# Patient Record
Sex: Male | Born: 1955
Health system: Southern US, Community
[De-identification: ages and names within clinical notes are randomized; demographics above are authoritative.]

## PROBLEM LIST (undated history)

## (undated) DIAGNOSIS — Z8679 Personal history of other diseases of the circulatory system: Secondary | ICD-10-CM

## (undated) DIAGNOSIS — H269 Unspecified cataract: Secondary | ICD-10-CM

## (undated) DIAGNOSIS — I739 Peripheral vascular disease, unspecified: Secondary | ICD-10-CM

## (undated) DIAGNOSIS — J449 Chronic obstructive pulmonary disease, unspecified: Secondary | ICD-10-CM

## (undated) DIAGNOSIS — D128 Benign neoplasm of rectum: Secondary | ICD-10-CM

## (undated) DIAGNOSIS — I10 Essential (primary) hypertension: Secondary | ICD-10-CM

## (undated) DIAGNOSIS — R011 Cardiac murmur, unspecified: Secondary | ICD-10-CM

## (undated) DIAGNOSIS — G4730 Sleep apnea, unspecified: Secondary | ICD-10-CM

## (undated) DIAGNOSIS — I779 Disorder of arteries and arterioles, unspecified: Secondary | ICD-10-CM

## (undated) DIAGNOSIS — E785 Hyperlipidemia, unspecified: Secondary | ICD-10-CM

## (undated) DIAGNOSIS — R0600 Dyspnea, unspecified: Secondary | ICD-10-CM

## (undated) DIAGNOSIS — I2510 Atherosclerotic heart disease of native coronary artery without angina pectoris: Secondary | ICD-10-CM

## (undated) DIAGNOSIS — A048 Other specified bacterial intestinal infections: Secondary | ICD-10-CM

## (undated) DIAGNOSIS — I509 Heart failure, unspecified: Secondary | ICD-10-CM

## (undated) HISTORY — DX: Unspecified cataract: H26.9

## (undated) HISTORY — DX: Other specified bacterial intestinal infections: A04.8

## (undated) HISTORY — DX: Personal history of other diseases of the circulatory system: Z86.79

## (undated) HISTORY — DX: Essential (primary) hypertension: I10

## (undated) HISTORY — DX: Benign neoplasm of rectum: D12.8

## (undated) HISTORY — DX: Hyperlipidemia, unspecified: E78.5

---

## 2010-01-19 HISTORY — PX: COLONOSCOPY: SHX174

## 2010-04-20 DIAGNOSIS — D128 Benign neoplasm of rectum: Secondary | ICD-10-CM

## 2010-04-20 HISTORY — DX: Benign neoplasm of rectum: D12.8

## 2010-04-21 ENCOUNTER — Telehealth: Payer: Self-pay

## 2010-04-21 NOTE — Telephone Encounter (Signed)
Gastroenterology Pre-Procedure Form  Request Date: 04/08/2010  Requesting Physician: [facility]     PATIENT INFORMATION:  Christopher Lang is a 55 y.o., male (DOB=[birth year]).  PROCEDURE: Procedure(s) requested: colonoscopy Procedure Reason: screening for colon cancer  PATIENT REVIEW QUESTIONS: The patient reports the following:   1. Diabetes Melitis: no 2. Joint replacements in the past 12 months: no 3. Major health problems in the past 3 months: no 4. Has an artificial valve or MVP:no 5. Has been advised in past to take antibiotics in advance of a procedure like teeth cleaning: no}    MEDICATIONS & ALLERGIES:    Patient reports the following regarding taking any blood thinners:   Plavix? no Aspirin?no Coumadin?  no  Patient confirms/reports the following medications:  Current Outpatient Prescriptions  Medication Sig Dispense Refill  . fish oil-omega-3 fatty acids 1000 MG capsule Take 1,000 mg by mouth daily.        Marland Kitchen lisinopril-hydrochlorothiazide (PRINZIDE,ZESTORETIC) 20-25 MG per tablet Take 1 tablet by mouth daily.          Patient confirms/reports the following allergies:  No Known Allergies  Patient is appropriate to schedule for requested procedure(s): yes  AUTHORIZATION INFORMATION Primary Insurance:  ID #:   Group #:  Pre-Cert / Auth required:  Pre-Cert / Auth #:  Secondary Insurance:,  ID #: Group #: Pre-Cert / Auth required: Pre-Cert / Auth #:  No orders of the defined types were placed in this encounter.    SCHEDULE INFORMATION: Procedure has been scheduled as follows:  Date:, Time: Location:  This Gastroenterology Pre-Precedure Form is being routed to the following provider(s) for review: Jonette Eva, MD

## 2010-04-22 NOTE — Telephone Encounter (Signed)
halflytely. 

## 2010-04-22 NOTE — Telephone Encounter (Signed)
Trilyte ok

## 2010-04-28 ENCOUNTER — Encounter: Payer: Self-pay | Admitting: Gastroenterology

## 2010-04-28 ENCOUNTER — Other Ambulatory Visit: Payer: Self-pay

## 2010-04-28 DIAGNOSIS — Z139 Encounter for screening, unspecified: Secondary | ICD-10-CM

## 2010-04-29 ENCOUNTER — Encounter: Payer: Self-pay | Admitting: Gastroenterology

## 2010-04-29 ENCOUNTER — Other Ambulatory Visit: Payer: Self-pay | Admitting: Gastroenterology

## 2010-04-29 ENCOUNTER — Ambulatory Visit (HOSPITAL_COMMUNITY)
Admission: RE | Admit: 2010-04-29 | Discharge: 2010-04-29 | Disposition: A | Discharge status: Home / Self Care | Payer: Self-pay | Source: Ambulatory Visit | Attending: Gastroenterology | Admitting: Gastroenterology

## 2010-04-29 DIAGNOSIS — D128 Benign neoplasm of rectum: Secondary | ICD-10-CM | POA: Insufficient documentation

## 2010-04-29 DIAGNOSIS — D126 Benign neoplasm of colon, unspecified: Secondary | ICD-10-CM | POA: Insufficient documentation

## 2010-04-29 DIAGNOSIS — Z79899 Other long term (current) drug therapy: Secondary | ICD-10-CM | POA: Insufficient documentation

## 2010-04-29 DIAGNOSIS — K648 Other hemorrhoids: Secondary | ICD-10-CM | POA: Insufficient documentation

## 2010-04-29 DIAGNOSIS — E785 Hyperlipidemia, unspecified: Secondary | ICD-10-CM | POA: Insufficient documentation

## 2010-04-29 DIAGNOSIS — Z1211 Encounter for screening for malignant neoplasm of colon: Secondary | ICD-10-CM

## 2010-04-29 DIAGNOSIS — I10 Essential (primary) hypertension: Secondary | ICD-10-CM | POA: Insufficient documentation

## 2010-04-29 DIAGNOSIS — D129 Benign neoplasm of anus and anal canal: Secondary | ICD-10-CM

## 2010-05-29 ENCOUNTER — Telehealth: Payer: Self-pay | Admitting: Gastroenterology

## 2010-05-29 NOTE — Op Note (Signed)
NAME:  Christopher Lang, Christopher Lang NO.:  [id number]  MEDICAL RECORD NO.:  [id number]           PATIENT TYPE:  O  LOCATION:  DAYP                          FACILITY:  [facility]  PHYSICIAN:  Jonette Eva, M.D.     DATE OF BIRTH:  1955-09-15  DATE OF PROCEDURE:  04/29/2010 DATE OF DISCHARGE:                              OPERATIVE REPORT   REFERRING PROVIDER:  [facility] Department.  PROCEDURE:  Colonoscopy with cold forceps, cold snare, and snare cautery polypectomy.  INDICATION FOR EXAM:  [name] is a 55 year old male who presents for average risk colon cancer screening.  FINDINGS: 1. The patient had 13 polyps removed.  The polyps varied in size from     1.5-cm to 3-mm.  They were removed using cold snare and cold     forceps and snare cautery. 2. One 1.5-cm sessile rectal polyp removed via snare cautery.  The     patient was tattooed with SPOT. 3. Two 8-mm to 1.2-cm polyps removed in the sigmoid colon via snare     cautery. 4. Remaining polyps are one 6-mm sessile transverse, mid transverse     colon polyp removed via snare cautery.  Remaining polyps removed     via cold forceps and snare cautery. 5. One 8-mm sessile ascending colon polyp removed via snare cautery.     Otherwise; no masses, inflammatory changes, diverticular AVMs     appreciated. 6. Small internal hemorrhoids.  Otherwise, normal retroflexed view of     the rectum.  DIAGNOSES: 1. Multiple colon polyps removed. 2. Small internal hemorrhoids.  RECOMMENDATIONS: 1. All first-degree relatives need a colonoscopy at age 70 and then     every 5 years. 2. He should follow a high-fiber diet.  He is given a handout on high-     fiber diet, polyps and hemorrhoids. 3. We will call him with the results of his polypectomy.  Once the     pathology is known then, we will determine his colonoscopy     interval.  He should never go more than 5 years without having a     colonoscopy or his colonoscopy  should be at a maximum every 5     years. 4. No aspirin, NSAIDs or anticoagulation for 7 days.  MEDICATIONS: 1. Demerol 125 mg IV. 2. Versed 6 mg IV.  PROCEDURE TECHNIQUE:  Physical exam was performed.  Informed consent was obtained from the patient after explaining the benefits, risks and alternatives to the procedure.  The patient was connected to the monitor and placed in the left lateral position.  Continuous oxygen was provided by nasal cannula and IV medicine administered through an indwelling cannula.  After administration of sedation and rectal exam, the patient's rectum was intubated and the scope was advanced under direct visualization to the cecum.  The scope was removed slowly by carefully examining the color, texture, anatomy and integrity of the mucosa on the way out.  The patient was recovered in endoscopy and discharged home in satisfactory condition.  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     SF/MEDQ  D:  04/29/2010  T:  04/30/2010  Job:  [id number]  cc:   [facility] Department  Electronically Signed by Jonette Eva M.D. on 05/29/2010 04:56:34 PM

## 2010-05-29 NOTE — Telephone Encounter (Signed)
Please call pt. His path report shows an advanced adenoma. TCS in 3 years. High fiber diet. All first dgree relatives need TCS at age 57 and then every 5 years.

## 2010-05-30 NOTE — Telephone Encounter (Signed)
L/M to call.

## 2010-05-30 NOTE — Telephone Encounter (Signed)
Pt informed

## 2010-06-02 NOTE — Telephone Encounter (Signed)
Reminder in epic °

## 2013-10-31 ENCOUNTER — Emergency Department (HOSPITAL_COMMUNITY)
Admission: EM | Admit: 2013-10-31 | Discharge: 2013-11-01 | Disposition: A | Discharge status: Home / Self Care | Payer: Self-pay | Attending: Emergency Medicine | Admitting: Emergency Medicine

## 2013-10-31 ENCOUNTER — Emergency Department (HOSPITAL_COMMUNITY): Payer: Self-pay

## 2013-10-31 ENCOUNTER — Encounter (HOSPITAL_COMMUNITY): Payer: Self-pay | Admitting: Emergency Medicine

## 2013-10-31 DIAGNOSIS — Z79899 Other long term (current) drug therapy: Secondary | ICD-10-CM | POA: Insufficient documentation

## 2013-10-31 DIAGNOSIS — I10 Essential (primary) hypertension: Secondary | ICD-10-CM | POA: Insufficient documentation

## 2013-10-31 DIAGNOSIS — R42 Dizziness and giddiness: Secondary | ICD-10-CM | POA: Insufficient documentation

## 2013-10-31 DIAGNOSIS — R51 Headache: Secondary | ICD-10-CM | POA: Insufficient documentation

## 2013-10-31 DIAGNOSIS — E780 Pure hypercholesterolemia: Secondary | ICD-10-CM | POA: Insufficient documentation

## 2013-10-31 DIAGNOSIS — R55 Syncope and collapse: Secondary | ICD-10-CM | POA: Insufficient documentation

## 2013-10-31 DIAGNOSIS — Z720 Tobacco use: Secondary | ICD-10-CM | POA: Insufficient documentation

## 2013-10-31 LAB — CBC
HCT: 41.5 % (ref 39.0–52.0)
Hemoglobin: 14.8 g/dL (ref 13.0–17.0)
MCH: 34.1 pg — ABNORMAL HIGH (ref 26.0–34.0)
MCHC: 35.7 g/dL (ref 30.0–36.0)
MCV: 95.6 fL (ref 78.0–100.0)
Platelets: 194 10*3/uL (ref 150–400)
RBC: 4.34 MIL/uL (ref 4.22–5.81)
RDW: 11.9 % (ref 11.5–15.5)
WBC: 9.8 10*3/uL (ref 4.0–10.5)

## 2013-10-31 LAB — URINALYSIS, ROUTINE W REFLEX MICROSCOPIC
Bilirubin Urine: NEGATIVE
Glucose, UA: NEGATIVE mg/dL
Hgb urine dipstick: NEGATIVE
Ketones, ur: NEGATIVE mg/dL
Leukocytes, UA: NEGATIVE
Nitrite: NEGATIVE
Protein, ur: NEGATIVE mg/dL
Specific Gravity, Urine: 1.015 (ref 1.005–1.030)
Urobilinogen, UA: 1 mg/dL (ref 0.0–1.0)
pH: 6.5 (ref 5.0–8.0)

## 2013-10-31 LAB — BASIC METABOLIC PANEL
Anion gap: 14 (ref 5–15)
BUN: 11 mg/dL (ref 6–23)
CO2: 24 mEq/L (ref 19–32)
Calcium: 9.9 mg/dL (ref 8.4–10.5)
Chloride: 97 mEq/L (ref 96–112)
Creatinine, Ser: 0.85 mg/dL (ref 0.50–1.35)
GFR calc Af Amer: >90 mL/min (ref >90)
GFR calc non Af Amer: >90 mL/min (ref >90)
Glucose, Bld: 130 mg/dL — ABNORMAL HIGH (ref 70–99)
Potassium: 3.9 mEq/L (ref 3.7–5.3)
Sodium: 135 mEq/L — ABNORMAL LOW (ref 137–147)

## 2013-10-31 LAB — CBG MONITORING, ED: Glucose-Capillary: 126 mg/dL — ABNORMAL HIGH (ref 70–99)

## 2013-10-31 NOTE — ED Provider Notes (Signed)
CSN: [id number]     Arrival date & time 10/31/13  2018 History  This chart was scribed for Veryl Speak, MD by Lowella Petties, ED Scribe. The patient was seen in room [facility]. Patient's care was started at 10:59 PM.   Chief Complaint  Patient presents with  . Weakness   Patient is a 58 y.o. male presenting with weakness. The history is provided by the patient. No language interpreter was used.  Weakness This is a recurrent problem. The current episode started 3 to 5 hours ago. The problem has been gradually improving. Associated symptoms include headaches. Nothing aggravates the symptoms. Nothing relieves the symptoms. He has tried nothing for the symptoms.   HPI Comments: Christopher Lang is a 58 y.o. male who presents to the Emergency Department complaining of generalized weakness and dizziness earlier this evening upon standing up. He reports that he nearly fell due to his lightheadedness, and he felt like he almost passed out. He states that the episode lasted approximately 6 minutes. He reports an associated headache after the incident. He reports similar symptoms last year in December. He reports mild acid reflux like burning and slight SOB. He denies head or neck pain. He denies history of MI or CABG.   Past Medical History  Diagnosis Date  . Hypertension   . High cholesterol    History reviewed. No pertinent past surgical history. History reviewed. No pertinent family history. History  Substance Use Topics  . Smoking status: Current Every Day Smoker -- 1.00 packs/day for 25 years    Types: Cigarettes  . Smokeless tobacco: Not on file  . Alcohol Use: 12.0 oz/week    20 Cans of beer per week     Comment: 2-3 cans beer/day    Review of Systems  Neurological: Positive for weakness and headaches.  A complete 10 system review of systems was obtained and all systems are negative except as noted in the HPI and PMH.   Allergies  Review of patient's allergies indicates no known  allergies.  Home Medications   Prior to Admission medications   Medication Sig Start Date End Date Taking? Authorizing Provider  fish oil-omega-3 fatty acids 1000 MG capsule Take 1,000 mg by mouth daily.      Historical Provider, MD  lisinopril-hydrochlorothiazide (PRINZIDE,ZESTORETIC) 20-25 MG per tablet Take 1 tablet by mouth daily.      Historical Provider, MD   Triage Vitals: BP 160/69  Pulse 72  Temp(Src) 97.7 F (36.5 C) (Oral)  Resp 18  Ht 6' (1.829 m)  Wt 211 lb (95.709 kg)  BMI 28.61 kg/m2  SpO2 100% Physical Exam  Nursing note and vitals reviewed. Constitutional: He is oriented to person, place, and time. He appears well-developed and well-nourished. No distress.  HENT:  Head: Normocephalic and atraumatic.  Mouth/Throat: Oropharynx is clear and moist.  Eyes: Conjunctivae and EOM are normal. Pupils are equal, round, and reactive to light.  Neck: Normal range of motion. Neck supple. No tracheal deviation present.  Cardiovascular: Normal rate, regular rhythm and normal heart sounds.   No murmur heard. Pulmonary/Chest: Effort normal and breath sounds normal. No respiratory distress.  Abdominal: Soft. Bowel sounds are normal.  Musculoskeletal: Normal range of motion.  Neurological: He is alert and oriented to person, place, and time. No cranial nerve deficit. He exhibits normal muscle tone. Coordination normal.  Skin: Skin is warm and dry.  Psychiatric: He has a normal mood and affect. His behavior is normal.    ED Course  Procedures (including critical care time) DIAGNOSTIC STUDIES: Oxygen Saturation is 100% on room air, normal by my interpretation.    COORDINATION OF CARE: 11:05 PM-Discussed treatment plan which includes head CT-scan with pt at bedside and pt agreed to plan.   Labs Review Labs Reviewed  CBC - Abnormal; Notable for the following:    MCH 34.1 (*)    All other components within normal limits  BASIC METABOLIC PANEL - Abnormal; Notable for the  following:    Sodium 135 (*)    Glucose, Bld 130 (*)    All other components within normal limits  CBG MONITORING, ED - Abnormal; Notable for the following:    Glucose-Capillary 126 (*)    All other components within normal limits  URINALYSIS, ROUTINE W REFLEX MICROSCOPIC    Imaging Review No results found.   Date: 11/01/2013  Rate: 61  Rhythm: normal sinus rhythm  QRS Axis: normal  Intervals: normal  ST/T Wave abnormalities: normal  Conduction Disutrbances:none  Narrative Interpretation:   Old EKG Reviewed: unchanged    MDM   Final diagnoses:  None    Patient is a 58 year old male who presents after a near syncopal episode that lasted for approximately 5 minutes. This resolved and he now feels fine. He did have what he reports as a "bad headache" towards the end of the episode hours feeling much better now. Neurologic exam is nonfocal, vital signs are stable, and workup including laboratory studies and CT scan of the head are unremarkable. He is also found to be in a normal sinus rhythm. I suspect some sort of vasovagal incident that caused the symptoms. He appears appropriate for discharge, to followup as needed if his symptoms recur.  I personally performed the services described in this documentation, which was scribed in my presence. The recorded information has been reviewed and is accurate.       Veryl Speak, MD 11/01/13 [phone]

## 2013-10-31 NOTE — ED Notes (Signed)
Pt reports generalized weakness, hot flashes, and blurred vision while outside earlier this evening. Pt denies LOC. Pt denies weakness at the moment but reports "bad headache." Pt states that he has experienced similar weakness within the last year and was told to return to the ED if it were to occur again.

## 2013-11-01 NOTE — Discharge Instructions (Signed)
Near-Syncope Near-syncope (commonly known as near fainting) is sudden weakness, dizziness, or feeling like you might pass out. During an episode of near-syncope, you may also develop pale skin, have tunnel vision, or feel sick to your stomach (nauseous). Near-syncope may occur when getting up after sitting or while standing for a long time. It is caused by a sudden decrease in blood flow to the brain. This decrease can result from various causes or triggers, most of which are not serious. However, because near-syncope can sometimes be a sign of something serious, a medical evaluation is required. The specific cause is often not determined. HOME CARE INSTRUCTIONS  Monitor your condition for any changes. The following actions may help to alleviate any discomfort you are experiencing:  Have someone stay with you until you feel stable.  Lie down right away and prop your feet up if you start feeling like you might faint. Breathe deeply and steadily. Wait until all the symptoms have passed. Most of these episodes last only a few minutes. You may feel tired for several hours.   Drink enough fluids to keep your urine clear or pale yellow.   If you are taking blood pressure or heart medicine, get up slowly when seated or lying down. Take several minutes to sit and then stand. This can reduce dizziness.  Follow up with your health care provider as directed. SEEK IMMEDIATE MEDICAL CARE IF:   You have a severe headache.   You have unusual pain in the chest, abdomen, or back.   You are bleeding from the mouth or rectum, or you have black or tarry stool.   You have an irregular or very fast heartbeat.   You have repeated fainting or have seizure-like jerking during an episode.   You faint when sitting or lying down.   You have confusion.   You have difficulty walking.   You have severe weakness.   You have vision problems.  MAKE SURE YOU:   Understand these instructions.  Will  watch your condition.  Will get help right away if you are not doing well or get worse. Document Released: 01/05/2005 Document Revised: 01/10/2013 Document Reviewed: 06/10/2012 [employer] Patient Information 2015 [employer]. This information is not intended to replace advice given to you by your health care provider. Make sure you discuss any questions you have with your health care provider.  

## 2015-04-10 ENCOUNTER — Encounter: Payer: Self-pay | Admitting: Gastroenterology

## 2015-11-26 NOTE — Patient Instructions (Signed)
Your procedure is scheduled on: 12/02/2015  Report to [facility] at  55   AM.  Call this number if you have problems the morning of surgery: [phone]   Do not eat food or drink liquids :After Midnight.      Take these medicines the morning of surgery with A SIP OF WATER: amlodipine, losartan.   Do not wear jewelry, make-up or nail polish.  Do not wear lotions, powders, or perfumes. You may wear deodorant.  Do not shave 48 hours prior to surgery.  Do not bring valuables to the hospital.  Contacts, dentures or bridgework may not be worn into surgery.  Leave suitcase in the car. After surgery it may be brought to your room.  For patients admitted to the hospital, checkout time is 11:00 AM the day of discharge.   Patients discharged the day of surgery will not be allowed to drive home.  :     Please read over the following fact sheets that you were given: Coughing and Deep Breathing, Surgical Site Infection Prevention, Anesthesia Post-op Instructions and Care and Recovery After Surgery    Cataract A cataract is a clouding of the lens of the eye. When a lens becomes cloudy, vision is reduced based on the degree and nature of the clouding. Many cataracts reduce vision to some degree. Some cataracts make people more near-sighted as they develop. Other cataracts increase glare. Cataracts that are ignored and become worse can sometimes look white. The white color can be seen through the pupil. CAUSES   Aging. However, cataracts may occur at any age, even in newborns.   Certain drugs.   Trauma to the eye.   Certain diseases such as diabetes.   Specific eye diseases such as chronic inflammation inside the eye or a sudden attack of a rare form of glaucoma.   Inherited or acquired medical problems.  SYMPTOMS   Gradual, progressive drop in vision in the affected eye.   Severe, rapid visual loss. This most often happens when trauma is the cause.  DIAGNOSIS  To detect a cataract, an eye  doctor examines the lens. Cataracts are best diagnosed with an exam of the eyes with the pupils enlarged (dilated) by drops.  TREATMENT  For an early cataract, vision may improve by using different eyeglasses or stronger lighting. If that does not help your vision, surgery is the only effective treatment. A cataract needs to be surgically removed when vision loss interferes with your everyday activities, such as driving, reading, or watching TV. A cataract may also have to be removed if it prevents examination or treatment of another eye problem. Surgery removes the cloudy lens and usually replaces it with a substitute lens (intraocular lens, IOL).  At a time when both you and your doctor agree, the cataract will be surgically removed. If you have cataracts in both eyes, only one is usually removed at a time. This allows the operated eye to heal and be out of danger from any possible problems after surgery (such as infection or poor wound healing). In rare cases, a cataract may be doing damage to your eye. In these cases, your caregiver may advise surgical removal right away. The vast majority of people who have cataract surgery have better vision afterward. HOME CARE INSTRUCTIONS  If you are not planning surgery, you may be asked to do the following:  Use different eyeglasses.   Use stronger or brighter lighting.   Ask your eye doctor about reducing your medicine  dose or changing medicines if it is thought that a medicine caused your cataract. Changing medicines does not make the cataract go away on its own.   Become familiar with your surroundings. Poor vision can lead to injury. Avoid bumping into things on the affected side. You are at a higher risk for tripping or falling.   Exercise extreme care when driving or operating machinery.   Wear sunglasses if you are sensitive to bright light or experiencing problems with glare.  SEEK IMMEDIATE MEDICAL CARE IF:   You have a worsening or sudden  vision loss.   You notice redness, swelling, or increasing pain in the eye.   You have a fever.  Document Released: 01/05/2005 Document Revised: 12/25/2010 Document Reviewed: 08/29/2010 [facility] Patient Information 2012 [place].PATIENT INSTRUCTIONS POST-ANESTHESIA  IMMEDIATELY FOLLOWING SURGERY:  Do not drive or operate machinery for the first twenty four hours after surgery.  Do not make any important decisions for twenty four hours after surgery or while taking narcotic pain medications or sedatives.  If you develop intractable nausea and vomiting or a severe headache please notify your doctor immediately.  FOLLOW-UP:  Please make an appointment with your surgeon as instructed. You do not need to follow up with anesthesia unless specifically instructed to do so.  WOUND CARE INSTRUCTIONS (if applicable):  Keep a dry clean dressing on the anesthesia/puncture wound site if there is drainage.  Once the wound has quit draining you may leave it open to air.  Generally you should leave the bandage intact for twenty four hours unless there is drainage.  If the epidural site drains for more than 36-48 hours please call the anesthesia department.  QUESTIONS?:  Please feel free to call your physician or the hospital operator if you have any questions, and they will be happy to assist you.

## 2015-11-28 ENCOUNTER — Other Ambulatory Visit: Payer: Self-pay

## 2015-11-28 ENCOUNTER — Encounter (HOSPITAL_COMMUNITY): Payer: Self-pay

## 2015-11-28 ENCOUNTER — Encounter (HOSPITAL_COMMUNITY)
Admission: RE | Admit: 2015-11-28 | Discharge: 2015-11-28 | Disposition: A | Discharge status: Home / Self Care | Payer: PPO | Source: Ambulatory Visit | Attending: Ophthalmology | Admitting: Ophthalmology

## 2015-11-28 DIAGNOSIS — I10 Essential (primary) hypertension: Secondary | ICD-10-CM | POA: Insufficient documentation

## 2015-11-28 DIAGNOSIS — Z01818 Encounter for other preprocedural examination: Secondary | ICD-10-CM | POA: Diagnosis present

## 2015-11-28 DIAGNOSIS — R9431 Abnormal electrocardiogram [ECG] [EKG]: Secondary | ICD-10-CM | POA: Insufficient documentation

## 2015-11-28 DIAGNOSIS — Z01812 Encounter for preprocedural laboratory examination: Secondary | ICD-10-CM | POA: Insufficient documentation

## 2015-11-28 DIAGNOSIS — H269 Unspecified cataract: Secondary | ICD-10-CM | POA: Insufficient documentation

## 2015-11-28 LAB — BASIC METABOLIC PANEL
Anion gap: 7 (ref 5–15)
BUN: 9 mg/dL (ref 6–20)
CO2: 24 mmol/L (ref 22–32)
Calcium: 9.2 mg/dL (ref 8.9–10.3)
Chloride: 101 mmol/L (ref 101–111)
Creatinine, Ser: 0.8 mg/dL (ref 0.61–1.24)
GFR calc Af Amer: >60 mL/min (ref >60)
GFR calc non Af Amer: >60 mL/min (ref >60)
Glucose, Bld: 105 mg/dL — ABNORMAL HIGH (ref 65–99)
Potassium: 3.8 mmol/L (ref 3.5–5.1)
Sodium: 132 mmol/L — ABNORMAL LOW (ref 135–145)

## 2015-11-28 LAB — CBC
HCT: 43.4 % (ref 39.0–52.0)
Hemoglobin: 15.6 g/dL (ref 13.0–17.0)
MCH: 34.2 pg — ABNORMAL HIGH (ref 26.0–34.0)
MCHC: 35.9 g/dL (ref 30.0–36.0)
MCV: 95.2 fL (ref 78.0–100.0)
Platelets: 224 10*3/uL (ref 150–400)
RBC: 4.56 MIL/uL (ref 4.22–5.81)
RDW: 12.2 % (ref 11.5–15.5)
WBC: 11.3 10*3/uL — ABNORMAL HIGH (ref 4.0–10.5)

## 2015-12-02 ENCOUNTER — Observation Stay (HOSPITAL_COMMUNITY)
Admission: AD | Admit: 2015-12-02 | Discharge: 2015-12-04 | Disposition: A | Discharge status: Home / Self Care | Payer: PPO | Source: Ambulatory Visit | Attending: Internal Medicine | Admitting: Internal Medicine

## 2015-12-02 ENCOUNTER — Encounter (HOSPITAL_COMMUNITY): Payer: Self-pay | Admitting: *Deleted

## 2015-12-02 ENCOUNTER — Encounter (HOSPITAL_COMMUNITY)
Admission: AD | Disposition: A | Discharge status: Home / Self Care | Payer: Self-pay | Source: Ambulatory Visit | Attending: Emergency Medicine

## 2015-12-02 ENCOUNTER — Observation Stay (HOSPITAL_COMMUNITY): Payer: PPO

## 2015-12-02 DIAGNOSIS — E784 Other hyperlipidemia: Secondary | ICD-10-CM | POA: Diagnosis not present

## 2015-12-02 DIAGNOSIS — I10 Essential (primary) hypertension: Secondary | ICD-10-CM | POA: Diagnosis not present

## 2015-12-02 DIAGNOSIS — I4891 Unspecified atrial fibrillation: Principal | ICD-10-CM | POA: Insufficient documentation

## 2015-12-02 DIAGNOSIS — E785 Hyperlipidemia, unspecified: Secondary | ICD-10-CM | POA: Diagnosis not present

## 2015-12-02 DIAGNOSIS — J449 Chronic obstructive pulmonary disease, unspecified: Secondary | ICD-10-CM | POA: Insufficient documentation

## 2015-12-02 DIAGNOSIS — Z8249 Family history of ischemic heart disease and other diseases of the circulatory system: Secondary | ICD-10-CM | POA: Diagnosis not present

## 2015-12-02 DIAGNOSIS — G4730 Sleep apnea, unspecified: Secondary | ICD-10-CM | POA: Diagnosis not present

## 2015-12-02 DIAGNOSIS — Z720 Tobacco use: Secondary | ICD-10-CM | POA: Diagnosis present

## 2015-12-02 DIAGNOSIS — R748 Abnormal levels of other serum enzymes: Secondary | ICD-10-CM | POA: Diagnosis not present

## 2015-12-02 DIAGNOSIS — E7800 Pure hypercholesterolemia, unspecified: Secondary | ICD-10-CM | POA: Insufficient documentation

## 2015-12-02 DIAGNOSIS — E782 Mixed hyperlipidemia: Secondary | ICD-10-CM | POA: Diagnosis not present

## 2015-12-02 DIAGNOSIS — Z7982 Long term (current) use of aspirin: Secondary | ICD-10-CM | POA: Diagnosis not present

## 2015-12-02 DIAGNOSIS — F17210 Nicotine dependence, cigarettes, uncomplicated: Secondary | ICD-10-CM | POA: Diagnosis not present

## 2015-12-02 HISTORY — DX: Chronic obstructive pulmonary disease, unspecified: J44.9

## 2015-12-02 HISTORY — DX: Sleep apnea, unspecified: G47.30

## 2015-12-02 LAB — CBC
HCT: 46.1 % (ref 39.0–52.0)
Hemoglobin: 16.1 g/dL (ref 13.0–17.0)
MCH: 33.9 pg (ref 26.0–34.0)
MCHC: 34.9 g/dL (ref 30.0–36.0)
MCV: 97.1 fL (ref 78.0–100.0)
Platelets: 258 10*3/uL (ref 150–400)
RBC: 4.75 MIL/uL (ref 4.22–5.81)
RDW: 12.4 % (ref 11.5–15.5)
WBC: 11.5 10*3/uL — ABNORMAL HIGH (ref 4.0–10.5)

## 2015-12-02 LAB — CBC WITH DIFFERENTIAL/PLATELET
Basophils Absolute: 0 10*3/uL (ref 0.0–0.1)
Basophils Relative: 0 %
Eosinophils Absolute: 0.1 10*3/uL (ref 0.0–0.7)
Eosinophils Relative: 1 %
HCT: 48.1 % (ref 39.0–52.0)
Hemoglobin: 16.9 g/dL (ref 13.0–17.0)
Lymphocytes Relative: 28 %
Lymphs Abs: 3.7 10*3/uL (ref 0.7–4.0)
MCH: 33.9 pg (ref 26.0–34.0)
MCHC: 35.1 g/dL (ref 30.0–36.0)
MCV: 96.6 fL (ref 78.0–100.0)
Monocytes Absolute: 1.3 10*3/uL — ABNORMAL HIGH (ref 0.1–1.0)
Monocytes Relative: 9 %
Neutro Abs: 8.4 10*3/uL — ABNORMAL HIGH (ref 1.7–7.7)
Neutrophils Relative %: 62 %
Platelets: 266 10*3/uL (ref 150–400)
RBC: 4.98 MIL/uL (ref 4.22–5.81)
RDW: 12.5 % (ref 11.5–15.5)
WBC: 13.5 10*3/uL — ABNORMAL HIGH (ref 4.0–10.5)

## 2015-12-02 LAB — COMPREHENSIVE METABOLIC PANEL
ALT: 27 U/L (ref 17–63)
AST: 29 U/L (ref 15–41)
Albumin: 4.3 g/dL (ref 3.5–5.0)
Alkaline Phosphatase: 94 U/L (ref 38–126)
Anion gap: 8 (ref 5–15)
BUN: 11 mg/dL (ref 6–20)
CO2: 27 mmol/L (ref 22–32)
Calcium: 9.6 mg/dL (ref 8.9–10.3)
Chloride: 102 mmol/L (ref 101–111)
Creatinine, Ser: 0.98 mg/dL (ref 0.61–1.24)
GFR calc Af Amer: >60 mL/min (ref >60)
GFR calc non Af Amer: >60 mL/min (ref >60)
Glucose, Bld: 116 mg/dL — ABNORMAL HIGH (ref 65–99)
Potassium: 4 mmol/L (ref 3.5–5.1)
Sodium: 137 mmol/L (ref 135–145)
Total Bilirubin: 1.1 mg/dL (ref 0.3–1.2)
Total Protein: 7.5 g/dL (ref 6.5–8.1)

## 2015-12-02 LAB — T4, FREE: Free T4: 0.96 ng/dL (ref 0.61–1.12)

## 2015-12-02 LAB — CREATININE, SERUM
Creatinine, Ser: 1.01 mg/dL (ref 0.61–1.24)
GFR calc Af Amer: >60 mL/min (ref >60)
GFR calc non Af Amer: >60 mL/min (ref >60)

## 2015-12-02 LAB — MRSA PCR SCREENING: MRSA by PCR: NEGATIVE

## 2015-12-02 LAB — TROPONIN I
Troponin I: 0.03 ng/mL (ref <0.03)
Troponin I: <0.03 ng/mL (ref <0.03)
Troponin I: <0.03 ng/mL (ref <0.03)

## 2015-12-02 LAB — TSH: TSH: 1.561 u[IU]/mL (ref 0.350–4.500)

## 2015-12-02 LAB — MAGNESIUM: Magnesium: 1.8 mg/dL (ref 1.7–2.4)

## 2015-12-02 SURGERY — CANCELLED PROCEDURE
Site: Eye | Laterality: Right

## 2015-12-02 MED ORDER — ASPIRIN EC 81 MG PO TBEC
81.0000 mg | DELAYED_RELEASE_TABLET | Freq: Every day | ORAL | Status: DC
  Administered 2015-12-02 – 2015-12-04 (×3): 81 mg via ORAL
  Filled 2015-12-02 (×3): qty 1

## 2015-12-02 MED ORDER — LIDOCAINE HCL 3.5 % OP GEL: 1.0000 "application " | Freq: Once | OPHTHALMIC | Status: DC

## 2015-12-02 MED ORDER — DILTIAZEM HCL 100 MG IV SOLR
5.0000 mg/h | INTRAVENOUS | Status: DC
  Administered 2015-12-02: 5 mg/h via INTRAVENOUS
  Filled 2015-12-02: qty 100

## 2015-12-02 MED ORDER — ENOXAPARIN SODIUM 40 MG/0.4ML ~~LOC~~ SOLN
40.0000 mg | SUBCUTANEOUS | Status: DC
  Administered 2015-12-02 – 2015-12-03 (×2): 40 mg via SUBCUTANEOUS
  Filled 2015-12-02 (×2): qty 0.4

## 2015-12-02 MED ORDER — INFLUENZA VAC SPLIT QUAD 0.5 ML IM SUSY
0.5000 mL | PREFILLED_SYRINGE | INTRAMUSCULAR | Status: AC
  Administered 2015-12-03: 0.5 mL via INTRAMUSCULAR
  Filled 2015-12-02: qty 0.5

## 2015-12-02 MED ORDER — PHENYLEPHRINE HCL 2.5 % OP SOLN
1.0000 [drp] | OPHTHALMIC | Status: DC
  Administered 2015-12-02 (×2): 1 [drp] via OPHTHALMIC

## 2015-12-02 MED ORDER — HYDROCODONE-ACETAMINOPHEN 5-325 MG PO TABS: 1.0000 | ORAL_TABLET | ORAL | Status: DC | PRN

## 2015-12-02 MED ORDER — ACETAMINOPHEN 325 MG PO TABS: 650.0000 mg | ORAL_TABLET | Freq: Four times a day (QID) | ORAL | Status: DC | PRN

## 2015-12-02 MED ORDER — VITAMIN D 1000 UNITS PO TABS
1000.0000 [IU] | ORAL_TABLET | Freq: Every day | ORAL | Status: DC
  Administered 2015-12-02 – 2015-12-04 (×3): 1000 [IU] via ORAL
  Filled 2015-12-02 (×4): qty 1

## 2015-12-02 MED ORDER — ACETAMINOPHEN 650 MG RE SUPP: 650.0000 mg | Freq: Four times a day (QID) | RECTAL | Status: DC | PRN

## 2015-12-02 MED ORDER — ONDANSETRON HCL 4 MG/2ML IJ SOLN: 4.0000 mg | Freq: Four times a day (QID) | INTRAMUSCULAR | Status: DC | PRN

## 2015-12-02 MED ORDER — POTASSIUM CHLORIDE IN NACL 20-0.9 MEQ/L-% IV SOLN
INTRAVENOUS | Status: DC
  Administered 2015-12-02 – 2015-12-03 (×2): via INTRAVENOUS

## 2015-12-02 MED ORDER — DILTIAZEM HCL 30 MG PO TABS
30.0000 mg | ORAL_TABLET | Freq: Three times a day (TID) | ORAL | Status: DC
  Administered 2015-12-02: 30 mg via ORAL
  Filled 2015-12-02: qty 1

## 2015-12-02 MED ORDER — SODIUM CHLORIDE 0.9% FLUSH
3.0000 mL | Freq: Two times a day (BID) | INTRAVENOUS | Status: DC
Start: 2015-12-02 — End: 2015-12-04
  Administered 2015-12-02 – 2015-12-03 (×3): 3 mL via INTRAVENOUS

## 2015-12-02 MED ORDER — SIMVASTATIN 20 MG PO TABS
20.0000 mg | ORAL_TABLET | Freq: Every day | ORAL | Status: DC
  Administered 2015-12-02: 20 mg via ORAL
  Filled 2015-12-02: qty 1

## 2015-12-02 MED ORDER — CYCLOPENTOLATE-PHENYLEPHRINE 0.2-1 % OP SOLN
1.0000 [drp] | OPHTHALMIC | Status: DC
  Administered 2015-12-02 (×2): 1 [drp] via OPHTHALMIC

## 2015-12-02 MED ORDER — DILTIAZEM LOAD VIA INFUSION
10.0000 mg | Freq: Once | INTRAVENOUS | Status: AC
  Administered 2015-12-02: 10 mg via INTRAVENOUS
  Filled 2015-12-02: qty 10

## 2015-12-02 MED ORDER — TETRACAINE HCL 0.5 % OP SOLN
1.0000 [drp] | OPHTHALMIC | Status: DC
  Administered 2015-12-02 (×2): 1 [drp] via OPHTHALMIC

## 2015-12-02 MED ORDER — ONDANSETRON HCL 4 MG PO TABS: 4.0000 mg | ORAL_TABLET | Freq: Four times a day (QID) | ORAL | Status: DC | PRN

## 2015-12-02 MED ORDER — OMEGA-3-ACID ETHYL ESTERS 1 G PO CAPS
1000.0000 mg | ORAL_CAPSULE | Freq: Every day | ORAL | Status: DC
  Administered 2015-12-02 – 2015-12-04 (×3): 1000 mg via ORAL
  Filled 2015-12-02 (×3): qty 1

## 2015-12-02 SURGICAL SUPPLY — 20 items
CAPSULAR TENSION RING-AMO (OPHTHALMIC RELATED) IMPLANT
CLOTH BEACON ORANGE TIMEOUT ST (SAFETY) IMPLANT
EYE SHIELD UNIVERSAL CLEAR (GAUZE/BANDAGES/DRESSINGS) IMPLANT
GLOVE BIOGEL PI IND STRL 6.5 (GLOVE) IMPLANT
GLOVE BIOGEL PI IND STRL 7.0 (GLOVE) IMPLANT
GLOVE BIOGEL PI INDICATOR 6.5 (GLOVE)
GLOVE BIOGEL PI INDICATOR 7.0 (GLOVE)
GLOVE EXAM NITRILE LRG STRL (GLOVE) IMPLANT
GLOVE EXAM NITRILE MD LF STRL (GLOVE) IMPLANT
KIT VITRECTOMY (OPHTHALMIC RELATED) IMPLANT
PAD ARMBOARD 7.5X6 YLW CONV (MISCELLANEOUS) IMPLANT
PROC W NO LENS (INTRAOCULAR LENS)
PROC W SPEC LENS (INTRAOCULAR LENS)
PROCESS W NO LENS (INTRAOCULAR LENS) IMPLANT
PROCESS W SPEC LENS (INTRAOCULAR LENS) IMPLANT
RETRACTOR IRIS SIGHTPATH (OPHTHALMIC RELATED) IMPLANT
RING MALYGIN (MISCELLANEOUS) IMPLANT
SYRINGE LUER LOK 1CC (MISCELLANEOUS) IMPLANT
VISCOELASTIC ADDITIONAL (OPHTHALMIC RELATED) IMPLANT
WATER STERILE IRR 250ML POUR (IV SOLUTION) IMPLANT

## 2015-12-02 NOTE — ED Notes (Signed)
Pt here from Day Surgery for evaluation of new onset A Fib.

## 2015-12-02 NOTE — ED Provider Notes (Signed)
[facility] DEPT Provider Note   CSN: [id number] Arrival date & time: 12/02/15  1011  By signing my name below, I, Higinio Plan, attest that this documentation has been prepared under the direction and in the presence of Veryl Speak, MD . Electronically Signed: Higinio Plan, Scribe. 12/02/2015. 11:04 AM.  History   Chief Complaint Chief Complaint  Patient presents with  . Atrial Fibrillation   The history is provided by the patient. No language interpreter was used.   HPI Comments: Christopher Lang is a 60 y.o. male with PMHx of HTN and HLD, who presents to the Emergency Department for an evaluation of possible A-Fib. Pt reports he arrived at [facility] for cataract surgery this morning and was redirected to the ED for an evaluation of A-Fib. He notes he was told his heart did not have a similar irregular rhythm earlier this week for his pre-admission testing. He denies sensation of palpitations, leg swelling and hx of A-Fib, cardiac catheterization, MI or stress test.   Past Medical History:  Diagnosis Date  . High cholesterol   . Hypertension    There are no active problems to display for this patient.  Past Surgical History:  Procedure Laterality Date  . NO PAST SURGERIES      Home Medications    Prior to Admission medications   Medication Sig Start Date End Date Taking? Authorizing Provider  amLODipine (NORVASC) 5 MG tablet Take 5 mg by mouth daily.   Yes Historical Provider, MD  aspirin EC 81 MG tablet Take 81 mg by mouth daily.   Yes Historical Provider, MD  cholecalciferol (VITAMIN D) 1000 units tablet Take 1,000 Units by mouth daily.   Yes Historical Provider, MD  fish oil-omega-3 fatty acids 1000 MG capsule Take 1,000 mg by mouth daily.     Yes Historical Provider, MD  losartan-hydrochlorothiazide (HYZAAR) 100-25 MG tablet Take 1 tablet by mouth daily.   Yes Historical Provider, MD  simvastatin (ZOCOR) 20 MG tablet Take 20 mg by mouth daily.   Yes Historical  Provider, MD    Family History History reviewed. No pertinent family history.  Social History Social History  Substance Use Topics  . Smoking status: Current Every Day Smoker    Packs/day: 1.00    Years: 25.00    Types: Cigarettes  . Smokeless tobacco: Never Used  . Alcohol use 12.0 oz/week    20 Cans of beer per week     Comment: 2-3 cans beer/day     Allergies   Patient has no known allergies.   Review of Systems Review of Systems  Constitutional: Negative for fever.  Cardiovascular: Negative for palpitations and leg swelling.   Physical Exam Updated Vital Signs BP 100/79 (BP Location: Right Arm)   Pulse 106   Temp 97.6 F (36.4 C) (Oral)   Resp 19   SpO2 96%   Physical Exam  Constitutional: He is oriented to person, place, and time. He appears well-developed and well-nourished.  HENT:  Head: Normocephalic and atraumatic.  Eyes: EOM are normal.  Neck: Normal range of motion.  Cardiovascular:  Irregular irregular and rapid   Pulmonary/Chest: Effort normal and breath sounds normal. No respiratory distress.  Abdominal: Soft. He exhibits no distension. There is no tenderness.  Musculoskeletal: Normal range of motion.  Neurological: He is alert and oriented to person, place, and time.  Skin: Skin is warm and dry.  Psychiatric: He has a normal mood and affect. Judgment normal.  Nursing note and vitals  reviewed.  ED Treatments / Results  Labs (all labs ordered are listed, but only abnormal results are displayed) Labs Reviewed - No data to display  EKG  EKG Interpretation None       Radiology No results found.  Procedures Procedures (including critical care time)  Medications Ordered in ED Medications  cyclopentolate-phenylephrine (CYCLOMYDRYL) 0.2-1 % ophthalmic solution 1 drop (1 drop Right Eye Given 12/02/15 0937)  phenylephrine (MYDFRIN) 2.5 % ophthalmic solution 1 drop (1 drop Right Eye Given 12/02/15 0936)  tetracaine (PONTOCAINE) 0.5 %  ophthalmic solution 1 drop (1 drop Right Eye Given 12/02/15 0943)   DIAGNOSTIC STUDIES:  Oxygen Saturation is 96% on N/C, normal by my interpretation.    COORDINATION OF CARE:  10:47 AM Discussed treatment plan with pt at bedside and pt agreed to plan.  Initial Impression / Assessment and Plan / ED Course  I have reviewed the triage vital signs and the nursing notes.  Pertinent labs & imaging results that were available during my care of the patient were reviewed by me and considered in my medical decision making (see chart for details).  Clinical Course     Patient sent from preop for evaluation of new onset A. fib. His EKG reveals atrial fibrillation with RVR with a rate in the 130s. He was started on a Cardizem drip and his rate has since improved. Laboratory studies are unremarkable, with the exception of a troponin of 0.03.  I've discussed the patient with Dr. Caryn Section who agrees to admit. He will likely require a workup with echocardiogram and cardiology consultation.  CRITICAL CARE Performed by: Veryl Speak Total critical care time: 45 minutes Critical care time was exclusive of separately billable procedures and treating other patients. Critical care was necessary to treat or prevent imminent or life-threatening deterioration. Critical care was time spent personally by me on the following activities: development of treatment plan with patient and/or surrogate as well as nursing, discussions with consultants, evaluation of patient's response to treatment, examination of patient, obtaining history from patient or surrogate, ordering and performing treatments and interventions, ordering and review of laboratory studies, ordering and review of radiographic studies, pulse oximetry and re-evaluation of patient's condition.   I personally performed the services described in this documentation, which was scribed in my presence. The recorded information has been reviewed and is accurate.    Final Clinical Impressions(s) / ED Diagnoses   Final diagnoses:  None    New Prescriptions New Prescriptions   No medications on file     Veryl Speak, MD 12/02/15 1154

## 2015-12-02 NOTE — H&P (Addendum)
History and Physical    Christopher Lang [id number] DOB: 12/09/1955 DOA: 12/02/2015  PCP: Celedonio Savage, MD   Ophthalmologist/I surgeon Dr. Yong Channel  Patient coming from: Home  Chief Complaint: Patient was found to have atrial fibrillation with RVR prior to anticipated cataract surgery.  HPI: Christopher Lang is a 60 y.o. male with medical history significant for hypertension, COPD, tobacco abuse and hyperlipidemia, who presented today for cataract surgery by Dr. Yong Channel. An EKG was ordered due to the patient's hypertension and it revealed atrial fibrillation with a rate of 113 bpm.. Apparently, the preop EKG on 11/28/15 revealed normal sinus rhythm. Patient denies current chest pain, shortness of breath, or palpitations. However, upon further questioning, he has had some palpitations on and off for a couple of weeks. He has had occasional lightheadedness. However, he denies prior shortness of breath, chest pain, nausea, vomiting, diarrhea, cough, or flulike symptoms. He also denies swelling in his legs. He has had some difficulty seeing due to cataracts. He denies any new medications. He denies illicit drug use.  ED Course: In the ED, the patient was afebrile and tachycardic with a heart rate in the 120s. His blood pressure was within normal limits. His lab data were significant for troponin I of 0.03 and WBC of 13.5. He was admitted for further evaluation and management.  Review of Systems: As per HPI otherwise 10 point review of systems negative.    Past Medical History:  Diagnosis Date  . COPD (chronic obstructive pulmonary disease) ([place])   . High cholesterol   . Hypertension   . Sleep apnea     Past Surgical History:  Procedure Laterality Date  . NO PAST SURGERIES      Social history: Patient is married. He has 2 children. He is disabled. He smokes one pack of cigarettes per day. He drugs alcohol on occasion. He denies illicit drug use.  He has a 25.00 pack-year smoking history. He  has never used smokeless tobacco. He reports that he drinks about 12.0 oz of alcohol per week .  No Known Allergies  Family History  Problem Relation Age of Onset  . CAD Mother   . High blood pressure Mother   . Leukemia Father   . CAD Sister   . High blood pressure Sister   . Diabetes Sister   . Pulmonary embolism Sister   . Deep vein thrombosis Sister   . Kidney disease Brother   . Cancer Brother     Prior to Admission medications   Medication Sig Start Date End Date Taking? Authorizing Provider  amLODipine (NORVASC) 5 MG tablet Take 5 mg by mouth daily.   Yes Historical Provider, MD  aspirin EC 81 MG tablet Take 81 mg by mouth daily.   Yes Historical Provider, MD  cholecalciferol (VITAMIN D) 1000 units tablet Take 1,000 Units by mouth daily.   Yes Historical Provider, MD  fish oil-omega-3 fatty acids 1000 MG capsule Take 1,000 mg by mouth daily.     Yes Historical Provider, MD  losartan-hydrochlorothiazide (HYZAAR) 100-25 MG tablet Take 1 tablet by mouth daily.   Yes Historical Provider, MD  simvastatin (ZOCOR) 20 MG tablet Take 20 mg by mouth daily.   Yes Historical Provider, MD    Physical Exam: Vitals:   12/02/15 1230 12/02/15 1245 12/02/15 1300 12/02/15 1341  BP: 101/71 96/74 125/68 109/70  Pulse: 99 89 94 81  Resp: 16 19 18    Temp:    97.1 F (36.2 C)  TempSrc:  Oral  SpO2: 95% 94% 93% 97%  Weight:    89.2 kg (196 lb 10.4 oz)  Height:    6' (1.829 m)      Constitutional: NAD, calm, comfortable Vitals:   12/02/15 1230 12/02/15 1245 12/02/15 1300 12/02/15 1341  BP: 101/71 96/74 125/68 109/70  Pulse: 99 89 94 81  Resp: 16 19 18    Temp:    97.1 F (36.2 C)  TempSrc:    Oral  SpO2: 95% 94% 93% 97%  Weight:    89.2 kg (196 lb 10.4 oz)  Height:    6' (1.829 m)   Eyes: PERRL, lids and conjunctivae normal ENMT: Mucous membranes are moist. Posterior pharynx clear of any exudate or lesions.Normal dentition.  Neck: normal, supple, no masses, no  thyromegaly Respiratory: clear to auscultation bilaterally, no wheezing, no crackles. Normal respiratory effort. No accessory muscle use.  Cardiovascular: Irregular, irregular. No extremity edema. 2+ pedal pulses. No carotid bruits.  Abdomen: no tenderness, no masses palpated. No hepatosplenomegaly. Bowel sounds positive.  Musculoskeletal: no clubbing / cyanosis. No joint deformity upper and lower extremities. Good ROM, no contractures. Normal muscle tone.  Skin: no rashes, lesions, ulcers. No induration Neurologic: CN 2-12 grossly intact. Sensation intact, DTR normal. Strength 5/5 in all 4.  Psychiatric: Normal judgment and insight. Alert and oriented x 3. Normal mood.     Labs on Admission: I have personally reviewed following labs and imaging studies  CBC:  Recent Labs Lab 11/28/15 1337 12/02/15 1055  WBC 11.3* 13.5*  NEUTROABS  --  8.4*  HGB 15.6 16.9  HCT 43.4 48.1  MCV 95.2 96.6  PLT 224 [id number]   Basic Metabolic Panel:  Recent Labs Lab 11/28/15 1337 12/02/15 1055  NA 132* 137  K 3.8 4.0  CL 101 102  CO2 24 27  GLUCOSE 105* 116*  BUN 9 11  CREATININE 0.80 0.98  CALCIUM 9.2 9.6  MG  --  1.8   GFR: Estimated Creatinine Clearance: 85.8 mL/min (by C-G formula based on SCr of 0.98 mg/dL). Liver Function Tests:  Recent Labs Lab 12/02/15 1055  AST 29  ALT 27  ALKPHOS 94  BILITOT 1.1  PROT 7.5  ALBUMIN 4.3   No results for input(s): LIPASE, AMYLASE in the last 168 hours. No results for input(s): AMMONIA in the last 168 hours. Coagulation Profile: No results for input(s): INR, PROTIME in the last 168 hours. Cardiac Enzymes:  Recent Labs Lab 12/02/15 1055  TROPONINI 0.03*   BNP (last 3 results) No results for input(s): PROBNP in the last 8760 hours. HbA1C: No results for input(s): HGBA1C in the last 72 hours. CBG: No results for input(s): GLUCAP in the last 168 hours. Lipid Profile: No results for input(s): CHOL, HDL, LDLCALC, TRIG, CHOLHDL, LDLDIRECT  in the last 72 hours. Thyroid Function Tests:  Recent Labs  12/02/15 1055  TSH 1.561   Anemia Panel: No results for input(s): VITAMINB12, FOLATE, FERRITIN, TIBC, IRON, RETICCTPCT in the last 72 hours. Urine analysis:    Component Value Date/Time   COLORURINE YELLOW 10/31/2013 2104   APPEARANCEUR CLEAR 10/31/2013 2104   LABSPEC 1.015 10/31/2013 2104   PHURINE 6.5 10/31/2013 2104   GLUCOSEU NEGATIVE 10/31/2013 2104   HGBUR NEGATIVE 10/31/2013 2104   BILIRUBINUR NEGATIVE 10/31/2013 2104   [place] NEGATIVE 10/31/2013 2104   PROTEINUR NEGATIVE 10/31/2013 2104   UROBILINOGEN 1.0 10/31/2013 2104   NITRITE NEGATIVE 10/31/2013 2104   LEUKOCYTESUR NEGATIVE 10/31/2013 2104   Sepsis Labs: !!!!!!!!!!!!!!!!!!!!!!!!!!!!!!!!!!!!!!!!!!!! @LABRCNTIP (procalcitonin:4,lacticidven:4) )No results  found for this or any previous visit (from the past 240 hour(s)).   Radiological Exams on Admission: No results found.  EKG: Independently reviewed.   Assessment/Plan Principal Problem:   Atrial fibrillation with rapid ventricular response (HCC) Active Problems:   Essential hypertension   Elevated troponin I level   Hyperlipidemia   COPD (chronic obstructive pulmonary disease) ([place])    1. Atrial fibrillation with RVR. Newly diagnosed. Patient reports atrial fibrillation symptomatology for a couple weeks now. His chads 2 score is 1. He takes an 81 mg aspirin daily. 2. Marginally elevated troponin I. Etiology may be secondary to RVR. 3. Hypertension. Currently controlled. He is treated chronically with amlodipine and losartan/HCTZ. 4. Hyperlipidemia. Patient is treated chronically with simvastatin and fish oil capsules. 5. COPD with tobacco abuse. His COPD is currently stable. He was advised to stop smoking.    Plan: 1. The patient was started on a diltiazem drip in the ED. His heart rate has improved and his blood pressure has become on the lower end of normal, so will discontinue the  diltiazem drip and start him on oral diltiazem 30 mg every 8 hours. 2. Will discontinue amlodipine. Will hold losartan HCTZ due to soft blood pressures. 3. Will start gentle IV fluids. 4. Will continue aspirin at 81 mg daily. Will continue statin and fish oil. 5. Patient was advised to stop smoking. We'll order nicotine patch if needed. 6. For further evaluation, will cycle troponin I, order 2-D echocardiogram, TSH, and fasting lipid profile. We'll order chest x-ray. Will order follow-up EKG for 12/03/15.    DVT prophylaxis:Lovenox Code Status: Full code Family Communication: Discussed with wife Disposition Plan: D/c home when appropriate, likely in the next 24 hours Consults called: None Admission status: observation   [place] MD [facility] Pager [phone]  If 7PM-7AM, please contact night-coverage [email or web address] Password [email or web address]  12/02/2015, 3:14 PM

## 2015-12-02 NOTE — Progress Notes (Signed)
Pt arrived for cataract extraction of right eye with intraocular lens implant right eye.  New on set of A Fib. Dr Patsey Berthold and Dr Geoffry Paradise notified repeat EKG showed new onset of A Fib with rapid response.  Dr Patsey Berthold requested patient to be seen in the ED Transferred to ED Room 11 reported to Alinda Money RN

## 2015-12-02 NOTE — ED Notes (Signed)
EDP aware of critical troponin of 0.03 called. EDP states it is within normal range.

## 2015-12-02 NOTE — Progress Notes (Signed)
HR A-fib, spiking up to 150, patient denies sob or pain. Georgina Peer, RN 12/02/2015 4:46 PM

## 2015-12-03 ENCOUNTER — Observation Stay (HOSPITAL_BASED_OUTPATIENT_CLINIC_OR_DEPARTMENT_OTHER): Payer: PPO

## 2015-12-03 DIAGNOSIS — I10 Essential (primary) hypertension: Secondary | ICD-10-CM | POA: Diagnosis not present

## 2015-12-03 DIAGNOSIS — J449 Chronic obstructive pulmonary disease, unspecified: Secondary | ICD-10-CM | POA: Diagnosis not present

## 2015-12-03 DIAGNOSIS — R748 Abnormal levels of other serum enzymes: Secondary | ICD-10-CM | POA: Diagnosis not present

## 2015-12-03 DIAGNOSIS — E782 Mixed hyperlipidemia: Secondary | ICD-10-CM | POA: Diagnosis not present

## 2015-12-03 DIAGNOSIS — I4891 Unspecified atrial fibrillation: Secondary | ICD-10-CM

## 2015-12-03 DIAGNOSIS — E785 Hyperlipidemia, unspecified: Secondary | ICD-10-CM | POA: Diagnosis not present

## 2015-12-03 LAB — BASIC METABOLIC PANEL
Anion gap: 7 (ref 5–15)
BUN: 16 mg/dL (ref 6–20)
CO2: 26 mmol/L (ref 22–32)
Calcium: 9.1 mg/dL (ref 8.9–10.3)
Chloride: 103 mmol/L (ref 101–111)
Creatinine, Ser: 0.86 mg/dL (ref 0.61–1.24)
GFR calc Af Amer: >60 mL/min (ref >60)
GFR calc non Af Amer: >60 mL/min (ref >60)
Glucose, Bld: 109 mg/dL — ABNORMAL HIGH (ref 65–99)
Potassium: 3.9 mmol/L (ref 3.5–5.1)
Sodium: 136 mmol/L (ref 135–145)

## 2015-12-03 LAB — TROPONIN I: Troponin I: <0.03 ng/mL (ref <0.03)

## 2015-12-03 LAB — CBC
HCT: 45.1 % (ref 39.0–52.0)
Hemoglobin: 15.6 g/dL (ref 13.0–17.0)
MCH: 33.7 pg (ref 26.0–34.0)
MCHC: 34.6 g/dL (ref 30.0–36.0)
MCV: 97.4 fL (ref 78.0–100.0)
Platelets: 235 10*3/uL (ref 150–400)
RBC: 4.63 MIL/uL (ref 4.22–5.81)
RDW: 12.3 % (ref 11.5–15.5)
WBC: 13.5 10*3/uL — ABNORMAL HIGH (ref 4.0–10.5)

## 2015-12-03 LAB — HEMOGLOBIN A1C
Hgb A1c MFr Bld: 5.2 % (ref 4.8–5.6)
Mean Plasma Glucose: 103 mg/dL

## 2015-12-03 LAB — LIPID PANEL
Cholesterol: 147 mg/dL (ref 0–200)
HDL: 27 mg/dL — ABNORMAL LOW (ref >40)
LDL Cholesterol: 56 mg/dL (ref 0–99)
Total CHOL/HDL Ratio: 5.4 RATIO
Triglycerides: 321 mg/dL — ABNORMAL HIGH (ref <150)
VLDL: 64 mg/dL — ABNORMAL HIGH (ref 0–40)

## 2015-12-03 LAB — ECHOCARDIOGRAM COMPLETE
Height: 72 in
Weight: 3178.15 oz

## 2015-12-03 MED ORDER — DILTIAZEM HCL 60 MG PO TABS
60.0000 mg | ORAL_TABLET | Freq: Three times a day (TID) | ORAL | Status: DC
  Administered 2015-12-03 – 2015-12-04 (×5): 60 mg via ORAL
  Filled 2015-12-03 (×5): qty 1

## 2015-12-03 MED ORDER — ATORVASTATIN CALCIUM 10 MG PO TABS
10.0000 mg | ORAL_TABLET | Freq: Every day | ORAL | Status: DC
  Administered 2015-12-03: 10 mg via ORAL
  Filled 2015-12-03: qty 1

## 2015-12-03 NOTE — Progress Notes (Addendum)
PROGRESS NOTE    Christopher Lang  [id number] DOB: [birth year] DOA: 12/02/2015 PCP: Celedonio Savage, MD    Brief Narrative:  Christopher Lang is a 60 y.o. male with medical history significant for hypertension, COPD, tobacco abuse and hyperlipidemia, who presented from anticipated cataract surgery  Preop EKG  revealed atrial fibrillation with a heart rate of 113 bpm.. Apparently, the preop EKG on 11/28/15 revealed normal sinus rhythm.  Patient reported intermittent palpitations for 2 weeks, but denied shortness of breath and chest pain. He was admitted for further evaluation and management of newly diagnosed atrial fibrillation with RVR.   Assessment & Plan:   Principal Problem:   Atrial fibrillation with rapid ventricular response (HCC) Active Problems:   Essential hypertension   Elevated troponin I level   Hyperlipidemia   COPD (chronic obstructive pulmonary disease) (HCC)   Tobacco abuse    1. Newly diagnosed atrial fibrillation with RVR. Patient's has had atrial fibrillation symptomatology for at least a couple of weeks. He takes aspirin chronically. His chest score to this one. He was continued on aspirin. Diltiazem drip was started and then stopped after his heart rate control. However, his heart rate went back up to the 150s. Diltiazem drip was reinstated. Over the past 12 hours, his heart rate has been consistently below 100. His TSH and free T4 are within normal limits. -2-D echocardiogram ordered and is pending. -We'll start Cardizem 60 mg every 8 hours with caution due to his soft blood pressures. Would favor monitoring him for the next 24 hours and challenge him with ambulation prior to anticipated discharge on 12/04/15.  -We'll make an outpatient appointment for him to follow-up with cardiology.  Marginally elevated troponin I. His troponin I I was 0.03 on admission but normalized to less than 0.033.  Essential hypertension. Patient is treated chronically with amlodipine  and losartan/HCTZ. Amlodipine was discontinued due to the start of Cardizem. Losartan/HCTZ is being held due to soft blood pressures-he may need a lower dose at the time of discharge.  Hyperlipidemia. Patient's fasting lipid profile results noted for total cholesterol of 147, triglycerides of 321, HDL of 27, and LDL of 56. He was continued on statin therapy and fish.  COPD with Tobacco abuse. Patient was advised to stop smoking. He has no signs of COPD exacerbation.  Mild leukocytosis. Patient has a mildly elevated WBC, but he has no signs or symptoms of infection. He is afebrile. His chest x-ray revealed no acute disease. He denies dysuria. The elevation may be secondary to the recent steroid eyedrops prior to the anticipated cataract surgery.    DVT prophylaxis: Lovenox Code Status: Full code Family Communication: Discussed with wife Disposition Plan: Discharge when clinically appropriate, likely in the next 24 hours.   Consultants:   None  Procedures:   2-D echocardiogram pending  Antimicrobials:   None   Subjective: Patient denies chest pain or shortness of breath. He denies palpitations. He denies productive cough, diarrhea, or pain with urination.  Objective: Vitals:   12/03/15 0000 12/03/15 0400 12/03/15 0500 12/03/15 0807  BP:      Pulse:      Resp:      Temp: 97.6 F (36.4 C) 97 F (36.1 C)  97.4 F (36.3 C)  TempSrc: Oral Oral  Oral  SpO2:      Weight:   90.1 kg (198 lb 10.2 oz)   Height:        Intake/Output Summary (Last 24 hours) at 12/03/15 0949 Last data  filed at 12/03/15 0933  Gross per 24 hour  Intake           565.67 ml  Output              950 ml  Net          -384.33 ml   Filed Weights   12/02/15 1341 12/03/15 0500  Weight: 89.2 kg (196 lb 10.4 oz) 90.1 kg (198 lb 10.2 oz)    Examination:  General exam: Appears calm and comfortable  Respiratory system: Clear to auscultation. Respiratory effort normal. Cardiovascular system:  Irregular, irregular without tachycardia. No pedal edema. Gastrointestinal system: Abdomen is nondistended, soft and nontender. No organomegaly or masses felt. Normal bowel sounds heard. Central nervous system: Alert and oriented. No focal neurological deficits. Extremities: Symmetric 5 x 5 power. Skin: No rashes, lesions or ulcers Psychiatry: Judgement and insight appear normal. Mood & affect appropriate.     Data Reviewed: I have personally reviewed following labs and imaging studies  CBC:  Recent Labs Lab 11/28/15 1337 12/02/15 1055 12/02/15 1509 12/03/15 0255  WBC 11.3* 13.5* 11.5* 13.5*  NEUTROABS  --  8.4*  --   --   HGB 15.6 16.9 16.1 15.6  HCT 43.4 48.1 46.1 45.1  MCV 95.2 96.6 97.1 97.4  PLT 224 266 258 [id number]   Basic Metabolic Panel:  Recent Labs Lab 11/28/15 1337 12/02/15 1055 12/02/15 1509 12/03/15 0255  NA 132* 137  --  136  K 3.8 4.0  --  3.9  CL 101 102  --  103  CO2 24 27  --  26  GLUCOSE 105* 116*  --  109*  BUN 9 11  --  16  CREATININE 0.80 0.98 1.01 0.86  CALCIUM 9.2 9.6  --  9.1  MG  --  1.8  --   --    GFR: Estimated Creatinine Clearance: 97.8 mL/min (by C-G formula based on SCr of 0.86 mg/dL). Liver Function Tests:  Recent Labs Lab 12/02/15 1055  AST 29  ALT 27  ALKPHOS 94  BILITOT 1.1  PROT 7.5  ALBUMIN 4.3   No results for input(s): LIPASE, AMYLASE in the last 168 hours. No results for input(s): AMMONIA in the last 168 hours. Coagulation Profile: No results for input(s): INR, PROTIME in the last 168 hours. Cardiac Enzymes:  Recent Labs Lab 12/02/15 1055 12/02/15 1509 12/02/15 2058 12/03/15 0255  TROPONINI 0.03* <0.03 <0.03 <0.03   BNP (last 3 results) No results for input(s): PROBNP in the last 8760 hours. HbA1C:  Recent Labs  12/02/15 1055  HGBA1C 5.2   CBG: No results for input(s): GLUCAP in the last 168 hours. Lipid Profile:  Recent Labs  12/03/15 0255  CHOL 147  HDL 27*  LDLCALC 56  TRIG 321*  CHOLHDL  5.4   Thyroid Function Tests:  Recent Labs  12/02/15 1055 12/02/15 1058  TSH 1.561  --   FREET4  --  0.96   Anemia Panel: No results for input(s): VITAMINB12, FOLATE, FERRITIN, TIBC, IRON, RETICCTPCT in the last 72 hours. Sepsis Labs: No results for input(s): PROCALCITON, LATICACIDVEN in the last 168 hours.  Recent Results (from the past 240 hour(s))  MRSA PCR Screening     Status: None   Collection Time: 12/02/15  2:35 PM  Result Value Ref Range Status   MRSA by PCR NEGATIVE NEGATIVE Final    Comment:        The GeneXpert MRSA Assay (FDA approved for NASAL specimens only), is  one component of a comprehensive MRSA colonization surveillance program. It is not intended to diagnose MRSA infection nor to guide or monitor treatment for MRSA infections.          Radiology Studies: Dg Chest 2 View  Result Date: 12/02/2015 CLINICAL DATA:  Atrial fibrillation EXAM: CHEST  2 VIEW COMPARISON:  None. FINDINGS: There is scarring in both upper lobes. There is no edema or consolidation. The heart size and pulmonary vascularity are normal. No adenopathy. There is atherosclerotic calcification in the aorta. There are no evident bone lesions. IMPRESSION: Scarring in both upper lobe regions.  No edema or consolidation. Electronically Signed   By: Lowella Grip III M.D.   On: 12/02/2015 16:27        Scheduled Meds: . aspirin EC  81 mg Oral Daily  . atorvastatin  10 mg Oral q1800  . cholecalciferol  1,000 Units Oral Daily  . diltiazem  60 mg Oral Q8H  . enoxaparin (LOVENOX) injection  40 mg Subcutaneous Q24H  . Influenza vac split quadrivalent PF  0.5 mL Intramuscular Tomorrow-1000  . omega-3 acid ethyl esters  1,000 mg Oral Daily  . sodium chloride flush  3 mL Intravenous Q12H   Continuous Infusions: . 0.9 % NaCl with KCl 20 mEq / L 65 mL/hr at 12/03/15 0704     LOS: 0 days    Time spent: 30 minutes.    Rexene Alberts, MD [facility] Pager  [phone]  If 7PM-7AM, please contact night-coverage [email or web address] Password [facility] 12/03/2015, 9:49 AM

## 2015-12-03 NOTE — Progress Notes (Signed)
*  PRELIMINARY RESULTS* Echocardiogram 2D Echocardiogram has been performed.  Christopher Lang 12/03/2015, 11:25 AM

## 2015-12-03 NOTE — Care Management Obs Status (Signed)
[place] NOTIFICATION   Patient Details  Name: Christopher Lang MRN: [id number] Date of Birth: 07/14/1955   Medicare Observation Status Notification Given:  Yes    Sherald Barge, RN 12/03/2015, 2:14 PM

## 2015-12-04 DIAGNOSIS — I10 Essential (primary) hypertension: Secondary | ICD-10-CM

## 2015-12-04 DIAGNOSIS — J449 Chronic obstructive pulmonary disease, unspecified: Secondary | ICD-10-CM | POA: Diagnosis not present

## 2015-12-04 DIAGNOSIS — R748 Abnormal levels of other serum enzymes: Secondary | ICD-10-CM

## 2015-12-04 DIAGNOSIS — I4891 Unspecified atrial fibrillation: Secondary | ICD-10-CM

## 2015-12-04 DIAGNOSIS — E782 Mixed hyperlipidemia: Secondary | ICD-10-CM

## 2015-12-04 LAB — CBC
HCT: 43.8 % (ref 39.0–52.0)
Hemoglobin: 14.8 g/dL (ref 13.0–17.0)
MCH: 33.1 pg (ref 26.0–34.0)
MCHC: 33.8 g/dL (ref 30.0–36.0)
MCV: 98 fL (ref 78.0–100.0)
Platelets: 232 10*3/uL (ref 150–400)
RBC: 4.47 MIL/uL (ref 4.22–5.81)
RDW: 12.1 % (ref 11.5–15.5)
WBC: 9.6 10*3/uL (ref 4.0–10.5)

## 2015-12-04 MED ORDER — DILTIAZEM HCL ER COATED BEADS 180 MG PO CP24: 180.0000 mg | ORAL_CAPSULE | Freq: Every day | ORAL | 0 refills | Status: DC

## 2015-12-04 MED ORDER — ATORVASTATIN CALCIUM 10 MG PO TABS: 10.0000 mg | ORAL_TABLET | Freq: Every day | ORAL | 0 refills | Status: DC

## 2015-12-04 MED ORDER — DILTIAZEM HCL ER COATED BEADS 180 MG PO CP24
180.0000 mg | ORAL_CAPSULE | Freq: Every day | ORAL | Status: DC
  Administered 2015-12-04: 180 mg via ORAL
  Filled 2015-12-04: qty 1

## 2015-12-04 NOTE — Progress Notes (Signed)
Pt IV and telemetry removed, tolerated well.  Reviewed discharge instructions with pt and family at bedside.  Answered questions at this time.  

## 2015-12-04 NOTE — Discharge Summary (Signed)
Physician Discharge Summary  Christopher Lang [id number] DOB: 10-22-1955 DOA: 12/02/2015  PCP: Christopher Savage, MD  Admit date: 12/02/2015 Discharge date: 12/04/2015  Admitted From: home Disposition:  home  Recommendations for Outpatient Follow-up:  1. Follow up with PCP in 1-2 weeks 2. Please obtain BMP/CBC in one week 3. Follow up with cardiology on 12/1 for hospital follow up  Home Health: Equipment/Devices:  Discharge Condition:stable CODE STATUS: full Diet recommendation: Heart Healthy   Brief/Interim Summary: Christopher Lang a 60 y.o.malewith medical history significant forhypertension, COPD, tobacco abuse and hyperlipidemia, who presented from anticipated cataract surgery  Preop EKG  revealed atrial fibrillation with a heart rate of 113 bpm.. Apparently, the preop EKG on 11/28/15 revealed normal sinus rhythm.  Patient reported intermittent palpitations for 2 weeks, but denied shortness of breath and chest pain. He was admitted for further evaluation and management of newly diagnosed atrial fibrillation with RVR  Discharge Diagnoses:  Principal Problem:   Atrial fibrillation with rapid ventricular response (HCC) Active Problems:   Essential hypertension   Elevated troponin I level   Hyperlipidemia   COPD (chronic obstructive pulmonary disease) (HCC)   Tobacco abuse  1. Newly diagnosed atrial fibrillation with RVR. Patient's has had atrial fibrillation symptomatology for at least a couple of weeks. He takes aspirin chronically. His CHADSVASc score is 1 for HTN. He was continued on aspirin. Diltiazem drip was started and then stopped after his heart rate was controled. Over the past 24 hours, his heart rate has been consistently below 100. His TSH and free T4 are within normal limits. -2-D echocardiogram done and is unremarkable. - He is able to ambulate while maintaining stable heart rate. Will transition to cardizem CD on discharge.  -He has an appointment to see  cardiology as an outpatient.  Marginally elevated troponin I. His troponin I I was 0.03 on admission but normalized to less than 0.033.  Essential hypertension. Patient is treated chronically with amlodipine and losartan/HCTZ. Amlodipine was discontinued due to the start of Cardizem. Losartan/HCTZ will be resumed on discharge. Blood pressures stable  Hyperlipidemia. Patient's fasting lipid profile results noted for total cholesterol of 147, triglycerides of 321, HDL of 27, and LDL of 56. He was continued on statin therapy.  COPD with Tobacco abuse. Patient was advised to stop smoking. He has no signs of COPD exacerbation.  Mild leukocytosis. Patient has a mildly elevated WBC, but he has no signs or symptoms of infection. He is afebrile. His chest x-ray revealed no acute disease. He denies dysuria. Now resolved.  Discharge Instructions  Discharge Instructions    Diet - low sodium heart healthy    Complete by:  As directed    Increase activity slowly    Complete by:  As directed        Medication List    STOP taking these medications   amLODipine 5 MG tablet Commonly known as:  NORVASC   simvastatin 20 MG tablet Commonly known as:  ZOCOR     TAKE these medications   aspirin EC 81 MG tablet Take 81 mg by mouth daily.   atorvastatin 10 MG tablet Commonly known as:  LIPITOR Take 1 tablet (10 mg total) by mouth daily at 6 PM.   cholecalciferol 1000 units tablet Commonly known as:  VITAMIN D Take 1,000 Units by mouth daily.   diltiazem 180 MG 24 hr capsule Commonly known as:  CARDIZEM CD Take 1 capsule (180 mg total) by mouth daily.   fish oil-omega-3 fatty acids  1000 MG capsule Take 1,000 mg by mouth daily.   losartan-hydrochlorothiazide 100-25 MG tablet Commonly known as:  HYZAAR Take 1 tablet by mouth daily.      Follow-up Information    Christopher Lesches, MD Follow up on 12/20/2015.   Specialty:  Cardiology Why:  CARDIOLOGY APPT. AT 10:20 AM Contact  information: [place] [address] [phone]          No Known Allergies  Consultations:     Procedures/Studies: Dg Chest 2 View  Result Date: 12/02/2015 CLINICAL DATA:  Atrial fibrillation EXAM: CHEST  2 VIEW COMPARISON:  None. FINDINGS: There is scarring in both upper lobes. There is no edema or consolidation. The heart size and pulmonary vascularity are normal. No adenopathy. There is atherosclerotic calcification in the aorta. There are no evident bone lesions. IMPRESSION: Scarring in both upper lobe regions.  No edema or consolidation. Electronically Signed   By: Christopher Lang M.D.   On: 12/02/2015 16:27    Echo: - Left ventricle: The cavity size was normal. Wall thickness was   normal. Systolic function was normal. The estimated ejection   fraction was in the range of 60% to 65%. Left ventricular   diastolic function parameters were normal. - Aortic valve: Valve area (VTI): 1.74 cm^2. Valve area (Vmax):   1.81 cm^2. Valve area (Vmean): 1.58 cm^2. - Mitral valve: Calcified annulus. Mildly thickened leaflets . - Left atrium: The atrium was mildly dilated. - Atrial septum: No defect or patent foramen ovale was identified   Subjective: No shortness of breath or chest pain  Discharge Exam: Vitals:   12/03/15 2141 12/04/15 0631  BP: (!) 151/68 130/68  Pulse: 72 69  Resp: 18 18  Temp: 97 F (36.1 C) 97.5 F (36.4 C)   Vitals:   12/03/15 1813 12/03/15 1936 12/03/15 2141 12/04/15 0631  BP:   (!) 151/68 130/68  Pulse:   72 69  Resp:   18 18  Temp: 97.5 F (36.4 C)  97 F (36.1 C) 97.5 F (36.4 C)  TempSrc: Oral  Oral Oral  SpO2:  94% 96% 96%  Weight:    92.4 kg (203 lb 11.2 oz)  Height:        General: Pt is alert, awake, not in acute distress Cardiovascular: RRR, S1/S2 +, no rubs, no gallops Respiratory: CTA bilaterally, no wheezing, no rhonchi Abdominal: Soft, NT, ND, bowel sounds + Extremities: no edema, no  cyanosis    The results of significant diagnostics from this hospitalization (including imaging, microbiology, ancillary and laboratory) are listed below for reference.     Microbiology: Recent Results (from the past 240 hour(s))  MRSA PCR Screening     Status: None   Collection Time: 12/02/15  2:35 PM  Result Value Ref Range Status   MRSA by PCR NEGATIVE NEGATIVE Final    Comment:        The GeneXpert MRSA Assay (FDA approved for NASAL specimens only), is one component of a comprehensive MRSA colonization surveillance program. It is not intended to diagnose MRSA infection nor to guide or monitor treatment for MRSA infections.      Labs: BNP (last 3 results) No results for input(s): BNP in the last 8760 hours. Basic Metabolic Panel:  Recent Labs Lab 11/28/15 1337 12/02/15 1055 12/02/15 1509 12/03/15 0255  NA 132* 137  --  136  K 3.8 4.0  --  3.9  CL 101 102  --  103  CO2 24 27  --  26  GLUCOSE 105* 116*  --  109*  BUN 9 11  --  16  CREATININE 0.80 0.98 1.01 0.86  CALCIUM 9.2 9.6  --  9.1  MG  --  1.8  --   --    Liver Function Tests:  Recent Labs Lab 12/02/15 1055  AST 29  ALT 27  ALKPHOS 94  BILITOT 1.1  PROT 7.5  ALBUMIN 4.3   No results for input(s): LIPASE, AMYLASE in the last 168 hours. No results for input(s): AMMONIA in the last 168 hours. CBC:  Recent Labs Lab 11/28/15 1337 12/02/15 1055 12/02/15 1509 12/03/15 0255 12/04/15 0633  WBC 11.3* 13.5* 11.5* 13.5* 9.6  NEUTROABS  --  8.4*  --   --   --   HGB 15.6 16.9 16.1 15.6 14.8  HCT 43.4 48.1 46.1 45.1 43.8  MCV 95.2 96.6 97.1 97.4 98.0  PLT 224 266 258 235 232   Cardiac Enzymes:  Recent Labs Lab 12/02/15 1055 12/02/15 1509 12/02/15 2058 12/03/15 0255  TROPONINI 0.03* <0.03 <0.03 <0.03   BNP: Invalid input(s): POCBNP CBG: No results for input(s): GLUCAP in the last 168 hours. D-Dimer No results for input(s): DDIMER in the last 72 hours. Hgb A1c  Recent Labs   12/02/15 1055  HGBA1C 5.2   Lipid Profile  Recent Labs  12/03/15 0255  CHOL 147  HDL 27*  LDLCALC 56  TRIG 321*  CHOLHDL 5.4   Thyroid function studies  Recent Labs  12/02/15 1055  TSH 1.561   Anemia work up No results for input(s): VITAMINB12, FOLATE, FERRITIN, TIBC, IRON, RETICCTPCT in the last 72 hours. Urinalysis    Component Value Date/Time   COLORURINE YELLOW 10/31/2013 2104   APPEARANCEUR CLEAR 10/31/2013 2104   LABSPEC 1.015 10/31/2013 2104   PHURINE 6.5 10/31/2013 2104   GLUCOSEU NEGATIVE 10/31/2013 2104   HGBUR NEGATIVE 10/31/2013 2104   BILIRUBINUR NEGATIVE 10/31/2013 2104   [place] NEGATIVE 10/31/2013 2104   PROTEINUR NEGATIVE 10/31/2013 2104   UROBILINOGEN 1.0 10/31/2013 2104   NITRITE NEGATIVE 10/31/2013 2104   LEUKOCYTESUR NEGATIVE 10/31/2013 2104   Sepsis Labs Invalid input(s): PROCALCITONIN,  WBC,  LACTICIDVEN Microbiology Recent Results (from the past 240 hour(s))  MRSA PCR Screening     Status: None   Collection Time: 12/02/15  2:35 PM  Result Value Ref Range Status   MRSA by PCR NEGATIVE NEGATIVE Final    Comment:        The GeneXpert MRSA Assay (FDA approved for NASAL specimens only), is one component of a comprehensive MRSA colonization surveillance program. It is not intended to diagnose MRSA infection nor to guide or monitor treatment for MRSA infections.      Time coordinating discharge: Over 30 minutes  SIGNED:   Kathie Dike, MD  [facility] 12/04/2015, 1:29 PM Pager   If 7PM-7AM, please contact night-coverage [email or web address] Password [email or web address]

## 2015-12-04 NOTE — Care Management Note (Signed)
Case Management Note  Patient Details  Name: Christopher Lang MRN: [id number] Date of Birth: Dec 19, 1955  Subjective/Objective:                  Pt is from home, admitted with a-fib with RVR. Pt has converted back to NSR. Pt is from home, lives with iwfe and is ind with ADL's. He has PCP, transportation and no difficulty affording his medications. He plans to return home with self care at DC.   Action/Plan: NO CM needs.   Expected Discharge Date:  12/07/15               Expected Discharge Plan:  Home/Self Care  In-House Referral:  NA  Discharge planning Services  CM Consult  Post Acute Care Choice:  NA Choice offered to:  NA  Status of Service:  Completed, signed off  Sherald Barge, RN 12/04/2015, 2:34 PM

## 2015-12-19 ENCOUNTER — Encounter: Payer: Self-pay | Admitting: Cardiology

## 2015-12-19 NOTE — Progress Notes (Signed)
Cardiology Office Note  Date: 12/20/2015   ID: Christopher Lang, DOB May 18, 1955, MRN [id number]  PCP: Celedonio Savage, MD  Referring provider: Rexene Alberts, MD  Primary Cardiologist: Rozann Lesches, MD   Chief Complaint  Patient presents with  . Paroxysmal atrial fibrillation    History of Present Illness: Christopher Lang is a 60 y.o. male referred for cardiology consultation by Dr. Caryn Section. Patient was recently hospitalized with newly documented atrial fibrillation, CHADSVASC score of 1. This was noted on pre-cataract surgery ECG (12/02/2015 tracing only). He was treated with aspirin and Cardizem CD, echocardiogram outlined below. It looks like he spontaneously converted to sinus rhythm by follow-up tracing.  He is here today with his wife, states that he feels well. He does not endorse any palpitations or chest pain. He reports compliance with his medications and followed up with Dr. Wenda Overland yesterday.  Stay we discussed management options for atrial fibrillation. He has a low thromboembolic risk score at this point. I reviewed his echocardiogram as well, results outlined below.  Past Medical History:  Diagnosis Date  . COPD (chronic obstructive pulmonary disease) ([place])   . Essential hypertension   . Hyperlipidemia   . Sleep apnea     Past Surgical History:  Procedure Laterality Date  . NO PAST SURGERIES      Current Outpatient Prescriptions  Medication Sig Dispense Refill  . aspirin EC 81 MG tablet Take 81 mg by mouth daily.    Marland Kitchen atorvastatin (LIPITOR) 10 MG tablet Take 1 tablet (10 mg total) by mouth daily at 6 PM. 30 tablet 0  . cholecalciferol (VITAMIN D) 1000 units tablet Take 1,000 Units by mouth daily.    Marland Kitchen diltiazem (CARDIZEM CD) 180 MG 24 hr capsule Take 1 capsule (180 mg total) by mouth daily. 30 capsule 0  . fish oil-omega-3 fatty acids 1000 MG capsule Take 1,000 mg by mouth daily.      Marland Kitchen losartan-hydrochlorothiazide (HYZAAR) 100-25 MG tablet Take 1 tablet by mouth  daily.     No current facility-administered medications for this visit.    Allergies:  Patient has no known allergies.   Social History: The patient  reports that he has been smoking Cigarettes.  He has a 25.00 pack-year smoking history. He has never used smokeless tobacco. He reports that he drinks about 12.0 oz of alcohol per week . He reports that he does not use drugs.   Family History: The patient's family history includes CAD in his mother and sister; Cancer in his brother; Deep vein thrombosis in his sister; Diabetes in his sister; High blood pressure in his mother and sister; Kidney disease in his brother; Leukemia in his father; Pulmonary embolism in his sister.   ROS:  Please see the history of present illness. Otherwise, complete review of systems is positive for constipation.  All other systems are reviewed and negative.   Physical Exam: VS:  BP 124/62   Pulse 83   Ht 6' (1.829 m)   Wt 206 lb 12.8 oz (93.8 kg)   SpO2 93%   BMI 28.05 kg/m , BMI Body mass index is 28.05 kg/m.  Wt Readings from Last 3 Encounters:  12/20/15 206 lb 12.8 oz (93.8 kg)  12/04/15 203 lb 11.2 oz (92.4 kg)  10/31/13 211 lb (95.7 kg)    General: Patient appears comfortable at rest. HEENT: Conjunctiva and lids normal, oropharynx clear with moist mucosa. Neck: Supple, no elevated JVP or carotid bruits, no thyromegaly. Lungs: Clear to auscultation,  nonlabored breathing at rest. Cardiac: Regular rate and rhythm, no S3 or significant systolic murmur, no pericardial rub. Abdomen: Soft, nontender, bowel sounds present, no guarding or rebound. Extremities: No pitting edema, distal pulses 2+. Skin: Warm and dry. Musculoskeletal: No kyphosis. Neuropsychiatric: Alert and oriented x3, affect grossly appropriate.  ECG: I personally reviewed the tracing from11/14/2017 which showed sinus bradycardia with nonspecific ST changes.  Recent Labwork: 12/02/2015: ALT 27; AST 29; Magnesium 1.8; TSH  1.561 12/03/2015: BUN 16; Creatinine, Ser 0.86; Potassium 3.9; Sodium 136 12/04/2015: Hemoglobin 14.8; Platelets 232     Component Value Date/Time   CHOL 147 12/03/2015 0255   TRIG 321 (H) 12/03/2015 0255   HDL 27 (L) 12/03/2015 0255   CHOLHDL 5.4 12/03/2015 0255   VLDL 64 (H) 12/03/2015 0255   LDLCALC 56 12/03/2015 0255    Other Studies Reviewed Today:  Echocardiogram 12/03/2015: Study Conclusions  - Left ventricle: The cavity size was normal. Wall thickness was   normal. Systolic function was normal. The estimated ejection   fraction was in the range of 60% to 65%. Left ventricular   diastolic function parameters were normal. - Aortic valve: Valve area (VTI): 1.74 cm^2. Valve area (Vmax):   1.81 cm^2. Valve area (Vmean): 1.58 cm^2. - Mitral valve: Calcified annulus. Mildly thickened leaflets . - Left atrium: The atrium was mildly dilated. - Atrial septum: No defect or patent foramen ovale was identified.  Assessment and Plan:  1. Paroxysmal atrial fibrillation with low thromboembolic risk score as outlined. He is not particularly symptomatic with this, episode was noted by a preprocedure ECG without significant symptoms. Heart rate is regular today and he reports tolerating aspirin and Cardizem CD which will be continued at this point. LVEF normal range. Left atrium mildly dilated. We will continue with observation for now.  2. Cataracts, he should be able to go ahead and proceed with cataract surgery at relatively low cardiac risk.  3. Essential hypertension, also on Hyzaar. Blood pressure is well controlled today.  4. Hyperlipidemia, on Lipitor.  Current medicines were reviewed with the patient today.  Disposition: Follow-up in 6 months.  Signed, Satira Sark, MD, [facility] 12/20/2015 10:12 AM    [facility] [address] [place], [place] [address] Phone: [phone]; Fax: [phone]

## 2015-12-20 ENCOUNTER — Encounter: Payer: Self-pay | Admitting: Cardiology

## 2015-12-20 ENCOUNTER — Ambulatory Visit (INDEPENDENT_AMBULATORY_CARE_PROVIDER_SITE_OTHER): Payer: PPO | Admitting: Cardiology

## 2015-12-20 VITALS — BP 124/62 | HR 83 | Ht 72.0 in | Wt 206.8 lb

## 2015-12-20 DIAGNOSIS — E782 Mixed hyperlipidemia: Secondary | ICD-10-CM

## 2015-12-20 DIAGNOSIS — H269 Unspecified cataract: Secondary | ICD-10-CM | POA: Diagnosis not present

## 2015-12-20 DIAGNOSIS — I10 Essential (primary) hypertension: Secondary | ICD-10-CM

## 2015-12-20 DIAGNOSIS — I480 Paroxysmal atrial fibrillation: Secondary | ICD-10-CM

## 2015-12-20 NOTE — Patient Instructions (Signed)
Your physician wants you to follow-up in: 6 months Dr Ferne Reus will receive a reminder letter in the mail two months in advance. If you don't receive a letter, please call our office to schedule the follow-up appointment.    Your physician recommends that you continue on your current medications as directed. Please refer to the Current Medication list given to you today.    You are cleared to have cataract surgery with Dr.Hunt,we will forward this info to him      Thank you for choosing [facility] !

## 2015-12-31 ENCOUNTER — Telehealth: Payer: Self-pay | Admitting: Cardiology

## 2015-12-31 NOTE — Telephone Encounter (Signed)
Faxed last office note clearing pt for surgery to [facility] [phone]. (attn:Cheryl Sims)

## 2015-12-31 NOTE — Telephone Encounter (Signed)
Pt called stating his eye surgeon has not received his surgical clearance yet.

## 2016-01-10 ENCOUNTER — Encounter (HOSPITAL_COMMUNITY): Payer: Self-pay

## 2016-01-14 ENCOUNTER — Encounter (HOSPITAL_COMMUNITY)
Admission: RE | Admit: 2016-01-14 | Discharge: 2016-01-14 | Disposition: A | Discharge status: Home / Self Care | Payer: PPO | Source: Ambulatory Visit | Attending: Ophthalmology | Admitting: Ophthalmology

## 2016-01-16 ENCOUNTER — Encounter (HOSPITAL_COMMUNITY)
Admission: RE | Disposition: A | Discharge status: Home / Self Care | Payer: Self-pay | Source: Ambulatory Visit | Attending: Ophthalmology

## 2016-01-16 ENCOUNTER — Ambulatory Visit (HOSPITAL_COMMUNITY)
Admission: RE | Admit: 2016-01-16 | Discharge: 2016-01-16 | Disposition: A | Discharge status: Home / Self Care | Payer: PPO | Source: Ambulatory Visit | Attending: Ophthalmology | Admitting: Ophthalmology

## 2016-01-16 ENCOUNTER — Ambulatory Visit (HOSPITAL_COMMUNITY): Payer: PPO | Admitting: Anesthesiology

## 2016-01-16 ENCOUNTER — Encounter (HOSPITAL_COMMUNITY): Payer: Self-pay | Admitting: Anesthesiology

## 2016-01-16 DIAGNOSIS — F17210 Nicotine dependence, cigarettes, uncomplicated: Secondary | ICD-10-CM | POA: Insufficient documentation

## 2016-01-16 DIAGNOSIS — J449 Chronic obstructive pulmonary disease, unspecified: Secondary | ICD-10-CM | POA: Diagnosis not present

## 2016-01-16 DIAGNOSIS — H25811 Combined forms of age-related cataract, right eye: Secondary | ICD-10-CM | POA: Diagnosis not present

## 2016-01-16 DIAGNOSIS — I4891 Unspecified atrial fibrillation: Secondary | ICD-10-CM | POA: Insufficient documentation

## 2016-01-16 DIAGNOSIS — H269 Unspecified cataract: Secondary | ICD-10-CM | POA: Diagnosis present

## 2016-01-16 DIAGNOSIS — G4730 Sleep apnea, unspecified: Secondary | ICD-10-CM | POA: Insufficient documentation

## 2016-01-16 DIAGNOSIS — I10 Essential (primary) hypertension: Secondary | ICD-10-CM | POA: Insufficient documentation

## 2016-01-16 HISTORY — PX: CATARACT EXTRACTION W/PHACO: SHX586

## 2016-01-16 SURGERY — PHACOEMULSIFICATION, CATARACT, WITH IOL INSERTION
Anesthesia: Monitor Anesthesia Care | Site: Eye | Laterality: Right

## 2016-01-16 MED ORDER — FENTANYL CITRATE (PF) 100 MCG/2ML IJ SOLN
25.0000 ug | INTRAMUSCULAR | Status: DC | PRN
  Administered 2016-01-16: 25 ug via INTRAVENOUS

## 2016-01-16 MED ORDER — LIDOCAINE HCL 3.5 % OP GEL
1.0000 "application " | Freq: Once | OPHTHALMIC | Status: AC
  Administered 2016-01-16: 1 via OPHTHALMIC

## 2016-01-16 MED ORDER — TETRACAINE HCL 0.5 % OP SOLN
1.0000 [drp] | OPHTHALMIC | Status: AC
  Administered 2016-01-16 (×3): 1 [drp] via OPHTHALMIC

## 2016-01-16 MED ORDER — LACTATED RINGERS IV SOLN
INTRAVENOUS | Status: DC
  Administered 2016-01-16: 12:00:00 via INTRAVENOUS

## 2016-01-16 MED ORDER — EPINEPHRINE PF 1 MG/ML IJ SOLN
INTRAOCULAR | Status: DC | PRN
  Administered 2016-01-16: 500 mL

## 2016-01-16 MED ORDER — BSS IO SOLN
INTRAOCULAR | Status: DC | PRN
  Administered 2016-01-16: 15 mL via INTRAOCULAR

## 2016-01-16 MED ORDER — NEOMYCIN-POLYMYXIN-DEXAMETH 3.5-10000-0.1 OP SUSP
OPHTHALMIC | Status: DC | PRN
  Administered 2016-01-16: 1 [drp] via OPHTHALMIC

## 2016-01-16 MED ORDER — MIDAZOLAM HCL 2 MG/2ML IJ SOLN
1.0000 mg | INTRAMUSCULAR | Status: DC | PRN
  Administered 2016-01-16: 2 mg via INTRAVENOUS

## 2016-01-16 MED ORDER — ACETAZOLAMIDE 250 MG PO TABS
500.0000 mg | ORAL_TABLET | Freq: Once | ORAL | Status: AC
  Administered 2016-01-16: 500 mg via ORAL
  Filled 2016-01-16: qty 2

## 2016-01-16 MED ORDER — EPINEPHRINE PF 1 MG/ML IJ SOLN
INTRAMUSCULAR | Status: AC
  Filled 2016-01-16: qty 1

## 2016-01-16 MED ORDER — MIDAZOLAM HCL 2 MG/2ML IJ SOLN
INTRAMUSCULAR | Status: AC
  Filled 2016-01-16: qty 2

## 2016-01-16 MED ORDER — CYCLOPENTOLATE-PHENYLEPHRINE 0.2-1 % OP SOLN
1.0000 [drp] | OPHTHALMIC | Status: AC
  Administered 2016-01-16 (×3): 1 [drp] via OPHTHALMIC

## 2016-01-16 MED ORDER — POVIDONE-IODINE 5 % OP SOLN
OPHTHALMIC | Status: DC | PRN
  Administered 2016-01-16: 1 via OPHTHALMIC

## 2016-01-16 MED ORDER — FENTANYL CITRATE (PF) 100 MCG/2ML IJ SOLN
INTRAMUSCULAR | Status: AC
  Filled 2016-01-16: qty 2

## 2016-01-16 MED ORDER — PHENYLEPHRINE HCL 2.5 % OP SOLN
1.0000 [drp] | OPHTHALMIC | Status: AC
  Administered 2016-01-16 (×3): 1 [drp] via OPHTHALMIC

## 2016-01-16 MED ORDER — PROVISC 10 MG/ML IO SOLN
INTRAOCULAR | Status: DC | PRN
  Administered 2016-01-16: 0.85 mL via INTRAOCULAR

## 2016-01-16 MED ORDER — LIDOCAINE HCL (PF) 1 % IJ SOLN
INTRAOCULAR | Status: DC | PRN
  Administered 2016-01-16: .9 mL via OPHTHALMIC

## 2016-01-16 SURGICAL SUPPLY — 23 items
CAPSULAR TENSION RING-AMO (OPHTHALMIC RELATED) IMPLANT
CLOTH BEACON ORANGE TIMEOUT ST (SAFETY) ×2 IMPLANT
EYE SHIELD UNIVERSAL CLEAR (GAUZE/BANDAGES/DRESSINGS) ×2 IMPLANT
GLOVE BIOGEL PI IND STRL 6.5 (GLOVE) IMPLANT
GLOVE BIOGEL PI IND STRL 7.0 (GLOVE) ×3 IMPLANT
GLOVE BIOGEL PI INDICATOR 6.5 (GLOVE)
GLOVE BIOGEL PI INDICATOR 7.0 (GLOVE) ×3
GLOVE EXAM NITRILE LRG STRL (GLOVE) IMPLANT
GLOVE EXAM NITRILE MD LF STRL (GLOVE) IMPLANT
KIT VITRECTOMY (OPHTHALMIC RELATED) IMPLANT
PAD ARMBOARD 7.5X6 YLW CONV (MISCELLANEOUS) ×2 IMPLANT
PROC W NO LENS (INTRAOCULAR LENS)
PROC W SPEC LENS (INTRAOCULAR LENS)
PROCESS W NO LENS (INTRAOCULAR LENS) IMPLANT
PROCESS W SPEC LENS (INTRAOCULAR LENS) IMPLANT
RETRACTOR IRIS SIGHTPATH (OPHTHALMIC RELATED) IMPLANT
RING MALYGIN (MISCELLANEOUS) IMPLANT
SIGHTPATH CAT PROC W REG LENS (Ophthalmic Related) ×2 IMPLANT
SYRINGE LUER LOK 1CC (MISCELLANEOUS) ×2 IMPLANT
TAPE SURG TRANSPORE 1 IN (GAUZE/BANDAGES/DRESSINGS) ×1 IMPLANT
TAPE SURGICAL TRANSPORE 1 IN (GAUZE/BANDAGES/DRESSINGS) ×1
VISCOELASTIC ADDITIONAL (OPHTHALMIC RELATED) IMPLANT
WATER STERILE IRR 250ML POUR (IV SOLUTION) ×2 IMPLANT

## 2016-01-16 NOTE — Anesthesia Preprocedure Evaluation (Signed)
Anesthesia Evaluation  Patient identified by MRN, date of birth, ID band Patient awake    Reviewed: Allergy & Precautions, NPO status , Patient's Chart, lab work & pertinent test results  Airway Mallampati: II  TM Distance: >3 FB     Dental  (+) Teeth Intact   Pulmonary sleep apnea , COPD, Current Smoker,    breath sounds clear to auscultation       Cardiovascular hypertension, Pt. on medications + dysrhythmias (hx AF with RVR)  Rhythm:Regular Rate:Normal     Neuro/Psych    GI/Hepatic negative GI ROS,   Endo/Other    Renal/GU      Musculoskeletal   Abdominal   Peds  Hematology   Anesthesia Other Findings   Reproductive/Obstetrics                             Anesthesia Physical Anesthesia Plan  ASA: III  Anesthesia Plan: MAC   Post-op Pain Management:    Induction: Intravenous  Airway Management Planned: Nasal Cannula  Additional Equipment:   Intra-op Plan:   Post-operative Plan:   Informed Consent: I have reviewed the patients History and Physical, chart, labs and discussed the procedure including the risks, benefits and alternatives for the proposed anesthesia with the patient or authorized representative who has indicated his/her understanding and acceptance.     Plan Discussed with:   Anesthesia Plan Comments:         Anesthesia Quick Evaluation  

## 2016-01-16 NOTE — Anesthesia Postprocedure Evaluation (Signed)
Anesthesia Post Note  Patient: Christopher Lang  Procedure(s) Performed: Procedure(s) (LRB): CATARACT EXTRACTION PHACO AND INTRAOCULAR LENS PLACEMENT RIGHT EYE CDE= 6.97 (Right)  Patient location during evaluation: Short Stay Anesthesia Type: MAC Level of consciousness: awake and alert, oriented and patient cooperative Pain management: pain level controlled Vital Signs Assessment: post-procedure vital signs reviewed and stable Respiratory status: spontaneous breathing, nonlabored ventilation and respiratory function stable Cardiovascular status: blood pressure returned to baseline Postop Assessment: no signs of nausea or vomiting Anesthetic complications: no     Last Vitals:  Vitals:   01/16/16 1250 01/16/16 1255  BP: (!) 108/57 (!) 107/59  Pulse:    Resp: 16 16  Temp:      Last Pain:  Vitals:   01/16/16 1209  TempSrc: Oral                 Retha Bither J

## 2016-01-16 NOTE — H&P (Signed)
I have reviewed the H&P, the patient was re-examined, and I have identified no interval changes in medical condition and plan of care since the history and physical of record  

## 2016-01-16 NOTE — Discharge Instructions (Signed)

## 2016-01-16 NOTE — Op Note (Signed)
Date of Admission: 01/16/2016  Date of Surgery: 01/16/2016  Pre-Op Dx: Cataract Right  Eye  Post-Op Dx: Senile Combined Cataract  Right  Eye,  Dx Code [id number]  Surgeon: Tonny Branch, M.D.  Assistants: None  Anesthesia: Topical with MAC  Indications: Painless, progressive loss of vision with compromise of daily activities.  Surgery: Cataract Extraction with Intraocular lens Implant Right Eye  Discription: The patient had dilating drops and viscous lidocaine placed into the Right eye in the pre-op holding area. After transfer to the operating room, a time out was performed. The patient was then prepped and draped. Beginning with a 11 degree blade a paracentesis port was made at the surgeon's 2 o'clock position. The anterior chamber was then filled with 1% non-preserved lidocaine with epinepherine. This was followed by filling the anterior chamber with Provisc.  A 2.84mm keratome blade was used to make a clear corneal incision at the temporal limbus.  A bent cystatome needle was used to create a continuous tear capsulotomy. Hydrodissection was performed with balanced salt solution on a Fine canula. The lens nucleus was then removed using the phacoemulsification handpiece. Residual cortex was removed with the I&A handpiece. The anterior chamber and capsular bag were refilled with Provisc. A posterior chamber intraocular lens was placed into the capsular bag with it's injector. The implant was positioned with the Kuglan hook. The Provisc was then removed from the anterior chamber and capsular bag with the I&A handpiece. Stromal hydration of the main incision and paracentesis port was performed with BSS on a Fine canula. The wounds were tested for leak which was negative. The patient tolerated the procedure well. There were no operative complications. The patient was then transferred to the recovery room in stable condition.  Complications: None  Specimen: None  EBL: None  Prosthetic device: Abbott  Technis, PCB00, power 21.0, SN [id number].

## 2016-01-16 NOTE — Transfer of Care (Signed)
Immediate Anesthesia Transfer of Care Note  Patient: Christopher Lang  Procedure(s) Performed: Procedure(s) with comments: CATARACT EXTRACTION PHACO AND INTRAOCULAR LENS PLACEMENT RIGHT EYE CDE= 6.97 (Right) - right  Patient Location: Short Stay  Anesthesia Type:MAC  Level of Consciousness: awake, alert , oriented and patient cooperative  Airway & Oxygen Therapy: Patient Spontanous Breathing  Post-op Assessment: Report given to RN, Post -op Vital signs reviewed and stable and Patient moving all extremities  Post vital signs: Reviewed and stable  Last Vitals:  Vitals:   01/16/16 1250 01/16/16 1255  BP: (!) 108/57 (!) 107/59  Pulse:    Resp: 16 16  Temp:      Last Pain:  Vitals:   01/16/16 1209  TempSrc: Oral         Complications: No apparent anesthesia complications

## 2016-01-17 ENCOUNTER — Encounter (HOSPITAL_COMMUNITY): Payer: Self-pay | Admitting: Ophthalmology

## 2016-01-22 DIAGNOSIS — H40053 Ocular hypertension, bilateral: Secondary | ICD-10-CM | POA: Diagnosis not present

## 2016-01-22 DIAGNOSIS — H25812 Combined forms of age-related cataract, left eye: Secondary | ICD-10-CM | POA: Diagnosis not present

## 2016-01-22 DIAGNOSIS — Z961 Presence of intraocular lens: Secondary | ICD-10-CM | POA: Diagnosis not present

## 2016-01-23 DIAGNOSIS — H25812 Combined forms of age-related cataract, left eye: Secondary | ICD-10-CM | POA: Diagnosis not present

## 2016-01-28 ENCOUNTER — Encounter (HOSPITAL_COMMUNITY): Payer: Self-pay

## 2016-01-28 ENCOUNTER — Encounter (HOSPITAL_COMMUNITY)
Admission: RE | Admit: 2016-01-28 | Discharge: 2016-01-28 | Disposition: A | Discharge status: Home / Self Care | Payer: Medicare HMO | Source: Ambulatory Visit | Attending: Ophthalmology | Admitting: Ophthalmology

## 2016-01-30 ENCOUNTER — Ambulatory Visit (HOSPITAL_COMMUNITY)
Admission: RE | Admit: 2016-01-30 | Discharge: 2016-01-30 | Disposition: A | Discharge status: Home / Self Care | Payer: Medicare HMO | Source: Ambulatory Visit | Attending: Ophthalmology | Admitting: Ophthalmology

## 2016-01-30 ENCOUNTER — Encounter (HOSPITAL_COMMUNITY)
Admission: RE | Disposition: A | Discharge status: Home / Self Care | Payer: Self-pay | Source: Ambulatory Visit | Attending: Ophthalmology

## 2016-01-30 ENCOUNTER — Ambulatory Visit (HOSPITAL_COMMUNITY): Payer: Medicare HMO | Admitting: Anesthesiology

## 2016-01-30 ENCOUNTER — Encounter (HOSPITAL_COMMUNITY): Payer: Self-pay

## 2016-01-30 DIAGNOSIS — I10 Essential (primary) hypertension: Secondary | ICD-10-CM | POA: Insufficient documentation

## 2016-01-30 DIAGNOSIS — G4730 Sleep apnea, unspecified: Secondary | ICD-10-CM | POA: Insufficient documentation

## 2016-01-30 DIAGNOSIS — H25812 Combined forms of age-related cataract, left eye: Secondary | ICD-10-CM | POA: Diagnosis not present

## 2016-01-30 DIAGNOSIS — H2512 Age-related nuclear cataract, left eye: Secondary | ICD-10-CM | POA: Diagnosis not present

## 2016-01-30 DIAGNOSIS — J449 Chronic obstructive pulmonary disease, unspecified: Secondary | ICD-10-CM | POA: Insufficient documentation

## 2016-01-30 HISTORY — PX: CATARACT EXTRACTION W/PHACO: SHX586

## 2016-01-30 SURGERY — PHACOEMULSIFICATION, CATARACT, WITH IOL INSERTION
Anesthesia: Monitor Anesthesia Care | Site: Eye | Laterality: Left

## 2016-01-30 MED ORDER — PROVISC 10 MG/ML IO SOLN
INTRAOCULAR | Status: DC | PRN
  Administered 2016-01-30: .85 mL via INTRAOCULAR

## 2016-01-30 MED ORDER — ACETAZOLAMIDE 250 MG PO TABS
500.0000 mg | ORAL_TABLET | Freq: Once | ORAL | Status: AC
  Administered 2016-01-30: 500 mg via ORAL

## 2016-01-30 MED ORDER — EPINEPHRINE PF 1 MG/ML IJ SOLN
INTRAMUSCULAR | Status: DC | PRN
  Administered 2016-01-30: 500 mL

## 2016-01-30 MED ORDER — POVIDONE-IODINE 5 % OP SOLN
OPHTHALMIC | Status: DC | PRN
  Administered 2016-01-30: 1 via OPHTHALMIC

## 2016-01-30 MED ORDER — EPINEPHRINE PF 1 MG/ML IJ SOLN
INTRAMUSCULAR | Status: AC
  Filled 2016-01-30: qty 1

## 2016-01-30 MED ORDER — MIDAZOLAM HCL 2 MG/2ML IJ SOLN
INTRAMUSCULAR | Status: AC
  Filled 2016-01-30: qty 2

## 2016-01-30 MED ORDER — TETRACAINE HCL 0.5 % OP SOLN
1.0000 [drp] | OPHTHALMIC | Status: AC
  Administered 2016-01-30 (×3): 1 [drp] via OPHTHALMIC

## 2016-01-30 MED ORDER — LIDOCAINE HCL (PF) 1 % IJ SOLN
INTRAOCULAR | Status: DC | PRN
  Administered 2016-01-30: .8 mL via OPHTHALMIC

## 2016-01-30 MED ORDER — BSS IO SOLN
INTRAOCULAR | Status: DC | PRN
  Administered 2016-01-30: 15 mL via INTRAOCULAR

## 2016-01-30 MED ORDER — MIDAZOLAM HCL 2 MG/2ML IJ SOLN
0.5000 mg | INTRAMUSCULAR | Status: DC | PRN
  Administered 2016-01-30: 2 mg via INTRAVENOUS

## 2016-01-30 MED ORDER — PHENYLEPHRINE HCL 2.5 % OP SOLN
1.0000 [drp] | OPHTHALMIC | Status: AC
  Administered 2016-01-30 (×3): 1 [drp] via OPHTHALMIC

## 2016-01-30 MED ORDER — FENTANYL CITRATE (PF) 100 MCG/2ML IJ SOLN
INTRAMUSCULAR | Status: AC
  Filled 2016-01-30: qty 2

## 2016-01-30 MED ORDER — ACETAZOLAMIDE 250 MG PO TABS
ORAL_TABLET | ORAL | Status: AC
  Filled 2016-01-30: qty 1

## 2016-01-30 MED ORDER — ACETAZOLAMIDE ER 500 MG PO CP12: 500.0000 mg | ORAL_CAPSULE | ORAL | 0 refills | Status: DC

## 2016-01-30 MED ORDER — NEOMYCIN-POLYMYXIN-DEXAMETH 3.5-10000-0.1 OP SUSP
OPHTHALMIC | Status: DC | PRN
  Administered 2016-01-30: 2 [drp] via OPHTHALMIC

## 2016-01-30 MED ORDER — LACTATED RINGERS IV SOLN
INTRAVENOUS | Status: DC
  Administered 2016-01-30: 1000 mL via INTRAVENOUS

## 2016-01-30 MED ORDER — LIDOCAINE HCL 3.5 % OP GEL
1.0000 "application " | Freq: Once | OPHTHALMIC | Status: AC
  Administered 2016-01-30: 1 via OPHTHALMIC

## 2016-01-30 MED ORDER — FENTANYL CITRATE (PF) 100 MCG/2ML IJ SOLN
25.0000 ug | INTRAMUSCULAR | Status: AC | PRN
  Administered 2016-01-30 (×2): 25 ug via INTRAVENOUS

## 2016-01-30 MED ORDER — CYCLOPENTOLATE-PHENYLEPHRINE 0.2-1 % OP SOLN
1.0000 [drp] | OPHTHALMIC | Status: AC
  Administered 2016-01-30 (×3): 1 [drp] via OPHTHALMIC

## 2016-01-30 SURGICAL SUPPLY — 22 items
CAPSULAR TENSION RING-AMO (OPHTHALMIC RELATED) IMPLANT
CLOTH BEACON ORANGE TIMEOUT ST (SAFETY) ×2 IMPLANT
EYE SHIELD UNIVERSAL CLEAR (GAUZE/BANDAGES/DRESSINGS) ×2 IMPLANT
GLOVE BIOGEL PI IND STRL 6.5 (GLOVE) ×1 IMPLANT
GLOVE BIOGEL PI IND STRL 7.0 (GLOVE) ×1 IMPLANT
GLOVE BIOGEL PI INDICATOR 6.5 (GLOVE) ×1
GLOVE BIOGEL PI INDICATOR 7.0 (GLOVE) ×1
GLOVE EXAM NITRILE LRG STRL (GLOVE) IMPLANT
GLOVE EXAM NITRILE MD LF STRL (GLOVE) IMPLANT
KIT VITRECTOMY (OPHTHALMIC RELATED) IMPLANT
NEEDLE HYPO 18GX1.5 BLUNT FILL (NEEDLE) ×4 IMPLANT
PAD ARMBOARD 7.5X6 YLW CONV (MISCELLANEOUS) ×2 IMPLANT
PROC W NO LENS (INTRAOCULAR LENS)
PROC W SPEC LENS (INTRAOCULAR LENS)
PROCESS W NO LENS (INTRAOCULAR LENS) IMPLANT
PROCESS W SPEC LENS (INTRAOCULAR LENS) IMPLANT
RETRACTOR IRIS SIGHTPATH (OPHTHALMIC RELATED) IMPLANT
RING MALYGIN (MISCELLANEOUS) IMPLANT
SIGHTPATH CAT PROC W REG LENS (Ophthalmic Related) ×2 IMPLANT
SYRINGE LUER LOK 1CC (MISCELLANEOUS) ×2 IMPLANT
VISCOELASTIC ADDITIONAL (OPHTHALMIC RELATED) IMPLANT
WATER STERILE IRR 250ML POUR (IV SOLUTION) ×2 IMPLANT

## 2016-01-30 NOTE — Discharge Instructions (Signed)
Monitored Anesthesia Care, Care After °These instructions provide you with information about caring for yourself after your procedure. Your health care provider may also give you more specific instructions. Your treatment has been planned according to current medical practices, but problems sometimes occur. Call your health care provider if you have any problems or questions after your procedure. °What can I expect after the procedure? °After your procedure, it is common to: °· Feel sleepy for several hours. °· Feel clumsy and have poor balance for several hours. °· Feel forgetful about what happened after the procedure. °· Have poor judgment for several hours. °· Feel nauseous or vomit. °· Have a sore throat if you had a breathing tube during the procedure. °Follow these instructions at home: °For at least 24 hours after the procedure:  °· Do not: °¨ Participate in activities in which you could fall or become injured. °¨ Drive. °¨ Use heavy machinery. °¨ Drink alcohol. °¨ Take sleeping pills or medicines that cause drowsiness. °¨ Make important decisions or sign legal documents. °¨ Take care of children on your own. °· Rest. °Eating and drinking °· Follow the diet that is recommended by your health care provider. °· If you vomit, drink water, juice, or soup when you can drink without vomiting. °· Make sure you have little or no nausea before eating solid foods. °General instructions °· Have a responsible adult stay with you until you are awake and alert. °· Take over-the-counter and prescription medicines only as told by your health care provider. °· If you smoke, do not smoke without supervision. °· Keep all follow-up visits as told by your health care provider. This is important. °Contact a health care provider if: °· You keep feeling nauseous or you keep vomiting. °· You feel light-headed. °· You develop a rash. °· You have a fever. °Get help right away if: °· You have trouble breathing. °This information is not  intended to replace advice given to you by your health care provider. Make sure you discuss any questions you have with your health care provider. °Document Released: 04/28/2015 Document Revised: 08/28/2015 Document Reviewed: 04/28/2015 °Elsevier Interactive Patient Education © 2017 [employer] ° °

## 2016-01-30 NOTE — Anesthesia Postprocedure Evaluation (Signed)
Anesthesia Post Note  Patient: Christopher Lang  Procedure(s) Performed: Procedure(s) (LRB): CATARACT EXTRACTION PHACO AND INTRAOCULAR LENS PLACEMENT LEFT EYE CDE 3.13 (Left)  Patient location during evaluation: Short Stay Anesthesia Type: MAC Level of consciousness: awake, oriented and patient cooperative Pain management: pain level controlled Vital Signs Assessment: post-procedure vital signs reviewed and stable Respiratory status: spontaneous breathing, nonlabored ventilation and respiratory function stable Cardiovascular status: blood pressure returned to baseline Postop Assessment: no signs of nausea or vomiting Anesthetic complications: no     Last Vitals:  Vitals:   01/30/16 0930 01/30/16 0935  BP: (!) 99/52   Pulse:    Resp: 13 20  Temp:      Last Pain:  Vitals:   01/30/16 0804  TempSrc: Oral                 [name]

## 2016-01-30 NOTE — H&P (Signed)
I have reviewed the H&P, the patient was re-examined, and I have identified no interval changes in medical condition and plan of care since the history and physical of record  

## 2016-01-30 NOTE — Anesthesia Preprocedure Evaluation (Signed)
Anesthesia Evaluation  Patient identified by MRN, date of birth, ID band Patient awake    Reviewed: Allergy & Precautions, NPO status , Patient's Chart, lab work & pertinent test results  Airway Mallampati: II  TM Distance: >3 FB     Dental  (+) Teeth Intact   Pulmonary sleep apnea , COPD, Current Smoker,    breath sounds clear to auscultation       Cardiovascular hypertension, Pt. on medications + dysrhythmias (hx AF with RVR)  Rhythm:Regular Rate:Normal     Neuro/Psych    GI/Hepatic negative GI ROS,   Endo/Other    Renal/GU      Musculoskeletal   Abdominal   Peds  Hematology   Anesthesia Other Findings   Reproductive/Obstetrics                             Anesthesia Physical Anesthesia Plan  ASA: III  Anesthesia Plan: MAC   Post-op Pain Management:    Induction: Intravenous  Airway Management Planned: Nasal Cannula  Additional Equipment:   Intra-op Plan:   Post-operative Plan:   Informed Consent: I have reviewed the patients History and Physical, chart, labs and discussed the procedure including the risks, benefits and alternatives for the proposed anesthesia with the patient or authorized representative who has indicated his/her understanding and acceptance.     Plan Discussed with:   Anesthesia Plan Comments:         Anesthesia Quick Evaluation

## 2016-01-30 NOTE — Op Note (Signed)
Date of Admission: 01/30/2016  Date of Surgery: 01/30/2016  Pre-Op Dx: Cataract Left  Eye  Post-Op Dx: Senile Combined Cataract  Left  Eye,  Dx Code L84.536  Surgeon: Tonny Branch, M.D.  Assistants: None  Anesthesia: Topical with MAC  Indications: Painless, progressive loss of vision with compromise of daily activities.  Surgery: Cataract Extraction with Intraocular lens Implant Left Eye  Discription: The patient had dilating drops and viscous lidocaine placed into the Left eye in the pre-op holding area. After transfer to the operating room, a time out was performed. The patient was then prepped and draped. Beginning with a 50 degree blade a paracentesis port was made at the surgeon's 2 o'clock position. The anterior chamber was then filled with 1% non-preserved lidocaine. This was followed by filling the anterior chamber with Provisc.  A 2.46m keratome blade was used to make a clear corneal incision at the temporal limbus.  A bent cystatome needle was used to create a continuous tear capsulotomy. Hydrodissection was performed with balanced salt solution on a Fine canula. The lens nucleus was then removed using the phacoemulsification handpiece. Residual cortex was removed with the I&A handpiece. The anterior chamber and capsular bag were refilled with Provisc. A posterior chamber intraocular lens was placed into the capsular bag with it's injector. The implant was positioned with the Kuglan hook. The Provisc was then removed from the anterior chamber and capsular bag with the I&A handpiece. Stromal hydration of the main incision and paracentesis port was performed with BSS on a Fine canula. The wounds were tested for leak which was negative. The patient tolerated the procedure well. There were no operative complications. The patient was then transferred to the recovery room in stable condition.  Complications: None  Specimen: None  EBL: None  Prosthetic device: Abbott Technis, PCB00, power  21.0, SN [id number]

## 2016-01-30 NOTE — Transfer of Care (Signed)
Immediate Anesthesia Transfer of Care Note  Patient: Christopher Lang  Procedure(s) Performed: Procedure(s) with comments: CATARACT EXTRACTION PHACO AND INTRAOCULAR LENS PLACEMENT LEFT EYE CDE 3.13 (Left) - left  Patient Location: Short Stay  Anesthesia Type:MAC  Level of Consciousness: awake and patient cooperative  Airway & Oxygen Therapy: Patient Spontanous Breathing  Post-op Assessment: Report given to RN, Post -op Vital signs reviewed and stable and Patient moving all extremities  Post vital signs: Reviewed and stable  Last Vitals:  Vitals:   01/30/16 0930 01/30/16 0935  BP: (!) 99/52   Pulse:    Resp: 13 20  Temp:      Last Pain:  Vitals:   01/30/16 0804  TempSrc: Oral      Patients Stated Pain Goal: 7 (82/80/03 4917)  Complications: No apparent anesthesia complications

## 2016-01-31 ENCOUNTER — Encounter (HOSPITAL_COMMUNITY): Payer: Self-pay | Admitting: Ophthalmology

## 2016-05-25 DIAGNOSIS — H40013 Open angle with borderline findings, low risk, bilateral: Secondary | ICD-10-CM | POA: Diagnosis not present

## 2016-05-27 DIAGNOSIS — Z0000 Encounter for general adult medical examination without abnormal findings: Secondary | ICD-10-CM | POA: Diagnosis not present

## 2016-05-27 DIAGNOSIS — E7800 Pure hypercholesterolemia, unspecified: Secondary | ICD-10-CM | POA: Diagnosis not present

## 2016-05-27 DIAGNOSIS — Z7982 Long term (current) use of aspirin: Secondary | ICD-10-CM | POA: Diagnosis not present

## 2016-05-27 DIAGNOSIS — I10 Essential (primary) hypertension: Secondary | ICD-10-CM | POA: Diagnosis not present

## 2016-05-27 DIAGNOSIS — Z79899 Other long term (current) drug therapy: Secondary | ICD-10-CM | POA: Diagnosis not present

## 2016-05-27 DIAGNOSIS — I4891 Unspecified atrial fibrillation: Secondary | ICD-10-CM | POA: Diagnosis not present

## 2016-05-27 DIAGNOSIS — R0989 Other specified symptoms and signs involving the circulatory and respiratory systems: Secondary | ICD-10-CM | POA: Diagnosis not present

## 2016-05-27 DIAGNOSIS — R69 Illness, unspecified: Secondary | ICD-10-CM | POA: Diagnosis not present

## 2016-06-16 ENCOUNTER — Encounter: Payer: Self-pay | Admitting: Cardiology

## 2016-06-16 NOTE — Progress Notes (Signed)
Cardiology Office Note  Date: 06/17/2016   ID: Rayford, Williamsen 1956/01/08, MRN [id number]  PCP: Celedonio Savage, MD  Primary Cardiologist: Rozann Lesches, MD   Chief Complaint  Patient presents with  . History of atrial fibrillation    History of Present Illness: Christopher Lang is a 61 y.o. male seen in consultation back in December 2017. He does not report any palpitations, chest pain, shortness of breath, or lightheadedness.  CHADSVASC score is 1. His heart rate is regular today. In the absence of finding any recurring atrial fibrillation, he will stay on aspirin and Cardizem CD, we will continue with plan for observation.  He did ultimately follow up with cataract surgery since our visit in December 2017.  Past Medical History:  Diagnosis Date  . COPD (chronic obstructive pulmonary disease) ([place])   . Essential hypertension   . History of atrial fibrillation    November 2017  . Hyperlipidemia   . Sleep apnea     Current Outpatient Prescriptions  Medication Sig Dispense Refill  . aspirin EC 81 MG tablet Take 81 mg by mouth daily.    Marland Kitchen atorvastatin (LIPITOR) 10 MG tablet Take 1 tablet (10 mg total) by mouth daily at 6 PM. 30 tablet 0  . cholecalciferol (VITAMIN D) 1000 units tablet Take 1,000 Units by mouth daily.    Marland Kitchen diltiazem (CARDIZEM CD) 180 MG 24 hr capsule Take 1 capsule (180 mg total) by mouth daily. 30 capsule 0  . fish oil-omega-3 fatty acids 1000 MG capsule Take 1,000 mg by mouth daily.      Marland Kitchen losartan-hydrochlorothiazide (HYZAAR) 100-25 MG tablet Take 1 tablet by mouth daily.    . psyllium (METAMUCIL) 58.6 % powder Take 1 packet by mouth daily.     No current facility-administered medications for this visit.    Allergies:  Patient has no known allergies.   Social History: The patient  reports that he has been smoking Cigarettes.  He has a 25.00 pack-year smoking history. He has never used smokeless tobacco. He reports that he drinks about 12.0 oz of  alcohol per week . He reports that he does not use drugs.   ROS:  Please see the history of present illness. Otherwise, complete review of systems is positive for none.  All other systems are reviewed and negative.   Physical Exam: VS:  BP 138/78   Pulse 76   Ht 6' (1.829 m)   Wt 207 lb (93.9 kg)   SpO2 94%   BMI 28.07 kg/m , BMI Body mass index is 28.07 kg/m.  Wt Readings from Last 3 Encounters:  06/17/16 207 lb (93.9 kg)  01/30/16 206 lb (93.4 kg)  01/16/16 206 lb (93.4 kg)    General: Patient appears comfortable at rest. HEENT: Conjunctiva and lids normal, oropharynx clear with moist mucosa. Neck: Supple, no elevated JVP or carotid bruits, no thyromegaly. Lungs: Clear to auscultation, nonlabored breathing at rest. Cardiac: Regular rate and rhythm, no S3 or significant systolic murmur, no pericardial rub. Abdomen: Soft, nontender, bowel sounds present, no guarding or rebound. Extremities: No pitting edema, distal pulses 2+.  ECG: I personally reviewed the tracing from 12/03/2015 which showed sinus bradycardia with nonspecific ST changes.  Recent Labwork: 12/02/2015: ALT 27; AST 29; Magnesium 1.8; TSH 1.561 12/03/2015: BUN 16; Creatinine, Ser 0.86; Potassium 3.9; Sodium 136 12/04/2015: Hemoglobin 14.8; Platelets 232     Component Value Date/Time   CHOL 147 12/03/2015 0255   TRIG 321 (H) 12/03/2015 0255  HDL 27 (L) 12/03/2015 0255   CHOLHDL 5.4 12/03/2015 0255   VLDL 64 (H) 12/03/2015 0255   LDLCALC 56 12/03/2015 0255    Other Studies Reviewed Today:  Echocardiogram 12/03/2015: Study Conclusions  - Left ventricle: The cavity size was normal. Wall thickness was normal. Systolic function was normal. The estimated ejection fraction was in the range of 60% to 65%. Left ventricular diastolic function parameters were normal. - Aortic valve: Valve area (VTI): 1.74 cm^2. Valve area (Vmax): 1.81 cm^2. Valve area (Vmean): 1.58 cm^2. - Mitral valve: Calcified  annulus. Mildly thickened leaflets . - Left atrium: The atrium was mildly dilated. - Atrial septum: No defect or patent foramen ovale was identified.  Assessment and Plan:  1.  Episode of atrial fibrillation documented in the preoperative setting prior to cataract surgery planned late last year. CHADSVASC score is 1. He has had no obvious recurrences and feels no palpitations. At this point would recommend continuing aspirin and Cardizem CD.  2.  Essential hypertension, on Cozaar as well. Systolic blood pressure in the 130s today. No changes were made.   Current medicines were reviewed with the patient today.  Disposition:  Follow-up in one year.  Signed, Satira Sark, MD, [facility] 06/17/2016 11:02 AM    [place] [place]. [address] [place], [place] [address] Phone: [phone]; Fax: [phone]

## 2016-06-17 ENCOUNTER — Ambulatory Visit (INDEPENDENT_AMBULATORY_CARE_PROVIDER_SITE_OTHER): Payer: Medicare HMO | Admitting: Cardiology

## 2016-06-17 ENCOUNTER — Encounter: Payer: Self-pay | Admitting: Cardiology

## 2016-06-17 VITALS — BP 138/78 | HR 76 | Ht 72.0 in | Wt 207.0 lb

## 2016-06-17 DIAGNOSIS — I10 Essential (primary) hypertension: Secondary | ICD-10-CM | POA: Diagnosis not present

## 2016-06-17 DIAGNOSIS — Z8679 Personal history of other diseases of the circulatory system: Secondary | ICD-10-CM | POA: Diagnosis not present

## 2016-06-17 NOTE — Patient Instructions (Signed)
Your physician wants you to follow-up in:  1 year Dr McDowell You will receive a reminder letter in the mail two months in advance. If you don't receive a letter, please call our office to schedule the follow-up appointment.    Your physician recommends that you continue on your current medications as directed. Please refer to the Current Medication list given to you today.    If you need a refill on your cardiac medications before your next appointment, please call your pharmacy.    Thank you for choosing [facility] !        

## 2016-08-18 ENCOUNTER — Ambulatory Visit: Payer: Medicare HMO | Admitting: Family Medicine

## 2016-08-19 HISTORY — PX: OTHER SURGICAL HISTORY: SHX169

## 2016-09-03 ENCOUNTER — Ambulatory Visit (INDEPENDENT_AMBULATORY_CARE_PROVIDER_SITE_OTHER): Payer: Medicare HMO | Admitting: Family Medicine

## 2016-09-03 ENCOUNTER — Encounter: Payer: Self-pay | Admitting: Family Medicine

## 2016-09-03 ENCOUNTER — Encounter: Payer: Self-pay | Admitting: Gastroenterology

## 2016-09-03 VITALS — BP 122/68 | HR 68 | Temp 98.3°F | Resp 16 | Ht 72.0 in | Wt 208.2 lb

## 2016-09-03 DIAGNOSIS — I10 Essential (primary) hypertension: Secondary | ICD-10-CM | POA: Diagnosis not present

## 2016-09-03 DIAGNOSIS — F17210 Nicotine dependence, cigarettes, uncomplicated: Secondary | ICD-10-CM

## 2016-09-03 DIAGNOSIS — Z1159 Encounter for screening for other viral diseases: Secondary | ICD-10-CM | POA: Diagnosis not present

## 2016-09-03 DIAGNOSIS — J449 Chronic obstructive pulmonary disease, unspecified: Secondary | ICD-10-CM

## 2016-09-03 DIAGNOSIS — Z125 Encounter for screening for malignant neoplasm of prostate: Secondary | ICD-10-CM | POA: Diagnosis not present

## 2016-09-03 DIAGNOSIS — I4891 Unspecified atrial fibrillation: Secondary | ICD-10-CM

## 2016-09-03 DIAGNOSIS — E782 Mixed hyperlipidemia: Secondary | ICD-10-CM

## 2016-09-03 DIAGNOSIS — D126 Benign neoplasm of colon, unspecified: Secondary | ICD-10-CM

## 2016-09-03 DIAGNOSIS — R69 Illness, unspecified: Secondary | ICD-10-CM | POA: Diagnosis not present

## 2016-09-03 LAB — CBC
HCT: 36.2 % — ABNORMAL LOW (ref 38.5–50.0)
Hemoglobin: 12.4 g/dL — ABNORMAL LOW (ref 13.2–17.1)
MCH: 32.1 pg (ref 27.0–33.0)
MCHC: 34.3 g/dL (ref 32.0–36.0)
MCV: 93.8 fL (ref 80.0–100.0)
MPV: 10.8 fL (ref 7.5–12.5)
Platelets: 247 10*3/uL (ref 140–400)
RBC: 3.86 MIL/uL — ABNORMAL LOW (ref 4.20–5.80)
RDW: 12.9 % (ref 11.0–15.0)
WBC: 11 10*3/uL — ABNORMAL HIGH (ref 3.8–10.8)

## 2016-09-03 NOTE — Progress Notes (Signed)
Chief Complaint  Patient presents with  . Establish Care   61 year old here for first visit Says he is healthy and sees his doctor once a year Is a long time smoker, 40+ pack years.  Never screened for lung cancer.  Known COPD.  Does not feel able or motivated to quit.  I have discussed the multiple health risks associated with cigarette smoking including, but not limited to, cardiovascular disease, lung disease and cancer.  I have strongly recommended that smoking be stopped.  I have reviewed the various methods of quitting including cold [place], classes, nicotine replacements and prescription medications.  I have offered assistance in this difficult process.  The patient is not interested in assistance at this time. He agrees to a screening CT. He has hypertension and hyperlipidemia, controlled.  Denies any CVD.  No chest pain or palpitations.  He has history of a fib, treated, one episode without recurrence. He says he is up to date with colon cancer screening with a colonoscopy in 2012.  I looked up the pathology and the notes from Dr Oneida Alar an dhe had a high grade adenoma and was asked to come back in 3 years.  He is made a follow up with Dr fields. He says he has had his flu, pneumonia and shingles shots He gets no exercise.  Says his legs hurt when he walks any distance.  Not typical for claudication - it is calves and thighs.  Says his toes are numb all the time.  No history of spinal problems.  It has never been worked up.     Patient Active Problem List   Diagnosis Date Noted  . Colon adenoma 09/03/2016  . Smokes with greater than 30 pack year history 09/03/2016  . Atrial fibrillation with rapid ventricular response ([place]) 12/02/2015  . Essential hypertension 12/02/2015  . Hyperlipidemia 12/02/2015  . COPD (chronic obstructive pulmonary disease) ([place]) 12/02/2015  . Tobacco abuse 12/02/2015    Outpatient Encounter Prescriptions as of 09/03/2016  Medication Sig  . aspirin EC 81 MG  tablet Take 81 mg by mouth daily.  Marland Kitchen atorvastatin (LIPITOR) 10 MG tablet Take 1 tablet (10 mg total) by mouth daily at 6 PM.  . cholecalciferol (VITAMIN D) 1000 units tablet Take 1,000 Units by mouth daily.  Marland Kitchen diltiazem (CARDIZEM CD) 180 MG 24 hr capsule Take 1 capsule (180 mg total) by mouth daily.  . fish oil-omega-3 fatty acids 1000 MG capsule Take 1,000 mg by mouth daily.    Marland Kitchen losartan-hydrochlorothiazide (HYZAAR) 100-25 MG tablet Take 1 tablet by mouth daily.  . psyllium (METAMUCIL) 58.6 % powder Take 1 packet by mouth daily.   No facility-administered encounter medications on file as of 09/03/2016.     Past Medical History:  Diagnosis Date  . Cataract    had removed Dec 2017/Jan 2018  . COPD (chronic obstructive pulmonary disease) ([place])   . Essential hypertension   . History of atrial fibrillation    November 2017  . Hyperlipidemia   . Sleep apnea    COULDNT TOLERATE CPAP    Past Surgical History:  Procedure Laterality Date  . CATARACT EXTRACTION W/PHACO Right 01/16/2016   Procedure: CATARACT EXTRACTION PHACO AND INTRAOCULAR LENS PLACEMENT RIGHT EYE CDE= 6.97;  Surgeon: Tonny Branch, MD;  Location: [facility];  Service: Ophthalmology;  Laterality: Right;  right  . CATARACT EXTRACTION W/PHACO Left 01/30/2016   Procedure: CATARACT EXTRACTION PHACO AND INTRAOCULAR LENS PLACEMENT LEFT EYE CDE 3.13;  Surgeon: Tonny Branch,  MD;  Location: [facility];  Service: Ophthalmology;  Laterality: Left;  left  . NO PAST SURGERIES      Social History   Social History  . Marital status: Married    Spouse name: [name]  . Number of children: 2  . Years of education: GED   Occupational History  . disability     vision - dump truck   Social History Main Topics  . Smoking status: Current Some Day Smoker    Packs/day: 1.00    Years: 25.00    Types: Cigarettes  . Smokeless tobacco: Never Used  . Alcohol use No     Comment: Quit in November  . Drug use: No  . Sexual activity: Yes    Birth  control/ protection: Post-menopausal   Other Topics Concern  . Not on file   Social History Narrative   Lives with wife [name]   Leisure - couch potato    Family History  Problem Relation Age of Onset  . CAD Mother   . High blood pressure Mother   . Heart attack Mother   . Leukemia Father   . Early death Father   . Heart attack Sister   . Diabetes Sister   . Cancer Brother   . Deep vein thrombosis Brother   . Hypertension Daughter   . Stroke Sister   . Stroke Sister   . Heart attack Sister   . Deep vein thrombosis Sister   . Hypertension Sister   . Diabetes Sister   . Arthritis Sister   . Arthritis Sister   . Arthritis Sister   . Hypertension Sister   . Heart attack Sister   . Cancer Brother        KIDNEY  . Kidney disease Brother   . Deep vein thrombosis Brother   . Seizures Brother   . Heart attack Brother     Review of Systems  Constitutional: Positive for malaise/fatigue. Negative for chills, fever and weight loss.       Legs  HENT: Negative for congestion and hearing loss.   Eyes: Negative for blurred vision and pain.  Respiratory: Positive for shortness of breath. Negative for cough.   Cardiovascular: Negative for chest pain and leg swelling.  Gastrointestinal: Negative for abdominal pain, constipation, diarrhea and heartburn.  Genitourinary: Negative for dysuria and frequency.  Musculoskeletal: Positive for myalgias. Negative for falls and joint pain.       Leg pains with exertion  Neurological: Positive for sensory change and weakness. Negative for dizziness, seizures and headaches.  Psychiatric/Behavioral: Negative for depression. The patient is not nervous/anxious and does not have insomnia.     BP 122/68 (BP Location: Left Arm, Patient Position: Sitting, Cuff Size: Normal)   Pulse 68   Temp 98.3 F (36.8 C) (Other (Comment))   Resp 16   Ht 6' (1.829 m)   Wt 208 lb 4 oz (94.5 kg)   SpO2 96%   BMI 28.24 kg/m   Physical Exam  Constitutional:  He is oriented to person, place, and time. He appears well-developed and well-nourished.  HENT:  Head: Normocephalic and atraumatic.  Mouth/Throat: Oropharynx is clear and moist.  Eyes: Pupils are equal, round, and reactive to light. Conjunctivae are normal.  Neck: Normal range of motion. Neck supple. No thyromegaly present.  Cardiovascular: Normal rate, regular rhythm and normal heart sounds.   Pulmonary/Chest: Effort normal and breath sounds normal. No respiratory distress.  Abdominal: Soft. Bowel sounds are normal.  Musculoskeletal: Normal range  of motion. He exhibits no edema.  Lymphadenopathy:    He has no cervical adenopathy.  Neurological: He is alert and oriented to person, place, and time.  Gait normal  Skin: Skin is warm and dry.  Cutaneous horn right htigh  Psychiatric: He has a normal mood and affect. His behavior is normal. Thought content normal.  Nursing note and vitals reviewed.  ASSESSMENT/PLAN:  1. Colon adenoma - Ambulatory referral to Gastroenterology  2. Smokes with greater than 30 pack year history - CT CHEST LUNG CA SCREEN LOW DOSE W/O CM; Future  3. Essential hypertension - COMPLETE METABOLIC PANEL WITH GFR - CBC - Urinalysis, Routine w reflex microscopic  4. Atrial fibrillation with rapid ventricular response ([facility]) - Lipid panel  5. Chronic obstructive pulmonary disease, unspecified COPD type ([place])  6. Mixed hyperlipidemia  7. Screening for prostate cancer - PSA  8. Encounter for hepatitis C screening test for low risk patient - Hepatitis C antibody Greater than 50% of this visit was spent in counseling and coordinating care.  Total face to face time:  45 min sent discussing smoking, health concerns, leg pains and Ddx   future worup   Patient Instructions  No change in medicine  Continue to exercise to tolerance daily  Need labs today  CT chest ordered for lung cancer screening  Referred got colonoscopy Dr fields  See me in one month  for PE  Call for problems or refills  Need records Dr Wenda Overland      [name], MD

## 2016-09-03 NOTE — Patient Instructions (Signed)
No change in medicine  Continue to exercise to tolerance daily  Need labs today  CT chest ordered for lung cancer screening  Referred got colonoscopy Dr fields  See me in one month for PE  Call for problems or refills  Need records Dr Wenda Overland

## 2016-09-04 ENCOUNTER — Encounter: Payer: Self-pay | Admitting: Family Medicine

## 2016-09-04 LAB — URINALYSIS, ROUTINE W REFLEX MICROSCOPIC
Bilirubin Urine: NEGATIVE
Glucose, UA: NEGATIVE
Hgb urine dipstick: NEGATIVE
Ketones, ur: NEGATIVE
Leukocytes, UA: NEGATIVE
Nitrite: NEGATIVE
Protein, ur: NEGATIVE
Specific Gravity, Urine: 1.02 (ref 1.001–1.035)
pH: 7 (ref 5.0–8.0)

## 2016-09-04 LAB — LIPID PANEL
Cholesterol: 137 mg/dL (ref <200)
HDL: 31 mg/dL — ABNORMAL LOW (ref >40)
LDL Cholesterol: 83 mg/dL (ref <100)
Total CHOL/HDL Ratio: 4.4 Ratio (ref <5.0)
Triglycerides: 114 mg/dL (ref <150)
VLDL: 23 mg/dL (ref <30)

## 2016-09-04 LAB — COMPLETE METABOLIC PANEL WITH GFR
ALT: 23 U/L (ref 9–46)
AST: 22 U/L (ref 10–35)
Albumin: 4.5 g/dL (ref 3.6–5.1)
Alkaline Phosphatase: 111 U/L (ref 40–115)
BUN: 11 mg/dL (ref 7–25)
CO2: 24 mmol/L (ref 20–32)
Calcium: 9.8 mg/dL (ref 8.6–10.3)
Chloride: 102 mmol/L (ref 98–110)
Creat: 0.89 mg/dL (ref 0.70–1.25)
GFR, Est African American: >89 mL/min (ref >=60)
GFR, Est Non African American: >89 mL/min (ref >=60)
Glucose, Bld: 110 mg/dL — ABNORMAL HIGH (ref 65–99)
Potassium: 4 mmol/L (ref 3.5–5.3)
Sodium: 138 mmol/L (ref 135–146)
Total Bilirubin: 0.5 mg/dL (ref 0.2–1.2)
Total Protein: 6.9 g/dL (ref 6.1–8.1)

## 2016-09-04 LAB — HEPATITIS C ANTIBODY: HCV Ab: NONREACTIVE

## 2016-09-04 LAB — PSA: PSA: 0.6 ng/mL (ref <=4.0)

## 2016-09-17 ENCOUNTER — Ambulatory Visit (HOSPITAL_COMMUNITY): Payer: Medicare HMO

## 2016-09-28 ENCOUNTER — Ambulatory Visit (HOSPITAL_COMMUNITY)
Admission: RE | Admit: 2016-09-28 | Discharge: 2016-09-28 | Disposition: A | Discharge status: Home / Self Care | Payer: Medicare HMO | Source: Ambulatory Visit | Attending: Family Medicine | Admitting: Family Medicine

## 2016-09-28 DIAGNOSIS — I700 Atherosclerosis of aorta: Secondary | ICD-10-CM | POA: Diagnosis not present

## 2016-09-28 DIAGNOSIS — Z122 Encounter for screening for malignant neoplasm of respiratory organs: Secondary | ICD-10-CM | POA: Diagnosis not present

## 2016-09-28 DIAGNOSIS — F17210 Nicotine dependence, cigarettes, uncomplicated: Secondary | ICD-10-CM

## 2016-09-28 DIAGNOSIS — J439 Emphysema, unspecified: Secondary | ICD-10-CM | POA: Insufficient documentation

## 2016-09-28 DIAGNOSIS — R69 Illness, unspecified: Secondary | ICD-10-CM | POA: Diagnosis not present

## 2016-09-29 ENCOUNTER — Encounter: Payer: Self-pay | Admitting: Family Medicine

## 2016-09-29 DIAGNOSIS — I700 Atherosclerosis of aorta: Secondary | ICD-10-CM

## 2016-10-06 ENCOUNTER — Encounter: Payer: Self-pay | Admitting: Family Medicine

## 2016-10-06 ENCOUNTER — Ambulatory Visit (INDEPENDENT_AMBULATORY_CARE_PROVIDER_SITE_OTHER): Payer: Medicare HMO | Admitting: Family Medicine

## 2016-10-06 ENCOUNTER — Other Ambulatory Visit (HOSPITAL_COMMUNITY)
Admission: RE | Admit: 2016-10-06 | Discharge: 2016-10-06 | Disposition: A | Discharge status: Home / Self Care | Payer: Medicare HMO | Source: Ambulatory Visit | Attending: Family Medicine | Admitting: Family Medicine

## 2016-10-06 VITALS — BP 152/78 | HR 76 | Temp 98.0°F | Resp 18 | Ht 72.0 in | Wt 206.0 lb

## 2016-10-06 DIAGNOSIS — L858 Other specified epidermal thickening: Secondary | ICD-10-CM

## 2016-10-06 DIAGNOSIS — B079 Viral wart, unspecified: Secondary | ICD-10-CM | POA: Diagnosis not present

## 2016-10-06 DIAGNOSIS — E782 Mixed hyperlipidemia: Secondary | ICD-10-CM

## 2016-10-06 DIAGNOSIS — Z23 Encounter for immunization: Secondary | ICD-10-CM | POA: Diagnosis not present

## 2016-10-06 DIAGNOSIS — I10 Essential (primary) hypertension: Secondary | ICD-10-CM | POA: Diagnosis not present

## 2016-10-06 DIAGNOSIS — Z720 Tobacco use: Secondary | ICD-10-CM | POA: Diagnosis not present

## 2016-10-06 MED ORDER — ATORVASTATIN CALCIUM 10 MG PO TABS: 10.0000 mg | ORAL_TABLET | Freq: Every day | ORAL | 3 refills | Status: DC

## 2016-10-06 MED ORDER — LOSARTAN POTASSIUM-HCTZ 100-25 MG PO TABS: 1.0000 | ORAL_TABLET | Freq: Every day | ORAL | 3 refills | Status: DC

## 2016-10-06 MED ORDER — DILTIAZEM HCL ER COATED BEADS 180 MG PO CP24: 180.0000 mg | ORAL_CAPSULE | Freq: Every day | ORAL | 3 refills | Status: DC

## 2016-10-06 NOTE — Progress Notes (Signed)
Chief Complaint  Patient presents with  . Annual Exam  here for PE Also lesion R thigh is suspicious for pre cancer cutaneous horn and needs removal Labs done/reviewed He was referred for colonoscopy CT chest for lung cancer reviewed.   He has emphysema and atherosclerosis.  The need to quit smoking is again emphasized.   Patient Active Problem List   Diagnosis Date Noted  . Atherosclerosis of aorta ([place]) 09/29/2016  . Colon adenoma 09/03/2016  . Smokes with greater than 30 pack year history 09/03/2016  . Atrial fibrillation with rapid ventricular response ([place]) 12/02/2015  . Essential hypertension 12/02/2015  . Hyperlipidemia 12/02/2015  . COPD (chronic obstructive pulmonary disease) ([place]) 12/02/2015  . Tobacco abuse 12/02/2015    Outpatient Encounter Prescriptions as of 10/06/2016  Medication Sig  . aspirin EC 81 MG tablet Take 81 mg by mouth daily.  Marland Kitchen atorvastatin (LIPITOR) 10 MG tablet Take 1 tablet (10 mg total) by mouth daily at 6 PM.  . cholecalciferol (VITAMIN D) 1000 units tablet Take 1,000 Units by mouth daily.  Marland Kitchen diltiazem (CARDIZEM CD) 180 MG 24 hr capsule Take 1 capsule (180 mg total) by mouth daily.  . fish oil-omega-3 fatty acids 1000 MG capsule Take 1,000 mg by mouth daily.    Marland Kitchen losartan-hydrochlorothiazide (HYZAAR) 100-25 MG tablet Take 1 tablet by mouth daily.  . psyllium (METAMUCIL) 58.6 % powder Take 1 packet by mouth daily.   No facility-administered encounter medications on file as of 10/06/2016.    No Known Allergies  Review of Systems  Constitutional: Negative for activity change, appetite change and unexpected weight change.  HENT: Negative for congestion and dental problem.   Eyes: Negative for photophobia and visual disturbance.  Respiratory: Positive for shortness of breath. Negative for cough and wheezing.        DOE  Cardiovascular: Negative for chest pain, palpitations and leg swelling.  Gastrointestinal: Negative for blood in stool,  constipation and diarrhea.  Genitourinary: Negative for difficulty urinating and frequency.  Musculoskeletal: Positive for gait problem. Negative for arthralgias and back pain.       Calves cramp when walking  Skin: Negative for color change.       Lesion thigh growing.  Neurological: Negative for dizziness and light-headedness.  Hematological: Negative for adenopathy. Does not bruise/bleed easily.  Psychiatric/Behavioral: Negative for sleep disturbance. The patient is not nervous/anxious.     BP (!) 152/78 (BP Location: Right Arm, Patient Position: Sitting, Cuff Size: Normal)   Pulse 76   Temp 98 F (36.7 C) (Temporal)   Resp 18   Ht 6' (1.829 m)   Wt 206 lb 0.6 oz (93.5 kg)   SpO2 97%   BMI 27.94 kg/m   Physical Exam   BP (!) 152/78 (BP Location: Right Arm, Patient Position: Sitting, Cuff Size: Normal)   Pulse 76   Temp 98 F (36.7 C) (Temporal)   Resp 18   Ht 6' (1.829 m)   Wt 206 lb 0.6 oz (93.5 kg)   SpO2 97%   BMI 27.94 kg/m   General Appearance:    Alert, cooperative, no distress, appears stated age  Head:    Normocephalic, without obvious abnormality, atraumatic  Eyes:    PERRL, conjunctiva/corneas clear, EOM's intact, fundi    benign, both eyes       Ears:    Normal TM's and external ear canals, both ears  Nose:   Nares normal, septum midline, mucosa normal, no drainage   or sinus  tenderness  Throat:   Lips, mucosa, and tongue normal;gums normal, plates  Neck:   Supple, symmetrical, trachea midline, no adenopathy;       thyroid:  No enlargement/tenderness/nodules; no carotid   bruit or JVD  Back:     Symmetric, no curvature, ROM normal, no CVA tenderness  Lungs:     Clear to auscultation bilaterally, respirations unlabored  Chest wall:    No tenderness or deformity  Heart:    Regular rate and rhythm, S1 and S2 normal, no murmur, rub   or gallop  Abdomen:     Soft, non-tender, bowel sounds active all four quadrants,    no masses, no organomegaly    Extremities:   Extremities normal, atraumatic, no cyanosis or edema  Pulses:   2+ and symmetric upper extremities, absent DP pulses feet  Skin:   Skin color, texture, turgor normal, no rashes   Lymph nodes:   Cervical, supraclavicular, and axillary nodes normal  Neurologic:    Normal strength, sensation and reflexes      Throughout  PROCEDURE: Time out Consent Right anterior thigh has pigmented 1.2 cm lesion with horny growth  anesthetized with 1% lidocaine Ellipse incision to remove in entirety Closed with interrupted sutures #2 Wound care discussed     ASSESSMENT/PLAN:  1. Essential hypertension  2. Mixed hyperlipidemia - atorvastatin (LIPITOR) 10 MG tablet; Take 1 tablet (10 mg total) by mouth daily at 6 PM.  Dispense: 90 tablet; Refill: 3 - diltiazem (CARDIZEM CD) 180 MG 24 hr capsule; Take 1 capsule (180 mg total) by mouth daily.  Dispense: 90 capsule; Refill: 3 - losartan-hydrochlorothiazide (HYZAAR) 100-25 MG tablet; Take 1 tablet by mouth daily.  Dispense: 90 tablet; Refill: 3 - CBC - COMPLETE METABOLIC PANEL WITH GFR - Lipid panel - Urinalysis, Routine w reflex microscopic  3. Need for influenza vaccination - Flu Vaccine QUAD 36+ mos IM  4. Tobacco abuse Advised to quit  5. Cutaneous horn - Dermatology pathology - PR EXC SKIN BENIG 1.1-2 CM TRUNK,ARM,LEG   Patient Instructions  Come back one week for suture removal  See me in six months for follow up   [name], MD

## 2016-10-06 NOTE — Patient Instructions (Signed)
Come back one week for suture removal  See me in six months for follow up

## 2016-10-07 NOTE — Progress Notes (Signed)
    Patient ID: Christopher Lang, male    DOB: [birth year], 61 y.o.   MRN: [id number]  No chief complaint on file.   Allergies Patient has no known allergies.  Subjective:   Christopher Lang is a 61 y.o. male who presents to [facility] today.  HPI HPI  Past Medical History:  Diagnosis Date  . Cataract    had removed Dec 2017/Jan 2018  . COPD (chronic obstructive pulmonary disease) ([place])   . Essential hypertension   . History of atrial fibrillation    November 2017  . Hyperlipidemia   . Sleep apnea    COULDNT TOLERATE CPAP    Past Surgical History:  Procedure Laterality Date  . CATARACT EXTRACTION W/PHACO Right 01/16/2016   Procedure: CATARACT EXTRACTION PHACO AND INTRAOCULAR LENS PLACEMENT RIGHT EYE CDE= 6.97;  Surgeon: Tonny Branch, MD;  Location: [facility];  Service: Ophthalmology;  Laterality: Right;  right  . CATARACT EXTRACTION W/PHACO Left 01/30/2016   Procedure: CATARACT EXTRACTION PHACO AND INTRAOCULAR LENS PLACEMENT LEFT EYE CDE 3.13;  Surgeon: Tonny Branch, MD;  Location: [facility];  Service: Ophthalmology;  Laterality: Left;  left  . NO PAST SURGERIES      Family History  Problem Relation Age of Onset  . CAD Mother   . High blood pressure Mother   . Heart attack Mother   . Leukemia Father   . Early death Father   . Heart attack Sister   . Diabetes Sister   . Cancer Brother   . Deep vein thrombosis Brother   . Hypertension Daughter   . Stroke Sister   . Stroke Sister   . Heart attack Sister   . Deep vein thrombosis Sister   . Hypertension Sister   . Diabetes Sister   . Arthritis Sister   . Arthritis Sister   . Arthritis Sister   . Hypertension Sister   . Heart attack Sister   . Cancer Brother        KIDNEY  . Kidney disease Brother   . Deep vein thrombosis Brother   . Seizures Brother   . Heart attack Brother      Social History   Social History  . Marital status: Married    Spouse name: [name]  . Number of children: 2  . Years of  education: GED   Occupational History  . disability     vision - dump truck   Social History Main Topics  . Smoking status: Current Some Day Smoker    Packs/day: 1.00    Years: 45.00    Types: Cigarettes  . Smokeless tobacco: Never Used  . Alcohol use No     Comment: Quit in November  . Drug use: No  . Sexual activity: Yes    Birth control/ protection: Post-menopausal   Other Topics Concern  . Not on file   Social History Narrative   Lives with wife [name]   Leisure - couch potato    Review of Systems   Objective:   There were no vitals taken for this visit.  Physical Exam   Assessment and Plan   Patient seen today to have his stitches removed from his right thigh.  Two stiches removed.  Patient tolerated well.  Ointment and strips placed.  Educated patient on care.  All questions answered.   No Follow-up on file. Amado Coe, [place] 10/07/2016

## 2016-10-13 ENCOUNTER — Ambulatory Visit (INDEPENDENT_AMBULATORY_CARE_PROVIDER_SITE_OTHER): Payer: Self-pay

## 2016-10-13 DIAGNOSIS — L858 Other specified epidermal thickening: Secondary | ICD-10-CM

## 2016-10-28 ENCOUNTER — Other Ambulatory Visit: Payer: Self-pay

## 2016-10-28 ENCOUNTER — Ambulatory Visit (INDEPENDENT_AMBULATORY_CARE_PROVIDER_SITE_OTHER): Payer: Medicare HMO | Admitting: Gastroenterology

## 2016-10-28 ENCOUNTER — Encounter: Payer: Self-pay | Admitting: Gastroenterology

## 2016-10-28 DIAGNOSIS — D126 Benign neoplasm of colon, unspecified: Secondary | ICD-10-CM | POA: Diagnosis not present

## 2016-10-28 DIAGNOSIS — D649 Anemia, unspecified: Secondary | ICD-10-CM

## 2016-10-28 MED ORDER — NA SULFATE-K SULFATE-MG SULF 17.5-3.13-1.6 GM/177ML PO SOLN: 1.0000 | ORAL | 0 refills | Status: DC

## 2016-10-28 NOTE — Assessment & Plan Note (Signed)
ETIOLOGY UNCLEAR AND MOST LIKELY DUE TO NSAID GASTRITIS, LESS LIKELY COLON POLYP OR CANCER OR B12/FOLATE DEFICIENCY. OVERDUE FOR SURVEILLANCE COLONOSCOPY.   COMPLETE LABS: CBC, B12, FOLATE, AND FERRITIN. COMPLETE COLONOSCOPY/UPPER ENDOSCOPY IN 2018. NEXT COLONOSCOPY IN 2021 OR 2023. FOLLOW A FULL LIQUID DIET ON DAY BEFORE COLONOSCOPY.  HANDOUT GIVEN. DISCUSSED PROCEDURE, BENEFITS, & RISKS: < 1% chance of medication reaction, bleeding, perforation, or rupture of spleen/liver. ALL SISTERS, BROTHERS, CHILDREN, AND PARENTS NEED TO HAVE A COLONOSCOPY STARTING AT THE AGE OF 40 and then every 5 years FOLLOW UP IN 1-2 YEARS.

## 2016-10-28 NOTE — Patient Instructions (Addendum)
COMPLETE LABS.  COMPLETE COLONOSCOPY/UPPER ENDOSCOPY IN 2018. NEXT COLONOSCOPY IN 2021 OR 2023. FOLLOW A FULL LIQUID DIET ON DAY BEFORE COLONOSCOPY. SEE INFO BELOW.  YOUR SISTERS, BROTHERS, CHILDREN, AND PARENTS NEED TO HAVE A COLONOSCOPY STARTING AT THE AGE OF 40 and then every 5 years   FOLLOW UP IN 1-2 YEARS.   Full Liquid Diet A high-calorie, high-protein supplement should be used to meet your nutritional requirements when the full liquid diet is continued for more than 2 or 3 days. If this diet is to be used for an extended period of time (more than 7 days), a multivitamin should be considered.  Breads and Starches  Allowed: None are allowed   Avoid: Any others.    Potatoes/Pasta/Rice  Allowed: ANY ITEM AS A SOUP OR SMALL PLATE OF MASHED POTATOES OR SCRAMBLED EGGS. (DO NOT EAT MORE THAN ONE SERVING ON THE DAY BEFORE COLONOSCOPY).      Vegetables  Allowed: Strained tomato or vegetable juice. Vegetables pureed in soup.   Avoid: Any others.    Fruit  Allowed: Any strained fruit juices and fruit drinks. Include 1 serving of citrus or vitamin C-enriched fruit juice daily.   Avoid: Any others.  Meat and Meat Substitutes  Allowed: Egg  Avoid: Any meat, fish, or fowl. All cheese.  Milk  Allowed: SOY Milk beverages, including milk shakes and instant breakfast mixes. Smooth yogurt.   Avoid: Any others. Avoid dairy products if not tolerated.    Soups and Combination Foods  Allowed: Broth, strained cream soups. Strained, broth-based soups.   Avoid: Any others.    Desserts and Sweets  Allowed: flavored gelatin, tapioca, ice cream, sherbet, smooth pudding, junket, fruit ices, frozen ice pops, pudding pops, frozen fudge pops, chocolate syrup. Sugar, honey, jelly, syrup.   Avoid: Any others.  Fats and Oils  Allowed: Margarine, butter, cream, sour cream, oils.   Avoid: Any others.  Beverages  Allowed: All.   Avoid: None.  Condiments  Allowed: Iodized  salt, pepper, spices, flavorings. Cocoa powder.   Avoid: Any others.    SAMPLE MEAL PLAN Breakfast   cup orange juice.   1 OR 2 EGGS  1 cup milk.   1 cup beverage (coffee or tea).   Cream or sugar, if desired.    Midmorning Snack  2 SCRAMBLED OR HARD BOILED EGG   Lunch  1 cup cream soup.    cup fruit juice.   1 cup milk.    cup custard.   1 cup beverage (coffee or tea).   Cream or sugar, if desired.    Midafternoon Snack  1 cup milk shake.  Dinner  1 cup cream soup.    cup fruit juice.   1 cup MILK    cup pudding.   1 cup beverage (coffee or tea).   Cream or sugar, if desired.  Evening Snack  1 cup supplement.  To increase calories, add sugar, cream, butter, or margarine if possible. Nutritional supplements will also increase the total calories.

## 2016-10-28 NOTE — Progress Notes (Signed)
cc'ed to pcp °

## 2016-10-28 NOTE — Progress Notes (Signed)
On recall  °

## 2016-10-28 NOTE — Progress Notes (Signed)
Subjective:    Patient ID: Christopher Lang, male    DOB: October 15, 1955, 61 y.o.   MRN: [id number]  Christopher Everts, MD  HPI LAST SEEN APR 2012: TVA RECTUM. BOWELS MOVING GOOD.   PT DENIES FEVER, CHILLS, HEMATOCHEZIA, HEMATEMESIS, nausea, vomiting, melena, diarrhea, CHEST PAIN, SHORTNESS OF BREATH,  CHANGE IN BOWEL IN HABITS, constipation, abdominal pain, problems swallowing, problems with sedation, heartburn or indigestion.    Past Medical History:  Diagnosis Date  . Cataract    had removed Dec 2017/Jan 2018  . COPD (chronic obstructive pulmonary disease) ([place])   . Essential hypertension   . History of atrial fibrillation    November 2017  . Hyperlipidemia   . Sleep apnea    COULDNT TOLERATE CPAP  . Tubulovillous adenoma of rectum 04/2010    Past Surgical History:  Procedure Laterality Date  . CATARACT EXTRACTION W/PHACO Right 01/16/2016   Procedure: CATARACT EXTRACTION PHACO AND INTRAOCULAR LENS PLACEMENT RIGHT EYE CDE= 6.97;  Surgeon: Tonny Branch, MD;  Location: [facility];  Service: Ophthalmology;  Laterality: Right;  right  . CATARACT EXTRACTION W/PHACO Left 01/30/2016   Procedure: CATARACT EXTRACTION PHACO AND INTRAOCULAR LENS PLACEMENT LEFT EYE CDE 3.13;  Surgeon: Tonny Branch, MD;  Location: [facility];  Service: Ophthalmology;  Laterality: Left;  left  . COLONOSCOPY  2012  . EXCISION, SKI  08/2016   R THIGH    No Known Allergies  Current Outpatient Prescriptions  Medication Sig Dispense Refill  . aspirin EC 81 MG tablet Take 81 mg by mouth daily.    Marland Kitchen atorvastatin (LIPITOR) 10 MG tablet Take 1 tablet (10 mg total) by mouth daily at 6 PM.    . cholecalciferol (VITAMIN D) 1000 units tablet Take 1,000 Units by mouth daily.    Marland Kitchen diltiazem (CARDIZEM CD) 180 MG 24 hr capsule Take 1 capsule (180 mg total) by mouth daily.    . fish oil-omega-3 fatty acids 1000 MG capsule Take 1,000 mg by mouth daily.      Marland Kitchen losartan-hydrochlorothiazide (HYZAAR) 100-25 MG tablet Take 1 tablet by  mouth daily.     Family History  Problem Relation Age of Onset  . CAD Mother   . High blood pressure Mother   . Heart attack Mother   . Leukemia Father   . Early death Father   . Heart attack Sister   . Diabetes Sister   . Cancer Brother   . Deep vein thrombosis Brother   . Hypertension Daughter   . Stroke Sister   . Stroke Sister   . Heart attack Sister   . Deep vein thrombosis Sister   . Hypertension Sister   . Diabetes Sister   . Arthritis Sister   . Arthritis Sister   . Arthritis Sister   . Hypertension Sister   . Heart attack Sister   . Cancer Brother        KIDNEY  . Kidney disease Brother   . Deep vein thrombosis Brother   . Seizures Brother   . Heart attack Brother   . Colon cancer Neg Hx   . Colon polyps Neg Hx     Social History  Substance Use Topics  . Smoking status: Current Some Day Smoker    Packs/day: 1.00    Years: 45.00    Types: Cigarettes  . Smokeless tobacco: Never Used  . Alcohol use No     Comment: Quit in November    Review of Systems PER HPI OTHERWISE ALL SYSTEMS  ARE NEGATIVE.    Objective:   Physical Exam  Constitutional: He is oriented to person, place, and time. He appears well-developed and well-nourished. No distress.  HENT:  Head: Normocephalic and atraumatic.  Mouth/Throat: Oropharynx is clear and moist. No oropharyngeal exudate.  Eyes: Pupils are equal, round, and reactive to light. No scleral icterus.  Neck: Normal range of motion. Neck supple.  Cardiovascular: Normal rate, regular rhythm and normal heart sounds.   Pulmonary/Chest: Effort normal and breath sounds normal. No respiratory distress.  Abdominal: Soft. Bowel sounds are normal. He exhibits no distension. There is no tenderness.  Musculoskeletal: He exhibits no edema.  Lymphadenopathy:    He has no cervical adenopathy.  Neurological: He is alert and oriented to person, place, and time.  NO FOCAL DEFICITS  Psychiatric: He has a normal mood and affect.  Vitals  reviewed.      Assessment & Plan:

## 2016-10-28 NOTE — Assessment & Plan Note (Signed)
OVERDUE DUE TO 4 SIMPLE ADENOMAS AND ONE ADVANCED ADENOMA REMOVED IN APR 2012. NO WARNING SIGNS/SYMPTOMS  COMPLETE TCS IN 2018.

## 2016-11-06 ENCOUNTER — Other Ambulatory Visit: Payer: Self-pay

## 2016-11-06 ENCOUNTER — Telehealth: Payer: Self-pay

## 2016-11-06 DIAGNOSIS — D126 Benign neoplasm of colon, unspecified: Secondary | ICD-10-CM | POA: Diagnosis not present

## 2016-11-06 DIAGNOSIS — D649 Anemia, unspecified: Secondary | ICD-10-CM | POA: Diagnosis not present

## 2016-11-06 DIAGNOSIS — R69 Illness, unspecified: Secondary | ICD-10-CM | POA: Diagnosis not present

## 2016-11-06 MED ORDER — PEG 3350-KCL-NA BICARB-NACL 420 G PO SOLR: 4000.0000 mL | ORAL | 0 refills | Status: DC

## 2016-11-06 NOTE — Telephone Encounter (Signed)
Pt came by office. Suprep was going to cost over $90. We currently are out of Suprep samples. Rx for Tri-Lyte sent to pharmacy. New instructions reviewed and given to pt.

## 2016-11-08 LAB — CBC WITH DIFFERENTIAL/PLATELET
Basophils Absolute: 33 cells/uL (ref 0–200)
Basophils Relative: 0.3 %
Eosinophils Absolute: 154 cells/uL (ref 15–500)
Eosinophils Relative: 1.4 %
HCT: 37.2 % — ABNORMAL LOW (ref 38.5–50.0)
Hemoglobin: 12.9 g/dL — ABNORMAL LOW (ref 13.2–17.1)
Lymphs Abs: 4026 cells/uL — ABNORMAL HIGH (ref 850–3900)
MCH: 31.5 pg (ref 27.0–33.0)
MCHC: 34.7 g/dL (ref 32.0–36.0)
MCV: 91 fL (ref 80.0–100.0)
MPV: 10.9 fL (ref 7.5–12.5)
Monocytes Relative: 6.4 %
Neutro Abs: 6083 cells/uL (ref 1500–7800)
Neutrophils Relative %: 55.3 %
Platelets: 286 10*3/uL (ref 140–400)
RBC: 4.09 10*6/uL — ABNORMAL LOW (ref 4.20–5.80)
RDW: 11.7 % (ref 11.0–15.0)
Total Lymphocyte: 36.6 %
WBC mixed population: 704 cells/uL (ref 200–950)
WBC: 11 10*3/uL — ABNORMAL HIGH (ref 3.8–10.8)

## 2016-11-08 LAB — VITAMIN B12: Vitamin B-12: 272 pg/mL (ref 200–1100)

## 2016-11-08 LAB — FOLATE RBC: RBC Folate: 562 ng/mL RBC (ref >280)

## 2016-11-08 LAB — FERRITIN: Ferritin: 431 ng/mL — ABNORMAL HIGH (ref 20–380)

## 2016-11-19 DIAGNOSIS — A048 Other specified bacterial intestinal infections: Secondary | ICD-10-CM

## 2016-11-19 HISTORY — DX: Other specified bacterial intestinal infections: A04.8

## 2016-11-26 NOTE — Progress Notes (Signed)
Pt is aware. Christopher Lang he was called from the hospital and told he would need to pay a $200.00 co-pay and he does not have the money. I told him to discuss with business office at [facility] and they usually will let you make payments. He said he is going to do so and he will let us know if there is a problem.

## 2016-12-04 ENCOUNTER — Ambulatory Visit (HOSPITAL_COMMUNITY)
Admission: RE | Admit: 2016-12-04 | Discharge: 2016-12-04 | Disposition: A | Discharge status: Home / Self Care | Payer: Medicare HMO | Source: Ambulatory Visit | Attending: Gastroenterology | Admitting: Gastroenterology

## 2016-12-04 ENCOUNTER — Encounter (HOSPITAL_COMMUNITY): Payer: Self-pay | Admitting: *Deleted

## 2016-12-04 ENCOUNTER — Encounter (HOSPITAL_COMMUNITY)
Admission: RE | Disposition: A | Discharge status: Home / Self Care | Payer: Self-pay | Source: Ambulatory Visit | Attending: Gastroenterology

## 2016-12-04 ENCOUNTER — Other Ambulatory Visit: Payer: Self-pay

## 2016-12-04 DIAGNOSIS — E785 Hyperlipidemia, unspecified: Secondary | ICD-10-CM | POA: Insufficient documentation

## 2016-12-04 DIAGNOSIS — Z7982 Long term (current) use of aspirin: Secondary | ICD-10-CM | POA: Insufficient documentation

## 2016-12-04 DIAGNOSIS — J449 Chronic obstructive pulmonary disease, unspecified: Secondary | ICD-10-CM | POA: Insufficient documentation

## 2016-12-04 DIAGNOSIS — I10 Essential (primary) hypertension: Secondary | ICD-10-CM | POA: Diagnosis not present

## 2016-12-04 DIAGNOSIS — K449 Diaphragmatic hernia without obstruction or gangrene: Secondary | ICD-10-CM | POA: Diagnosis not present

## 2016-12-04 DIAGNOSIS — K644 Residual hemorrhoidal skin tags: Secondary | ICD-10-CM | POA: Diagnosis not present

## 2016-12-04 DIAGNOSIS — K2950 Unspecified chronic gastritis without bleeding: Secondary | ICD-10-CM | POA: Insufficient documentation

## 2016-12-04 DIAGNOSIS — K648 Other hemorrhoids: Secondary | ICD-10-CM | POA: Insufficient documentation

## 2016-12-04 DIAGNOSIS — F17210 Nicotine dependence, cigarettes, uncomplicated: Secondary | ICD-10-CM | POA: Diagnosis not present

## 2016-12-04 DIAGNOSIS — B9681 Helicobacter pylori [H. pylori] as the cause of diseases classified elsewhere: Secondary | ICD-10-CM | POA: Insufficient documentation

## 2016-12-04 DIAGNOSIS — Z79899 Other long term (current) drug therapy: Secondary | ICD-10-CM | POA: Insufficient documentation

## 2016-12-04 DIAGNOSIS — D649 Anemia, unspecified: Secondary | ICD-10-CM | POA: Diagnosis not present

## 2016-12-04 DIAGNOSIS — K2970 Gastritis, unspecified, without bleeding: Secondary | ICD-10-CM

## 2016-12-04 DIAGNOSIS — I4891 Unspecified atrial fibrillation: Secondary | ICD-10-CM | POA: Insufficient documentation

## 2016-12-04 DIAGNOSIS — Q438 Other specified congenital malformations of intestine: Secondary | ICD-10-CM | POA: Diagnosis not present

## 2016-12-04 DIAGNOSIS — D122 Benign neoplasm of ascending colon: Secondary | ICD-10-CM

## 2016-12-04 DIAGNOSIS — D124 Benign neoplasm of descending colon: Secondary | ICD-10-CM | POA: Diagnosis not present

## 2016-12-04 DIAGNOSIS — D126 Benign neoplasm of colon, unspecified: Secondary | ICD-10-CM

## 2016-12-04 DIAGNOSIS — D123 Benign neoplasm of transverse colon: Secondary | ICD-10-CM

## 2016-12-04 HISTORY — PX: COLONOSCOPY: SHX5424

## 2016-12-04 HISTORY — PX: ESOPHAGOGASTRODUODENOSCOPY: SHX5428

## 2016-12-04 SURGERY — COLONOSCOPY
Anesthesia: Moderate Sedation

## 2016-12-04 MED ORDER — PANTOPRAZOLE SODIUM 40 MG PO TBEC: DELAYED_RELEASE_TABLET | ORAL | 11 refills | Status: DC

## 2016-12-04 MED ORDER — MEPERIDINE HCL 100 MG/ML IJ SOLN
INTRAMUSCULAR | Status: AC
  Filled 2016-12-04: qty 2

## 2016-12-04 MED ORDER — MIDAZOLAM HCL 5 MG/5ML IJ SOLN
INTRAMUSCULAR | Status: DC | PRN
  Administered 2016-12-04: 1 mg via INTRAVENOUS
  Administered 2016-12-04 (×3): 2 mg via INTRAVENOUS

## 2016-12-04 MED ORDER — MEPERIDINE HCL 100 MG/ML IJ SOLN
INTRAMUSCULAR | Status: DC | PRN
  Administered 2016-12-04: 50 mg
  Administered 2016-12-04: 25 mg
  Administered 2016-12-04: 50 mg

## 2016-12-04 MED ORDER — STERILE WATER FOR IRRIGATION IR SOLN
Status: DC | PRN
  Administered 2016-12-04: 12:00:00

## 2016-12-04 MED ORDER — MIDAZOLAM HCL 5 MG/5ML IJ SOLN
INTRAMUSCULAR | Status: AC
  Filled 2016-12-04: qty 10

## 2016-12-04 MED ORDER — LIDOCAINE VISCOUS 2 % MT SOLN
OROMUCOSAL | Status: AC
  Filled 2016-12-04: qty 15

## 2016-12-04 MED ORDER — SODIUM CHLORIDE 0.9 % IV SOLN
INTRAVENOUS | Status: DC
  Administered 2016-12-04: 1000 mL via INTRAVENOUS

## 2016-12-04 NOTE — Discharge Instructions (Signed)
You have MODERATE EXTERNAL AND internal hemorrhoids, and HAD 3 polyps removed. You have gastritis DUE TO ASA USE. I biopsied your stomach AND SMALL BOWEL.   DRINK WATER TO KEEP YOUR URINE LIGHT YELLOW.  FOLLOW A HIGH FIBER/LOW FAT DIET. AVOID ITEMS THAT CAUSE BLOATING. SEE INFO BELOW.  YOUR BIOPSY RESULTS WILL BE AVAILABLE IN MY CHART AFTER NOV 20 AND MY OFFICE WILL CONTACT YOU IN 10-14 DAYS WITH YOUR RESULTS.    START PROTONIX. TAKE 30 MINUTES PRIOR TO BREAKFAST DAILY.  REPEAT CBC IN 3 MOS.  FOLLOW UP IN 6 MOS.   Next colonoscopy in 3 years(2021).   ENDOSCOPY Care After Read the instructions outlined below and refer to this sheet in the next week. These discharge instructions provide you with general information on caring for yourself after you leave the hospital. While your treatment has been planned according to the most current medical practices available, unavoidable complications occasionally occur. If you have any problems or questions after discharge, call DR. Wilmina Maxham, [phone].  ACTIVITY  You may resume your regular activity, but move at a slower pace for the next 24 hours.   Take frequent rest periods for the next 24 hours.   Walking will help get rid of the air and reduce the bloated feeling in your belly (abdomen).   No driving for 24 hours (because of the medicine (anesthesia) used during the test).   You may shower.   Do not sign any important legal documents or operate any machinery for 24 hours (because of the anesthesia used during the test).    NUTRITION  Drink plenty of fluids.   You may resume your normal diet as instructed by your doctor.   Begin with a light meal and progress to your normal diet. Heavy or fried foods are harder to digest and may make you feel sick to your stomach (nauseated).   Avoid alcoholic beverages for 24 hours or as instructed.    MEDICATIONS  You may resume your normal medications.   WHAT YOU CAN EXPECT  TODAY  Some feelings of bloating in the abdomen.   Passage of more gas than usual.   Spotting of blood in your stool or on the toilet paper  .  IF YOU HAD POLYPS REMOVED DURING THE ENDOSCOPY:  Eat a soft diet IF YOU HAVE NAUSEA, BLOATING, ABDOMINAL PAIN, OR VOMITING.    FINDING OUT THE RESULTS OF YOUR TEST Not all test results are available during your visit. DR. Oneida Alar WILL CALL YOU WITHIN 14 DAYS OF YOUR PROCEDUE WITH YOUR RESULTS. Do not assume everything is normal if you have not heard from DR. Vaniya Augspurger, CALL HER OFFICE AT [phone].  SEEK IMMEDIATE MEDICAL ATTENTION AND CALL THE OFFICE: [phone] IF:  You have more than a spotting of blood in your stool.   Your belly is swollen (abdominal distention).   You are nauseated or vomiting.   You have a temperature over 101F.   You have abdominal pain or discomfort that is severe or gets worse throughout the day.  Polyps, Colon  A polyp is extra tissue that grows inside your body. Colon polyps grow in the large intestine. The large intestine, also called the colon, is part of your digestive system. It is a long, hollow tube at the end of your digestive tract where your body makes and stores stool. Most polyps are not dangerous. They are benign. This means they are not cancerous. But over time, some types of polyps can turn into cancer.  Polyps that are smaller than a pea are usually not harmful. But larger polyps could someday become or may already be cancerous. To be safe, doctors remove all polyps and test them.     PREVENTION There is not one sure way to prevent polyps. You might be able to lower your risk of getting them if you:  Eat more fruits and vegetables and less fatty food.   Do not smoke.   Avoid alcohol.   Exercise every day.   Lose weight if you are overweight.   Eating more calcium and folate can also lower your risk of getting polyps. Some foods that are rich in calcium are milk, cheese, and broccoli.  Some foods that are rich in folate are chickpeas, kidney beans, and spinach.    Gastritis  Gastritis is an inflammation (the body's way of reacting to injury and/or infection) of the stomach. It is often caused by viral or bacterial (germ) infections. It can also be caused BY ASPIRIN, BC/GOODY POWDER'S, (IBUPROFEN) MOTRIN, OR ALEVE (NAPROXEN), chemicals (including alcohol), SPICY FOODS, and medications. This illness may be associated with generalized malaise (feeling tired, not well), UPPER ABDOMINAL STOMACH cramps, and fever. One common bacterial cause of gastritis is an organism known as H. Pylori. This can be treated with antibiotics.    High-Fiber Diet A high-fiber diet changes your normal diet to include more whole grains, legumes, fruits, and vegetables. Changes in the diet involve replacing refined carbohydrates with unrefined foods. The calorie level of the diet is essentially unchanged. The Dietary Reference Intake (recommended amount) for adult males is 38 grams per day. For adult females, it is 25 grams per day. Pregnant and lactating women should consume 28 grams of fiber per day. Fiber is the intact part of a plant that is not broken down during digestion. Functional fiber is fiber that has been isolated from the plant to provide a beneficial effect in the body. PURPOSE  Increase stool bulk.   Ease and regulate bowel movements.   Lower cholesterol.   REDUCE RISK OF COLON CANCER  INDICATIONS THAT YOU NEED MORE FIBER  Constipation and hemorrhoids.   Uncomplicated diverticulosis (intestine condition) and irritable bowel syndrome.   Weight management.   As a protective measure against hardening of the arteries (atherosclerosis), diabetes, and cancer.   GUIDELINES FOR INCREASING FIBER IN THE DIET  Start adding fiber to the diet slowly. A gradual increase of about 5 more grams (2 slices of whole-wheat bread, 2 servings of most fruits or vegetables, or 1 bowl of high-fiber  cereal) per day is best. Too rapid an increase in fiber may result in constipation, flatulence, and bloating.   Drink enough water and fluids to keep your urine clear or pale yellow. Water, juice, or caffeine-free drinks are recommended. Not drinking enough fluid may cause constipation.   Eat a variety of high-fiber foods rather than one type of fiber.   Try to increase your intake of fiber through using high-fiber foods rather than fiber pills or supplements that contain small amounts of fiber.   The goal is to change the types of food eaten. Do not supplement your present diet with high-fiber foods, but replace foods in your present diet.   INCLUDE A VARIETY OF FIBER SOURCES  Replace refined and processed grains with whole grains, canned fruits with fresh fruits, and incorporate other fiber sources. White rice, white breads, and most bakery goods contain little or no fiber.   Brown whole-grain rice, buckwheat oats, and many  fruits and vegetables are all good sources of fiber. These include: broccoli, Brussels sprouts, cabbage, cauliflower, beets, sweet potatoes, white potatoes (skin on), carrots, tomatoes, eggplant, squash, berries, fresh fruits, and dried fruits.   Cereals appear to be the richest source of fiber. Cereal fiber is found in whole grains and bran. Bran is the fiber-rich outer coat of cereal grain, which is largely removed in refining. In whole-grain cereals, the bran remains. In breakfast cereals, the largest amount of fiber is found in those with "bran" in their names. The fiber content is sometimes indicated on the label.   You may need to include additional fruits and vegetables each day.   In baking, for 1 cup white flour, you may use the following substitutions:   1 cup whole-wheat flour minus 2 tablespoons.   1/2 cup white flour plus 1/2 cup whole-wheat flour.   Low-Fat Diet BREADS, CEREALS, PASTA, RICE, DRIED PEAS, AND BEANS These products are high in carbohydrates  and most are low in fat. Therefore, they can be increased in the diet as substitutes for fatty foods. They too, however, contain calories and should not be eaten in excess. Cereals can be eaten for snacks as well as for breakfast.  Include foods that contain fiber (fruits, vegetables, whole grains, and legumes). Research shows that fiber may lower blood cholesterol levels, especially the water-soluble fiber found in fruits, vegetables, oat products, and legumes. FRUITS AND VEGETABLES It is good to eat fruits and vegetables. Besides being sources of fiber, both are rich in vitamins and some minerals. They help you get the daily allowances of these nutrients. Fruits and vegetables can be used for snacks and desserts. MEATS Limit lean meat, chicken, [place], and fish to no more than 6 ounces per day. Beef, Pork, and Lamb Use lean cuts of beef, pork, and lamb. Lean cuts include:  Extra-lean ground beef.  Arm roast.  Sirloin tip.  Center-cut ham.  Round steak.  Loin chops.  Rump roast.  Tenderloin.  Trim all fat off the outside of meats before cooking. It is not necessary to severely decrease the intake of red meat, but lean choices should be made. Lean meat is rich in protein and contains a highly absorbable form of iron. Premenopausal women, in particular, should avoid reducing lean red meat because this could increase the risk for low red blood cells (iron-deficiency anemia). The organ meats, such as liver, sweetbreads, kidneys, and brain are very rich in cholesterol. They should be limited. Chicken and [place] These are good sources of protein. The fat of poultry can be reduced by removing the skin and underlying fat layers before cooking. Chicken and [place] can be substituted for lean red meat in the diet. Poultry should not be fried or covered with high-fat sauces. Fish and Shellfish Fish is a good source of protein. Shellfish contain cholesterol, but they usually are low in saturated fatty  acids. The preparation of fish is important. Like chicken and [place], they should not be fried or covered with high-fat sauces. EGGS Egg whites contain no fat or cholesterol. They can be eaten often. Try 1 to 2 egg whites instead of whole eggs in recipes or use egg substitutes that do not contain yolk. MILK AND DAIRY PRODUCTS Use skim or 1% milk instead of 2% or whole milk. Decrease whole milk, natural, and processed cheeses. Use nonfat or low-fat (2%) cottage cheese or low-fat cheeses made from vegetable oils. Choose nonfat or low-fat (1 to 2%) yogurt. Experiment with evaporated skim  milk in recipes that call for heavy cream. Substitute low-fat yogurt or low-fat cottage cheese for sour cream in dips and salad dressings. Have at least 2 servings of low-fat dairy products, such as 2 glasses of skim (or 1%) milk each day to help get your daily calcium intake.  FATS AND OILS Reduce the total intake of fats, especially saturated fat. Butterfat, lard, and beef fats are high in saturated fat and cholesterol. These should be avoided as much as possible. Vegetable fats do not contain cholesterol, but certain vegetable fats, such as coconut oil, palm oil, and palm kernel oil are very high in saturated fats. These should be limited. These fats are often used in bakery goods, processed foods, popcorn, oils, and nondairy creamers. Vegetable shortenings and some peanut butters contain hydrogenated oils, which are also saturated fats. Read the labels on these foods and check for saturated vegetable oils. Unsaturated vegetable oils and fats do not raise blood cholesterol. However, they should be limited because they are fats and are high in calories. Total fat should still be limited to 30% of your daily caloric intake. Desirable liquid vegetable oils are corn oil, cottonseed oil, olive oil, canola oil, safflower oil, soybean oil, and sunflower oil. Peanut oil is not as good, but small amounts are acceptable. Buy a  heart-healthy tub margarine that has no partially hydrogenated oils in the ingredients. Mayonnaise and salad dressings often are made from unsaturated fats, but they should also be limited because of their high calorie and fat content. Seeds, nuts, peanut butter, olives, and avocados are high in fat, but the fat is mainly the unsaturated type. These foods should be limited mainly to avoid excess calories and fat. OTHER EATING TIPS Snacks  Most sweets should be limited as snacks. They tend to be rich in calories and fats, and their caloric content outweighs their nutritional value. Some good choices in snacks are graham crackers, melba toast, soda crackers, bagels (no egg), English muffins, fruits, and vegetables. These snacks are preferable to snack crackers, [place] fries, and chips. Popcorn should be air-popped or cooked in small amounts of liquid vegetable oil. Desserts Eat fruit, low-fat yogurt, and fruit ices. AVOID pastries, cake, and cookies. Sherbet, angel food cake, gelatin dessert, frozen low-fat yogurt, or other frozen products that do not contain saturated fat (pure fruit juice bars, frozen ice pops) are also acceptable.  COOKING METHODS Choose those methods that use little or no fat. They include: Poaching.  Braising.  Steaming.  Grilling.  Baking.  Stir-frying.  Broiling.  Microwaving.  Foods can be cooked in a nonstick pan without added fat, or use a nonfat cooking spray in regular cookware. Limit fried foods and avoid frying in saturated fat. Add moisture to lean meats by using water, broth, cooking wines, and other nonfat or low-fat sauces along with the cooking methods mentioned above. Soups and stews should be chilled after cooking. The fat that forms on top after a few hours in the refrigerator should be skimmed off. When preparing meals, avoid using excess salt. Salt can contribute to raising blood pressure in some people. EATING AWAY FROM HOME Order entres, potatoes, and  vegetables without sauces or butter. When meat exceeds the size of a deck of cards (3 to 4 ounces), the rest can be taken home for another meal. Choose vegetable or fruit salads and ask for low-calorie salad dressings to be served on the side. Use dressings sparingly. Limit high-fat toppings, such as bacon, crumbled eggs, cheese, sunflower seeds,  and olives. Ask for heart-healthy tub margarine instead of butter.

## 2016-12-04 NOTE — Op Note (Signed)
[facility] Patient Name: Christopher Lang Procedure Date: 12/04/2016 11:30 AM MRN: [id number] Date of Birth: 12-19-1955 Attending MD: Barney Drain MD, MD CSN: [id number] Age: 61 Admit Type: Outpatient Procedure:                Colonoscopy WITH COLD SNARE POLYPECTOMY Indications:              Anemia, NORMOCYTIC(OCT 2018 Hb 12.9 MCV 91.9) Providers:                Barney Drain MD, MD, Charlsie Quest. Theda Sers RN, RN,                            Aram Candela Referring MD:             Lysle Morales Medicines:                Meperidine 125 mg IV, Midazolam 6 mg IV Complications:            No immediate complications. Estimated Blood Loss:     Estimated blood loss was minimal. Procedure:                Pre-Anesthesia Assessment:                           - Prior to the procedure, a History and Physical                            was performed, and patient medications and                            allergies were reviewed. The patient's tolerance of                            previous anesthesia was also reviewed. The risks                            and benefits of the procedure and the sedation                            options and risks were discussed with the patient.                            All questions were answered, and informed consent                            was obtained. Prior Anticoagulants: The patient has                            taken aspirin, last dose was 1 day prior to                            procedure. ASA Grade Assessment: II - A patient                            with mild systemic disease. After reviewing the  risks and benefits, the patient was deemed in                            satisfactory condition to undergo the procedure.                            After obtaining informed consent, the colonoscope                            was passed under direct vision. Throughout the                            procedure, the patient's blood  pressure, pulse, and                            oxygen saturations were monitored continuously. The                            EC-[id number] ([id number]) scope was introduced through                            the anus and advanced to the the cecum, identified                            by the ileocecal valve. The colonoscopy was                            somewhat difficult due to a tortuous colon.                            Successful completion of the procedure was aided by                            COLOWRAP. The patient tolerated the procedure well.                            The quality of the bowel preparation was excellent.                            The ileocecal valve, appendiceal orifice, and                            rectum were photographed. Scope In: 11:48:06 AM Scope Out: 12:06:30 PM Scope Withdrawal Time: 0 hours 14 minutes 49 seconds  Total Procedure Duration: 0 hours 18 minutes 24 seconds  Findings:      Three sessile polyps were found in the descending colon, transverse       colon and ascending colon. The polyps were 4 to 6 mm in size. These       polyps were removed with a cold snare. Resection and retrieval were       complete.      External and internal hemorrhoids were found during retroflexion. The       hemorrhoids were moderate.      The recto-sigmoid colon, sigmoid  colon and descending colon were       moderately redundant. Impression:               - Three 4 to 6 mm polyps in the descending colon,                            in the transverse colon and in the ascending colon,                            removed with a cold snare. Resected and retrieved.                           - External and internal hemorrhoids.                           - Redundant LEFT colon. Moderate Sedation:      Moderate (conscious) sedation was administered by the endoscopy nurse       and supervised by the endoscopist. The following parameters were       monitored: oxygen saturation,  heart rate, blood pressure, and response       to care. Total physician intraservice time was 44 minutes. Recommendation:           - High fiber diet.                           - Continue present medications.                           - Await pathology results.                           - Repeat colonoscopy in 3 years for surveillance.                           - Return to my office in 6 months.                           - Patient has a contact number available for                            emergencies. The signs and symptoms of potential                            delayed complications were discussed with the                            patient. Return to normal activities tomorrow.                            Written discharge instructions were provided to the                            patient. Procedure Code(s):        --- Professional ---  [phone], Colonoscopy, flexible; with removal of                            tumor(s), polyp(s), or other lesion(s) by snare                            technique                           99152, Moderate sedation services provided by the                            same physician or other qualified health care                            professional performing the diagnostic or                            therapeutic service that the sedation supports,                            requiring the presence of an independent trained                            observer to assist in the monitoring of the                            patient's level of consciousness and physiological                            status; initial 15 minutes of intraservice time,                            patient age 85 years or older                           [phone], Moderate sedation services; each additional                            15 minutes intraservice time                           99153, Moderate sedation services; each additional                             15 minutes intraservice time Diagnosis Code(s):        --- Professional ---                           D12.4, Benign neoplasm of descending colon                           D12.3, Benign neoplasm of transverse colon (hepatic  flexure or splenic flexure)                           D12.2, Benign neoplasm of ascending colon                           K64.8, Other hemorrhoids                           D64.9, Anemia, unspecified                           Q43.8, Other specified congenital malformations of                            intestine CPT copyright 2016 [employer]. All rights reserved. The codes documented in this report are preliminary and upon coder review may  be revised to meet current compliance requirements. Barney Drain, MD Barney Drain MD, MD 12/04/2016 12:30:36 PM This report has been signed electronically. Number of Addenda: 0

## 2016-12-04 NOTE — H&P (Signed)
Primary Care Physician:  [name], MD Primary Gastroenterologist:  Dr. Oneida Alar  Pre-Procedure History & Physical: HPI:  Christopher Lang is a 61 y.o. male here for Anemia.  Past Medical History:  Diagnosis Date  . Cataract    had removed Dec 2017/Jan 2018  . COPD (chronic obstructive pulmonary disease) ([employer])   . Essential hypertension   . History of atrial fibrillation    November 2017  . Hyperlipidemia   . Sleep apnea    COULDNT TOLERATE CPAP  . Tubulovillous adenoma of rectum 04/2010   4 ADDITIONAL SIMPLE ADENOMAS    Past Surgical History:  Procedure Laterality Date  . CATARACT EXTRACTION PHACO AND INTRAOCULAR LENS PLACEMENT LEFT EYE CDE 3.13 Left 01/30/2016   Performed by Tonny Branch, MD at [facility]  . CATARACT EXTRACTION PHACO AND INTRAOCULAR LENS PLACEMENT RIGHT EYE CDE= 6.97 Right 01/16/2016   Performed by Tonny Branch, MD at [facility]  . COLONOSCOPY  2012  . EXCISION, SKI  08/2016   R THIGH    Prior to Admission medications   Medication Sig Start Date End Date Taking? Authorizing Provider  aspirin EC 81 MG tablet Take 81 mg by mouth daily.   Yes [provider]  atorvastatin (LIPITOR) 10 MG tablet Take 1 tablet (10 mg total) by mouth daily at 6 PM. 10/06/16  Yes [name], MD  cholecalciferol (VITAMIN D) 1000 units tablet Take 1,000 Units by mouth daily.   Yes [provider]  diltiazem (CARDIZEM CD) 180 MG 24 hr capsule Take 1 capsule (180 mg total) by mouth daily. 10/06/16  Yes [name], MD  fish oil-omega-3 fatty acids 1000 MG capsule Take 1,000 mg by mouth daily.     Yes [provider]  losartan-hydrochlorothiazide (HYZAAR) 100-25 MG tablet Take 1 tablet by mouth daily. 10/06/16  Yes [name], MD  polyethylene glycol-electrolytes (TRILYTE) 420 g solution Take 4,000 mLs by mouth as directed. 11/06/16  Yes Mahala Menghini, PA-C    Allergies as of 10/28/2016  . (No Known Allergies)    Family History   Problem Relation Age of Onset  . CAD Mother   . High blood pressure Mother   . Heart attack Mother   . Leukemia Father   . Early death Father   . Heart attack Sister   . Diabetes Sister   . Cancer Brother   . Deep vein thrombosis Brother   . Hypertension Daughter   . Stroke Sister   . Stroke Sister   . Heart attack Sister   . Deep vein thrombosis Sister   . Hypertension Sister   . Diabetes Sister   . Arthritis Sister   . Arthritis Sister   . Arthritis Sister   . Hypertension Sister   . Heart attack Sister   . Cancer Brother        KIDNEY  . Kidney disease Brother   . Deep vein thrombosis Brother   . Seizures Brother   . Heart attack Brother   . Colon cancer Neg Hx   . Colon polyps Neg Hx     Social History   Socioeconomic History  . Marital status: Married    Spouse name: [name]  . Number of children: 2  . Years of education: GED  . Highest education level: Not on file  Social Needs  . Financial resource strain: Not on file  . Food insecurity - worry: Not on file  . Food insecurity - inability: Not  on file  . Transportation needs - medical: Not on file  . Transportation needs - non-medical: Not on file  Occupational History  . 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 Smoking status: Current Every Day Smoker    Packs/day: 1.00    Years: 45.00    Pack years: 45.00    Types: Cigarettes  . Smokeless tobacco: Never Used  Substance and Sexual Activity  . Alcohol use: No    Alcohol/week: 0.0 oz    Comment: Quit in November  . Drug use: No  . Sexual activity: Yes    Birth control/protection: Post-menopausal  Other Topics Concern  . Not on file  Social History Narrative   Lives with wife [name]   Leisure - couch potato    Review of Systems: See HPI, otherwise negative ROS   Physical Exam: BP (!) 149/80   Pulse 89   Temp 98.9 F (37.2 C) (Oral)   Resp 17   Ht 6' (1.829 m)   Wt 208 lb (94.3 kg)   SpO2 95%   BMI  28.21 kg/m  General:   Alert,  pleasant and cooperative in NAD Head:  Normocephalic and atraumatic. Neck:  Supple; Lungs:  Clear throughout to auscultation.    Heart:  Regular rate and rhythm. Abdomen:  Soft, nontender and nondistended. Normal bowel sounds, without guarding, and without rebound.   Neurologic:  Alert and  oriented x4;  grossly normal neurologically.  Impression/Plan:    Anemia  PLAN:  1. TCS/EGD TODAY DISCUSSED PROCEDURE, BENEFITS, & RISKS: < 1% chance of medication reaction, bleeding, OR perforation.

## 2016-12-04 NOTE — Op Note (Addendum)
[facility] Patient Name: Christopher Lang Procedure Date: 12/04/2016 12:08 PM MRN: [id number] Date of Birth: [birth year] Attending MD: Barney Drain MD, MD CSN: [id number] Age: 61 Admit Type: Outpatient Procedure:                Upper GI endoscopy WITH COLD FORCEPS BIOPSY Indications:              Anemia Providers:                Barney Drain MD, MD, Selena Lesser RN, RN, Aram Candela Referring MD:             Lysle Morales Medicines:                TCS + Midazolam 1 mg IV Complications:            No immediate complications. Estimated Blood Loss:     Estimated blood loss was minimal. Procedure:                Pre-Anesthesia Assessment:                           - Prior to the procedure, a History and Physical                            was performed, and patient medications and                            allergies were reviewed. The patient's tolerance of                            previous anesthesia was also reviewed. The risks                            and benefits of the procedure and the sedation                            options and risks were discussed with the patient.                            All questions were answered, and informed consent                            was obtained. Prior Anticoagulants: The patient has                            taken aspirin, last dose was 1 day prior to                            procedure. ASA Grade Assessment: II - A patient                            with mild systemic disease. After reviewing the  risks and benefits, the patient was deemed in                            satisfactory condition to undergo the procedure.                            After obtaining informed consent, the endoscope was                            passed under direct vision. Throughout the                            procedure, the patient's blood pressure, pulse, and                            oxygen  saturations were monitored continuously. The                            [id number] ([id number]) scope was introduced through the                            mouth, and advanced to the second part of duodenum.                            The upper GI endoscopy was accomplished without                            difficulty. The patient tolerated the procedure                            well. Scope In: 12:10:44 PM Scope Out: 12:19:40 PM Total Procedure Duration: 0 hours 8 minutes 56 seconds  Findings:      The examined esophagus was normal.      A small hiatal hernia was present.      Localized mild inflammation characterized by congestion (edema) and       erythema was found on the greater curvature of the stomach and in the       gastric antrum. Biopsies were taken with a cold forceps for Helicobacter       pylori testing.      The examined duodenum was normal. Biopsies for histology were taken with       a cold forceps for evaluation of celiac disease. Impression:               - NORMOCYTIC ANEMIA DUE TO GASTRITIS/COLON POLYPS.                           - Small hiatal hernia. Moderate Sedation:      Moderate (conscious) sedation was administered by the endoscopy nurse       and supervised by the endoscopist. The following parameters were       monitored: oxygen saturation, heart rate, blood pressure, and response       to care. Total physician intraservice time was 15 minutes. Recommendation:           - Await pathology results.                           -  Continue present medications.                           - Use Protonix (pantoprazole) 40 mg PO daily.                           - Resume previous diet.                           - Return to my office in 6 months.                           - Patient has a contact number available for                            emergencies. The signs and symptoms of potential                            delayed complications were discussed with the                             patient. Return to normal activities tomorrow.                            Written discharge instructions were provided to the                            patient. Procedure Code(s):        --- Professional ---                           [phone], Esophagogastroduodenoscopy, flexible,                            transoral; with biopsy, single or multiple                           99152, Moderate sedation services provided by the                            same physician or other qualified health care                            professional performing the diagnostic or                            therapeutic service that the sedation supports,                            requiring the presence of an independent trained                            observer to assist in the monitoring of the  patient's level of consciousness and physiological                            status; initial 15 minutes of intraservice time,                            patient age 67 years or older Diagnosis Code(s):        --- Professional ---                           K44.9, Diaphragmatic hernia without obstruction or                            gangrene                           K29.70, Gastritis, unspecified, without bleeding                           D64.9, Anemia, unspecified CPT copyright 2016 American Medical Association. All rights reserved. The codes documented in this report are preliminary and upon coder review may  be revised to meet current compliance requirements. Barney Drain, MD Barney Drain MD, MD 12/04/2016 12:34:24 PM This report has been signed electronically. Number of Addenda: 0

## 2016-12-07 ENCOUNTER — Encounter (HOSPITAL_COMMUNITY): Payer: Self-pay | Admitting: Gastroenterology

## 2016-12-08 ENCOUNTER — Encounter: Payer: Self-pay | Admitting: Family Medicine

## 2016-12-08 ENCOUNTER — Telehealth: Payer: Self-pay | Admitting: Gastroenterology

## 2016-12-08 DIAGNOSIS — K2970 Gastritis, unspecified, without bleeding: Secondary | ICD-10-CM

## 2016-12-08 DIAGNOSIS — B9681 Helicobacter pylori [H. pylori] as the cause of diseases classified elsewhere: Secondary | ICD-10-CM | POA: Insufficient documentation

## 2016-12-08 MED ORDER — CLARITHROMYCIN 500 MG PO TABS: ORAL_TABLET | ORAL | 0 refills | Status: DC

## 2016-12-08 MED ORDER — AMOXICILLIN 500 MG PO TABS
ORAL_TABLET | ORAL | 0 refills | Status: DC
Start: 2016-12-08 — End: 2017-02-01

## 2016-12-08 MED ORDER — DILTIAZEM HCL ER 90 MG PO CP12: 90.0000 mg | ORAL_CAPSULE | Freq: Every day | ORAL | 0 refills | Status: DC

## 2016-12-08 NOTE — Telephone Encounter (Signed)
ON RECALL  °

## 2016-12-08 NOTE — Telephone Encounter (Addendum)
PLEASE CALL PT. HIS stomach Bx showed H. Pylori infection, WHICH CAN CAUSE MILD ANEMIA. IF IT GOES UNTREATED IT CAN LEAD TO STOMACH CANCER. HE needs AMOXICILLIN 500 mg 2 po BID for 10 days and Biaxin 500 mg po bid for 10 days. INCREASE PROTONIX TO BID for 30 days then 1 po 30 MINS PRIOR TO BREAKFAST FOREVER DUE TO DAILY ASA USE. DO NOT TAKE LIPITOR WHILE TAKING THE ANTIBIOTICS.  The meds CAN cause nausea, vomiting, abd cramps, loose stools, black colored stools, and A metallic taste in her mouth  YOUR SISTERS, BROTHERS, CHILDREN, AND PARENTS NEED TO HAVE THE H PYLORI STOOL OR BREATH TEST.   CUT DILTIAZEM DOSE IN HALF WHILE TAKING ABX I SENT RX FOR DZM 90 MG DAILY FOR 10 DAYS.   HE HAD 3 SIMPLE ADENOMAS REMOVED FROM HIS COLON.  REPEAT EGD IN 3 MOS TO REASSESS GASTRIC MUCOSA BECAUSE THERE WERE SOME ATYPICAL CELLS IN THE LINING IF YOUR STOMACH. THIS COULD JUST BE DUE TO THE H PYLORI INFECTION.  REPEAT CBC IN 3 MOS.  FOLLOW UP IN 6 MOS E30 H PYLORI GASTRITIS/ANEMIA.   Next colonoscopy in 3 years(2021).

## 2016-12-08 NOTE — Telephone Encounter (Signed)
Dr. Oneida Alar, T/C from [place] at [facility], They no longer have the Cardizem SR, but they do make the Cardizem CD for once a day. Please advise!

## 2016-12-09 ENCOUNTER — Other Ambulatory Visit: Payer: Self-pay

## 2016-12-09 DIAGNOSIS — D649 Anemia, unspecified: Secondary | ICD-10-CM

## 2016-12-09 NOTE — Telephone Encounter (Signed)
I spoke to [place], he said the Cardizem CD does not come in 90 mg. He suggested using just 30 mg tablets one tablet three times a day ( one every 8 hrs) for the 10 days. Per Dr. Oneida Alar that is OK and Lanny Hurst is aware.

## 2016-12-09 NOTE — Telephone Encounter (Signed)
PT is aware of results and plan. He knows to stop the Cardizem 180 mg while on the antibiotics and taking the 30 mg tid. He will recheck labs in 3 months and repeat EGD in 3 months.

## 2016-12-09 NOTE — Telephone Encounter (Signed)
PLEASE CALL PT'S pharmacy. Ok to substitute CD

## 2016-12-09 NOTE — Telephone Encounter (Signed)
REVIEWED-NO ADDITIONAL RECOMMENDATIONS. 

## 2016-12-09 NOTE — Telephone Encounter (Signed)
FYI to Dr. Oneida Alar about the medication.

## 2016-12-09 NOTE — Telephone Encounter (Signed)
Pt is on my list to call in 3 months to triage for the EGD.

## 2017-01-20 NOTE — Telephone Encounter (Signed)
Letter reminder mailed to pt to call to schedule his EGD.

## 2017-02-01 ENCOUNTER — Telehealth: Payer: Self-pay

## 2017-02-01 NOTE — Telephone Encounter (Signed)
See separate triage.  

## 2017-02-01 NOTE — Telephone Encounter (Signed)
Christopher Lang has mailed pt letter to triage for EGD. Fowarding to her

## 2017-02-01 NOTE — Telephone Encounter (Addendum)
Gastroenterology Pre-Procedure Review  Request Date: 02/01/2017 Requesting Physician: Dr. Oneida Alar  PATIENT REVIEW QUESTIONS: The patient responded to the following health history questions as indicated:    Pt to have EGD to reassess atypical cells in the stomach lining  1. Diabetes Melitis: no 2. Joint replacements in the past 12 months: no 3. Major health problems in the past 3 months: no 4. Has an artificial valve or MVP: no 5. Has a defibrillator: no 6. Has been advised in past to take antibiotics in advance of a procedure like teeth cleaning: no 7. Family history of colon cancer: no  8. Alcohol Use: no 9. History of sleep apnea: no  10. History of coronary artery or other vascular stents placed within the last 12 months: no 11. History of any prior anesthesia complications: no    MEDICATIONS & ALLERGIES:    Patient reports the following regarding taking any blood thinners:   Plavix? no Aspirin? YES Coumadin? no Brilinta? no Xarelto? no Eliquis? no Pradaxa? no Savaysa? no Effient? no  Patient confirms/reports the following medications:  Current Outpatient Medications  Medication Sig Dispense Refill  . aspirin EC 81 MG tablet Take 81 mg by mouth daily.    Marland Kitchen atorvastatin (LIPITOR) 10 MG tablet Take 1 tablet (10 mg total) by mouth daily at 6 PM. 90 tablet 3  . cholecalciferol (VITAMIN D) 1000 units tablet Take 1,000 Units by mouth daily.    Marland Kitchen diltiazem (CARDIZEM CD) 180 MG 24 hr capsule Take 1 capsule (180 mg total) by mouth daily. 90 capsule 3  . fish oil-omega-3 fatty acids 1000 MG capsule Take 1,000 mg by mouth daily.      Marland Kitchen losartan-hydrochlorothiazide (HYZAAR) 100-25 MG tablet Take 1 tablet by mouth daily. 90 tablet 3  . pantoprazole (PROTONIX) 40 MG tablet 1 po 30 mins prior to first meal 30 tablet 11   No current facility-administered medications for this visit.     Patient confirms/reports the following allergies:  No Known Allergies  No orders of the defined  types were placed in this encounter.   AUTHORIZATION INFORMATION Primary Insurance:   ID #:   Group #:  Pre-Cert / Auth required: Pre-Cert / Auth #:   Secondary Insurance:   ID #:  Group #:  Pre-Cert / Auth required: Pre-Cert / Auth #:   SCHEDULE INFORMATION: Procedure has been scheduled as follows:  Date: 03/15/2017                Time:  9:30 AM Location:   This Gastroenterology Pre-Precedure Review Form is being routed to the following provider(s): Barney Drain, MD

## 2017-02-01 NOTE — Telephone Encounter (Signed)
Pt called to schedule his EGD. Pt received letter in the mail asking him to call and schedule.

## 2017-02-02 NOTE — Telephone Encounter (Signed)
MOVI PREP SPLIT DOSING,  CLEAR LIQUIDS WITH BREAKFAST.  

## 2017-02-03 ENCOUNTER — Other Ambulatory Visit: Payer: Self-pay

## 2017-02-03 DIAGNOSIS — R933 Abnormal findings on diagnostic imaging of other parts of digestive tract: Secondary | ICD-10-CM

## 2017-02-03 NOTE — Telephone Encounter (Signed)
Instructions for the EGD mailed to pt.

## 2017-02-08 ENCOUNTER — Other Ambulatory Visit: Payer: Self-pay

## 2017-02-08 DIAGNOSIS — D649 Anemia, unspecified: Secondary | ICD-10-CM

## 2017-03-11 DIAGNOSIS — D649 Anemia, unspecified: Secondary | ICD-10-CM | POA: Diagnosis not present

## 2017-03-11 LAB — CBC WITH DIFFERENTIAL/PLATELET
Basophils Absolute: 44 cells/uL (ref 0–200)
Basophils Relative: 0.4 %
Eosinophils Absolute: 144 cells/uL (ref 15–500)
Eosinophils Relative: 1.3 %
HCT: 37.1 % — ABNORMAL LOW (ref 38.5–50.0)
Hemoglobin: 13.1 g/dL — ABNORMAL LOW (ref 13.2–17.1)
Lymphs Abs: 4296 cells/uL — ABNORMAL HIGH (ref 850–3900)
MCH: 31.7 pg (ref 27.0–33.0)
MCHC: 35.3 g/dL (ref 32.0–36.0)
MCV: 89.8 fL (ref 80.0–100.0)
MPV: 11.4 fL (ref 7.5–12.5)
Monocytes Relative: 7.4 %
Neutro Abs: 5794 cells/uL (ref 1500–7800)
Neutrophils Relative %: 52.2 %
Platelets: 239 10*3/uL (ref 140–400)
RBC: 4.13 10*6/uL — ABNORMAL LOW (ref 4.20–5.80)
RDW: 11.7 % (ref 11.0–15.0)
Total Lymphocyte: 38.7 %
WBC mixed population: 821 cells/uL (ref 200–950)
WBC: 11.1 10*3/uL — ABNORMAL HIGH (ref 3.8–10.8)

## 2017-03-15 ENCOUNTER — Encounter (HOSPITAL_COMMUNITY): Payer: Self-pay | Admitting: *Deleted

## 2017-03-15 ENCOUNTER — Encounter (HOSPITAL_COMMUNITY)
Admission: RE | Disposition: A | Discharge status: Home / Self Care | Payer: Self-pay | Source: Ambulatory Visit | Attending: Gastroenterology

## 2017-03-15 ENCOUNTER — Other Ambulatory Visit: Payer: Self-pay

## 2017-03-15 ENCOUNTER — Ambulatory Visit (HOSPITAL_COMMUNITY)
Admission: RE | Admit: 2017-03-15 | Discharge: 2017-03-15 | Disposition: A | Discharge status: Home / Self Care | Payer: Medicare HMO | Source: Ambulatory Visit | Attending: Gastroenterology | Admitting: Gastroenterology

## 2017-03-15 DIAGNOSIS — R933 Abnormal findings on diagnostic imaging of other parts of digestive tract: Secondary | ICD-10-CM

## 2017-03-15 DIAGNOSIS — G4730 Sleep apnea, unspecified: Secondary | ICD-10-CM | POA: Diagnosis not present

## 2017-03-15 DIAGNOSIS — K2970 Gastritis, unspecified, without bleeding: Secondary | ICD-10-CM | POA: Diagnosis not present

## 2017-03-15 DIAGNOSIS — Z7982 Long term (current) use of aspirin: Secondary | ICD-10-CM | POA: Insufficient documentation

## 2017-03-15 DIAGNOSIS — Z87891 Personal history of nicotine dependence: Secondary | ICD-10-CM | POA: Insufficient documentation

## 2017-03-15 DIAGNOSIS — Z8371 Family history of colonic polyps: Secondary | ICD-10-CM | POA: Diagnosis not present

## 2017-03-15 DIAGNOSIS — I4891 Unspecified atrial fibrillation: Secondary | ICD-10-CM | POA: Diagnosis not present

## 2017-03-15 DIAGNOSIS — Z79899 Other long term (current) drug therapy: Secondary | ICD-10-CM | POA: Diagnosis not present

## 2017-03-15 DIAGNOSIS — K2950 Unspecified chronic gastritis without bleeding: Secondary | ICD-10-CM | POA: Insufficient documentation

## 2017-03-15 DIAGNOSIS — I10 Essential (primary) hypertension: Secondary | ICD-10-CM | POA: Diagnosis not present

## 2017-03-15 DIAGNOSIS — J449 Chronic obstructive pulmonary disease, unspecified: Secondary | ICD-10-CM | POA: Diagnosis not present

## 2017-03-15 DIAGNOSIS — E785 Hyperlipidemia, unspecified: Secondary | ICD-10-CM | POA: Insufficient documentation

## 2017-03-15 HISTORY — PX: ESOPHAGOGASTRODUODENOSCOPY: SHX5428

## 2017-03-15 SURGERY — EGD (ESOPHAGOGASTRODUODENOSCOPY)
Anesthesia: Moderate Sedation

## 2017-03-15 MED ORDER — ONDANSETRON HCL 4 MG/2ML IJ SOLN
INTRAMUSCULAR | Status: AC
  Filled 2017-03-15: qty 2

## 2017-03-15 MED ORDER — MEPERIDINE HCL 100 MG/ML IJ SOLN
INTRAMUSCULAR | Status: DC | PRN
  Administered 2017-03-15: 25 mg via INTRAVENOUS
  Administered 2017-03-15: 50 mg via INTRAVENOUS

## 2017-03-15 MED ORDER — LIDOCAINE-HYDROCORTISONE ACE 3-2.5 % RE KIT: PACK | RECTAL | 0 refills | Status: DC

## 2017-03-15 MED ORDER — SODIUM CHLORIDE 0.9 % IV SOLN
INTRAVENOUS | Status: DC
  Administered 2017-03-15: 09:00:00 via INTRAVENOUS

## 2017-03-15 MED ORDER — MEPERIDINE HCL 100 MG/ML IJ SOLN
INTRAMUSCULAR | Status: AC
  Filled 2017-03-15: qty 2

## 2017-03-15 MED ORDER — MIDAZOLAM HCL 5 MG/5ML IJ SOLN
INTRAMUSCULAR | Status: AC
  Filled 2017-03-15: qty 10

## 2017-03-15 MED ORDER — MIDAZOLAM HCL 5 MG/5ML IJ SOLN
INTRAMUSCULAR | Status: DC | PRN
  Administered 2017-03-15 (×2): 1 mg via INTRAVENOUS
  Administered 2017-03-15: 2 mg via INTRAVENOUS

## 2017-03-15 MED ORDER — ONDANSETRON HCL 4 MG/2ML IJ SOLN
INTRAMUSCULAR | Status: DC | PRN
  Administered 2017-03-15: 4 mg via INTRAVENOUS

## 2017-03-15 MED ORDER — STERILE WATER FOR IRRIGATION IR SOLN
Status: DC | PRN
  Administered 2017-03-15: 100 mL

## 2017-03-15 MED ORDER — LIDOCAINE VISCOUS 2 % MT SOLN
OROMUCOSAL | Status: AC
  Filled 2017-03-15: qty 15

## 2017-03-15 MED ORDER — LIDOCAINE VISCOUS 2 % MT SOLN
OROMUCOSAL | Status: DC | PRN
  Administered 2017-03-15: 4 mL via OROMUCOSAL

## 2017-03-15 NOTE — Op Note (Signed)
[facility] Patient Name: Christopher Lang Procedure Date: 03/15/2017 9:13 AM MRN: [id number] Date of Birth: Dec 18, 1955 Attending MD: Barney Drain MD, MD CSN: [id number] Age: 62 Admit Type: Outpatient Procedure:                Upper GI endoscopy WITH COLD FORCEPS BIOPSY Indications:              PT HAD ATYPICAL CELLS ON GASTRIC BIOPSY IN THE                            SETTING OF H PYLORI GASTRITIS IN NOV 2018.                            COMPLETED ABX. CBC FEB 2019 Hb 13.1 Providers:                Barney Drain MD, MD, Rosina Lowenstein, RN, Aram Candela Referring MD:             Lysle Morales Medicines:                Ondansetron 4 mg IV, Meperidine 75 mg IV, Midazolam                            4 mg IV Complications:            No immediate complications. Estimated Blood Loss:     Estimated blood loss was minimal. Procedure:                Pre-Anesthesia Assessment:                           - Prior to the procedure, a History and Physical                            was performed, and patient medications and                            allergies were reviewed. The patient's tolerance of                            previous anesthesia was also reviewed. The risks                            and benefits of the procedure and the sedation                            options and risks were discussed with the patient.                            All questions were answered, and informed consent                            was obtained. Prior Anticoagulants: The patient has                            taken aspirin, last dose was 1 day prior to  procedure. ASA Grade Assessment: II - A patient                            with mild systemic disease. After reviewing the                            risks and benefits, the patient was deemed in                            satisfactory condition to undergo the procedure.                            After obtaining informed consent, the  endoscope was                            passed under direct vision. Throughout the                            procedure, the patient's blood pressure, pulse, and                            oxygen saturations were monitored continuously. The                            [id number] ([id number]) scope was introduced through the                            mouth, and advanced to the second part of duodenum.                            The upper GI endoscopy was accomplished without                            difficulty. The patient tolerated the procedure                            well. Scope In: 9:34:51 AM Scope Out: 9:44:20 AM Total Procedure Duration: 0 hours 9 minutes 29 seconds  Findings:      The examined esophagus was normal.      Segmental mild inflammation characterized by congestion (edema) and       erythema was found in the gastric fundus and in the gastric antrum.       Biopsies were taken with a cold forceps for histology(antrum: btl 1,       BODY: BTL2, FUNDUS: BTL 3, CARDIA: BTL 4).      The examined duodenum was normal. Impression:               - MILD Gastritis. Biopsied. Moderate Sedation:      Moderate (conscious) sedation was administered by the endoscopy nurse       and supervised by the endoscopist. The following parameters were       monitored: oxygen saturation, heart rate, blood pressure, and response       to care. Total physician intraservice time was 17 minutes. Recommendation:           -  High fiber diet and low fat diet.                           - Continue present medications.                           - Await pathology results.                           - Return to my office in 6 months.                           - Patient has a contact number available for                            emergencies. The signs and symptoms of potential                            delayed complications were discussed with the                            patient. Return to normal  activities tomorrow.                            Written discharge instructions were provided to the                            patient. Procedure Code(s):        --- Professional ---                           [phone], Esophagogastroduodenoscopy, flexible,                            transoral; with biopsy, single or multiple                           99152, Moderate sedation services provided by the                            same physician or other qualified health care                            professional performing the diagnostic or                            therapeutic service that the sedation supports,                            requiring the presence of an independent trained                            observer to assist in the monitoring of the  patient's level of consciousness and physiological                            status; initial 15 minutes of intraservice time,                            patient age 44 years or older Diagnosis Code(s):        --- Professional ---                           K29.70, Gastritis, unspecified, without bleeding CPT copyright 2016 American Medical Association. All rights reserved. The codes documented in this report are preliminary and upon coder review may  be revised to meet current compliance requirements. Barney Drain, MD Barney Drain MD, MD 03/15/2017 9:53:06 AM This report has been signed electronically. Number of Addenda: 0

## 2017-03-15 NOTE — Discharge Instructions (Addendum)
You have mild gastritis. I biopsied your stomach.    USE WITCH HAZEL WIPES AFTER A BM. DO NOT [place] YOU BUTTOCK WITH LEVER 2000 MOR ETHAN ONCE A DAY.  USE PREPARATION H OR [facility] CREAM FOUR TIMES  A DAY FOR 10 DAYS AND THEN IF NEEDED TO RELIEVE RECTAL PAIN/PRESSURE/BLEEDING.   FOLLOW A LOW FAT DIET. MEATS SHOULD BE BAKED, BROILED, OR BOILED. Avoid fried foods. SEE INFO BELOW.  YOUR BIOPSY RESULTS WILL BE AVAILABLE IN MY CHART AFTER FEB 28 AND MY OFFICE WILL CONTACT YOU IN 10-14 DAYS WITH YOUR RESULTS.   FOLLOW UP IN 6 MOS.  UPPER ENDOSCOPY AFTER CARE Read the instructions outlined below and refer to this sheet in the next week. These discharge instructions provide you with general information on caring for yourself after you leave the hospital. While your treatment has been planned according to the most current medical practices available, unavoidable complications occasionally occur. If you have any problems or questions after discharge, call DR. Larinda Herter, [phone].  ACTIVITY  You may resume your regular activity, but move at a slower pace for the next 24 hours.   Take frequent rest periods for the next 24 hours.   Walking will help get rid of the air and reduce the bloated feeling in your belly (abdomen).   No driving for 24 hours (because of the medicine (anesthesia) used during the test).   You may shower.   Do not sign any important legal documents or operate any machinery for 24 hours (because of the anesthesia used during the test).    NUTRITION  Drink plenty of fluids.   You may resume your normal diet as instructed by your doctor.   Begin with a light meal and progress to your normal diet. Heavy or fried foods are harder to digest and may make you feel sick to your stomach (nauseated).   Avoid alcoholic beverages for 24 hours or as instructed.    MEDICATIONS  You may resume your normal medications.   WHAT YOU CAN EXPECT TODAY  Some feelings  of bloating in the abdomen.   Passage of more gas than usual.    IF YOU HAD A BIOPSY TAKEN DURING THE UPPER ENDOSCOPY:  Eat a soft diet IF YOU HAVE NAUSEA, BLOATING, ABDOMINAL PAIN, OR VOMITING.    FINDING OUT THE RESULTS OF YOUR TEST Not all test results are available during your visit. DR. Oneida Alar WILL CALL YOU WITHIN 14 DAYS OF YOUR PROCEDUE WITH YOUR RESULTS. Do not assume everything is normal if you have not heard from DR. Janes Colegrove, CALL HER OFFICE AT [phone].  SEEK IMMEDIATE MEDICAL ATTENTION AND CALL THE OFFICE: [phone] IF:  You have more than a spotting of blood in your stool.   Your belly is swollen (abdominal distention).   You are nauseated or vomiting.   You have a temperature over 101F.   You have abdominal pain or discomfort that is severe or gets worse throughout the day.   Gastritis  Gastritis is an inflammation (the body's way of reacting to injury and/or infection) of the stomach. It is often caused by viral or bacterial (germ) infections. It can also be caused BY ASPIRIN, BC/GOODY POWDER'S, (IBUPROFEN) MOTRIN, OR ALEVE (NAPROXEN), chemicals (including alcohol), SPICY FOODS, and medications. This illness may be associated with generalized malaise (feeling tired, not well), UPPER ABDOMINAL STOMACH cramps, and fever. One common bacterial cause of gastritis is an organism known as H. Pylori. This can be treated with antibiotics.  Low-Fat Diet  BREADS, CEREALS, PASTA, RICE, DRIED PEAS, AND BEANS These products are high in carbohydrates and most are low in fat. Therefore, they can be increased in the diet as substitutes for fatty foods. They too, however, contain calories and should not be eaten in excess. Cereals can be eaten for snacks as well as for breakfast.  Include foods that contain fiber (fruits, vegetables, whole grains, and legumes). Research shows that fiber may lower blood cholesterol levels, especially the water-soluble fiber found in fruits,  vegetables, oat products, and legumes.  FRUITS AND VEGETABLES It is good to eat fruits and vegetables. Besides being sources of fiber, both are rich in vitamins and some minerals. They help you get the daily allowances of these nutrients. Fruits and vegetables can be used for snacks and desserts.  MEATS Limit lean meat, chicken, [place], and fish to no more than 6 ounces per day.  Beef, Pork, and Lamb Use lean cuts of beef, pork, and lamb. Lean cuts include:  Extra-lean ground beef.  Arm roast.  Sirloin tip.  Center-cut ham.  Round steak.  Loin chops.  Rump roast.  Tenderloin.  Trim all fat off the outside of meats before cooking. It is not necessary to severely decrease the intake of red meat, but lean choices should be made. Lean meat is rich in protein and contains a highly absorbable form of iron. Premenopausal women, in particular, should avoid reducing lean red meat because this could increase the risk for low red blood cells (iron-deficiency anemia).  Chicken and [place] These are good sources of protein. The fat of poultry can be reduced by removing the skin and underlying fat layers before cooking. Chicken and [place] can be substituted for lean red meat in the diet. Poultry should not be fried or covered with high-fat sauces.  Fish and Shellfish Fish is a good source of protein. Shellfish contain cholesterol, but they usually are low in saturated fatty acids. The preparation of fish is important. Like chicken and [place], they should not be fried or covered with high-fat sauces.  EGGS Egg whites contain no fat or cholesterol. They can be eaten often. Try 1 to 2 egg whites instead of whole eggs in recipes or use egg substitutes that do not contain yolk.  MILK AND DAIRY PRODUCTS Use skim or 1% milk instead of 2% or whole milk. Decrease whole milk, natural, and processed cheeses. Use nonfat or low-fat (2%) cottage cheese or low-fat cheeses made from vegetable oils. Choose nonfat or  low-fat (1 to 2%) yogurt. Experiment with evaporated skim milk in recipes that call for heavy cream. Substitute low-fat yogurt or low-fat cottage cheese for sour cream in dips and salad dressings. Have at least 2 servings of low-fat dairy products, such as 2 glasses of skim (or 1%) milk each day to help get your daily calcium intake.  FATS AND OILS Reduce the total intake of fats, especially saturated fat. Butterfat, lard, and beef fats are high in saturated fat and cholesterol. These should be avoided as much as possible. Vegetable fats do not contain cholesterol, but certain vegetable fats, such as coconut oil, palm oil, and palm kernel oil are very high in saturated fats. These should be limited. These fats are often used in bakery goods, processed foods, popcorn, oils, and nondairy creamers. Vegetable shortenings and some peanut butters contain hydrogenated oils, which are also saturated fats. Read the labels on these foods and check for saturated vegetable oils.  Unsaturated vegetable oils and fats do  not raise blood cholesterol. However, they should be limited because they are fats and are high in calories. Total fat should still be limited to 30% of your daily caloric intake. Desirable liquid vegetable oils are corn oil, cottonseed oil, olive oil, canola oil, safflower oil, soybean oil, and sunflower oil. Peanut oil is not as good, but small amounts are acceptable. Buy a heart-healthy tub margarine that has no partially hydrogenated oils in the ingredients. Mayonnaise and salad dressings often are made from unsaturated fats, but they should also be limited because of their high calorie and fat content. Seeds, nuts, peanut butter, olives, and avocados are high in fat, but the fat is mainly the unsaturated type. These foods should be limited mainly to avoid excess calories and fat.  OTHER EATING TIPS Snacks  Most sweets should be limited as snacks. They tend to be rich in calories and fats, and their  caloric content outweighs their nutritional value. Some good choices in snacks are graham crackers, melba toast, soda crackers, bagels (no egg), English muffins, fruits, and vegetables. These snacks are preferable to snack crackers, [place] fries, and chips. Popcorn should be air-popped or cooked in small amounts of liquid vegetable oil.  Desserts Eat fruit, low-fat yogurt, and fruit ices instead of pastries, cake, and cookies. Sherbet, angel food cake, gelatin dessert, frozen low-fat yogurt, or other frozen products that do not contain saturated fat (pure fruit juice bars, frozen ice pops) are also acceptable.   COOKING METHODS Choose those methods that use little or no fat. They include: Poaching.  Braising.  Steaming.  Grilling.  Baking.  Stir-frying.  Broiling.  Microwaving.  Foods can be cooked in a nonstick pan without added fat, or use a nonfat cooking spray in regular cookware. Limit fried foods and avoid frying in saturated fat. Add moisture to lean meats by using water, broth, cooking wines, and other nonfat or low-fat sauces along with the cooking methods mentioned above. Soups and stews should be chilled after cooking. The fat that forms on top after a few hours in the refrigerator should be skimmed off. When preparing meals, avoid using excess salt. Salt can contribute to raising blood pressure in some people.  EATING AWAY FROM HOME Order entres, potatoes, and vegetables without sauces or butter. When meat exceeds the size of a deck of cards (3 to 4 ounces), the rest can be taken home for another meal. Choose vegetable or fruit salads and ask for low-calorie salad dressings to be served on the side. Use dressings sparingly. Limit high-fat toppings, such as bacon, crumbled eggs, cheese, sunflower seeds, and olives. Ask for heart-healthy tub margarine instead of butter.

## 2017-03-15 NOTE — H&P (Signed)
Primary Care Physician:  [name], MD Primary Gastroenterologist:  Dr. Oneida Alar  Pre-Procedure History & Physical: HPI:  Christopher Lang is a 62 y.o. male here for atypical cells IN [facility].  Past Medical History:  Diagnosis Date  . Cataract    had removed Dec 2017/Jan 2018  . COPD (chronic obstructive pulmonary disease) ([place])   . Essential hypertension   . History of atrial fibrillation    November 2017  . Hyperlipidemia   . Sleep apnea    COULDNT TOLERATE CPAP  . Tubulovillous adenoma of rectum 04/2010   4 ADDITIONAL SIMPLE ADENOMAS    Past Surgical History:  Procedure Laterality Date  . CATARACT EXTRACTION W/PHACO Right 01/16/2016   Procedure: CATARACT EXTRACTION PHACO AND INTRAOCULAR LENS PLACEMENT RIGHT EYE CDE= 6.97;  Surgeon: Tonny Branch, MD;  Location: [facility];  Service: Ophthalmology;  Laterality: Right;  right  . CATARACT EXTRACTION W/PHACO Left 01/30/2016   Procedure: CATARACT EXTRACTION PHACO AND INTRAOCULAR LENS PLACEMENT LEFT EYE CDE 3.13;  Surgeon: Tonny Branch, MD;  Location: [facility];  Service: Ophthalmology;  Laterality: Left;  left  . COLONOSCOPY  2012  . COLONOSCOPY N/A 12/04/2016   Procedure: COLONOSCOPY;  Surgeon: Danie Binder, MD;  Location: [facility];  Service: Endoscopy;  Laterality: N/A;  11:15am  . ESOPHAGOGASTRODUODENOSCOPY N/A 12/04/2016   Procedure: ESOPHAGOGASTRODUODENOSCOPY (EGD);  Surgeon: Danie Binder, MD;  Location: [facility];  Service: Endoscopy;  Laterality: N/A;  . EXCISION, SKI  08/2016   R THIGH    Prior to Admission medications   Medication Sig Start Date End Date Taking? Authorizing Provider  aspirin EC 81 MG tablet Take 81 mg by mouth daily.   Yes [provider]  atorvastatin (LIPITOR) 10 MG tablet Take 1 tablet (10 mg total) by mouth daily at 6 PM. 10/06/16  Yes [name], MD  cholecalciferol (VITAMIN D) 1000 units tablet Take 1,000 Units by mouth daily.   Yes [provider]   diltiazem (CARDIZEM CD) 180 MG 24 hr capsule Take 1 capsule (180 mg total) by mouth daily. 10/06/16  Yes [name], MD  fish oil-omega-3 fatty acids 1000 MG capsule Take 1,000 mg by mouth daily.     Yes [provider]  losartan-hydrochlorothiazide (HYZAAR) 100-25 MG tablet Take 1 tablet by mouth daily. 10/06/16  Yes [name], MD  pantoprazole (PROTONIX) 40 MG tablet 1 po 30 mins prior to first meal Patient taking differently: Take 40 mg by mouth daily before breakfast.  12/04/16  Yes Danie Binder, MD    Allergies as of 02/03/2017  . (No Known Allergies)    Family History  Problem Relation Age of Onset  . CAD Mother   . High blood pressure Mother   . Heart attack Mother   . Leukemia Father   . Early death Father   . Heart attack Sister   . Diabetes Sister   . Cancer Brother   . Deep vein thrombosis Brother   . Hypertension Daughter   . Stroke Sister   . Stroke Sister   . Heart attack Sister   . Deep vein thrombosis Sister   . Hypertension Sister   . Diabetes Sister   . Arthritis Sister   . Arthritis Sister   . Arthritis Sister   . Hypertension Sister   . Heart attack Sister   . Cancer Brother        KIDNEY  . Kidney disease Brother   .  Deep vein thrombosis Brother   . Seizures Brother   . Heart attack Brother   . Colon cancer Neg Hx   . Colon polyps Neg Hx     Social History   Socioeconomic History  . Marital status: Married    Spouse name: [name]  . Number of children: 2  . Years of education: GED  . Highest education level: Not on file  Social Needs  . Financial resource strain: Not on file  . Food insecurity - worry: Not on file  . Food insecurity - inability: Not on file  . Transportation needs - medical: Not on file  . Transportation needs - non-medical: Not on file  Occupational History  . 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 Smoking status: Former Smoker    Packs/day: 1.00    Years:  45.00    Pack years: 45.00    Types: Cigarettes    Last attempt to quit: 02/28/2017    Years since quitting: 0.0  . Smokeless tobacco: Never Used  Substance and Sexual Activity  . Alcohol use: No    Alcohol/week: 0.0 oz    Comment: Quit in November  . Drug use: No  . Sexual activity: Yes    Birth control/protection: Post-menopausal  Other Topics Concern  . Not on file  Social History Narrative   Lives with wife [name]   Leisure - couch potato    Review of Systems: See HPI, otherwise negative ROS   Physical Exam: BP (!) 154/75   Pulse 60   Temp 97.7 F (36.5 C) (Oral)   Resp 14   Ht 6' (1.829 m)   Wt 206 lb (93.4 kg)   SpO2 97%   BMI 27.94 kg/m  General:   Alert,  pleasant and cooperative in NAD Head:  Normocephalic and atraumatic. Neck:  Supple; Lungs:  Clear throughout to auscultation.    Heart:  Regular rate and rhythm. Abdomen:  Soft, nontender and nondistended. Normal bowel sounds, without guarding, and without rebound.   Neurologic:  Alert and  oriented x4;  grossly normal neurologically.  Impression/Plan:     Atypical cells  PLAN:  REPEAT EGD TO asesss atypical cells.

## 2017-03-17 NOTE — Progress Notes (Signed)
PT is aware.

## 2017-03-19 ENCOUNTER — Encounter: Payer: Self-pay | Admitting: Family Medicine

## 2017-03-19 ENCOUNTER — Ambulatory Visit (INDEPENDENT_AMBULATORY_CARE_PROVIDER_SITE_OTHER): Payer: Medicare HMO | Admitting: Family Medicine

## 2017-03-19 ENCOUNTER — Other Ambulatory Visit: Payer: Self-pay

## 2017-03-19 VITALS — BP 138/70 | HR 80 | Temp 98.9°F | Resp 18 | Ht 72.0 in | Wt 217.1 lb

## 2017-03-19 DIAGNOSIS — G4733 Obstructive sleep apnea (adult) (pediatric): Secondary | ICD-10-CM | POA: Diagnosis not present

## 2017-03-19 DIAGNOSIS — I10 Essential (primary) hypertension: Secondary | ICD-10-CM

## 2017-03-19 DIAGNOSIS — G8929 Other chronic pain: Secondary | ICD-10-CM | POA: Diagnosis not present

## 2017-03-19 DIAGNOSIS — E782 Mixed hyperlipidemia: Secondary | ICD-10-CM

## 2017-03-19 DIAGNOSIS — M25561 Pain in right knee: Secondary | ICD-10-CM

## 2017-03-19 DIAGNOSIS — Z23 Encounter for immunization: Secondary | ICD-10-CM | POA: Diagnosis not present

## 2017-03-19 MED ORDER — PNEUMOCOCCAL VAC POLYVALENT 25 MCG/0.5ML IJ INJ: 0.5000 mL | INJECTION | Freq: Once | INTRAMUSCULAR | Status: DC

## 2017-03-19 NOTE — Patient Instructions (Signed)
Need the pneumovax today  I have ordered a sleep study  I have ordered an orthopedic consult  Need follow up in 6 months We will call you with appointment

## 2017-03-22 NOTE — Progress Notes (Signed)
Chief Complaint  Patient presents with  . Follow-up   Patient is here for a routine follow-up. He has quit smoking.  He is congratulated on this effort. He tells me today he has chronic right knee pain.  It is bothering him progressively recently.  It pops and clicks when he flexes and extends it.  He has not seen had it seen or x-ray.  He would like to be referred to an orthopedic surgeon.  This is done today.  He does take occasional over-the-counter medicine.  He has not had any falls or injuries. He tells me that he was once diagnosed with sleep apnea.  He is not currently using any CPAP or mask.  He would like to have this reevaluated.  His wife is complaining that she increasingly has difficulty sleeping because of his snoring.  He does have some daytime drowsiness.  He is a self-described "couch potato" falls asleep frequently during the day. His blood pressure is well controlled.  He is compliant with medications. He is not compliant with diet or exercise.  Patient Active Problem List   Diagnosis Date Noted  . Abnormal findings on examination of gastrointestinal tract   . Gastritis due to Helicobacter species 93/26/7124  . Anemia   . Normocytic anemia 10/28/2016  . Atherosclerosis of aorta ([place]) 09/29/2016  . Tubular adenoma of colon 09/03/2016  . Smokes with greater than 30 pack year history 09/03/2016  . Atrial fibrillation with rapid ventricular response ([place]) 12/02/2015  . Essential hypertension 12/02/2015  . Hyperlipidemia 12/02/2015  . COPD (chronic obstructive pulmonary disease) ([place]) 12/02/2015  . Tobacco abuse 12/02/2015    Outpatient Encounter Medications as of 03/19/2017  Medication Sig  . aspirin EC 81 MG tablet Take 81 mg by mouth daily.  Marland Kitchen atorvastatin (LIPITOR) 10 MG tablet Take 1 tablet (10 mg total) by mouth daily at 6 PM.  . cholecalciferol (VITAMIN D) 1000 units tablet Take 1,000 Units by mouth daily.  Marland Kitchen diltiazem (CARDIZEM CD) 180 MG 24 hr capsule Take  1 capsule (180 mg total) by mouth daily.  . fish oil-omega-3 fatty acids 1000 MG capsule Take 1,000 mg by mouth daily.    . Lidocaine-Hydrocortisone Ace 3-2.5 % KIT APPLY TO RECTUM QID FOR 2 WEEKS  . losartan-hydrochlorothiazide (HYZAAR) 100-25 MG tablet Take 1 tablet by mouth daily.  . pantoprazole (PROTONIX) 40 MG tablet 1 po 30 mins prior to first meal (Patient taking differently: Take 40 mg by mouth daily before breakfast. )  . [DISCONTINUED] pneumococcal 23 valent vaccine (PNU-IMMUNE) injection 0.5 mL    No facility-administered encounter medications on file as of 03/19/2017.     No Known Allergies  Review of Systems  Constitutional: Negative for activity change, appetite change and unexpected weight change.  HENT: Negative for congestion and dental problem.   Eyes: Negative for photophobia and visual disturbance.  Respiratory: Positive for shortness of breath. Negative for cough and wheezing.        DOE, improving  Cardiovascular: Negative for chest pain, palpitations and leg swelling.  Gastrointestinal: Negative for blood in stool, constipation and diarrhea.  Genitourinary: Negative for difficulty urinating and frequency.  Musculoskeletal: Positive for gait problem. Negative for arthralgias and back pain.       Calves cramp when walking.  Right knee joint pain and clicking  Skin: Negative for color change.  Neurological: Negative for dizziness and light-headedness.  Hematological: Negative for adenopathy. Does not bruise/bleed easily.  Psychiatric/Behavioral: Negative for sleep disturbance. The patient  is not nervous/anxious.     BP 138/70   Pulse 80   Temp 98.9 F (37.2 C) (Temporal)   Resp 18   Ht 6' (1.829 m)   Wt 217 lb 1.9 oz (98.5 kg)   SpO2 99%   BMI 29.45 kg/m   Physical Exam  Constitutional: He is oriented to person, place, and time. He appears well-developed and well-nourished.  HENT:  Head: Normocephalic and atraumatic.  Mouth/Throat: Oropharynx is clear  and moist.  Eyes: Conjunctivae are normal. Pupils are equal, round, and reactive to light.  Neck: Normal range of motion. Neck supple. No thyromegaly present.  Cardiovascular: Normal rate, regular rhythm and normal heart sounds.  Pulmonary/Chest: Effort normal and breath sounds normal. No respiratory distress.  Abdominal: Soft. Bowel sounds are normal.  Musculoskeletal: Normal range of motion. He exhibits no edema.  Right knee has a patellar click with extension no warmth or effusion.  No joint line tenderness.  Lymphadenopathy:    He has no cervical adenopathy.  Neurological: He is alert and oriented to person, place, and time.  Gait normal  Skin: Skin is warm and dry.  Psychiatric: He has a normal mood and affect. His behavior is normal. Thought content normal.  Nursing note and vitals reviewed.   ASSESSMENT/PLAN:  1. Essential hypertension   2. Mixed hyperlipidemia   3. Obstructive sleep apnea syndrome  - Ambulatory referral to Sleep Studies  4. Chronic pain of right knee  - Ambulatory referral to Orthopedic Surgery  5. Need for pneumococcal vaccination  - Pneumococcal polysaccharide vaccine 23-valent greater than or equal to 2yo subcutaneous/IM   Patient Instructions  Need the pneumovax today  I have ordered a sleep study  I have ordered an orthopedic consult  Need follow up in 6 months We will call you with appointment   [name], MD

## 2017-03-29 ENCOUNTER — Encounter: Payer: Self-pay | Admitting: Family Medicine

## 2017-04-05 DIAGNOSIS — I10 Essential (primary) hypertension: Secondary | ICD-10-CM | POA: Diagnosis not present

## 2017-04-05 DIAGNOSIS — Z809 Family history of malignant neoplasm, unspecified: Secondary | ICD-10-CM | POA: Diagnosis not present

## 2017-04-05 DIAGNOSIS — Z823 Family history of stroke: Secondary | ICD-10-CM | POA: Diagnosis not present

## 2017-04-05 DIAGNOSIS — Z7982 Long term (current) use of aspirin: Secondary | ICD-10-CM | POA: Diagnosis not present

## 2017-04-05 DIAGNOSIS — Z803 Family history of malignant neoplasm of breast: Secondary | ICD-10-CM | POA: Diagnosis not present

## 2017-04-05 DIAGNOSIS — I4891 Unspecified atrial fibrillation: Secondary | ICD-10-CM | POA: Diagnosis not present

## 2017-04-05 DIAGNOSIS — E785 Hyperlipidemia, unspecified: Secondary | ICD-10-CM | POA: Diagnosis not present

## 2017-04-05 DIAGNOSIS — Z833 Family history of diabetes mellitus: Secondary | ICD-10-CM | POA: Diagnosis not present

## 2017-04-05 DIAGNOSIS — Z8249 Family history of ischemic heart disease and other diseases of the circulatory system: Secondary | ICD-10-CM | POA: Diagnosis not present

## 2017-04-05 DIAGNOSIS — K219 Gastro-esophageal reflux disease without esophagitis: Secondary | ICD-10-CM | POA: Diagnosis not present

## 2017-04-06 ENCOUNTER — Other Ambulatory Visit: Payer: Self-pay | Admitting: Family Medicine

## 2017-04-06 ENCOUNTER — Telehealth: Payer: Self-pay | Admitting: Family Medicine

## 2017-04-06 DIAGNOSIS — G4733 Obstructive sleep apnea (adult) (pediatric): Secondary | ICD-10-CM

## 2017-04-06 NOTE — Telephone Encounter (Signed)
I got a referral message stating that in order for patient to be evaluated, the order for sleep study needed to be a polysomnography order as opposed to a referral. Please advise.

## 2017-04-06 NOTE — Progress Notes (Unsigned)
p 

## 2017-04-06 NOTE — Telephone Encounter (Signed)
done

## 2017-04-08 ENCOUNTER — Other Ambulatory Visit: Payer: Self-pay

## 2017-04-08 DIAGNOSIS — E782 Mixed hyperlipidemia: Secondary | ICD-10-CM

## 2017-04-08 DIAGNOSIS — I10 Essential (primary) hypertension: Secondary | ICD-10-CM

## 2017-04-08 MED ORDER — HYDROCHLOROTHIAZIDE 25 MG PO TABS: 25.0000 mg | ORAL_TABLET | Freq: Every day | ORAL | 0 refills | Status: DC

## 2017-04-08 MED ORDER — LOSARTAN POTASSIUM 100 MG PO TABS: 100.0000 mg | ORAL_TABLET | Freq: Every day | ORAL | 0 refills | Status: DC

## 2017-04-12 ENCOUNTER — Encounter: Payer: Self-pay | Admitting: Orthopedic Surgery

## 2017-04-12 ENCOUNTER — Ambulatory Visit (INDEPENDENT_AMBULATORY_CARE_PROVIDER_SITE_OTHER): Payer: Medicare HMO

## 2017-04-12 ENCOUNTER — Ambulatory Visit: Payer: Medicare HMO | Admitting: Orthopedic Surgery

## 2017-04-12 VITALS — BP 140/75 | HR 76 | Ht 72.0 in | Wt 221.0 lb

## 2017-04-12 DIAGNOSIS — M25511 Pain in right shoulder: Secondary | ICD-10-CM | POA: Diagnosis not present

## 2017-04-12 DIAGNOSIS — M75101 Unspecified rotator cuff tear or rupture of right shoulder, not specified as traumatic: Secondary | ICD-10-CM | POA: Diagnosis not present

## 2017-04-12 MED ORDER — NAPROXEN 500 MG PO TABS: 500.0000 mg | ORAL_TABLET | Freq: Two times a day (BID) | ORAL | 0 refills | Status: DC

## 2017-04-12 NOTE — Patient Instructions (Signed)
Shoulder Pain Many things can cause shoulder pain, including:  An injury.  Moving the arm in the same way again and again (overuse).  Joint pain (arthritis).  Follow these instructions at home: Take these actions to help with your pain:  Squeeze a soft ball or a foam pad as much as you can. This helps to prevent swelling. It also makes the arm stronger.  Take over-the-counter and prescription medicines only as told by your doctor.  If told, put ice on the area: ? Put ice in a plastic bag. ? Place a towel between your skin and the bag. ? Leave the ice on for 20 minutes, 2-3 times per day. Stop putting on ice if it does not help with the pain.  If you were given a shoulder sling or immobilizer: ? Wear it as told. ? Remove it to shower or bathe. ? Move your arm as little as possible. ? Keep your hand moving. This helps prevent swelling.  Contact a doctor if:  Your pain gets worse.  Medicine does not help your pain.  You have new pain in your arm, hand, or fingers. Get help right away if:  Your arm, hand, or fingers: ? Tingle. ? Are numb. ? Are swollen. ? Are painful. ? Turn white or blue. This information is not intended to replace advice given to you by your health care provider. Make sure you discuss any questions you have with your health care provider. Document Released: 06/24/2007 Document Revised: 09/01/2015 Document Reviewed: 04/30/2014 Elsevier Interactive Patient Education  2018 [employer].  Shoulder Range of Motion Exercises Shoulder range of motion (ROM) exercises are designed to keep the shoulder moving freely. They are often recommended for people who have shoulder pain. Phase 1 exercises When you are able, do this exercise 5-6 days per week, or as told by your health care provider. Work toward doing 2 sets of 10 swings. Pendulum Exercise How To Do This Exercise Lying Down 1. Lie face-down on a bed with your abdomen close to the side of the  bed. 2. Let your arm hang over the side of the bed. 3. Relax your shoulder, arm, and hand. 4. Slowly and gently swing your arm forward and back. Do not use your neck muscles to swing your arm. They should be relaxed. If you are struggling to swing your arm, have someone gently swing it for you. When you do this exercise for the first time, swing your arm at a 15 degree angle for 15 seconds, or swing your arm 10 times. As pain lessens over time, increase the angle of the swing to 30-45 degrees. 5. Repeat steps 1-4 with the other arm.  How To Do This Exercise While Standing 1. Stand next to a sturdy chair or table and hold on to it with your hand. 1. Bend forward at the waist. 2. Bend your knees slightly. 3. Relax your other arm and let it hang limp. 4. Relax the shoulder blade of the arm that is hanging and let it drop. 5. While keeping your shoulder relaxed, use body motion to swing your arm in small circles. The first time you do this exercise, swing your arm for about 30 seconds or 10 times. When you do it next time, swing your arm for a little longer. 6. Stand up tall and relax. 7. Repeat steps 1-7, this time changing the direction of the circles. 2. Repeat steps 1-8 with the other arm.  Phase 2 exercises Do these exercises 3-4  times per day on 5-6 days per week or as told by your health care provider. Work toward holding the stretch for 20 seconds. Stretching Exercise 1 1. Lift your arm straight out in front of you. 2. Bend your arm 90 degrees at the elbow (right angle) so your forearm goes across your body and looks like the letter "L." 3. Use your other arm to gently pull the elbow forward and across your body. 4. Repeat steps 1-3 with the other arm. Stretching Exercise 2 You will need a towel or rope for this exercise. 1. Bend one arm behind your back with the palm facing outward. 2. Hold a towel with your other hand. 3. Reach the arm that holds the towel above your head, and bend  that arm at the elbow. Your wrist should be behind your neck. 4. Use your free hand to grab the free end of the towel. 5. With the higher hand, gently pull the towel up behind you. 6. With the lower hand, pull the towel down behind you. 7. Repeat steps 1-6 with the other arm.  Phase 3 exercises Do each of these exercises at four different times of day (sessions) every day or as told by your health care provider. To begin with, repeat each exercise 5 times (repetitions). Work toward doing 3 sets of 12 repetitions or as told by your health care provider. Strengthening Exercise 1 You will need a light weight for this activity. As you grow stronger, you may use a heavier weight. 1. Standing with a weight in your hand, lift your arm straight out to the side until it is at the same height as your shoulder. 2. Bend your arm at 90 degrees so that your fingers are pointing to the ceiling. 3. Slowly raise your hand until your arm is straight up in the air. 4. Repeat steps 1-3 with the other arm.  Strengthening Exercise 2 You will need a light weight for this activity. As you grow stronger, you may use a heavier weight. 1. Standing with a weight in your hand, gradually move your straight arm in an arc, starting at your side, then out in front of you, then straight up over your head. 2. Gradually move your other arm in an arc, starting at your side, then out in front of you, then straight up over your head. 3. Repeat steps 1-2 with the other arm.  Strengthening Exercise 3 You will need an elastic band for this activity. As you grow stronger, gradually increase the size of the bands or increase the number of bands that you use at one time. 1. While standing, hold an elastic band in one hand and raise that arm up in the air. 2. With your other hand, pull down the band until that hand is by your side. 3. Repeat steps 1-2 with the other arm.  This information is not intended to replace advice given to you  by your health care provider. Make sure you discuss any questions you have with your health care provider. Document Released: 10/04/2002 Document Revised: 09/01/2015 Document Reviewed: 01/01/2014 Elsevier Interactive Patient Education  [employer].

## 2017-04-12 NOTE — Progress Notes (Signed)
Patient ID: Christopher Lang, male   DOB: 1955/03/21, 62 y.o.   MRN: [id number]  Chief Complaint  Patient presents with  . Shoulder Pain    right     HPI Christopher Lang is a 62 y.o. male.  Presents for evaluation of 1 year history of shoulder pain  The patient reports no history of trauma he can put complains of right shoulder pain dull aching getting worse no history of trauma painful range of motion with forward elevation nocturnal pain mild weakness pain described as a dull ache  Review of Systems Review of Systems  Constitutional: Negative.   Musculoskeletal: Negative for neck pain.  Skin: Negative.   Neurological: Negative for tingling and focal weakness.    Past Medical History:  Diagnosis Date  . Cataract    had removed Dec 2017/Jan 2018  . COPD (chronic obstructive pulmonary disease) ([place])   . Essential hypertension   . History of atrial fibrillation    November 2017  . Hyperlipidemia   . Sleep apnea    COULDN'T TOLERATE CPAP  . Tubulovillous adenoma of rectum 04/2010   4 ADDITIONAL SIMPLE ADENOMAS    Past Surgical History:  Procedure Laterality Date  . CATARACT EXTRACTION W/PHACO Right 01/16/2016   Procedure: CATARACT EXTRACTION PHACO AND INTRAOCULAR LENS PLACEMENT RIGHT EYE CDE= 6.97;  Surgeon: Tonny Branch, MD;  Location: [facility];  Service: Ophthalmology;  Laterality: Right;  right  . CATARACT EXTRACTION W/PHACO Left 01/30/2016   Procedure: CATARACT EXTRACTION PHACO AND INTRAOCULAR LENS PLACEMENT LEFT EYE CDE 3.13;  Surgeon: Tonny Branch, MD;  Location: [facility];  Service: Ophthalmology;  Laterality: Left;  left  . COLONOSCOPY  2012  . COLONOSCOPY N/A 12/04/2016   Procedure: COLONOSCOPY;  Surgeon: Danie Binder, MD;  Location: [facility];  Service: Endoscopy;  Laterality: N/A;  11:15am  . ESOPHAGOGASTRODUODENOSCOPY N/A 12/04/2016   Procedure: ESOPHAGOGASTRODUODENOSCOPY (EGD);  Surgeon: Danie Binder, MD;  Location: [facility];  Service: Endoscopy;   Laterality: N/A;  . ESOPHAGOGASTRODUODENOSCOPY N/A 03/15/2017   Procedure: ESOPHAGOGASTRODUODENOSCOPY (EGD);  Surgeon: Danie Binder, MD;  Location: [facility];  Service: Endoscopy;  Laterality: N/A;  9:30 AM  . EXCISION, SKI  08/2016   R THIGH    Family History  Problem Relation Age of Onset  . CAD Mother   . High blood pressure Mother   . Heart attack Mother   . Leukemia Father   . Early death Father   . Heart attack Sister   . Diabetes Sister   . Cancer Brother   . Deep vein thrombosis Brother   . Hypertension Daughter   . Stroke Sister   . Stroke Sister   . Heart attack Sister   . Deep vein thrombosis Sister   . Hypertension Sister   . Diabetes Sister   . Arthritis Sister   . Arthritis Sister   . Arthritis Sister   . Hypertension Sister   . Heart attack Sister   . Cancer Brother        KIDNEY  . Kidney disease Brother   . Deep vein thrombosis Brother   . Seizures Brother   . Heart attack Brother   . Colon cancer Neg Hx   . Colon polyps Neg Hx      Social History   Tobacco Use  . Smoking status: Former Smoker    Packs/day: 1.00    Years: 45.00    Pack years: 45.00    Types: Cigarettes  Last attempt to quit: 02/28/2017    Years since quitting: 0.1  . Smokeless tobacco: Never Used  Substance Use Topics  . Alcohol use: No    Alcohol/week: 0.0 oz    Comment: Quit in November  . Drug use: No    No Known Allergies   Current Meds  Medication Sig  . aspirin EC 81 MG tablet Take 81 mg by mouth daily.  Marland Kitchen atorvastatin (LIPITOR) 10 MG tablet Take 1 tablet (10 mg total) by mouth daily at 6 PM.  . cholecalciferol (VITAMIN D) 1000 units tablet Take 1,000 Units by mouth daily.  Marland Kitchen diltiazem (CARDIZEM CD) 180 MG 24 hr capsule Take 1 capsule (180 mg total) by mouth daily.  . fish oil-omega-3 fatty acids 1000 MG capsule Take 1,000 mg by mouth daily.    . hydrochlorothiazide (HYDRODIURIL) 25 MG tablet Take 1 tablet (25 mg total) by mouth daily.  .  Lidocaine-Hydrocortisone Ace 3-2.5 % KIT APPLY TO RECTUM QID FOR 2 WEEKS  . losartan (COZAAR) 100 MG tablet Take 1 tablet (100 mg total) by mouth daily.  . pantoprazole (PROTONIX) 40 MG tablet 1 po 30 mins prior to first meal (Patient taking differently: Take 40 mg by mouth daily before breakfast. )       Physical Exam BP 140/75   Pulse 76   Ht 6' (1.829 m)   Wt 221 lb (100.2 kg)   BMI 29.97 kg/m  Physical Exam  Constitutional: He is oriented to person, place, and time. He appears well-developed and well-nourished.  Vital signs have been reviewed and are stable. Gen. appearance the patient is well-developed and well-nourished with normal grooming and hygiene.   Neurological: He is alert and oriented to person, place, and time.  Skin: Skin is warm and dry. No erythema.  Psychiatric: He has a normal mood and affect.  Vitals reviewed.  Ambulatory status normal with no assistive devices Right Shoulder Exam   Tenderness  The patient is experiencing tenderness in the acromion.  Range of Motion  Active abduction: abnormal  Passive abduction: abnormal  Extension: abnormal  External rotation: abnormal  Forward flexion: abnormal  Internal rotation 0 degrees: abnormal  Internal rotation 90 degrees: abnormal   Muscle Strength  The patient has normal right shoulder strength.  Tests  Apprehension: negative Hawkins test: negative Cross arm: negative Impingement: positive Drop arm: negative Sulcus: absent  Other  Erythema: absent Sensation: normal Pulse: present   Left Shoulder Exam  Left shoulder exam is normal.  Tenderness  The patient is experiencing no tenderness.   Range of Motion  The patient has normal left shoulder ROM.  Muscle Strength  The patient has normal left shoulder strength.  Tests  Apprehension: negative  Other  Erythema: absent Sensation: normal Pulse: present       Provocative test  drop arm test normal Painful arc 90-120  degrees Empty can normal Jobst test negative Infraspinatus muscle strength test normal  external rotation lag sign negative Liftoff normal Belly press test normal Supraspinatus motor exam normal   MEDICAL DECISION SECTION  xrays ordered?  Yes  My independent reading of xrays: See the dictated report normal shoulder   Encounter Diagnoses  Name Primary?  . Acute pain of right shoulder   . Rotator cuff syndrome of right shoulder Yes     PLAN:   Meds ordered this encounter  Medications  . naproxen (NAPROSYN) 500 MG tablet    Sig: Take 1 tablet (500 mg total) by mouth 2 (two)  times daily with a meal.    Dispense:  84 tablet    Refill:  0   Injection?  Yes   Procedure note the subacromial injection shoulder RIGHT  Verbal consent was obtained to inject the  RIGHT   Shoulder  Timeout was completed to confirm the injection site is a subacromial space of the  RIGHT  shoulder   Medication used Depo-Medrol 40 mg and lidocaine 1% 3 cc  Anesthesia was provided by ethyl chloride  The injection was performed in the RIGHT  posterior subacromial space. After pinning the skin with alcohol and anesthetized the skin with ethyl chloride the subacromial space was injected using a 20-gauge needle. There were no complications  Sterile dressing was applied.   MRI/CT/? No  Home exercise program  No follow-up was scheduled expect relief over the next 6 weeks

## 2017-04-15 ENCOUNTER — Other Ambulatory Visit: Payer: Self-pay | Admitting: Family Medicine

## 2017-04-15 DIAGNOSIS — G4733 Obstructive sleep apnea (adult) (pediatric): Secondary | ICD-10-CM

## 2017-04-19 ENCOUNTER — Encounter: Payer: Self-pay | Admitting: Gastroenterology

## 2017-04-26 ENCOUNTER — Telehealth: Payer: Self-pay

## 2017-04-26 NOTE — Telephone Encounter (Signed)
Pt said he came by the office and made appt to see Dr. Oneida Alar in July. However, he is having problems with his bottom and his rectal area is very irritated. His stools are formed and hard. He will have a BM one day and then maybe skip a day and then have 2-3 Bm's in a day, but they are all formed and hard. He said the Lidocaine cream that Dr. Oneida Alar gave him did not help, only burned. Michela Pitcher he is so irritated that he has to sit side ways. He sometimes sees a tiny bit of red blood on tissue after he has tried to clean himself, but no blood in stool. S Sending to Roseanne Kaufman, NP to advise today in Dr. Nona Dell absence.

## 2017-04-26 NOTE — Telephone Encounter (Signed)
Colonoscopy on file Nov 2018. Internal and external hemorrhoids, moderate in size.   Needs to avoid constipation. Add fiber to diet if not already (Metamucil, Benefiber) Drink at least 8 glasses of water a day. May use Miralax prn daily for constipation. IF significant issues with rectum, needs office visit for assessment. May use preparation H OTC or you can call in anusol cream apply BID per rectum.

## 2017-04-27 ENCOUNTER — Encounter: Payer: Self-pay | Admitting: Gastroenterology

## 2017-04-27 ENCOUNTER — Ambulatory Visit (INDEPENDENT_AMBULATORY_CARE_PROVIDER_SITE_OTHER): Payer: Medicare HMO | Admitting: Gastroenterology

## 2017-04-27 ENCOUNTER — Telehealth: Payer: Self-pay | Admitting: Gastroenterology

## 2017-04-27 DIAGNOSIS — L290 Pruritus ani: Secondary | ICD-10-CM | POA: Diagnosis not present

## 2017-04-27 NOTE — Telephone Encounter (Signed)
I called pt about 2:57 pm and he is on his way.  Staff was informed.

## 2017-04-27 NOTE — Telephone Encounter (Signed)
Tried to call pt, Vm not set up.

## 2017-04-27 NOTE — Progress Notes (Signed)
Primary Care Physician:  [name], MD  Primary GI: Dr. Oneida Alar   Chief Complaint  Patient presents with  . Rectal Pain    since had tcs; worse with bm; has seen blood with cleansing rectal are  . Bloated    HPI:   Christopher Lang is a 62 y.o. male presenting today with a history of adenomas, surveillance due in 2021, H.pylori gastritis with atypical lymphoid proliferation in Nov 2018 and subsequent EGD in Feb 2019 with negative H.pylori, no abnormal path. He has known moderate sized external and internal hemorrhoids noted on recent colonoscopy. Brought in today as an urgent work-in due to rectal concerns.   When having a BM, itches and burns. No issues in between BMs. Used prep H today. Has been using intermittently. Got Tucks pads today but hasn't started.  Using cream from [place] but just causing burning. Doesn't feel any bulging. Notes some intermittent rectal bleeding with wiping. BM every other day. No straining. Believes it is soft. Denies knife-like pain. Symptoms started shortly after colonoscopy.   Wife states he uses soapy washcloths after every BM and scrubs very hard. No rectal discomfort unless he has had a BM. Occasional fecal seepage. Sits on toilet and reads.   Past Medical History:  Diagnosis Date  . Cataract    had removed Dec 2017/Jan 2018  . COPD (chronic obstructive pulmonary disease) ([place])   . Essential hypertension   . H. pylori infection 11/2016   With documented eradication via biopsy Feb 2019  . History of atrial fibrillation    November 2017  . Hyperlipidemia   . Sleep apnea    COULDN'T TOLERATE CPAP  . Tubulovillous adenoma of rectum 04/2010   4 ADDITIONAL SIMPLE ADENOMAS    Past Surgical History:  Procedure Laterality Date  . CATARACT EXTRACTION W/PHACO Right 01/16/2016   Procedure: CATARACT EXTRACTION PHACO AND INTRAOCULAR LENS PLACEMENT RIGHT EYE CDE= 6.97;  Surgeon: Tonny Branch, MD;  Location: [facility];  Service:  Ophthalmology;  Laterality: Right;  right  . CATARACT EXTRACTION W/PHACO Left 01/30/2016   Procedure: CATARACT EXTRACTION PHACO AND INTRAOCULAR LENS PLACEMENT LEFT EYE CDE 3.13;  Surgeon: Tonny Branch, MD;  Location: [facility];  Service: Ophthalmology;  Laterality: Left;  left  . COLONOSCOPY  2012  . COLONOSCOPY N/A 12/04/2016   three 4-6 mm tubular adenomas, moderate size external and internal hemorrhoids, redundant left colon, 3 year surveillance  . ESOPHAGOGASTRODUODENOSCOPY N/A 12/04/2016   Dr. Oneida Alar: normal esophagus, small hiatal hernia, chronic gastritis with H.pylori, peptic duodenitis, atypical lymphoid proliferation. Surveillance EGD Feb 2019 without abnormal cells, negative H.pylori  . ESOPHAGOGASTRODUODENOSCOPY N/A 03/15/2017   mild gastritis, no atypical cells, negative H.pylori  . EXCISION, SKI  08/2016   R THIGH    Current Outpatient Medications  Medication Sig Dispense Refill  . aspirin EC 81 MG tablet Take 81 mg by mouth daily.    Marland Kitchen atorvastatin (LIPITOR) 10 MG tablet Take 1 tablet (10 mg total) by mouth daily at 6 PM. 90 tablet 3  . cholecalciferol (VITAMIN D) 1000 units tablet Take 1,000 Units by mouth daily.    Marland Kitchen diltiazem (CARDIZEM CD) 180 MG 24 hr capsule Take 1 capsule (180 mg total) by mouth daily. 90 capsule 3  . fish oil-omega-3 fatty acids 1000 MG capsule Take 1,000 mg by mouth daily.      . hydrochlorothiazide (HYDRODIURIL) 25 MG tablet Take 1 tablet (25 mg total) by mouth daily. 90 tablet 0  .  Lidocaine-Glycerin (PREPARATION H RE) Place rectally as needed.    . Lidocaine-Hydrocortisone Ace 3-2.5 % KIT APPLY TO RECTUM QID FOR 2 WEEKS 1 each 0  . losartan (COZAAR) 100 MG tablet Take 1 tablet (100 mg total) by mouth daily. 90 tablet 0  . naproxen (NAPROSYN) 500 MG tablet Take 1 tablet (500 mg total) by mouth 2 (two) times daily with a meal. 84 tablet 0  . pantoprazole (PROTONIX) 40 MG tablet 1 po 30 mins prior to first meal (Patient taking differently: Take 40 mg by  mouth daily before breakfast. ) 30 tablet 11  . polyethylene glycol (MIRALAX / GLYCOLAX) packet Take 17 g by mouth daily as needed.     No current facility-administered medications for this visit.     Allergies as of 04/27/2017  . (No Known Allergies)    Family History  Problem Relation Age of Onset  . CAD Mother   . High blood pressure Mother   . Heart attack Mother   . Leukemia Father   . Early death Father   . Heart attack Sister   . Diabetes Sister   . Cancer Brother   . Deep vein thrombosis Brother   . Hypertension Daughter   . Stroke Sister   . Stroke Sister   . Heart attack Sister   . Deep vein thrombosis Sister   . Hypertension Sister   . Diabetes Sister   . Arthritis Sister   . Arthritis Sister   . Arthritis Sister   . Hypertension Sister   . Heart attack Sister   . Cancer Brother        KIDNEY  . Kidney disease Brother   . Deep vein thrombosis Brother   . Seizures Brother   . Heart attack Brother   . Colon cancer Neg Hx   . Colon polyps Neg Hx     Social History   Socioeconomic History  . Marital status: Married    Spouse name: [name]  . Number of children: 2  . Years of education: GED  . Highest education level: Not on file  Occupational History  . 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 Financial resource strain: Not on file  . Food insecurity:    Worry: Not on file    Inability: Not on file  . Transportation needs:    Medical: Not on file    Non-medical: Not on file  Tobacco Use  . Smoking status: Former Smoker    Packs/day: 1.00    Years: 45.00    Pack years: 45.00    Types: Cigarettes    Last attempt to quit: 02/28/2017    Years since quitting: 0.1  . Smokeless tobacco: Never Used  Substance and Sexual Activity  . Alcohol use: No    Alcohol/week: 0.0 oz    Comment: Quit in November  . Drug use: No  . Sexual activity: Yes    Birth control/protection: Post-menopausal  Lifestyle  . Physical  activity:    Days per week: Not on file    Minutes per session: Not on file  . Stress: Not on file  Relationships  . Social connections:    Talks on phone: Not on file    Gets together: Not on file    Attends religious service: Not on file    Active member of club or organization: Not on file    Attends meetings of clubs or organizations: Not on file  Relationship status: Not on file  Other Topics Concern  . Not on file  Social History Narrative   Lives with wife [name]   Leisure - couch potato    Review of Systems: Gen: Denies fever, chills, anorexia. Denies fatigue, weakness, weight loss.  CV: Denies chest pain, palpitations, syncope, peripheral edema, and claudication. Resp: Denies dyspnea at rest, cough, wheezing, coughing up blood, and pleurisy. GI:see HPI  Derm: Denies rash, itching, dry skin Psych: Denies depression, anxiety, memory loss, confusion. No homicidal or suicidal ideation.  Heme: Denies bruising, bleeding, and enlarged lymph nodes.  Physical Exam: BP (!) 156/82   Pulse 75   Temp (!) 97 F (36.1 C) (Oral)   Ht 6' (1.829 m)   Wt 224 lb 6.4 oz (101.8 kg)   BMI 30.43 kg/m  General:   Alert and oriented. No distress noted. Pleasant and cooperative.  Head:  Normocephalic and atraumatic. Eyes:  Conjuctiva clear without scleral icterus. Mouth:  Oral mucosa pink and moist. Good dentition. No lesions. Abdomen:  +BS, soft, non-tender and non-distended. No rebound or guarding. No HSM or masses noted. Rectal: some fecal seepage, perirectally with irritation superficially, no obvious fissure. No external hemorrhoids. Internal exam without pain but noted uncomfortable. Extremities:  Without edema. Neurologic:  Alert and  oriented x4 Psych:  Alert and cooperative. Normal mood and affect.

## 2017-04-27 NOTE — Telephone Encounter (Signed)
Pt's wife, Christopher Lang, called and said the pt is very miserable on his bottom. She said he is not very constipated. That whenever he has a stool the BM touching his skin is very irritating to the skin.  She said he is doing Metamucil or fiber about every other day. Prep H has not helped previously. He needs his skin to be looked at . I told her that I do not have any appts but I will check with Vicente Males and see what she recommends.

## 2017-04-27 NOTE — Telephone Encounter (Signed)
PATIENT WIFE CALLED WITH SOME CONCERNS ABOUT HER HUSBAND, HIS PRESCRIPTION HAS NOT HELPED AND EVER SINCE HIS PROCEDURE HE HAS HAD A PROBLEM.  [place]

## 2017-04-27 NOTE — Telephone Encounter (Signed)
I see no openings this week. I agree he needs to be seen.  If he is able to come today by 3:30, I will see him. If not, we will need to look at remaining schedule and talk with [facility].

## 2017-04-27 NOTE — Patient Instructions (Addendum)
We want to make sure you are avoiding prolonged sitting or straining.   Add Metamucil or Benefiber daily. Drink at least 8 large glasses of water daily.   Instead of using the wash rag with soap, use the Tucks pads. You can shower as normal but avoid any soaps with perfumes, dyes, lotions, etc. Make sure unscented. Pat dry well after showers, toileting.  Use the over the counter cream that is listed on the handout. Miconazole or Clotrimazole is over the counter. Use this twice a day around rectum, pea sized amount, for 10 days. Let me know in next few days how you are doing. We may add in the preparation H again, but we will see how you do without it.   Keep appointment for July upcoming.   It was a pleasure to see you today. I strive to create trusting relationships with patients to provide genuine, compassionate, and quality care. I value your feedback. If you receive a survey regarding your visit,  I greatly appreciate you taking time to fill this out.   Annitta Needs, PhD, ANP-BC [facility]    Anal Pruritus Anal pruritus is an itchy feeling in the anus and the skin in the anal area. This is common and can be caused by many things. It often occurs when the area becomes moist. Moisture may be due to sweating or a small amount of stool (feces) that is left on the area because of poor personal cleaning. Some other causes include:  Perfumed soaps and sprays.  Colored toilet paper.  Chemicals in the foods that you eat.  Dietary factors, such as caffeine, beer, milk products, chocolate, nuts, citrus fruits, tomatoes, spicy seasonings, jalapeno peppers, and salsa.  Hemorrhoids, fissures, infections, and other anal diseases.  Excessive washing.  Overuse of laxatives.  Skin disorders (psoriasis, eczema, or seborrhea).  Some medical disorders, such as diabetes or thyroid problems.  Diarrhea.  STDs (sexually transmitted diseases).  Some cancers.  In many cases, the  cause is not known. The itching usually goes away with treatment and home care. Scratching can cause further skin damage. Follow these instructions at home: Pay attention to any changes in your symptoms. Take these actions to help with your itching: Skin Care  Practice good hygiene. ? Clean the anal area gently with wet toilet paper, baby wipes, or a wet washcloth after every bowel movement and at bedtime. ? Avoid using soaps on the anal area. ? Dry the area thoroughly. Pat the area dry with toilet paper or a towel.  Do not scrub the anal area with anything, including toilet paper.  Do not scratch the itchy area. Scratching produces more damage and makes the itching worse.  Take sitz baths in warm water as told by your health care provider. Pat the area dry with a soft cloth after each bath.  Use creams or ointments as told by your health care provider. Zinc oxide ointment or a moisture barrier cream can be applied several times per day to protect the skin.  Do not use anything that irritates the skin, such as bubble baths, scented toilet paper, or genital deodorants. General instructions  Take over-the-counter and prescription medicines only as told by your health care provider.  Talk with your health care provider about fiber supplements. These are helpful in keeping your stool normal if you have frequent loose stools.  Wear cotton underwear and loose clothing.  Keep all follow-up visits as told by your health care provider. This is important.  Contact a health care provider if:  Your itching does not improve in several days.  Your itching gets worse.  You have a fever.  You have redness, swelling, or pain in the anal area.  You have fluid, blood, or pus coming from the anal area. This information is not intended to replace advice given to you by your health care provider. Make sure you discuss any questions you have with your health care provider. Document Released:  07/07/2010 Document Revised: 06/13/2015 Document Reviewed: 04/02/2014 Elsevier Interactive Patient Education  2018 [employer].  Hemorrhoids Hemorrhoids are swollen veins in and around the rectum or anus. Hemorrhoids can cause pain, itching, or bleeding. Most of the time, they do not cause serious problems. They usually get better with diet changes, lifestyle changes, and other home treatments. Follow these instructions at home: Eating and drinking  Eat foods that have fiber, such as whole grains, beans, nuts, fruits, and vegetables. Ask your doctor about taking products that have added fiber (fibersupplements).  Drink enough fluid to keep your pee (urine) clear or pale yellow. For Pain and Swelling  Take a warm-water bath (sitz bath) for 20 minutes to ease pain. Do this 3-4 times a day.  If directed, put ice on the painful area. It may be helpful to use ice between your warm baths. ? Put ice in a plastic bag. ? Place a towel between your skin and the bag. ? Leave the ice on for 20 minutes, 2-3 times a day. General instructions  Take over-the-counter and prescription medicines only as told by your doctor. ? Medicated creams and medicines that are inserted into the anus (suppositories) may be used or applied as told.  Exercise often.  Go to the bathroom when you have the urge to poop (to have a bowel movement). Do not wait.  Avoid pushing too hard (straining) when you poop.  Keep the butt area dry and clean. Use wet toilet paper or moist paper towels.  Do not sit on the toilet for a long time. Contact a doctor if:  You have any of these: ? Pain and swelling that do not get better with treatment or medicine. ? Bleeding that will not stop. ? Trouble pooping or you cannot poop. ? Pain or swelling outside the area of the hemorrhoids. This information is not intended to replace advice given to you by your health care provider. Make sure you discuss any questions you have with  your health care provider. Document Released: 10/15/2007 Document Revised: 06/13/2015 Document Reviewed: 09/19/2014 Elsevier Interactive Patient Education  [employer].

## 2017-04-27 NOTE — Telephone Encounter (Signed)
See previous note from yesterday.

## 2017-04-28 NOTE — Telephone Encounter (Signed)
Reviewed

## 2017-04-29 NOTE — Assessment & Plan Note (Signed)
Known history of internal hemorrhoids, prolonged sitting on toilet, some constipation. Symptoms of itching most bothersome symptom. Discussed avoidance of soaps, scented soaps, and appropriate rectal care. Discussed toiletry habits. Avoid sitting prolonged. Add fiber to diet, avoid constipation. Preparation H and lidocaine seem to be worsening. Query yeast-related issues. Trial OTC agents first. May need to add back in Anusol. Call in next few days if no improvement. Keep July appt.

## 2017-04-30 NOTE — Progress Notes (Signed)
cc'ed to pcp °

## 2017-05-03 ENCOUNTER — Telehealth: Payer: Self-pay | Admitting: Gastroenterology

## 2017-05-03 ENCOUNTER — Ambulatory Visit: Payer: Medicare HMO | Attending: Family Medicine | Admitting: Neurology

## 2017-05-03 DIAGNOSIS — G4733 Obstructive sleep apnea (adult) (pediatric): Secondary | ICD-10-CM | POA: Diagnosis not present

## 2017-05-03 NOTE — Telephone Encounter (Signed)
Pt called this morning to let AB know that he was feeling much better.

## 2017-05-03 NOTE — Telephone Encounter (Signed)
WONDERFUL

## 2017-05-03 NOTE — Telephone Encounter (Signed)
Great news.

## 2017-05-21 NOTE — Procedures (Signed)
  [place] A. Merlene Laughter, MD     [email or web address]             NOCTURNAL POLYSOMNOGRAPHY   LOCATION: [facility]  Patient Name: Christopher, Lang Date: 05/03/2017 Gender: Male D.O.B: 28-Jun-1955 Age (years): 62 Referring Provider: [name] Height (inches): 28 Interpreting Physician: Phillips Odor MD, ABSM Weight (lbs): 224 RPSGT: Peak, Robert BMI: 30 MRN: [id number] Neck Size: <br> <br> <br> CLINICAL INFORMATION Sleep Study Type: HST    Indication for sleep study: OSA    Epworth Sleepiness Score: NA  SLEEP STUDY TECHNIQUE A multi-channel overnight portable sleep study was performed. The channels recorded were: nasal airflow, thoracic respiratory movement, and oxygen saturation with a pulse oximetry. Snoring was also monitored.  MEDICATIONS Patient self administered medications include: N/A.  Current Outpatient Medications:  .  aspirin EC 81 MG tablet, Take 81 mg by mouth daily., Disp: , Rfl:  .  atorvastatin (LIPITOR) 10 MG tablet, Take 1 tablet (10 mg total) by mouth daily at 6 PM., Disp: 90 tablet, Rfl: 3 .  cholecalciferol (VITAMIN D) 1000 units tablet, Take 1,000 Units by mouth daily., Disp: , Rfl:  .  diltiazem (CARDIZEM CD) 180 MG 24 hr capsule, Take 1 capsule (180 mg total) by mouth daily., Disp: 90 capsule, Rfl: 3 .  fish oil-omega-3 fatty acids 1000 MG capsule, Take 1,000 mg by mouth daily.  , Disp: , Rfl:  .  hydrochlorothiazide (HYDRODIURIL) 25 MG tablet, Take 1 tablet (25 mg total) by mouth daily., Disp: 90 tablet, Rfl: 0 .  Lidocaine-Glycerin (PREPARATION H RE), Place rectally as needed., Disp: , Rfl:  .  Lidocaine-Hydrocortisone Ace 3-2.5 % KIT, APPLY TO RECTUM QID FOR 2 WEEKS, Disp: 1 each, Rfl: 0 .  losartan (COZAAR) 100 MG tablet, Take 1 tablet (100 mg total) by mouth daily., Disp: 90 tablet, Rfl: 0 .  naproxen (NAPROSYN) 500 MG tablet, Take 1 tablet (500 mg total) by mouth 2 (two) times daily with a meal., Disp: 84  tablet, Rfl: 0 .  pantoprazole (PROTONIX) 40 MG tablet, 1 po 30 mins prior to first meal (Patient taking differently: Take 40 mg by mouth daily before breakfast. ), Disp: 30 tablet, Rfl: 11 .  polyethylene glycol (MIRALAX / GLYCOLAX) packet, Take 17 g by mouth daily as needed., Disp: , Rfl:    SLEEP ARCHITECTURE Patient was studied for 373.5 minutes. The sleep efficiency was 77.8 % and the patient was supine for 13.2%. The arousal index was 0.3 per hour.  RESPIRATORY PARAMETERS The overall AHI was 35.0 per hour, with a central apnea index of 0.0 per hour.  The oxygen nadir was 74% during sleep.    CARDIAC DATA Mean heart rate during sleep was 70.0 bpm.     IMPRESSIONS - Moderate obstructive sleep apnea occurred during this study (AHI = 35.0/h). A formal CPAP titration study is recommended.   Delano Metz, MD Diplomate, American Board of Sleep Medicine.   ELECTRONICALLY SIGNED ON:  05/21/2017, 10:41 AM [facility] PH: [phone]   FX: [phone] [place]

## 2017-05-21 NOTE — Progress Notes (Signed)
Please call and advise that sleep study showed OSA. Needs to schedule follow up office visit. He needs a formal sleep study titration for CPAP. Where do we schedule this?

## 2017-06-24 ENCOUNTER — Encounter: Payer: Self-pay | Admitting: Family Medicine

## 2017-06-25 ENCOUNTER — Encounter: Payer: Self-pay | Admitting: Family Medicine

## 2017-06-30 ENCOUNTER — Other Ambulatory Visit: Payer: Self-pay | Admitting: Family Medicine

## 2017-06-30 NOTE — Progress Notes (Signed)
Cardiology Office Note  Date: 07/01/2017   ID: Christopher Lang, DOB 04-09-1955, MRN [id number]  PCP: Caren Macadam, MD  Primary Cardiologist: Rozann Lesches, MD   Chief Complaint  Patient presents with  . History of atrial fibrillation    History of Present Illness: Christopher Lang is a 62 y.o. male last seen in May 2018.  He presents for a routine visit.  Reports no interval palpitations, lightheadedness, or chest pain.  He quit smoking back in February, has gained weight since then but overall feels better.  He reports NYHA class II dyspnea.  CHADSVASC score is 1.  He is on aspirin, has had no documented recurrent atrial fibrillation in follow-up.  He also remains on Cardizem CD 180 mg daily.  I personally reviewed his ECG today which shows sinus bradycardia.  Past Medical History:  Diagnosis Date  . Cataract    had removed Dec 2017/Jan 2018  . COPD (chronic obstructive pulmonary disease) ([place])   . Essential hypertension   . H. pylori infection 11/2016   With documented eradication via biopsy Feb 2019  . History of atrial fibrillation    November 2017  . Hyperlipidemia   . Sleep apnea    COULDN'T TOLERATE CPAP  . Tubulovillous adenoma of rectum 04/2010   4 ADDITIONAL SIMPLE ADENOMAS    Past Surgical History:  Procedure Laterality Date  . CATARACT EXTRACTION W/PHACO Right 01/16/2016   Procedure: CATARACT EXTRACTION PHACO AND INTRAOCULAR LENS PLACEMENT RIGHT EYE CDE= 6.97;  Surgeon: Tonny Branch, MD;  Location: [facility];  Service: Ophthalmology;  Laterality: Right;  right  . CATARACT EXTRACTION W/PHACO Left 01/30/2016   Procedure: CATARACT EXTRACTION PHACO AND INTRAOCULAR LENS PLACEMENT LEFT EYE CDE 3.13;  Surgeon: Tonny Branch, MD;  Location: [facility];  Service: Ophthalmology;  Laterality: Left;  left  . COLONOSCOPY  2012  . COLONOSCOPY N/A 12/04/2016   three 4-6 mm tubular adenomas, moderate size external and internal hemorrhoids, redundant left colon, 3 year surveillance    . ESOPHAGOGASTRODUODENOSCOPY N/A 12/04/2016   Dr. Oneida Alar: normal esophagus, small hiatal hernia, chronic gastritis with H.pylori, peptic duodenitis, atypical lymphoid proliferation. Surveillance EGD Feb 2019 without abnormal cells, negative H.pylori  . ESOPHAGOGASTRODUODENOSCOPY N/A 03/15/2017   mild gastritis, no atypical cells, negative H.pylori  . EXCISION, SKI  08/2016   R THIGH    Current Outpatient Medications  Medication Sig Dispense Refill  . aspirin EC 81 MG tablet Take 81 mg by mouth daily.    Marland Kitchen atorvastatin (LIPITOR) 10 MG tablet Take 1 tablet (10 mg total) by mouth daily at 6 PM. 90 tablet 3  . cholecalciferol (VITAMIN D) 1000 units tablet Take 1,000 Units by mouth daily.    Marland Kitchen diltiazem (CARDIZEM CD) 180 MG 24 hr capsule Take 1 capsule (180 mg total) by mouth daily. 90 capsule 3  . fish oil-omega-3 fatty acids 1000 MG capsule Take 1,000 mg by mouth daily.      . hydrochlorothiazide (HYDRODIURIL) 25 MG tablet TAKE 1 TABLET BY MOUTH EVERY DAY 90 tablet 0  . Lidocaine-Glycerin (PREPARATION H RE) Place rectally as needed.    . Lidocaine-Hydrocortisone Ace 3-2.5 % KIT APPLY TO RECTUM QID FOR 2 WEEKS 1 each 0  . losartan (COZAAR) 100 MG tablet TAKE 1 TABLET BY MOUTH EVERY DAY 90 tablet 0  . pantoprazole (PROTONIX) 40 MG tablet 1 po 30 mins prior to first meal (Patient taking differently: Take 40 mg by mouth daily before breakfast. ) 30 tablet 11  .  polyethylene glycol (MIRALAX / GLYCOLAX) packet Take 17 g by mouth daily as needed.     No current facility-administered medications for this visit.    Allergies:  Patient has no known allergies.   Social History: The patient  reports that he quit smoking about 4 months ago. His smoking use included cigarettes. He has a 45.00 pack-year smoking history. He has never used smokeless tobacco. He reports that he does not drink alcohol or use drugs.   ROS:  Please see the history of present illness. Otherwise, complete review of systems is  positive for none.  All other systems are reviewed and negative.   Physical Exam: VS:  BP 140/64   Pulse (!) 56   Ht 5' 11"  (1.803 m)   Wt 226 lb (102.5 kg)   SpO2 98%   BMI 31.52 kg/m , BMI Body mass index is 31.52 kg/m.  Wt Readings from Last 3 Encounters:  07/01/17 226 lb (102.5 kg)  04/27/17 224 lb 6.4 oz (101.8 kg)  04/12/17 221 lb (100.2 kg)    General: Overweight male, appears comfortable at rest. HEENT: Conjunctiva and lids normal, oropharynx clear. Neck: Supple, no elevated JVP or carotid bruits, no thyromegaly. Lungs: Clear to auscultation, nonlabored breathing at rest. Cardiac: Regular rate and rhythm, no S3 or significant systolic murmur, no pericardial rub. Abdomen: Soft, nontender, bowel sounds present. Extremities: No pitting edema, distal pulses 2+. Skin: Warm and dry. Musculoskeletal: No kyphosis. Neuropsychiatric: Alert and oriented x3, affect grossly appropriate.  ECG: I personally reviewed the tracing from 12/03/2015 which showed sinus bradycardia with nonspecific ST-T changes.  Recent Labwork: 09/03/2016: ALT 23; AST 22; BUN 11; Creat 0.89; Potassium 4.0; Sodium 138 03/11/2017: Hemoglobin 13.1; Platelets 239     Component Value Date/Time   CHOL 137 09/03/2016 0942   TRIG 114 09/03/2016 0942   HDL 31 (L) 09/03/2016 0942   CHOLHDL 4.4 09/03/2016 0942   VLDL 23 09/03/2016 0942   LDLCALC 83 09/03/2016 0942    Other Studies Reviewed Today:  Echocardiogram 12/03/2015: Study Conclusions  - Left ventricle: The cavity size was normal. Wall thickness was   normal. Systolic function was normal. The estimated ejection   fraction was in the range of 60% to 65%. Left ventricular   diastolic function parameters were normal. - Aortic valve: Valve area (VTI): 1.74 cm^2. Valve area (Vmax):   1.81 cm^2. Valve area (Vmean): 1.58 cm^2. - Mitral valve: Calcified annulus. Mildly thickened leaflets . - Left atrium: The atrium was mildly dilated. - Atrial septum: No  defect or patent foramen ovale was identified.  Assessment and Plan:  1.  History of atrial fibrillation, no obvious recurrences over the last few years and with low CHADSVASC score of 1.  ECG shows sinus bradycardia and reports no palpitations over the last year.  Continue aspirin and Cardizem CD.  2.  Essential hypertension, continues on Cozaar with follow-up per PCP.  Current medicines were reviewed with the patient today.   Orders Placed This Encounter  Procedures  . EKG 12-Lead    Disposition: Follow-up in 1 year.  Signed, Satira Sark, MD, [facility] 07/01/2017 10:18 AM    [place] [facility] [place], [place], [place] [address] Phone: [phone]; Fax: [phone]

## 2017-07-01 ENCOUNTER — Ambulatory Visit (INDEPENDENT_AMBULATORY_CARE_PROVIDER_SITE_OTHER): Payer: Medicare HMO | Admitting: Cardiology

## 2017-07-01 ENCOUNTER — Encounter: Payer: Self-pay | Admitting: Cardiology

## 2017-07-01 VITALS — BP 140/64 | HR 56 | Ht 71.0 in | Wt 226.0 lb

## 2017-07-01 DIAGNOSIS — I10 Essential (primary) hypertension: Secondary | ICD-10-CM

## 2017-07-01 DIAGNOSIS — Z8679 Personal history of other diseases of the circulatory system: Secondary | ICD-10-CM | POA: Diagnosis not present

## 2017-07-01 NOTE — Patient Instructions (Signed)

## 2017-08-05 ENCOUNTER — Ambulatory Visit (INDEPENDENT_AMBULATORY_CARE_PROVIDER_SITE_OTHER): Payer: Medicare HMO | Admitting: Gastroenterology

## 2017-08-05 ENCOUNTER — Encounter: Payer: Self-pay | Admitting: Gastroenterology

## 2017-08-05 DIAGNOSIS — K2970 Gastritis, unspecified, without bleeding: Secondary | ICD-10-CM

## 2017-08-05 DIAGNOSIS — L290 Pruritus ani: Secondary | ICD-10-CM | POA: Diagnosis not present

## 2017-08-05 DIAGNOSIS — B9681 Helicobacter pylori [H. pylori] as the cause of diseases classified elsewhere: Secondary | ICD-10-CM

## 2017-08-05 DIAGNOSIS — D649 Anemia, unspecified: Secondary | ICD-10-CM

## 2017-08-05 NOTE — Assessment & Plan Note (Signed)
EGD FEB 2019 NO H PYLORI OR ATYPICAL CELLS.  CONTINUE TO MONITOR SYMPTOMS.

## 2017-08-05 NOTE — Assessment & Plan Note (Signed)
NO BRBPR OR MELENA. MAY HAVE PERIANAL IRRITATION. FEB 2019 Hb NL.  CONTINUE TO MONITOR SYMPTOMS.

## 2017-08-05 NOTE — Progress Notes (Signed)
cc'ed to pcp °

## 2017-08-05 NOTE — Assessment & Plan Note (Signed)
SYMPTOMS FAIRLY WELL CONTROLLED.  USE LESS MIRALAX IF HAVING 3-4 BMs AT ONCE. USE TERBINAFINE AS NEEDED. DRINK WATER TO KEEP YOUR URINE LIGHT YELLOW. EAT FIBER CONTINUE YOUR WEIGHT LOSS EFFORTS. FOLLOW UP IN 6 MOS.

## 2017-08-05 NOTE — Progress Notes (Signed)
Subjective:    Patient ID: Christopher Lang, male    DOB: 1955/12/29, 62 y.o.   MRN: [id number] Caren Macadam, MD  HPI ANUS GETS IRRITATED WHEN HE HAS MORE THAN 3-4 BMs A DAY(#3 OR 4). PUTTING TERBINAFINE WHEN USES BID FOR ONE DAY. RELIEVES ITCHING. BMs: Q2-3 DAYS. SEEMS TO NEVER GET FULL. MAY SEE BLOOD ON TISSUE IF HE WIPES TO GET CLEAN.   PT DENIES FEVER, CHILLS, HEMATOCHEZIA, HEMATEMESIS, nausea, vomiting, melena, diarrhea, CHEST PAIN, SHORTNESS OF BREATH,  CHANGE IN BOWEL IN HABITS, constipation, abdominal pain, problems swallowing, problems with sedation, OR heartburn or indigestion.  Past Medical History:  Diagnosis Date  . Cataract    had removed Dec 2017/Jan 2018  . COPD (chronic obstructive pulmonary disease) ([place])   . Essential hypertension   . H. pylori infection 11/2016   With documented eradication via biopsy Feb 2019  . History of atrial fibrillation    November 2017  . Hyperlipidemia   . Sleep apnea    COULDN'T TOLERATE CPAP  . Tubulovillous adenoma of rectum 04/2010   4 ADDITIONAL SIMPLE ADENOMAS    Past Surgical History:  Procedure Laterality Date  . CATARACT EXTRACTION W/PHACO Right 01/16/2016   Procedure: CATARACT EXTRACTION PHACO AND INTRAOCULAR LENS PLACEMENT RIGHT EYE CDE= 6.97;  Surgeon: Tonny Branch, MD;  Location: [facility];  Service: Ophthalmology;  Laterality: Right;  right  . CATARACT EXTRACTION W/PHACO Left 01/30/2016   Procedure: CATARACT EXTRACTION PHACO AND INTRAOCULAR LENS PLACEMENT LEFT EYE CDE 3.13;  Surgeon: Tonny Branch, MD;  Location: [facility];  Service: Ophthalmology;  Laterality: Left;  left  . COLONOSCOPY  2012  . COLONOSCOPY N/A 12/04/2016   three 4-6 mm tubular adenomas, moderate size external and internal hemorrhoids, redundant left colon, 3 year surveillance  . ESOPHAGOGASTRODUODENOSCOPY N/A 12/04/2016   Dr. Oneida Alar: normal esophagus, small hiatal hernia, chronic gastritis with H.pylori, peptic duodenitis, atypical lymphoid proliferation.  Surveillance EGD Feb 2019 without abnormal cells, negative H.pylori  . ESOPHAGOGASTRODUODENOSCOPY N/A 03/15/2017   mild gastritis, no atypical cells, negative H.pylori  . EXCISION, SKI  08/2016   R THIGH   No Known Allergies  Current Outpatient Medications  Medication Sig Dispense Refill  . aspirin EC 81 MG tablet Take 81 mg by mouth daily.    Marland Kitchen atorvastatin (LIPITOR) 10 MG tablet Take 1 tablet (10 mg total) by mouth daily at 6 PM. 90 tablet 3  . cholecalciferol (VITAMIN D) 1000 units tablet Take 1,000 Units by mouth daily.    Marland Kitchen diltiazem (CARDIZEM CD) 180 MG 24 hr capsule Take 1 capsule (180 mg total) by mouth daily. 90 capsule 3  . hydrochlorothiazide (HYDRODIURIL) 25 MG tablet TAKE 1 TABLET BY MOUTH EVERY DAY 90 tablet 0  . losartan (COZAAR) 100 MG tablet TAKE 1 TABLET BY MOUTH EVERY DAY 90 tablet 0  . Omega-3 Fatty Acids (FISH OIL) 1200 MG CAPS Take by mouth daily.    . pantoprazole (PROTONIX) 40 MG tablet  Take 40 mg by mouth daily before breakfast. ) 30 tablet 11  . polyethylene glycol (MIRALAX / GLYCOLAX) packet Take 17 g by mouth daily as needed.    . terbinafine (LAMISIL) 1 % cream Apply 1 application topically as needed.    .      . Lidocaine-Glycerin (PREPARATION H RE) Place rectally as needed.    Marland Kitchen    0   Review of Systems PER HPI OTHERWISE ALL SYSTEMS ARE NEGATIVE.    Objective:   Physical Exam  Constitutional:  He is oriented to person, place, and time. He appears well-developed and well-nourished. No distress.  HENT:  Head: Normocephalic and atraumatic.  Mouth/Throat: Oropharynx is clear and moist. No oropharyngeal exudate.  Eyes: Pupils are equal, round, and reactive to light. No scleral icterus.  Neck: Normal range of motion. Neck supple.  Cardiovascular: Normal rate, regular rhythm and normal heart sounds.  Pulmonary/Chest: Effort normal and breath sounds normal. No respiratory distress.  Abdominal: Soft. Bowel sounds are normal. He exhibits no distension. There  is no tenderness.  Musculoskeletal: He exhibits no edema.  Lymphadenopathy:    He has no cervical adenopathy.  Neurological: He is alert and oriented to person, place, and time.  NO FOCAL DEFICITS  Psychiatric: He has a normal mood and affect.  Vitals reviewed.     Assessment & Plan:

## 2017-08-05 NOTE — Patient Instructions (Signed)
USE LESS MIRALAX IF HAVING 3-4 BMs AT ONCE.  USE TERBINAFINE AS NEEDED.   DRINK WATER TO KEEP YOUR URINE LIGHT YELLOW.  EAT FIBER.  CONTINUE YOUR WEIGHT LOSS EFFORTS.   FOLLOW UP IN 6 MOS.

## 2017-08-06 DIAGNOSIS — K219 Gastro-esophageal reflux disease without esophagitis: Secondary | ICD-10-CM | POA: Diagnosis not present

## 2017-08-06 DIAGNOSIS — E785 Hyperlipidemia, unspecified: Secondary | ICD-10-CM | POA: Diagnosis not present

## 2017-08-06 DIAGNOSIS — G4733 Obstructive sleep apnea (adult) (pediatric): Secondary | ICD-10-CM | POA: Diagnosis not present

## 2017-08-06 DIAGNOSIS — I4891 Unspecified atrial fibrillation: Secondary | ICD-10-CM | POA: Diagnosis not present

## 2017-08-06 DIAGNOSIS — I10 Essential (primary) hypertension: Secondary | ICD-10-CM | POA: Diagnosis not present

## 2017-08-24 DIAGNOSIS — Z712 Person consulting for explanation of examination or test findings: Secondary | ICD-10-CM | POA: Diagnosis not present

## 2017-08-24 DIAGNOSIS — G4733 Obstructive sleep apnea (adult) (pediatric): Secondary | ICD-10-CM | POA: Diagnosis not present

## 2017-08-24 DIAGNOSIS — R0602 Shortness of breath: Secondary | ICD-10-CM | POA: Diagnosis not present

## 2017-09-21 ENCOUNTER — Ambulatory Visit: Payer: Medicare HMO | Admitting: Family Medicine

## 2017-09-26 ENCOUNTER — Other Ambulatory Visit: Payer: Self-pay | Admitting: Family Medicine

## 2017-10-13 DIAGNOSIS — Z23 Encounter for immunization: Secondary | ICD-10-CM | POA: Diagnosis not present

## 2017-11-18 DIAGNOSIS — G4733 Obstructive sleep apnea (adult) (pediatric): Secondary | ICD-10-CM | POA: Diagnosis not present

## 2017-11-18 DIAGNOSIS — I10 Essential (primary) hypertension: Secondary | ICD-10-CM | POA: Diagnosis not present

## 2017-11-18 DIAGNOSIS — R413 Other amnesia: Secondary | ICD-10-CM | POA: Diagnosis not present

## 2017-11-18 DIAGNOSIS — R5383 Other fatigue: Secondary | ICD-10-CM | POA: Diagnosis not present

## 2017-12-28 ENCOUNTER — Other Ambulatory Visit: Payer: Self-pay | Admitting: Family Medicine

## 2017-12-31 ENCOUNTER — Encounter: Payer: Self-pay | Admitting: Gastroenterology

## 2018-01-10 DIAGNOSIS — R413 Other amnesia: Secondary | ICD-10-CM | POA: Diagnosis not present

## 2018-01-10 DIAGNOSIS — G4733 Obstructive sleep apnea (adult) (pediatric): Secondary | ICD-10-CM | POA: Diagnosis not present

## 2018-01-10 DIAGNOSIS — I10 Essential (primary) hypertension: Secondary | ICD-10-CM | POA: Diagnosis not present

## 2018-01-10 DIAGNOSIS — R5383 Other fatigue: Secondary | ICD-10-CM | POA: Diagnosis not present

## 2018-05-14 IMAGING — DX DG CHEST 2V
2 series · 2 of 2 positions shown · non-contrast
Comparison: None.

CLINICAL DATA: Atrial fibrillation

EXAM:
CHEST  2 VIEW

[chest pa]
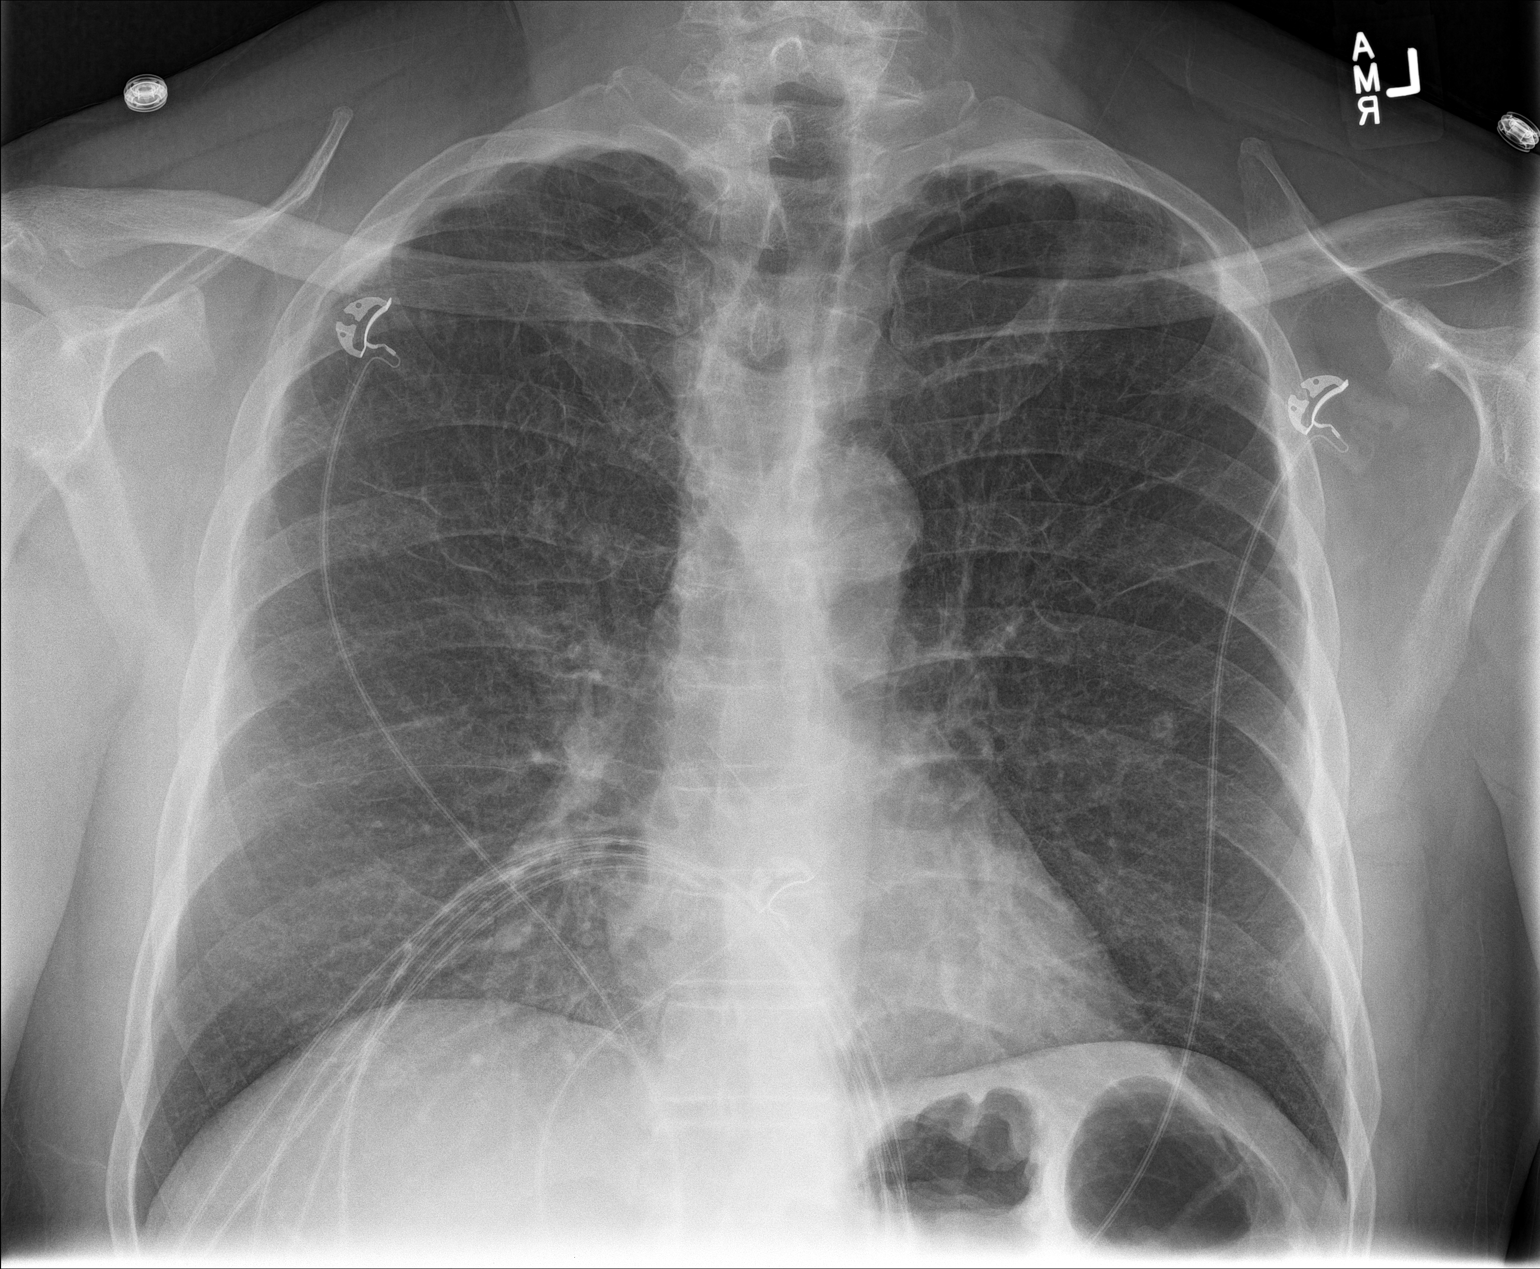

[chest lat]
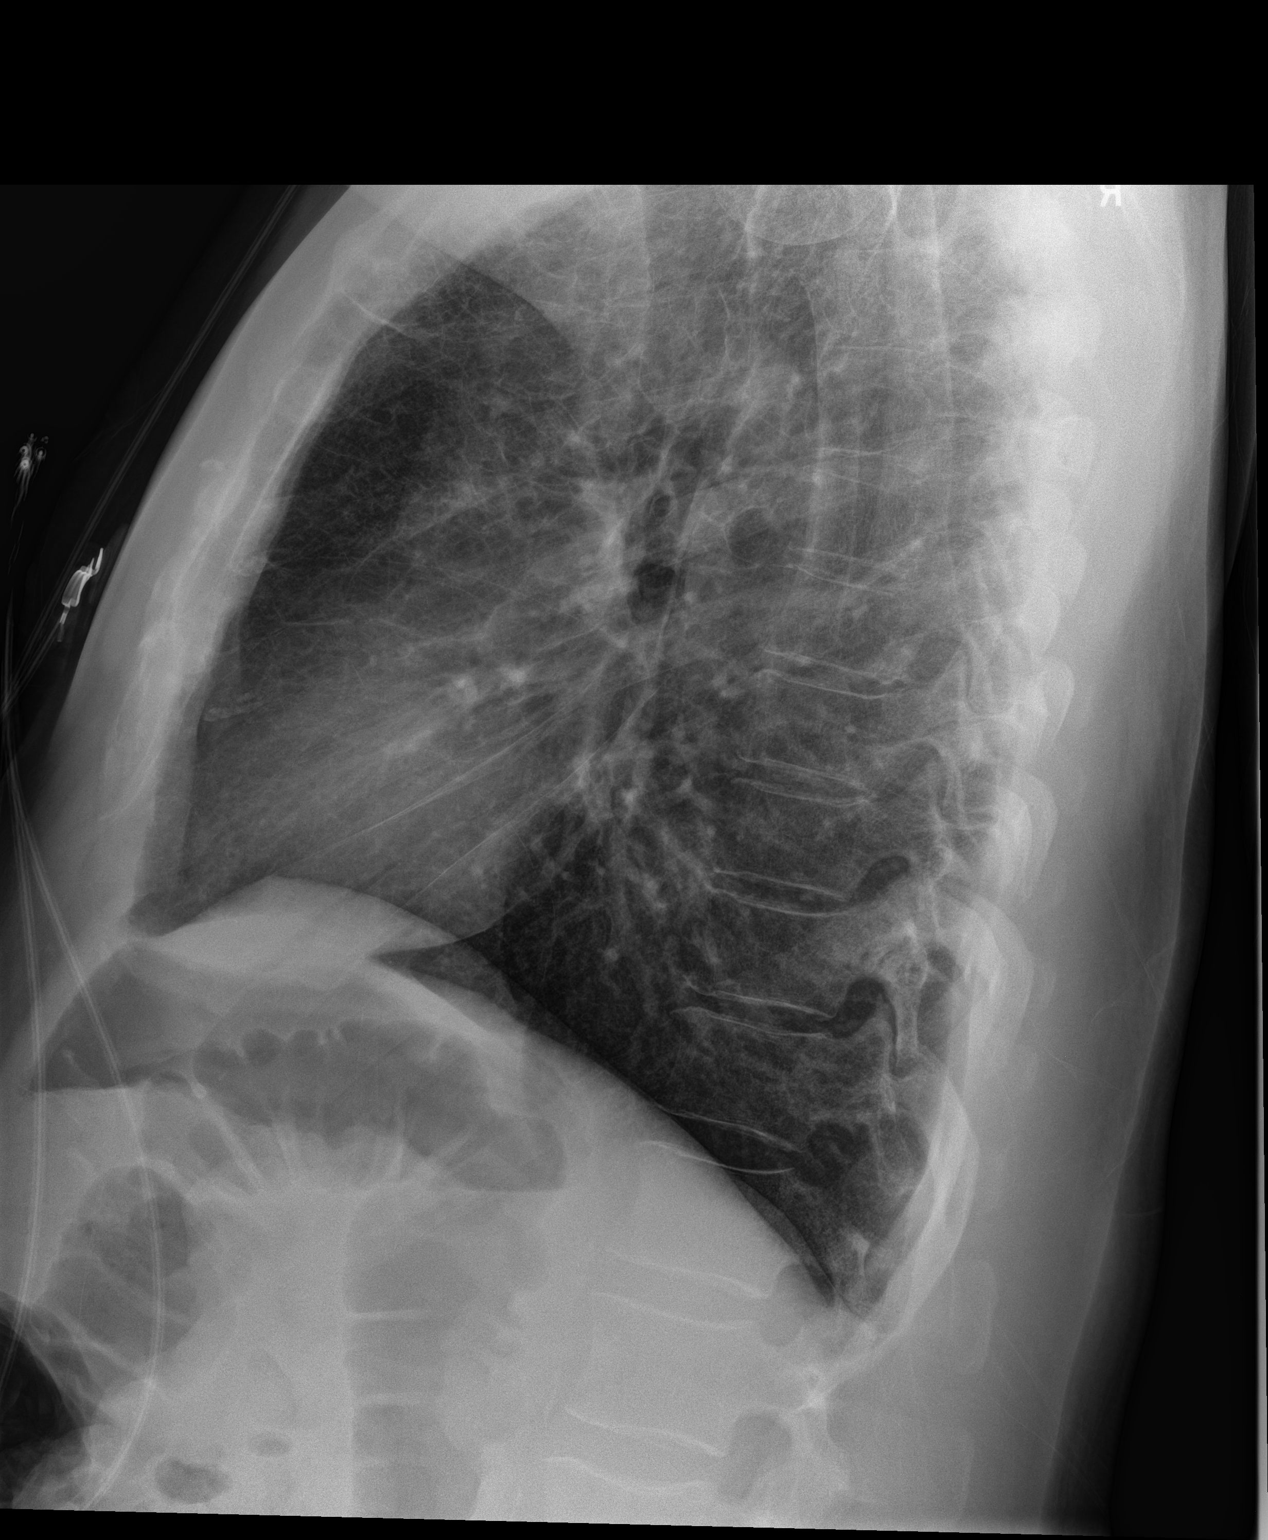

[2 of 2 positions shown; findings below may reference images not displayed]

FINDINGS: There is scarring in both upper lobes. There is no edema or
consolidation. The heart size and pulmonary vascularity are normal.
No adenopathy. There is atherosclerotic calcification in the aorta.
There are no evident bone lesions.
IMPRESSION: Scarring in both upper lobe regions.  No edema or consolidation.

## 2018-06-23 ENCOUNTER — Ambulatory Visit (INDEPENDENT_AMBULATORY_CARE_PROVIDER_SITE_OTHER): Payer: Medicare HMO | Admitting: Family Medicine

## 2018-06-23 ENCOUNTER — Encounter: Payer: Self-pay | Admitting: Family Medicine

## 2018-06-23 ENCOUNTER — Other Ambulatory Visit: Payer: Self-pay

## 2018-06-23 VITALS — BP 154/69 | HR 72 | Temp 97.8°F | Ht 72.0 in | Wt 211.6 lb

## 2018-06-23 DIAGNOSIS — I480 Paroxysmal atrial fibrillation: Secondary | ICD-10-CM

## 2018-06-23 DIAGNOSIS — J449 Chronic obstructive pulmonary disease, unspecified: Secondary | ICD-10-CM | POA: Diagnosis not present

## 2018-06-23 DIAGNOSIS — I10 Essential (primary) hypertension: Secondary | ICD-10-CM

## 2018-06-23 DIAGNOSIS — E782 Mixed hyperlipidemia: Secondary | ICD-10-CM | POA: Diagnosis not present

## 2018-06-23 DIAGNOSIS — R7309 Other abnormal glucose: Secondary | ICD-10-CM

## 2018-06-23 MED ORDER — NICOTINE 21 MG/24HR TD PT24: 21.0000 mg | MEDICATED_PATCH | Freq: Every day | TRANSDERMAL | 0 refills | Status: DC

## 2018-06-23 MED ORDER — DILTIAZEM HCL ER COATED BEADS 180 MG PO CP24: 180.0000 mg | ORAL_CAPSULE | Freq: Every day | ORAL | 3 refills | Status: DC

## 2018-06-23 MED ORDER — ATORVASTATIN CALCIUM 10 MG PO TABS: 10.0000 mg | ORAL_TABLET | Freq: Every day | ORAL | 3 refills | Status: DC

## 2018-06-23 MED ORDER — LOSARTAN POTASSIUM-HCTZ 100-25 MG PO TABS: 1.0000 | ORAL_TABLET | Freq: Every day | ORAL | 3 refills | Status: DC

## 2018-06-23 NOTE — Progress Notes (Signed)
BP (!) 154/69   Pulse 72   Temp 97.8 F (36.6 C) (Oral)   Ht 6' (1.829 m)   Wt 211 lb 9.6 oz (96 kg)   BMI 28.70 kg/m    Subjective:    Patient ID: Christopher Lang, male    DOB: [birth year], 63 y.o.   MRN: [id number]  HPI: Christopher Lang is a 63 y.o. male presenting on 06/23/2018 for New Patient (Initial Visit) ([facility] in [place] ) and Establish Care   HPI Hypertension Patient is currently on diltiazem and losartan-hydrochlorothiazide, and their blood pressure today is 154/69. Patient denies any lightheadedness or dizziness. Patient denies headaches, blurred vision, chest pains, shortness of breath, or weakness. Denies any side effects from medication and is content with current medication.   Hyperlipidemia Patient is coming in for recheck of his hyperlipidemia. The patient is currently taking omega-3 and Lipitor. They deny any issues with myalgias or history of liver damage from it. They deny any focal numbness or weakness or chest pain.   COPD Patient is coming in for COPD recheck today.  He is currently on no medication currently.  He has a mild chronic cough but denies any major coughing spells or wheezing spells.  He has 0nighttime symptoms per week and 0daytime symptoms per week currently.   A. Fib Patient is diagnosed with A. fib and has a cardiologist.  They have not had him on anticoagulation but he is on the Cardizem.  He has been following with them consistently.  Relevant past medical, surgical, family and social history reviewed and updated as indicated. Interim medical history since our last visit reviewed. Allergies and medications reviewed and updated.  Review of Systems  Constitutional: Negative for chills and fever.  HENT: Negative for ear pain and tinnitus.   Eyes: Negative for pain and discharge.  Respiratory: Negative for cough, shortness of breath and wheezing.   Cardiovascular: Negative for chest pain, palpitations and leg swelling.   Gastrointestinal: Negative for abdominal pain, blood in stool, constipation and diarrhea.  Genitourinary: Negative for dysuria and hematuria.  Musculoskeletal: Negative for back pain, gait problem and myalgias.  Skin: Negative for rash.  Neurological: Negative for dizziness, weakness and headaches.  Psychiatric/Behavioral: Negative for suicidal ideas.  All other systems reviewed and are negative.   Per HPI unless specifically indicated above  Social History   Socioeconomic History  . Marital status: Married    Spouse name: [name]  . Number of children: 2  . Years of education: GED  . Highest education level: Not on file  Occupational History  . 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 Financial resource strain: Not on file  . Food insecurity:    Worry: Not on file    Inability: Not on file  . Transportation needs:    Medical: Not on file    Non-medical: Not on file  Tobacco Use  . Smoking status: Former Smoker    Packs/day: 1.00    Years: 45.00    Pack years: 45.00    Types: Cigarettes    Last attempt to quit: 02/28/2017    Years since quitting: 1.3  . Smokeless tobacco: Never Used  Substance and Sexual Activity  . Alcohol use: No    Alcohol/week: 0.0 standard drinks    Comment: Quit in November  . Drug use: No  . Sexual activity: Yes    Birth control/protection: Post-menopausal  Lifestyle  . Physical activity:  Days per week: Not on file    Minutes per session: Not on file  . Stress: Not on file  Relationships  . Social connections:    Talks on phone: Not on file    Gets together: Not on file    Attends religious service: Not on file    Active member of club or organization: Not on file    Attends meetings of clubs or organizations: Not on file    Relationship status: Not on file  . Intimate partner violence:    Fear of current or ex partner: Not on file    Emotionally abused: Not on file    Physically abused: Not on file     Forced sexual activity: Not on file  Other Topics Concern  . Not on file  Social History Narrative   Lives with wife [name]   Leisure - couch potato    Past Surgical History:  Procedure Laterality Date  . CATARACT EXTRACTION W/PHACO Right 01/16/2016   Procedure: CATARACT EXTRACTION PHACO AND INTRAOCULAR LENS PLACEMENT RIGHT EYE CDE= 6.97;  Surgeon: Tonny Branch, MD;  Location: [facility];  Service: Ophthalmology;  Laterality: Right;  right  . CATARACT EXTRACTION W/PHACO Left 01/30/2016   Procedure: CATARACT EXTRACTION PHACO AND INTRAOCULAR LENS PLACEMENT LEFT EYE CDE 3.13;  Surgeon: Tonny Branch, MD;  Location: [facility];  Service: Ophthalmology;  Laterality: Left;  left  . COLONOSCOPY  2012  . COLONOSCOPY N/A 12/04/2016   three 4-6 mm tubular adenomas, moderate size external and internal hemorrhoids, redundant left colon, 3 year surveillance  . ESOPHAGOGASTRODUODENOSCOPY N/A 12/04/2016   Dr. Oneida Alar: normal esophagus, small hiatal hernia, chronic gastritis with H.pylori, peptic duodenitis, atypical lymphoid proliferation. Surveillance EGD Feb 2019 without abnormal cells, negative H.pylori  . ESOPHAGOGASTRODUODENOSCOPY N/A 03/15/2017   mild gastritis, no atypical cells, negative H.pylori  . EXCISION, SKI  08/2016   R THIGH    Family History  Problem Relation Age of Onset  . CAD Mother   . High blood pressure Mother   . Heart attack Mother   . Leukemia Father   . Early death Father   . Heart attack Sister   . Diabetes Sister   . Cancer Brother   . Deep vein thrombosis Brother   . Hypertension Daughter   . Stroke Sister   . Stroke Sister   . Heart attack Sister   . Deep vein thrombosis Sister   . Hypertension Sister   . Diabetes Sister   . Arthritis Sister   . Arthritis Sister   . Arthritis Sister   . Hypertension Sister   . Heart attack Sister   . Cancer Brother        KIDNEY  . Kidney disease Brother   . Deep vein thrombosis Brother   . Seizures Brother   . Heart attack  Brother   . Colon cancer Neg Hx   . Colon polyps Neg Hx     Allergies as of 06/23/2018   No Known Allergies     Medication List       Accurate as of June 23, 2018  2:38 PM. If you have any questions, ask your nurse or doctor.        STOP taking these medications   hydrochlorothiazide 25 MG tablet Commonly known as:  HYDRODIURIL Stopped by:  Worthy Rancher, MD   Lidocaine-Hydrocortisone Ace 3-2.5 % Kit Stopped by:  Fransisca Kaufmann Karter Haire, MD   losartan 100 MG tablet Commonly known as:  COZAAR Stopped  by:  Worthy Rancher, MD   polyethylene glycol 17 g packet Commonly known as:  MIRALAX / GLYCOLAX Stopped by:  Worthy Rancher, MD   PREPARATION H RE Stopped by:  Fransisca Kaufmann Cintia Gleed, MD   terbinafine 1 % cream Commonly known as:  LAMISIL Stopped by:  Worthy Rancher, MD     TAKE these medications   aspirin EC 81 MG tablet Take 81 mg by mouth daily.   atorvastatin 10 MG tablet Commonly known as:  LIPITOR Take 1 tablet (10 mg total) by mouth daily at 6 PM.   cholecalciferol 1000 units tablet Commonly known as:  VITAMIN D Take 1,000 Units by mouth daily.   diltiazem 180 MG 24 hr capsule Commonly known as:  CARDIZEM CD Take 1 capsule (180 mg total) by mouth daily.   Fish Oil 1200 MG Caps Take by mouth daily.   fish oil-omega-3 fatty acids 1000 MG capsule Take 1,000 mg by mouth daily.   losartan-hydrochlorothiazide 100-25 MG tablet Commonly known as:  HYZAAR Take 1 tablet by mouth daily. Started by:  Worthy Rancher, MD   nicotine 21 mg/24hr patch Commonly known as:  NICODERM CQ - dosed in mg/24 hours Place 1 patch (21 mg total) onto the skin daily. Started by:  Worthy Rancher, MD   pantoprazole 40 MG tablet Commonly known as:  Protonix 1 po 30 mins prior to first meal What changed:    how much to take  how to take this  when to take this  additional instructions          Objective:    BP (!) 154/69   Pulse 72   Temp 97.8  F (36.6 C) (Oral)   Ht 6' (1.829 m)   Wt 211 lb 9.6 oz (96 kg)   BMI 28.70 kg/m   Wt Readings from Last 3 Encounters:  06/23/18 211 lb 9.6 oz (96 kg)  08/05/17 226 lb 3.2 oz (102.6 kg)  07/01/17 226 lb (102.5 kg)    Physical Exam Vitals signs and nursing note reviewed.  Constitutional:      General: He is not in acute distress.    Appearance: He is well-developed. He is not diaphoretic.  Eyes:     General: No scleral icterus.    Conjunctiva/sclera: Conjunctivae normal.  Neck:     Musculoskeletal: Neck supple.     Thyroid: No thyromegaly.  Cardiovascular:     Rate and Rhythm: Normal rate and regular rhythm.     Heart sounds: Normal heart sounds, S1 normal and S2 normal. No murmur.  Pulmonary:     Effort: Pulmonary effort is normal. No respiratory distress.     Breath sounds: Normal breath sounds. No wheezing.  Musculoskeletal: Normal range of motion.  Lymphadenopathy:     Cervical: No cervical adenopathy.  Skin:    General: Skin is warm and dry.     Findings: No rash.  Neurological:     Mental Status: He is alert and oriented to person, place, and time.     Coordination: Coordination normal.  Psychiatric:        Behavior: Behavior normal.         Assessment & Plan:   Problem List Items Addressed This Visit      Cardiovascular and Mediastinum   Atrial fibrillation (HCC)   Relevant Medications   losartan-hydrochlorothiazide (HYZAAR) 100-25 MG tablet   atorvastatin (LIPITOR) 10 MG tablet   diltiazem (CARDIZEM CD) 180 MG 24 hr capsule  Other Relevant Orders   CBC with Differential/Platelet (Completed)   Essential hypertension - Primary   Relevant Medications   losartan-hydrochlorothiazide (HYZAAR) 100-25 MG tablet   atorvastatin (LIPITOR) 10 MG tablet   diltiazem (CARDIZEM CD) 180 MG 24 hr capsule   Other Relevant Orders   CMP14+EGFR (Completed)     Respiratory   COPD (chronic obstructive pulmonary disease) (HCC)   Relevant Medications   nicotine  (NICODERM CQ - DOSED IN MG/24 HOURS) 21 mg/24hr patch   Other Relevant Orders   CBC with Differential/Platelet (Completed)     Other   Hyperlipidemia   Relevant Medications   losartan-hydrochlorothiazide (HYZAAR) 100-25 MG tablet   atorvastatin (LIPITOR) 10 MG tablet   diltiazem (CARDIZEM CD) 180 MG 24 hr capsule   Other Relevant Orders   Lipid panel (Completed)      As a new patient to our clinic we have made some combinations of his medications but essentially keeping him on the same medications at the same doses that he had been on.  Patient does want to do nicotine cessation and we will give him NicoDerm patches. Follow up plan: Return in about 6 months (around 12/23/2018), or if symptoms worsen or fail to improve, for htn.  Caryl Pina, MD [facility] 06/23/2018, 2:38 PM

## 2018-06-24 LAB — CBC WITH DIFFERENTIAL/PLATELET
Basophils Absolute: 0 10*3/uL (ref 0.0–0.2)
Basos: 0 %
EOS (ABSOLUTE): 0.1 10*3/uL (ref 0.0–0.4)
Eos: 1 %
Hematocrit: 38.5 % (ref 37.5–51.0)
Hemoglobin: 14.1 g/dL (ref 13.0–17.7)
Immature Grans (Abs): 0 10*3/uL (ref 0.0–0.1)
Immature Granulocytes: 0 %
Lymphocytes Absolute: 2.9 10*3/uL (ref 0.7–3.1)
Lymphs: 38 %
MCH: 34.3 pg — ABNORMAL HIGH (ref 26.6–33.0)
MCHC: 36.6 g/dL — ABNORMAL HIGH (ref 31.5–35.7)
MCV: 94 fL (ref 79–97)
Monocytes Absolute: 0.6 10*3/uL (ref 0.1–0.9)
Monocytes: 8 %
Neutrophils Absolute: 4 10*3/uL (ref 1.4–7.0)
Neutrophils: 53 %
Platelets: 240 10*3/uL (ref 150–450)
RBC: 4.11 x10E6/uL — ABNORMAL LOW (ref 4.14–5.80)
RDW: 11.9 % (ref 11.6–15.4)
WBC: 7.7 10*3/uL (ref 3.4–10.8)

## 2018-06-24 LAB — CMP14+EGFR
ALT: 28 IU/L (ref 0–44)
AST: 35 IU/L (ref 0–40)
Albumin/Globulin Ratio: 1.9 (ref 1.2–2.2)
Albumin: 4.4 g/dL (ref 3.8–4.8)
Alkaline Phosphatase: 109 IU/L (ref 39–117)
BUN/Creatinine Ratio: 13 (ref 10–24)
BUN: 12 mg/dL (ref 8–27)
Bilirubin Total: 0.4 mg/dL (ref 0.0–1.2)
CO2: 23 mmol/L (ref 20–29)
Calcium: 10.5 mg/dL — ABNORMAL HIGH (ref 8.6–10.2)
Chloride: 101 mmol/L (ref 96–106)
Creatinine, Ser: 0.96 mg/dL (ref 0.76–1.27)
GFR calc Af Amer: 95 mL/min/{1.73_m2} (ref >59)
GFR calc non Af Amer: 83 mL/min/{1.73_m2} (ref >59)
Globulin, Total: 2.3 g/dL (ref 1.5–4.5)
Glucose: 117 mg/dL — ABNORMAL HIGH (ref 65–99)
Potassium: 3.8 mmol/L (ref 3.5–5.2)
Sodium: 139 mmol/L (ref 134–144)
Total Protein: 6.7 g/dL (ref 6.0–8.5)

## 2018-06-24 LAB — LIPID PANEL
Chol/HDL Ratio: 5.5 ratio — ABNORMAL HIGH (ref 0.0–5.0)
Cholesterol, Total: 160 mg/dL (ref 100–199)
HDL: 29 mg/dL — ABNORMAL LOW (ref >39)
LDL Calculated: 91 mg/dL (ref 0–99)
Triglycerides: 200 mg/dL — ABNORMAL HIGH (ref 0–149)
VLDL Cholesterol Cal: 40 mg/dL (ref 5–40)

## 2018-06-27 LAB — SPECIMEN STATUS REPORT

## 2018-06-27 LAB — HGB A1C W/O EAG: Hgb A1c MFr Bld: 5.5 % (ref 4.8–5.6)

## 2018-07-06 ENCOUNTER — Ambulatory Visit: Payer: Medicare HMO | Admitting: Cardiology

## 2018-09-07 ENCOUNTER — Telehealth: Payer: Self-pay | Admitting: Cardiology

## 2018-09-07 NOTE — Telephone Encounter (Signed)
Virtual Visit Pre-Appointment Phone Call  "(Name), I am calling you today to discuss your upcoming appointment. We are currently trying to limit exposure to the virus that causes COVID-19 by seeing patients at home rather than in the office."  1. "What is the BEST phone number to call the day of the visit?" - include this in appointment notes  2. Do you have or have access to (through a family member/friend) a smartphone with video capability that we can use for your visit?" a. If yes - list this number in appt notes as cell (if different from BEST phone #) and list the appointment type as a VIDEO visit in appointment notes b. If no - list the appointment type as a PHONE visit in appointment notes  Confirm consent - "In the setting of the current Covid19 crisis, you are scheduled for a (phone or video) visit with your provider on (date) at (time).  Just as we do with many in-office visits, in order for you to participate in this visit, we must obtain consent.  If you'd like, I can send this to your mychart (if signed up) or email for you to review.  Otherwise, I can obtain your verbal consent now.  All virtual visits are billed to your insurance company just like a normal visit would be.  By agreeing to a virtual visit, we'd like you to understand that the technology does not allow for your provider to perform an examination, and thus may limit your provider's ability to fully assess your condition. If your provider identifies any concerns that need to be evaluated in person, we will make arrangements to do so.  Finally, though the technology is pretty good, we cannot assure that it will always work on either your or our end, and in the setting of a video visit, we may have to convert it to a phone-only visit.  In either situation, we cannot ensure that we have a secure connection.  Are you willing to proceed?" STAFF: Did the patient verbally acknowledge consent to telehealth visit? Document  YES/NO here: yes 3. Advise patient to be prepared - "Two hours prior to your appointment, go ahead and check your blood pressure, pulse, oxygen saturation, and your weight (if you have the equipment to check those) and write them all down. When your visit starts, your provider will ask you for this information. If you have an Apple Watch or Kardia device, please plan to have heart rate information ready on the day of your appointment. Please have a pen and paper handy nearby the day of the visit as well."  4. Give patient instructions for MyChart download to smartphone OR Doximity/[email or web address] as below if video visit (depending on what platform provider is using)  5. Inform patient they will receive a phone call 15 minutes prior to their appointment time (may be from unknown caller ID) so they should be prepared to answer    TELEPHONE CALL NOTE  KYDAN SHANHOLTZER has been deemed a candidate for a follow-up tele-health visit to limit community exposure during the Covid-19 pandemic. I spoke with the patient via phone to ensure availability of phone/video source, confirm preferred email & phone number, and discuss instructions and expectations.  I reminded Christopher Lang to be prepared with any vital sign and/or heart rhythm information that could potentially be obtained via home monitoring, at the time of his visit. I reminded Christopher Lang to expect a phone call prior to his visit.  Christopher Lang 09/07/2018 1:54 PM   INSTRUCTIONS FOR DOWNLOADING THE MYCHART APP TO SMARTPHONE  - The patient must first make sure to have activated MyChart and know their login information - If Apple, go to [employer] and type in MyChart in the search bar and download the app. If Android, ask patient to go to [employer] and type in [place] in the search bar and download the app. The app is free but as with any other app downloads, their phone may require them to verify saved payment information or Apple/Android  password.  - The patient will need to then log into the app with their MyChart username and password, and select  as their healthcare provider to link the account. When it is time for your visit, go to the MyChart app, find appointments, and click Begin Video Visit. Be sure to Select Allow for your device to access the Microphone and Camera for your visit. You will then be connected, and your provider will be with you shortly.  **If they have any issues connecting, or need assistance please contact MyChart service desk [phone])**  **If using a computer, in order to ensure the best quality for their visit they will need to use either of the following Internet Browsers: [employer], or Google Chrome**  IF USING DOXIMITY or DOXY.ME - The patient will receive a link just prior to their visit by text.     FULL LENGTH CONSENT FOR TELE-HEALTH VISIT   I hereby voluntarily request, consent and authorize [facility] and its employed or contracted physicians, physician assistants, nurse practitioners or other licensed health care professionals (the Practitioner), to provide me with telemedicine health care services (the Services") as deemed necessary by the treating Practitioner. I acknowledge and consent to receive the Services by the Practitioner via telemedicine. I understand that the telemedicine visit will involve communicating with the Practitioner through live audiovisual communication technology and the disclosure of certain medical information by electronic transmission. I acknowledge that I have been given the opportunity to request an in-person assessment or other available alternative prior to the telemedicine visit and am voluntarily participating in the telemedicine visit.  I understand that I have the right to withhold or withdraw my consent to the use of telemedicine in the course of my care at any time, without affecting my right to future care or treatment,  and that the Practitioner or I may terminate the telemedicine visit at any time. I understand that I have the right to inspect all information obtained and/or recorded in the course of the telemedicine visit and may receive copies of available information for a reasonable fee.  I understand that some of the potential risks of receiving the Services via telemedicine include:   Delay or interruption in medical evaluation due to technological equipment failure or disruption;  Information transmitted may not be sufficient (e.g. poor resolution of images) to allow for appropriate medical decision making by the Practitioner; and/or   In rare instances, security protocols could fail, causing a breach of personal health information.  Furthermore, I acknowledge that it is my responsibility to provide information about my medical history, conditions and care that is complete and accurate to the best of my ability. I acknowledge that Practitioner's advice, recommendations, and/or decision may be based on factors not within their control, such as incomplete or inaccurate data provided by me or distortions of diagnostic images or specimens that may result from electronic transmissions. I understand that the  practice of medicine is not an Chief Strategy Officer and that Practitioner makes no warranties or guarantees regarding treatment outcomes. I acknowledge that I will receive a copy of this consent concurrently upon execution via email to the email address I last provided but may also request a printed copy by calling the office of [facility].    I understand that my insurance will be billed for this visit.   I have read or had this consent read to me.  I understand the contents of this consent, which adequately explains the benefits and risks of the Services being provided via telemedicine.   I have been provided ample opportunity to ask questions regarding this consent and the Services and have had my questions  answered to my satisfaction.  I give my informed consent for the services to be provided through the use of telemedicine in my medical care  By participating in this telemedicine visit I agree to the above.

## 2018-09-16 ENCOUNTER — Telehealth: Payer: Medicare HMO | Admitting: Cardiology

## 2018-10-03 ENCOUNTER — Encounter: Payer: Self-pay | Admitting: Cardiology

## 2018-10-03 ENCOUNTER — Telehealth (INDEPENDENT_AMBULATORY_CARE_PROVIDER_SITE_OTHER): Payer: Medicare HMO | Admitting: Cardiology

## 2018-10-03 VITALS — BP 144/69 | HR 72 | Ht 72.0 in | Wt 210.0 lb

## 2018-10-03 DIAGNOSIS — E782 Mixed hyperlipidemia: Secondary | ICD-10-CM | POA: Diagnosis not present

## 2018-10-03 DIAGNOSIS — Z8679 Personal history of other diseases of the circulatory system: Secondary | ICD-10-CM | POA: Diagnosis not present

## 2018-10-03 DIAGNOSIS — I10 Essential (primary) hypertension: Secondary | ICD-10-CM | POA: Diagnosis not present

## 2018-10-03 NOTE — Progress Notes (Signed)
Virtual Visit via Telephone Note   This visit type was conducted due to national recommendations for restrictions regarding the COVID-19 Pandemic (e.g. social distancing) in an effort to limit this patient's exposure and mitigate transmission in our community.  Due to his co-morbid illnesses, this patient is at least at moderate risk for complications without adequate follow up.  This format is felt to be most appropriate for this patient at this time.  The patient did not have access to video technology/had technical difficulties with video requiring transitioning to audio format only (telephone).  All issues noted in this document were discussed and addressed.  No physical exam could be performed with this format.  Please refer to the patient's chart for his  consent to telehealth for [facility].   Date:  10/03/2018   ID:  Christopher Lang, DOB 05/09/1955, MRN [id number]  Patient Location: Home Provider Location: Office  PCP:  Dettinger, Fransisca Kaufmann, MD  Cardiologist:  Rozann Lesches, MD Electrophysiologist:  None   Evaluation Performed:  Follow-Up Visit  Chief Complaint:   Cardiac follow-up  History of Present Illness:    Christopher Lang is a 63 y.o. male last seen in June 2019.  We spoke by phone today.  He tells me that he has been doing well overall.  No exertional chest pain or unusual shortness of breath with typical activities, no palpitations or syncope.  CHADSVASC score is 3 at this point, although he has not had any obvious recurrent atrial fibrillation since episode back in 2017.  Last ECG is reviewed below.  He continues on aspirin and Cardizem CD.  We will continue with observation.  I reviewed his most recent lab work which is outlined below.  The patient does not have symptoms concerning for COVID-19 infection (fever, chills, cough, or new shortness of breath).  He states that he wears a mask when he goes out.   Past Medical History:  Diagnosis Date  . Cataract    Removed Dec 2017/Jan 2018  . COPD (chronic obstructive pulmonary disease) ([place])   . Essential hypertension   . H. pylori infection 11/2016   With documented eradication via biopsy Feb 2019  . History of atrial fibrillation    November 2017  . Hyperlipidemia   . Sleep apnea    Did not tolerate CPAP  . Tubulovillous adenoma of rectum 04/2010   Past Surgical History:  Procedure Laterality Date  . CATARACT EXTRACTION W/PHACO Right 01/16/2016   Procedure: CATARACT EXTRACTION PHACO AND INTRAOCULAR LENS PLACEMENT RIGHT EYE CDE= 6.97;  Surgeon: Tonny Branch, MD;  Location: [facility];  Service: Ophthalmology;  Laterality: Right;  right  . CATARACT EXTRACTION W/PHACO Left 01/30/2016   Procedure: CATARACT EXTRACTION PHACO AND INTRAOCULAR LENS PLACEMENT LEFT EYE CDE 3.13;  Surgeon: Tonny Branch, MD;  Location: [facility];  Service: Ophthalmology;  Laterality: Left;  left  . COLONOSCOPY  2012  . COLONOSCOPY N/A 12/04/2016   three 4-6 mm tubular adenomas, moderate size external and internal hemorrhoids, redundant left colon, 3 year surveillance  . ESOPHAGOGASTRODUODENOSCOPY N/A 12/04/2016   Dr. Oneida Alar: normal esophagus, small hiatal hernia, chronic gastritis with H.pylori, peptic duodenitis, atypical lymphoid proliferation. Surveillance EGD Feb 2019 without abnormal cells, negative H.pylori  . ESOPHAGOGASTRODUODENOSCOPY N/A 03/15/2017   mild gastritis, no atypical cells, negative H.pylori  . EXCISION, SKI  08/2016   R THIGH     Current Meds  Medication Sig  . aspirin EC 81 MG tablet Take 81 mg by mouth daily.  Marland Kitchen  atorvastatin (LIPITOR) 10 MG tablet Take 1 tablet (10 mg total) by mouth daily at 6 PM.  . cholecalciferol (VITAMIN D) 1000 units tablet Take 1,000 Units by mouth daily.  Marland Kitchen diltiazem (CARDIZEM CD) 180 MG 24 hr capsule Take 1 capsule (180 mg total) by mouth daily.  . fish oil-omega-3 fatty acids 1000 MG capsule Take 1,000 mg by mouth daily.    . hydrochlorothiazide (HYDRODIURIL) 25 MG tablet Take 25  mg by mouth daily.  Marland Kitchen losartan (COZAAR) 100 MG tablet Take 100 mg by mouth daily.  . pantoprazole (PROTONIX) 40 MG tablet 1 po 30 mins prior to first meal (Patient taking differently: Take 40 mg by mouth daily before breakfast. )     Allergies:   Patient has no known allergies.   Social History   Tobacco Use  . Smoking status: Former Smoker    Packs/day: 1.00    Years: 45.00    Pack years: 45.00    Types: Cigarettes    Quit date: 02/28/2017    Years since quitting: 1.5  . Smokeless tobacco: Never Used  . Tobacco comment: started back - pack per day  Substance Use Topics  . Alcohol use: No    Alcohol/week: 0.0 standard drinks    Comment: Quit in November  . Drug use: No     Family Hx: The patient's family history includes Arthritis in his sister, sister, and sister; CAD in his mother; Cancer in his brother and brother; Deep vein thrombosis in his brother, brother, and sister; Diabetes in his sister and sister; Early death in his father; Heart attack in his brother, mother, sister, sister, and sister; High blood pressure in his mother; Hypertension in his daughter, sister, and sister; Kidney disease in his brother; Leukemia in his father; Seizures in his brother; Stroke in his sister and sister. There is no history of Colon cancer or Colon polyps.  ROS:   Please see the history of present illness. All other systems reviewed and are negative.   Prior CV studies:   The following studies were reviewed today:  Echocardiogram 12/03/2015: Study Conclusions  - Left ventricle: The cavity size was normal. Wall thickness was   normal. Systolic function was normal. The estimated ejection   fraction was in the range of 60% to 65%. Left ventricular   diastolic function parameters were normal. - Aortic valve: Valve area (VTI): 1.74 cm^2. Valve area (Vmax):   1.81 cm^2. Valve area (Vmean): 1.58 cm^2. - Mitral valve: Calcified annulus. Mildly thickened leaflets . - Left atrium: The  atrium was mildly dilated. - Atrial septum: No defect or patent foramen ovale was identified.  Labs/Other Tests and Data Reviewed:    EKG:  An ECG dated 07/01/2017 was personally reviewed today and demonstrated:  Sinus bradycardia.  Recent Labs: 06/23/2018: ALT 28; BUN 12; Creatinine, Ser 0.96; Hemoglobin 14.1; Platelets 240; Potassium 3.8; Sodium 139   Recent Lipid Panel Lab Results  Component Value Date/Time   CHOL 160 06/23/2018 02:45 PM   TRIG 200 (H) 06/23/2018 02:45 PM   HDL 29 (L) 06/23/2018 02:45 PM   CHOLHDL 5.5 (H) 06/23/2018 02:45 PM   CHOLHDL 4.4 09/03/2016 09:42 AM   LDLCALC 91 06/23/2018 02:45 PM    Wt Readings from Last 3 Encounters:  10/03/18 210 lb (95.3 kg)  06/23/18 211 lb 9.6 oz (96 kg)  08/05/17 226 lb 3.2 oz (102.6 kg)     Objective:    Vital Signs:  BP (!) 144/69   Pulse 72  Ht 6' (1.829 m)   Wt 210 lb (95.3 kg)   BMI 28.48 kg/m    Patient spoke in full sentences, not short of breath. No audible wheezing or coughing. Speech pattern normal.  ASSESSMENT & PLAN:    1.  History of transient atrial fibrillation back in 2017.  He does not report any palpitations and continues on aspirin along with Cardizem CD.  CHADSVASC score is 3, I have talked with him about being observant for any symptoms that would prompt further evaluation as he would be a candidate for anticoagulation with recurring atrial fibrillation.  2.  Essential hypertension, systolic is in the 0000000 today.  He is on losartan, HCTZ, and Cardizem CD.  Keep follow-up with PCP.  3.  Mixed hyperlipidemia on Lipitor.  LDL 91.  COVID-19 Education: The signs and symptoms of COVID-19 were discussed with the patient and how to seek care for testing (follow up with PCP or arrange E-visit).  The importance of social distancing was discussed today.  Time:   Today, I have spent 5 minutes with the patient with telehealth technology discussing the above problems.     Medication Adjustments/Labs and  Tests Ordered: Current medicines are reviewed at length with the patient today.  Concerns regarding medicines are outlined above.   Tests Ordered: No orders of the defined types were placed in this encounter.   Medication Changes: No orders of the defined types were placed in this encounter.   Follow Up:  In Person 1 year in the [place] office.  Signed, Rozann Lesches, MD  10/03/2018 8:57 AM    [place]

## 2018-10-03 NOTE — Patient Instructions (Signed)

## 2018-10-05 ENCOUNTER — Other Ambulatory Visit: Payer: Self-pay | Admitting: Family Medicine

## 2018-10-10 ENCOUNTER — Other Ambulatory Visit: Payer: Self-pay | Admitting: Family Medicine

## 2019-01-03 ENCOUNTER — Other Ambulatory Visit: Payer: Self-pay | Admitting: Family Medicine

## 2019-01-31 ENCOUNTER — Telehealth: Payer: Self-pay | Admitting: Family Medicine

## 2019-01-31 DIAGNOSIS — G4733 Obstructive sleep apnea (adult) (pediatric): Secondary | ICD-10-CM

## 2019-01-31 NOTE — Telephone Encounter (Signed)
REFERRAL REQUEST Telephone Note  What type of referral do you need? Neurology  Have you been seen at our office for this problem? Yes - [facility] from Dr. Freddie Apley [facility] stated they they needed to renew Patient's Ref . (Advise that they will likely need an appointment with their PCP before a referral can be done)  Is there a particular doctor or location that you prefer? Dr. Merlene Laughter  Patient notified that referrals can take up to a week or longer to process. If they haven't heard anything within a week they should call back and speak with the referral department.

## 2019-02-01 NOTE — Telephone Encounter (Signed)
Patient aware and verbalizes understanding. 

## 2019-02-01 NOTE — Telephone Encounter (Signed)
I have placed the order to refer to Dr. Freddie Apley office for sleep apnea for the patient.

## 2019-02-03 DIAGNOSIS — E785 Hyperlipidemia, unspecified: Secondary | ICD-10-CM | POA: Diagnosis not present

## 2019-02-03 DIAGNOSIS — I739 Peripheral vascular disease, unspecified: Secondary | ICD-10-CM | POA: Diagnosis not present

## 2019-02-03 DIAGNOSIS — I10 Essential (primary) hypertension: Secondary | ICD-10-CM | POA: Diagnosis not present

## 2019-02-03 DIAGNOSIS — D6869 Other thrombophilia: Secondary | ICD-10-CM | POA: Diagnosis not present

## 2019-02-03 DIAGNOSIS — I4891 Unspecified atrial fibrillation: Secondary | ICD-10-CM | POA: Diagnosis not present

## 2019-02-03 DIAGNOSIS — Z6829 Body mass index (BMI) 29.0-29.9, adult: Secondary | ICD-10-CM | POA: Diagnosis not present

## 2019-02-03 DIAGNOSIS — Z7982 Long term (current) use of aspirin: Secondary | ICD-10-CM | POA: Diagnosis not present

## 2019-02-03 DIAGNOSIS — K219 Gastro-esophageal reflux disease without esophagitis: Secondary | ICD-10-CM | POA: Diagnosis not present

## 2019-02-03 DIAGNOSIS — R69 Illness, unspecified: Secondary | ICD-10-CM | POA: Diagnosis not present

## 2019-02-03 DIAGNOSIS — E663 Overweight: Secondary | ICD-10-CM | POA: Diagnosis not present

## 2019-02-03 DIAGNOSIS — Z008 Encounter for other general examination: Secondary | ICD-10-CM | POA: Diagnosis not present

## 2019-02-08 ENCOUNTER — Telehealth: Payer: Self-pay | Admitting: Student

## 2019-02-08 DIAGNOSIS — R5383 Other fatigue: Secondary | ICD-10-CM | POA: Diagnosis not present

## 2019-02-08 DIAGNOSIS — I10 Essential (primary) hypertension: Secondary | ICD-10-CM | POA: Diagnosis not present

## 2019-02-08 DIAGNOSIS — R413 Other amnesia: Secondary | ICD-10-CM | POA: Diagnosis not present

## 2019-02-08 DIAGNOSIS — G4733 Obstructive sleep apnea (adult) (pediatric): Secondary | ICD-10-CM | POA: Diagnosis not present

## 2019-02-08 NOTE — Telephone Encounter (Signed)

## 2019-02-09 NOTE — Progress Notes (Signed)
Virtual Visit via Telephone Note   This visit type was conducted due to national recommendations for restrictions regarding the COVID-19 Pandemic (e.g. social distancing) in an effort to limit this patient's exposure and mitigate transmission in our community.  Due to his co-morbid illnesses, this patient is at least at moderate risk for complications without adequate follow up.  This format is felt to be most appropriate for this patient at this time.  The patient did not have access to video technology/had technical difficulties with video requiring transitioning to audio format only (telephone).  All issues noted in this document were discussed and addressed.  No physical exam could be performed with this format.  Please refer to the patient's chart for his  consent to telehealth for [name].   Date:  02/10/2019   ID:  Christopher Lang, DOB Jun 08, 1955, MRN [id number]  Patient Location: Home Provider Location: Office  PCP:  Dettinger, Fransisca Kaufmann, MD  Cardiologist:  Rozann Lesches, MD  Electrophysiologist:  None   Evaluation Performed:  Follow-Up Visit  Chief Complaint:  Elevated BP  History of Present Illness:    Christopher Lang is a 64 y.o. male with past medical history of remote atrial fibrillation (occurring in 2017 with no documented recurrence since and no longer on anticoagulation), HTN, HLD and OSA (diagnosed in the past - not yet on CPAP) who presents for a telehealth follow-up visit in regards to elevated BP.  He most recently had a phone visit with Dr. Domenic Polite in 09/2018 and denied any recent chest pain, palpitations or dyspnea at that time.  BP was elevated at 144/69 and he was continued on his current medication regimen at that time including Cardizem CD 180 mg daily, HCTZ 25 mg daily and Losartan 100 mg daily.  In talking with the patient today, he reports overall doing well since his last visit. He denies any recent chest pain, dyspnea on exertion, orthopnea, PND or  lower extremity edema. No recent palpitations or dizziness. He was evaluated by Neurology earlier this week in anticipation of having a repeat home sleep study and says SBP was elevated into the 170's at that time. He has noticed SBP has been in the 150's to 160's when checked at home over the past few months. He reports good compliance with his current medication regimen and denies missing any recent doses.  He does consume fast food 2-3 times per week and reports adding salt to a majority of his meals. He does have salt substitute but does not use this regularly.  The patient does not have symptoms concerning for COVID-19 infection (fever, chills, cough, or new shortness of breath).    Past Medical History:  Diagnosis Date  . Cataract    Removed Dec 2017/Jan 2018  . COPD (chronic obstructive pulmonary disease) ([place])   . Essential hypertension   . H. pylori infection 11/2016   With documented eradication via biopsy Feb 2019  . History of atrial fibrillation    November 2017  . Hyperlipidemia   . Sleep apnea    Did not tolerate CPAP  . Tubulovillous adenoma of rectum 04/2010   Past Surgical History:  Procedure Laterality Date  . CATARACT EXTRACTION W/PHACO Right 01/16/2016   Procedure: CATARACT EXTRACTION PHACO AND INTRAOCULAR LENS PLACEMENT RIGHT EYE CDE= 6.97;  Surgeon: Tonny Branch, MD;  Location: [facility];  Service: Ophthalmology;  Laterality: Right;  right  . CATARACT EXTRACTION W/PHACO Left 01/30/2016   Procedure: CATARACT EXTRACTION PHACO AND INTRAOCULAR LENS  PLACEMENT LEFT EYE CDE 3.13;  Surgeon: Tonny Branch, MD;  Location: [facility];  Service: Ophthalmology;  Laterality: Left;  left  . COLONOSCOPY  2012  . COLONOSCOPY N/A 12/04/2016   three 4-6 mm tubular adenomas, moderate size external and internal hemorrhoids, redundant left colon, 3 year surveillance  . ESOPHAGOGASTRODUODENOSCOPY N/A 12/04/2016   Dr. Oneida Alar: normal esophagus, small hiatal hernia, chronic gastritis with H.pylori,  peptic duodenitis, atypical lymphoid proliferation. Surveillance EGD Feb 2019 without abnormal cells, negative H.pylori  . ESOPHAGOGASTRODUODENOSCOPY N/A 03/15/2017   mild gastritis, no atypical cells, negative H.pylori  . EXCISION, SKI  08/2016   R THIGH     Current Meds  Medication Sig  . aspirin EC 81 MG tablet Take 81 mg by mouth daily.  Marland Kitchen atorvastatin (LIPITOR) 10 MG tablet Take 1 tablet (10 mg total) by mouth daily at 6 PM.  . cholecalciferol (VITAMIN D) 1000 units tablet Take 1,000 Units by mouth daily.  Marland Kitchen diltiazem (CARDIZEM CD) 240 MG 24 hr capsule Take 1 capsule (240 mg total) by mouth daily.  . fish oil-omega-3 fatty acids 1000 MG capsule Take 1,000 mg by mouth daily.    Marland Kitchen losartan-hydrochlorothiazide (HYZAAR) 100-25 MG tablet TAKE 1 TABLET BY MOUTH EVERY DAY  . pantoprazole (PROTONIX) 40 MG tablet Take 1 tablet (40 mg total) by mouth daily before breakfast.  . [DISCONTINUED] diltiazem (CARDIZEM CD) 180 MG 24 hr capsule Take 1 capsule (180 mg total) by mouth daily.     Allergies:   Patient has no known allergies.   Social History   Tobacco Use  . Smoking status: Current Every Day Smoker    Packs/day: 1.00    Years: 45.00    Pack years: 45.00    Types: Cigarettes    Last attempt to quit: 02/28/2017    Years since quitting: 1.9  . Smokeless tobacco: Never Used  . Tobacco comment: started back - pack per day  Substance Use Topics  . Alcohol use: No    Alcohol/week: 0.0 standard drinks    Comment: Quit in November  . Drug use: No     Family Hx: The patient's family history includes Arthritis in his sister, sister, and sister; CAD in his mother; Cancer in his brother and brother; Deep vein thrombosis in his brother, brother, and sister; Diabetes in his sister and sister; Early death in his father; Heart attack in his brother, mother, sister, sister, and sister; High blood pressure in his mother; Hypertension in his daughter, sister, and sister; Kidney disease in his  brother; Leukemia in his father; Seizures in his brother; Stroke in his sister and sister. There is no history of Colon cancer or Colon polyps.  ROS:   Please see the history of present illness.     All other systems reviewed and are negative.   Prior CV studies:   The following studies were reviewed today:  Echocardiogram: 11/2015 Study Conclusions  - Left ventricle: The cavity size was normal. Wall thickness was   normal. Systolic function was normal. The estimated ejection   fraction was in the range of 60% to 65%. Left ventricular   diastolic function parameters were normal. - Aortic valve: Valve area (VTI): 1.74 cm^2. Valve area (Vmax):   1.81 cm^2. Valve area (Vmean): 1.58 cm^2. - Mitral valve: Calcified annulus. Mildly thickened leaflets . - Left atrium: The atrium was mildly dilated. - Atrial septum: No defect or patent foramen ovale was identified.  Labs/Other Tests and Data Reviewed:    EKG:  No ECG reviewed.  Recent Labs: 06/23/2018: ALT 28; BUN 12; Creatinine, Ser 0.96; Hemoglobin 14.1; Platelets 240; Potassium 3.8; Sodium 139   Recent Lipid Panel Lab Results  Component Value Date/Time   CHOL 160 06/23/2018 02:45 PM   TRIG 200 (H) 06/23/2018 02:45 PM   HDL 29 (L) 06/23/2018 02:45 PM   CHOLHDL 5.5 (H) 06/23/2018 02:45 PM   CHOLHDL 4.4 09/03/2016 09:42 AM   LDLCALC 91 06/23/2018 02:45 PM    Wt Readings from Last 3 Encounters:  02/10/19 215 lb (97.5 kg)  10/03/18 210 lb (95.3 kg)  06/23/18 211 lb 9.6 oz (96 kg)     Objective:    Vital Signs:  BP (!) 159/85   Pulse 72   Ht 6' (1.829 m)   Wt 215 lb (97.5 kg)   BMI 29.16 kg/m    General: Pleasant male sounding in NAD Psych: Normal affect. Neuro: Alert and oriented X 3.  Lungs:  Resp regular and unlabored while talking on the phone.   ASSESSMENT & PLAN:    1. HTN - BP has been elevated when checked at home and at outpatient visits. He is currently on Cardizem CD 180mg  daily and Losartan-HCTZ  100-25mg  daily. Will try increasing Cardizem CD to 240 mg daily to see how this impacts his BP. If no significant improvement or he develops bradycardia (HR has been in the 70's when checked at home), would recommend switching to Amlodipine for improved BP control. He was initially on Cardizem due to his history of atrial fibrillation but has not had any episodes in 3+ years. If wishing to remain on Cardizem CD, would look at switching his HCTZ to Chlorthalidone but this would mean he would no longer be able to take a combination pill and he enjoys the convenience of this.  - Also recommended limiting sodium intake as he currently consumes a high-salt diet. He also has untreated OSA which could be contributing and he is scheduled for a home sleep study next week.   2. History of Atrial Fibrillation - He denies any recent palpitations and his last occurrence was in 2017 by review of records.  He has not been on anticoagulation given the timeframe since his recurrence. If noted to occur in the future, this should be initiated given his CHA2DS2-VASc Score of 3 (Aortic Plaque, HTN, Age).   3. HLD - followed by PCP. FLP in 06/2018 showed total cholesterol of 160, triglycerides 200, HDL 29 and LDL 91. Remains on Lipitor 10mg  daily.   4. OSA - Reports having tested positive for OSA in the past. Being followed by Neurology and scheduled for a home sleep study next week.    COVID-19 Education: The signs and symptoms of COVID-19 were discussed with the patient and how to seek care for testing (follow up with PCP or arrange E-visit).  The importance of social distancing was discussed today.  Time:   Today, I have spent 16 minutes with the patient with telehealth technology discussing the above problems.     Medication Adjustments/Labs and Tests Ordered: Current medicines are reviewed at length with the patient today.  Concerns regarding medicines are outlined above.   Tests Ordered: No orders of the  defined types were placed in this encounter.   Medication Changes: Meds ordered this encounter  Medications  . diltiazem (CARDIZEM CD) 240 MG 24 hr capsule    Sig: Take 1 capsule (240 mg total) by mouth daily.    Dispense:  30 capsule  Refill:  3    Order Specific Question:   Supervising Provider    Answer:   Dorothy Spark [id number]    Follow Up: Return BP Log in 2-3 weeks. Follow-up in 3-4 months.   Signed, Erma Heritage, PA-C  02/10/2019 1:30 PM    [facility]

## 2019-02-10 ENCOUNTER — Encounter: Payer: Self-pay | Admitting: Student

## 2019-02-10 ENCOUNTER — Telehealth (INDEPENDENT_AMBULATORY_CARE_PROVIDER_SITE_OTHER): Payer: Medicare HMO | Admitting: Student

## 2019-02-10 VITALS — BP 159/85 | HR 72 | Ht 72.0 in | Wt 215.0 lb

## 2019-02-10 DIAGNOSIS — I10 Essential (primary) hypertension: Secondary | ICD-10-CM | POA: Diagnosis not present

## 2019-02-10 DIAGNOSIS — E782 Mixed hyperlipidemia: Secondary | ICD-10-CM

## 2019-02-10 DIAGNOSIS — Z8679 Personal history of other diseases of the circulatory system: Secondary | ICD-10-CM

## 2019-02-10 DIAGNOSIS — G4733 Obstructive sleep apnea (adult) (pediatric): Secondary | ICD-10-CM

## 2019-02-10 MED ORDER — DILTIAZEM HCL ER COATED BEADS 240 MG PO CP24: 240.0000 mg | ORAL_CAPSULE | Freq: Every day | ORAL | 3 refills | Status: DC

## 2019-02-10 NOTE — Patient Instructions (Addendum)
Medication Instructions:  Your physician has recommended you make the following change in your medication:  Increase Cardizem CD to 240 Daily    *If you need a refill on your cardiac medications before your next appointment, please call your pharmacy*  Lab Work: NONE   If you have labs (blood work) drawn today and your tests are completely normal, you will receive your results only by: Marland Kitchen MyChart Message (if you have MyChart) OR . A paper copy in the mail If you have any lab test that is abnormal or we need to change your treatment, we will call you to review the results.  Testing/Procedures: Your physician has requested that you regularly monitor and record your blood pressure readings at home. Please use the same machine at the same time of day to check your readings and record them to bring to your follow-up visit.  Record for 3 weeks and return to office    Follow-Up: At [facility], you and your health needs are our priority.  As part of our continuing mission to provide you with exceptional heart care, we have created designated Provider Care Teams.  These Care Teams include your primary Cardiologist (physician) and Advanced Practice Providers (APPs -  Physician Assistants and Nurse Practitioners) who all work together to provide you with the care you need, when you need it.  Your next appointment:   3-4  month(s)  The format for your next appointment:   Either In Person or Virtual  Provider:   You may see Rozann Lesches, MD or one of the following Advanced Practice Providers on your designated Care Team:    Bernerd Pho, PA-C   Ermalinda Barrios, PA-C    Other Instructions Thank you for choosing [facility]!

## 2019-02-18 DIAGNOSIS — G4730 Sleep apnea, unspecified: Secondary | ICD-10-CM | POA: Diagnosis not present

## 2019-03-09 ENCOUNTER — Ambulatory Visit (INDEPENDENT_AMBULATORY_CARE_PROVIDER_SITE_OTHER): Payer: Medicare HMO | Admitting: Family Medicine

## 2019-03-09 ENCOUNTER — Other Ambulatory Visit: Payer: Self-pay

## 2019-03-09 ENCOUNTER — Encounter: Payer: Self-pay | Admitting: Family Medicine

## 2019-03-09 DIAGNOSIS — I10 Essential (primary) hypertension: Secondary | ICD-10-CM

## 2019-03-09 DIAGNOSIS — J449 Chronic obstructive pulmonary disease, unspecified: Secondary | ICD-10-CM

## 2019-03-09 DIAGNOSIS — E782 Mixed hyperlipidemia: Secondary | ICD-10-CM | POA: Diagnosis not present

## 2019-03-09 DIAGNOSIS — I480 Paroxysmal atrial fibrillation: Secondary | ICD-10-CM

## 2019-03-09 MED ORDER — AZILSARTAN-CHLORTHALIDONE 40-25 MG PO TABS: 1.0000 | ORAL_TABLET | Freq: Every day | ORAL | 3 refills | Status: DC

## 2019-03-09 MED ORDER — PANTOPRAZOLE SODIUM 40 MG PO TBEC: 40.0000 mg | DELAYED_RELEASE_TABLET | Freq: Every day | ORAL | 3 refills | Status: DC

## 2019-03-09 MED ORDER — ATORVASTATIN CALCIUM 10 MG PO TABS: 10.0000 mg | ORAL_TABLET | Freq: Every day | ORAL | 3 refills | Status: DC

## 2019-03-09 NOTE — Progress Notes (Signed)
Virtual Visit via telephone Note  I connected with Christopher Lang on 03/09/19 at 0930 by telephone and verified that I am speaking with the correct person using two identifiers. Christopher Lang is currently located at home and no other people are currently with her during visit. The provider, Fransisca Kaufmann Alazia Crocket, MD is located in their office at time of visit.  Call ended at [phone]  I discussed the limitations, risks, security and privacy concerns of performing an evaluation and management service by telephone and the availability of in person appointments. I also discussed with the patient that there may be a patient responsible charge related to this service. The patient expressed understanding and agreed to proceed.  162/86 hr 66 History and Present Illness: Hypertension and Afib Patient is currently on losartan-hctz and diltiazem, and their blood pressure today is 162/86. Patient is having lightheadedness or dizziness. Patient denies headaches, blurred vision, chest pains, shortness of breath, or weakness. Denies any side effects from medication and is content with current medication. He just had a change on diltiazem and BP's did not improve.   Hyperlipidemia  Patient is coming in for recheck of his hyperlipidemia. The patient is currently taking atorvastin. They deny any issues with myalgias or history of liver damage from it. They deny any focal numbness or weakness or chest pain.   COPD Patient is coming in for COPD recheck today.  He is currently on no medication.  He has a mild chronic cough but denies any major coughing spells or wheezing spells.  He has 0nighttime symptoms per week and 0daytime symptoms per week currently.   No diagnosis found.  Outpatient Encounter Medications as of 03/09/2019  Medication Sig  . aspirin EC 81 MG tablet Take 81 mg by mouth daily.  Marland Kitchen atorvastatin (LIPITOR) 10 MG tablet Take 1 tablet (10 mg total) by mouth daily at 6 PM.  . cholecalciferol  (VITAMIN D) 1000 units tablet Take 1,000 Units by mouth daily.  Marland Kitchen diltiazem (CARDIZEM CD) 240 MG 24 hr capsule Take 1 capsule (240 mg total) by mouth daily.  . fish oil-omega-3 fatty acids 1000 MG capsule Take 1,000 mg by mouth daily.    Marland Kitchen losartan-hydrochlorothiazide (HYZAAR) 100-25 MG tablet TAKE 1 TABLET BY MOUTH EVERY DAY  . pantoprazole (PROTONIX) 40 MG tablet Take 1 tablet (40 mg total) by mouth daily before breakfast.   No facility-administered encounter medications on file as of 03/09/2019.    Review of Systems  Constitutional: Negative for chills and fever.  Eyes: Negative for visual disturbance.  Respiratory: Negative for shortness of breath and wheezing.   Cardiovascular: Negative for chest pain and leg swelling.  Musculoskeletal: Negative for back pain and gait problem.  Skin: Negative for rash.  Neurological: Negative for dizziness, weakness and light-headedness.  All other systems reviewed and are negative.   Observations/Objective: Patient sounds comfortable and in no acute distress  Assessment and Plan: Problem List Items Addressed This Visit      Cardiovascular and Mediastinum   Atrial fibrillation (HCC)   Relevant Medications   atorvastatin (LIPITOR) 10 MG tablet   Azilsartan-Chlorthalidone 40-25 MG TABS   Essential hypertension   Relevant Medications   atorvastatin (LIPITOR) 10 MG tablet   Azilsartan-Chlorthalidone 40-25 MG TABS     Respiratory   COPD (chronic obstructive pulmonary disease) (HCC)     Other   Hyperlipidemia - Primary   Relevant Medications   atorvastatin (LIPITOR) 10 MG tablet   Azilsartan-Chlorthalidone 40-25 MG TABS  Follow up plan: Return in about 2 months (around 05/07/2019), or if symptoms worsen or fail to improve, for htn and hld.     I discussed the assessment and treatment plan with the patient. The patient was provided an opportunity to ask questions and all were answered. The patient agreed with the plan and  demonstrated an understanding of the instructions.   The patient was advised to call back or seek an in-person evaluation if the symptoms worsen or if the condition fails to improve as anticipated.  The above assessment and management plan was discussed with the patient. The patient verbalized understanding of and has agreed to the management plan. Patient is aware to call the clinic if symptoms persist or worsen. Patient is aware when to return to the clinic for a follow-up visit. Patient educated on when it is appropriate to go to the emergency department.    I provided 16 minutes of non-face-to-face time during this encounter.    Worthy Rancher, MD

## 2019-03-27 ENCOUNTER — Other Ambulatory Visit: Payer: Self-pay | Admitting: Family Medicine

## 2019-05-01 DIAGNOSIS — Z0100 Encounter for examination of eyes and vision without abnormal findings: Secondary | ICD-10-CM | POA: Diagnosis not present

## 2019-05-01 DIAGNOSIS — H5201 Hypermetropia, right eye: Secondary | ICD-10-CM | POA: Diagnosis not present

## 2019-05-09 ENCOUNTER — Other Ambulatory Visit: Payer: Self-pay | Admitting: Student

## 2019-05-09 DIAGNOSIS — I10 Essential (primary) hypertension: Secondary | ICD-10-CM

## 2019-05-09 DIAGNOSIS — E782 Mixed hyperlipidemia: Secondary | ICD-10-CM

## 2019-05-12 ENCOUNTER — Ambulatory Visit (INDEPENDENT_AMBULATORY_CARE_PROVIDER_SITE_OTHER): Payer: Medicare HMO | Admitting: Cardiology

## 2019-05-12 ENCOUNTER — Other Ambulatory Visit: Payer: Self-pay

## 2019-05-12 ENCOUNTER — Encounter: Payer: Self-pay | Admitting: Cardiology

## 2019-05-12 VITALS — BP 138/64 | HR 69 | Ht 72.0 in | Wt 214.0 lb

## 2019-05-12 DIAGNOSIS — Z8679 Personal history of other diseases of the circulatory system: Secondary | ICD-10-CM

## 2019-05-12 DIAGNOSIS — R011 Cardiac murmur, unspecified: Secondary | ICD-10-CM

## 2019-05-12 DIAGNOSIS — I10 Essential (primary) hypertension: Secondary | ICD-10-CM

## 2019-05-12 NOTE — Progress Notes (Signed)
Cardiology Office Note  Date: 05/12/2019   ID: Christopher Lang, Christopher Lang Nov 25, 1955, MRN [id number]  PCP:  Dettinger, Fransisca Kaufmann, MD  Cardiologist:  Rozann Lesches, MD Electrophysiologist:  None   Chief Complaint  Patient presents with  . Cardiac follow-up    History of Present Illness: Christopher Lang is a 64 y.o. male last assessed via telehealth encounter in January by Ms. Strader PA-C.  He presents for a follow-up visit.  States that blood pressure has been better controlled since last assessment.  He does not report any palpitations or chest pain.  At the previous visit Cardizem CD was increased to 240 mg daily and he was otherwise continued on losartan HCTZ.  He is tolerating this well.  He does not describe any major change in health otherwise.  I personally reviewed his ECG today which shows normal sinus rhythm.  Past Medical History:  Diagnosis Date  . Cataract    Removed Dec 2017/Jan 2018  . COPD (chronic obstructive pulmonary disease) ([place])   . Essential hypertension   . H. pylori infection 11/2016   With documented eradication via biopsy Feb 2019  . History of atrial fibrillation    November 2017  . Hyperlipidemia   . Sleep apnea    Did not tolerate CPAP  . Tubulovillous adenoma of rectum 04/2010    Past Surgical History:  Procedure Laterality Date  . CATARACT EXTRACTION W/PHACO Right 01/16/2016   Procedure: CATARACT EXTRACTION PHACO AND INTRAOCULAR LENS PLACEMENT RIGHT EYE CDE= 6.97;  Surgeon: Tonny Branch, MD;  Location: [facility];  Service: Ophthalmology;  Laterality: Right;  right  . CATARACT EXTRACTION W/PHACO Left 01/30/2016   Procedure: CATARACT EXTRACTION PHACO AND INTRAOCULAR LENS PLACEMENT LEFT EYE CDE 3.13;  Surgeon: Tonny Branch, MD;  Location: [facility];  Service: Ophthalmology;  Laterality: Left;  left  . COLONOSCOPY  2012  . COLONOSCOPY N/A 12/04/2016   three 4-6 mm tubular adenomas, moderate size external and internal hemorrhoids, redundant left colon, 3  year surveillance  . ESOPHAGOGASTRODUODENOSCOPY N/A 12/04/2016   Dr. Oneida Alar: normal esophagus, small hiatal hernia, chronic gastritis with H.pylori, peptic duodenitis, atypical lymphoid proliferation. Surveillance EGD Feb 2019 without abnormal cells, negative H.pylori  . ESOPHAGOGASTRODUODENOSCOPY N/A 03/15/2017   mild gastritis, no atypical cells, negative H.pylori  . EXCISION, SKI  08/2016   R THIGH    Current Outpatient Medications  Medication Sig Dispense Refill  . aspirin EC 81 MG tablet Take 81 mg by mouth daily.    Marland Kitchen atorvastatin (LIPITOR) 10 MG tablet Take 1 tablet (10 mg total) by mouth daily at 6 PM. 90 tablet 3  . Azilsartan-Chlorthalidone 40-25 MG TABS Take 1 tablet by mouth daily. 90 tablet 3  . cholecalciferol (VITAMIN D) 1000 units tablet Take 1,000 Units by mouth daily.    Marland Kitchen diltiazem (CARDIZEM CD) 240 MG 24 hr capsule TAKE 1 CAPSULE BY MOUTH EVERY DAY 90 capsule 1  . fish oil-omega-3 fatty acids 1000 MG capsule Take 1,000 mg by mouth daily.      . pantoprazole (PROTONIX) 40 MG tablet Take 1 tablet (40 mg total) by mouth daily before breakfast. 90 tablet 3   No current facility-administered medications for this visit.   Allergies:  Patient has no known allergies.   ROS:   No palpitations or syncope.  Physical Exam: VS:  BP 138/64   Pulse 69   Ht 6' (1.829 m)   Wt 214 lb (97.1 kg)   SpO2 98%   BMI 29.02  kg/m , BMI Body mass index is 29.02 kg/m.  Wt Readings from Last 3 Encounters:  05/12/19 214 lb (97.1 kg)  02/10/19 215 lb (97.5 kg)  10/03/18 210 lb (95.3 kg)    General: Patient appears comfortable at rest. HEENT: Conjunctiva and lids normal, wearing a mask. Neck: Supple, no elevated JVP or carotid bruits, no thyromegaly. Lungs: Clear to auscultation, nonlabored breathing at rest. Cardiac: Regular rate and rhythm, no S3, 3/6 basal systolic murmur, no pericardial rub. Abdomen: Soft, nontender, bowel sounds present. Extremities: No pitting edema, distal  pulses 2+.  ECG:  An ECG dated 07/01/2017 was personally reviewed today and demonstrated:  Sinus bradycardia.  Recent Labwork: 06/23/2018: ALT 28; AST 35; BUN 12; Creatinine, Ser 0.96; Hemoglobin 14.1; Platelets 240; Potassium 3.8; Sodium 139     Component Value Date/Time   CHOL 160 06/23/2018 1445   TRIG 200 (H) 06/23/2018 1445   HDL 29 (L) 06/23/2018 1445   CHOLHDL 5.5 (H) 06/23/2018 1445   CHOLHDL 4.4 09/03/2016 0942   VLDL 23 09/03/2016 0942   LDLCALC 91 06/23/2018 1445    Other Studies Reviewed Today:  Echocardiogram 12/03/2015: Study Conclusions  - Left ventricle: The cavity size was normal. Wall thickness was normal. Systolic function was normal. The estimated ejection fraction was in the range of 60% to 65%. Left ventricular diastolic function parameters were normal. - Aortic valve: Valve area (VTI): 1.74 cm^2. Valve area (Vmax): 1.81 cm^2. Valve area (Vmean): 1.58 cm^2. - Mitral valve: Calcified annulus. Mildly thickened leaflets . - Left atrium: The atrium was mildly dilated. - Atrial septum: No defect or patent foramen ovale was identified.  Assessment and Plan:  1.  History of atrial fibrillation as of 2017.  He has had no obvious recurrences documented, no palpitations.  ECG today shows normal sinus rhythm.  CHA2DS2-VASc score is 2.  Do not plan on anticoagulation unless he has a documented recurrence.  2.  Cardiac murmur in aortic position and suggestive of stenosis.  Last echocardiogram was in 2017.  Follow-up study will be obtained.  3.  Essential hypertension, continue current regimen including Cardizem CD and Azilsartan-chlorthalidone.  Medication Adjustments/Labs and Tests Ordered: Current medicines are reviewed at length with the patient today.  Concerns regarding medicines are outlined above.   Tests Ordered: Orders Placed This Encounter  Procedures  . EKG 12-Lead  . ECHOCARDIOGRAM COMPLETE    Medication Changes: No orders of the defined  types were placed in this encounter.   Disposition:  Follow up test results and determine disposition.  Signed, Satira Sark, MD, [facility] 05/12/2019 10:11 AM    [place] [facility] [place], [place], [place] [address] Phone: [phone]; Fax: [phone]

## 2019-05-12 NOTE — Patient Instructions (Addendum)
Medication Instructions:   Your physician recommends that you continue on your current medications as directed. Please refer to the Current Medication list given to you today.  Labwork:  NONE  Testing/Procedures: Your physician has requested that you have an echocardiogram. Echocardiography is a painless test that uses sound waves to create images of your heart. It provides your doctor with information about the size and shape of your heart and how well your heart's chambers and valves are working. This procedure takes approximately one hour. There are no restrictions for this procedure.  Follow-Up:  Your physician recommends that you schedule a follow-up appointment in: pending.  Any Other Special Instructions Will Be Listed Below (If Applicable).  If you need a refill on your cardiac medications before your next appointment, please call your pharmacy. 

## 2019-05-15 NOTE — Progress Notes (Signed)
   See phone note. Appt not necessary.

## 2019-05-16 ENCOUNTER — Encounter: Payer: Self-pay | Admitting: Gastroenterology

## 2019-05-16 ENCOUNTER — Other Ambulatory Visit: Payer: Self-pay

## 2019-05-16 ENCOUNTER — Telehealth: Payer: Self-pay | Admitting: Gastroenterology

## 2019-05-16 ENCOUNTER — Ambulatory Visit (INDEPENDENT_AMBULATORY_CARE_PROVIDER_SITE_OTHER): Payer: Medicare HMO | Admitting: Gastroenterology

## 2019-05-16 VITALS — BP 160/74 | HR 84 | Temp 97.1°F | Ht 72.0 in | Wt 214.6 lb

## 2019-05-16 DIAGNOSIS — D126 Benign neoplasm of colon, unspecified: Secondary | ICD-10-CM

## 2019-05-16 NOTE — Telephone Encounter (Signed)
Noted.  Placed reminder note on current recall for this year.

## 2019-05-16 NOTE — Telephone Encounter (Signed)
Patient presented today stating he had called in to ensure he still had a gastroenterologist as Dr. Oneida Alar was leaving. There was some miscommunication about need for office visit. He is doing well and only wanted to make sure he would still have care here.   We cancelled office visit for today. He will be triaged for colonoscopy that is due in Nov 2021 due to history of adenomas.   Angie: just making sure he is on list for that time to be triaged over the phone .

## 2019-05-22 DIAGNOSIS — M7740 Metatarsalgia, unspecified foot: Secondary | ICD-10-CM | POA: Diagnosis not present

## 2019-05-22 DIAGNOSIS — M79675 Pain in left toe(s): Secondary | ICD-10-CM | POA: Diagnosis not present

## 2019-05-22 DIAGNOSIS — M2012 Hallux valgus (acquired), left foot: Secondary | ICD-10-CM | POA: Diagnosis not present

## 2019-06-07 ENCOUNTER — Ambulatory Visit (INDEPENDENT_AMBULATORY_CARE_PROVIDER_SITE_OTHER): Payer: Medicare HMO

## 2019-06-07 ENCOUNTER — Other Ambulatory Visit: Payer: Self-pay

## 2019-06-07 DIAGNOSIS — R011 Cardiac murmur, unspecified: Secondary | ICD-10-CM | POA: Diagnosis not present

## 2019-06-09 ENCOUNTER — Telehealth: Payer: Self-pay | Admitting: *Deleted

## 2019-06-09 NOTE — Telephone Encounter (Signed)
Patient informed. Copy sent to PCP °

## 2019-06-09 NOTE — Telephone Encounter (Signed)
-----   Message from Satira Sark, MD sent at 06/07/2019  7:43 PM EDT ----- Results reviewed.  Echocardiogram shows normal LVEF at 60 to 65% with normal diastolic parameters.  Aortic valve calcified with probable mild aortic stenosis.  Please schedule a 1 year follow-up visit.

## 2019-06-26 DIAGNOSIS — M79674 Pain in right toe(s): Secondary | ICD-10-CM | POA: Diagnosis not present

## 2019-06-26 DIAGNOSIS — M7740 Metatarsalgia, unspecified foot: Secondary | ICD-10-CM | POA: Diagnosis not present

## 2019-06-26 DIAGNOSIS — M79675 Pain in left toe(s): Secondary | ICD-10-CM | POA: Diagnosis not present

## 2019-08-14 NOTE — Progress Notes (Addendum)
Cardiology Office Note  Date: 08/15/2019   ID: Christopher Lang, DOB Jun 10, 1955, MRN [id number]  PCP:  Dettinger, Fransisca Kaufmann, MD  Cardiologist:  Rozann Lesches, MD Electrophysiologist:  None   Chief Complaint: Follow-up complaints of weakness, specifically bilateral arm weakness.,  Fatigue.  1 episode of recent slurred speech.  Essential hypertension, history of atrial fibrillation, cardiac murmur  History of Present Illness: Christopher Lang is a 64 y.o. male with a history of essential hypertension, history of atrial fibrillation, cardiac murmur.  Last encounter with Dr. Domenic Polite 05/12/2019.  He reported blood pressure has been better controlled since last assessment.  Did not report any palpitations or chest pain.  Cardizem CD 240 mg daily.  He was to continue on losartan.  He was tolerating well.  He had no recurrences of documented palpitations or atrial fibrillation.  Chads vas score was 2.  No plans to anticoagulate unless documented recurrence.  Cardiac murmur in the aortic position suggestive of stenosis.  Last echocardiogram 2017.  Repeat echocardiogram 06/07/2019 was essentially normal with LVEF of 60 to 65% and trivial MR  He presents today with complaints of bilateral arm numbness/weakness extending into his hands with increased fatigue and recent episode x1 of slurred speech.  Denies any subsequent CVA or TIA-like symptoms.  Speech is normal today without slurring.  Strength in both arms bilaterally 4+ grips bilaterally.  States he has not had lab work in over 1 year.  Denies any palpitations or arrhythmias.  States he has had some dizziness but no presyncope or near syncopal episodes.  Blood pressure is within normal limits today.  He denies any recent acute illnesses or hospitalizations.  Denies any PND orthopnea, bleeding issues.  Does complain of some claudication-like pain.  States the muscles in his legs become tight and slightly painful on walking and relieved at rest.  No lower  extremity edema.   Past Medical History:  Diagnosis Date  . Cataract    Removed Dec 2017/Jan 2018  . COPD (chronic obstructive pulmonary disease) ([place])   . Essential hypertension   . H. pylori infection 11/2016   With documented eradication via biopsy Feb 2019  . History of atrial fibrillation    November 2017  . Hyperlipidemia   . Sleep apnea    Did not tolerate CPAP  . Tubulovillous adenoma of rectum 04/2010    Past Surgical History:  Procedure Laterality Date  . CATARACT EXTRACTION W/PHACO Right 01/16/2016   Procedure: CATARACT EXTRACTION PHACO AND INTRAOCULAR LENS PLACEMENT RIGHT EYE CDE= 6.97;  Surgeon: Tonny Branch, MD;  Location: [facility];  Service: Ophthalmology;  Laterality: Right;  right  . CATARACT EXTRACTION W/PHACO Left 01/30/2016   Procedure: CATARACT EXTRACTION PHACO AND INTRAOCULAR LENS PLACEMENT LEFT EYE CDE 3.13;  Surgeon: Tonny Branch, MD;  Location: [facility];  Service: Ophthalmology;  Laterality: Left;  left  . COLONOSCOPY  2012  . COLONOSCOPY N/A 12/04/2016   three 4-6 mm tubular adenomas, moderate size external and internal hemorrhoids, redundant left colon, 3 year surveillance  . ESOPHAGOGASTRODUODENOSCOPY N/A 12/04/2016   Dr. Oneida Alar: normal esophagus, small hiatal hernia, chronic gastritis with H.pylori, peptic duodenitis, atypical lymphoid proliferation. Surveillance EGD Feb 2019 without abnormal cells, negative H.pylori  . ESOPHAGOGASTRODUODENOSCOPY N/A 03/15/2017   mild gastritis, no atypical cells, negative H.pylori  . EXCISION, SKI  08/2016   R THIGH    Current Outpatient Medications  Medication Sig Dispense Refill  . aspirin EC 81 MG tablet Take 81 mg by mouth daily.    Marland Kitchen  atorvastatin (LIPITOR) 10 MG tablet Take 1 tablet (10 mg total) by mouth daily at 6 PM. 90 tablet 3  . Azilsartan-Chlorthalidone 40-25 MG TABS Take 1 tablet by mouth daily. 90 tablet 3  . cholecalciferol (VITAMIN D) 1000 units tablet Take 1,000 Units by mouth daily.    Marland Kitchen diltiazem  (CARDIZEM CD) 240 MG 24 hr capsule TAKE 1 CAPSULE BY MOUTH EVERY DAY 90 capsule 1  . fish oil-omega-3 fatty acids 1000 MG capsule Take 1,000 mg by mouth daily.      . pantoprazole (PROTONIX) 40 MG tablet Take 1 tablet (40 mg total) by mouth daily before breakfast. 90 tablet 3   No current facility-administered medications for this visit.   Allergies:  Patient has no known allergies.   Social History: The patient  reports that he has been smoking cigarettes. He has a 45.00 pack-year smoking history. He has never used smokeless tobacco. He reports current alcohol use. He reports that he does not use drugs.   Family History: The patient's family history includes Arthritis in his sister, sister, and sister; CAD in his mother; Cancer in his brother and brother; Deep vein thrombosis in his brother, brother, and sister; Diabetes in his sister and sister; Early death in his father; Heart attack in his brother, mother, sister, sister, and sister; High blood pressure in his mother; Hypertension in his daughter, sister, and sister; Kidney disease in his brother; Leukemia in his father; Seizures in his brother; Stroke in his sister and sister.   ROS:  Please see the history of present illness. Otherwise, complete review of systems is positive for none.  All other systems are reviewed and negative.   Physical Exam: VS:  BP 128/68   Pulse 60   Ht 6' (1.829 m)   Wt 199 lb (90.3 kg)   SpO2 97%   BMI 26.99 kg/m , BMI Body mass index is 26.99 kg/m.  Wt Readings from Last 3 Encounters:  08/15/19 199 lb (90.3 kg)  05/16/19 214 lb 9.6 oz (97.3 kg)  05/12/19 214 lb (97.1 kg)    General: Patient appears comfortable at rest. Neck: Supple, no elevated JVP or carotid bruits, no thyromegaly. Lungs: Clear to auscultation, nonlabored breathing at rest. Cardiac: Regular rate and rhythm, no S3 or significant systolic murmur, no pericardial rub. Extremities: No pitting edema, distal pulses 2+. Skin: Warm and  dry. Musculoskeletal: No kyphosis. Neuropsychiatric: Alert and oriented x3, affect grossly appropriate.  ECG:  An ECG dated 08/15/2019 was personally reviewed today and demonstrated:  Normal sinus rhythm, moderate voltage criteria for LVH, heart rate of 60, nonspecific ST and T wave abnormality.  Recent Labwork: No results found for requested labs within last 8760 hours.     Component Value Date/Time   CHOL 160 06/23/2018 1445   TRIG 200 (H) 06/23/2018 1445   HDL 29 (L) 06/23/2018 1445   CHOLHDL 5.5 (H) 06/23/2018 1445   CHOLHDL 4.4 09/03/2016 0942   VLDL 23 09/03/2016 0942   LDLCALC 91 06/23/2018 1445    Other Studies Reviewed Today:  Echocardiogram 06/07/2019  1. Left ventricular ejection fraction, by estimation, is 60 to 65%. The left ventricle has normal function. The left ventricle has no regional wall motion abnormalities. Left ventricular diastolic parameters were normal. 2. Right ventricular systolic function is normal. The right ventricular size is normal. There is normal pulmonary artery systolic pressure. 3. The mitral valve is degenerative. Trivial mitral valve regurgitation. 4. On visual inspection, there appears to be at least mild  calcific aortic stenosis (albeit hemodynamics do not support this). The aortic valve is tricuspid. Aortic valve regurgitation is not visualized. Aortic valve area, by VTI measures 2.21 cm. Aortic valve mean gradient measures 6.0 mmHg. Aortic valve Vmax measures 1.68 m/s. 5. The inferior vena cava is normal in size with greater than 50% respiratory variability, suggesting right atrial pressure of 3 mmHg. Comparison(s): Echocardiogram done 12/03/15 showed an EF of 65%  Assessment and Plan:  1. History of atrial fibrillation   2. Cardiac murmur   3. Essential hypertension   4. Weakness   5. Screening for diabetes mellitus   6. Claudication in peripheral vascular disease ([place])   7. Numbness of arm   8. Neck pain   9. Mixed  hyperlipidemia   10. Atrial fibrillation, unspecified type ([place])    1. History of atrial fibrillation Denies any recent palpitations or arrhythmias.  Recent EKG on 05/12/2019 showed sinus rhythm rate of 60.  Continue diltiazem 240 mg daily.  Continue aspirin 81 mg daily.  2. Cardiac murmur Recent echocardiogram 06/07/2019 showed trivial mitral valve regurgitation.  3. Essential hypertension Blood pressure well controlled with today's blood pressure of 128/68.  Continue azilsartan/chlorthalidone 40/25 mg daily.  4. Weakness Complaining of generalized weakness and fatigue.  Occasional dizziness but no presyncopal or syncopal episode.  Patient states he has not had any lab work in quite some time and has not seen his PCP.  Labs with PCP 1 year ago showed normal chemistry except for elevated glucose of 117 and calcium 10.5.  CBC essentially normal, lipid panel; TC 160, TG 200, HDL 29, LDL 91 June 23, 2018.  Please get CBC, CMP, lipid panel, TSH, T3, T4  5. Screening for diabetes mellitus Complaining of fatigue and weakness.  Hemoglobin A1c 1 year ago in June 5.5.  Please get a hemoglobin A1c to screen for diabetes.  6. Claudication in peripheral vascular disease ([place]) Complaining of bilateral leg pain on ambulation and relieved at rest.  Please get lower arterial Doppler study along with ABIs.  7. Numbness of arm weakness in both arms along with numbness and tingling.  C-spine x-rays ordered as well as referral to orthopedics for complaints of neck pain as noted below and #8  8. Neck pain lso complaining of right neck pain worse with rotation of his neck.  Please get C-spine x-ray.  Refer to orthopedics for complaints of neck pain.  9. Mixed hyperlipidemia Continue atorvastatin 10 mg daily.  We are obtaining lipid panel.    Medication Adjustments/Labs and Tests Ordered: Current medicines are reviewed at length with the patient today.  Concerns regarding medicines are outlined above.    Disposition: Follow-up with Dr. Domenic Polite or APP 3 months  Signed, Levell July, NP 08/15/2019 8:39 AM    [place] at [place], [place], [place] [address] Phone: [phone]; Fax: [phone]

## 2019-08-15 ENCOUNTER — Ambulatory Visit (HOSPITAL_COMMUNITY)
Admission: RE | Admit: 2019-08-15 | Discharge: 2019-08-15 | Disposition: A | Discharge status: Home / Self Care | Payer: Medicare HMO | Source: Ambulatory Visit | Attending: Family Medicine | Admitting: Family Medicine

## 2019-08-15 ENCOUNTER — Encounter: Payer: Self-pay | Admitting: Family Medicine

## 2019-08-15 ENCOUNTER — Other Ambulatory Visit: Payer: Self-pay | Admitting: Family Medicine

## 2019-08-15 ENCOUNTER — Ambulatory Visit (INDEPENDENT_AMBULATORY_CARE_PROVIDER_SITE_OTHER): Payer: Medicare HMO | Admitting: Family Medicine

## 2019-08-15 ENCOUNTER — Other Ambulatory Visit: Payer: Self-pay

## 2019-08-15 VITALS — BP 128/68 | HR 60 | Ht 72.0 in | Wt 199.0 lb

## 2019-08-15 DIAGNOSIS — Z131 Encounter for screening for diabetes mellitus: Secondary | ICD-10-CM | POA: Diagnosis not present

## 2019-08-15 DIAGNOSIS — R200 Anesthesia of skin: Secondary | ICD-10-CM | POA: Diagnosis not present

## 2019-08-15 DIAGNOSIS — E782 Mixed hyperlipidemia: Secondary | ICD-10-CM | POA: Diagnosis not present

## 2019-08-15 DIAGNOSIS — I739 Peripheral vascular disease, unspecified: Secondary | ICD-10-CM | POA: Diagnosis not present

## 2019-08-15 DIAGNOSIS — I10 Essential (primary) hypertension: Secondary | ICD-10-CM | POA: Diagnosis not present

## 2019-08-15 DIAGNOSIS — Z8679 Personal history of other diseases of the circulatory system: Secondary | ICD-10-CM | POA: Diagnosis not present

## 2019-08-15 DIAGNOSIS — M542 Cervicalgia: Secondary | ICD-10-CM | POA: Diagnosis not present

## 2019-08-15 DIAGNOSIS — R011 Cardiac murmur, unspecified: Secondary | ICD-10-CM

## 2019-08-15 DIAGNOSIS — I4891 Unspecified atrial fibrillation: Secondary | ICD-10-CM | POA: Diagnosis not present

## 2019-08-15 DIAGNOSIS — R7309 Other abnormal glucose: Secondary | ICD-10-CM | POA: Diagnosis not present

## 2019-08-15 DIAGNOSIS — R531 Weakness: Secondary | ICD-10-CM | POA: Diagnosis not present

## 2019-08-15 NOTE — Patient Instructions (Addendum)
Medication Instructions:   Your physician recommends that you continue on your current medications as directed. Please refer to the Current Medication list given to you today.  Labwork: Your physician recommends that you return for a FASTING lab work to check your  lipid profile, CMET, CBC, TSH, T3 & T4, HgA1C. Please do not eat or drink for at least 8 hours when you have this done. You may take your medications that morning with a sip of water.  This may be done at [facility] or [facility] ([place]) Monday-Friday from 8:00 am - 4:00 pm. No appointment is needed.  Testing/Procedures:  Your physician has requested that you have a lower extremity arterial exercise duplex. During this test, exercise and ultrasound are used to evaluate arterial blood flow in the legs. Allow one hour for this exam. There are no restrictions or special instructions. Your physician has requested that you have an ankle brachial index (ABI). During this test an ultrasound and blood pressure cuff are used to evaluate the arteries that supply the arms and legs with blood. Allow thirty minutes for this exam. There are no restrictions or special instructions.  Cervical Spine X-Ray  Follow-Up:  Your physician recommends that you schedule a follow-up appointment in: 3 months  You have been referred to Orthopedic doctor.  Any Other Special Instructions Will Be Listed Below (If Applicable).  If you need a refill on your cardiac medications before your next appointment, please call your pharmacy.

## 2019-08-15 NOTE — Addendum Note (Signed)
Addended by: Merlene Laughter on: 08/15/2019 04:51 PM   Modules accepted: Orders

## 2019-08-16 LAB — COMPREHENSIVE METABOLIC PANEL
AG Ratio: 1.8 (calc) (ref 1.0–2.5)
ALT: 26 U/L (ref 9–46)
AST: 32 U/L (ref 10–35)
Albumin: 4.4 g/dL (ref 3.6–5.1)
Alkaline phosphatase (APISO): 106 U/L (ref 35–144)
BUN/Creatinine Ratio: 16 (calc) (ref 6–22)
BUN: 23 mg/dL (ref 7–25)
CO2: 30 mmol/L (ref 20–32)
Calcium: 10.5 mg/dL — ABNORMAL HIGH (ref 8.6–10.3)
Chloride: 97 mmol/L — ABNORMAL LOW (ref 98–110)
Creat: 1.41 mg/dL — ABNORMAL HIGH (ref 0.70–1.25)
Globulin: 2.5 g/dL (calc) (ref 1.9–3.7)
Glucose, Bld: 96 mg/dL (ref 65–99)
Potassium: 3.6 mmol/L (ref 3.5–5.3)
Sodium: 136 mmol/L (ref 135–146)
Total Bilirubin: 0.8 mg/dL (ref 0.2–1.2)
Total Protein: 6.9 g/dL (ref 6.1–8.1)

## 2019-08-16 LAB — CBC
HCT: 37.8 % — ABNORMAL LOW (ref 38.5–50.0)
Hemoglobin: 13.1 g/dL — ABNORMAL LOW (ref 13.2–17.1)
MCH: 33.2 pg — ABNORMAL HIGH (ref 27.0–33.0)
MCHC: 34.7 g/dL (ref 32.0–36.0)
MCV: 95.7 fL (ref 80.0–100.0)
MPV: 11 fL (ref 7.5–12.5)
Platelets: 242 10*3/uL (ref 140–400)
RBC: 3.95 10*6/uL — ABNORMAL LOW (ref 4.20–5.80)
RDW: 11.5 % (ref 11.0–15.0)
WBC: 9.9 10*3/uL (ref 3.8–10.8)

## 2019-08-16 LAB — HEMOGLOBIN A1C
Hgb A1c MFr Bld: 5 % of total Hgb (ref <5.7)
Mean Plasma Glucose: 97 (calc)
eAG (mmol/L): 5.4 (calc)

## 2019-08-16 LAB — LIPID PANEL
Cholesterol: 131 mg/dL (ref <200)
HDL: 28 mg/dL — ABNORMAL LOW (ref >=40)
LDL Cholesterol (Calc): 77 mg/dL (calc)
Non-HDL Cholesterol (Calc): 103 mg/dL (calc) (ref <130)
Total CHOL/HDL Ratio: 4.7 (calc) (ref <5.0)
Triglycerides: 163 mg/dL — ABNORMAL HIGH (ref <150)

## 2019-08-16 LAB — T4, FREE: Free T4: 1.4 ng/dL (ref 0.8–1.8)

## 2019-08-16 LAB — TSH: TSH: 0.79 mIU/L (ref 0.40–4.50)

## 2019-08-16 LAB — T3, FREE: T3, Free: 3.6 pg/mL (ref 2.3–4.2)

## 2019-08-18 ENCOUNTER — Telehealth: Payer: Self-pay | Admitting: *Deleted

## 2019-08-18 DIAGNOSIS — M4802 Spinal stenosis, cervical region: Secondary | ICD-10-CM

## 2019-08-18 NOTE — Telephone Encounter (Signed)
-----   Message from Verta Ellen., NP sent at 08/16/2019  6:15 PM EDT ----- Please call the patient and let him know the lab work looked good. One is his renal function tests was elevated. More than likely due to dehydration. Tell him to take in more fluids to help correct this.  He has been working outside in the heat. He needs to stay hydrated. His thyroid tests look good. His CBC looks good .No signs of infection or anemia  His blood sugar was good and Hbg A1c was ok. No signs of diabetes.

## 2019-08-18 NOTE — Telephone Encounter (Signed)
-----   Message from Verta Ellen., NP sent at 08/16/2019  6:40 PM EDT ----- Please call the patient and tell him the neck xray showed foraminal stenosis. This means the openings on each vertebra where the spinal cord and nerves travel through are narrowed. This can cause the arm and hand weakness and numbness he is experiencing. We probably need to refer him to a spinal surgeon. There is a Dr. Vertell Limber in [place] who specializes in spinal problems. Ask him if he is willing to go see him. If so make the referral to him.  I forget the name of the practice. Thanks

## 2019-08-18 NOTE — Telephone Encounter (Signed)
Patient informed and verbalized understanding of plan. Copy sent to PCP 

## 2019-08-23 ENCOUNTER — Ambulatory Visit: Payer: Medicare HMO | Admitting: Orthopaedic Surgery

## 2019-08-29 ENCOUNTER — Ambulatory Visit (INDEPENDENT_AMBULATORY_CARE_PROVIDER_SITE_OTHER): Payer: Medicare HMO

## 2019-08-29 DIAGNOSIS — I739 Peripheral vascular disease, unspecified: Secondary | ICD-10-CM

## 2019-08-30 ENCOUNTER — Telehealth: Payer: Self-pay | Admitting: *Deleted

## 2019-08-30 MED ORDER — CILOSTAZOL 100 MG PO TABS: 100.0000 mg | ORAL_TABLET | Freq: Two times a day (BID) | ORAL | 3 refills | Status: DC

## 2019-08-30 NOTE — Telephone Encounter (Signed)
-----   Message from Verta Ellen., NP sent at 08/30/2019  2:01 PM EDT ----- Please let the patient know the arterial study of his legs showed some mild disease in the left leg and no disease in the right. None of these findings would explain the pain and tingling in his legs. Tell him he may want to talk to his PCP to possibly get an x ray of his lower spine to see if he has some issues in his back. Thanks

## 2019-08-30 NOTE — Telephone Encounter (Signed)
Pt voiced understanding and would like to try Pletal - Medication sent to pharmacy.  His arterial doppler study showed mild arterial disease in upper leg above the knee and medium disease in the lower legs in one of the arteries on the right and left. If he is still having leg pain tell him we could start him on a medication called Pletal / Cilostazol 100 mg po bid. If he is interested go ahead and fill the prescription. Thanks

## 2019-11-12 ENCOUNTER — Other Ambulatory Visit: Payer: Self-pay | Admitting: Cardiology

## 2019-11-12 DIAGNOSIS — I10 Essential (primary) hypertension: Secondary | ICD-10-CM

## 2019-11-12 DIAGNOSIS — E782 Mixed hyperlipidemia: Secondary | ICD-10-CM

## 2019-11-15 ENCOUNTER — Ambulatory Visit: Payer: Medicare HMO | Admitting: Cardiology

## 2019-11-21 ENCOUNTER — Encounter: Payer: Self-pay | Admitting: Internal Medicine

## 2019-11-21 ENCOUNTER — Other Ambulatory Visit: Payer: Self-pay | Admitting: Family Medicine

## 2019-12-11 NOTE — Progress Notes (Signed)
Cardiology Office Note  Date: 12/12/2019   ID: Christopher Lang, DOB 1955-09-24, MRN [id number]  PCP:  Dettinger, Fransisca Kaufmann, MD  Cardiologist:  Rozann Lesches, MD Electrophysiologist:  None   Chief Complaint  Patient presents with  . Cardiac follow-up    History of Present Illness: Christopher Lang is a 64 y.o. male last seen in July by Mr. Leonides Sake NP.  He presents for a routine follow-up visit.  From a cardiac perspective, reports no palpitations or chest discomfort.  He has just started physical therapy for treatment of cervical pain related to disc disease and foraminal stenosis.  I reviewed his ECG and echocardiogram from earlier this year.  He does have at least mild calcific aortic stenosis, cardiac murmur consistent with this on examination.  He is asymptomatic.  We have discussed plan for continued observation over time.  Past Medical History:  Diagnosis Date  . Cataract    Removed Dec 2017/Jan 2018  . COPD (chronic obstructive pulmonary disease) ([place])   . Essential hypertension   . H. pylori infection 11/2016   With documented eradication via biopsy Feb 2019  . History of atrial fibrillation    November 2017  . Hyperlipidemia   . Sleep apnea    Did not tolerate CPAP  . Tubulovillous adenoma of rectum 04/2010    Past Surgical History:  Procedure Laterality Date  . CATARACT EXTRACTION W/PHACO Right 01/16/2016   Procedure: CATARACT EXTRACTION PHACO AND INTRAOCULAR LENS PLACEMENT RIGHT EYE CDE= 6.97;  Surgeon: Tonny Branch, MD;  Location: [facility];  Service: Ophthalmology;  Laterality: Right;  right  . CATARACT EXTRACTION W/PHACO Left 01/30/2016   Procedure: CATARACT EXTRACTION PHACO AND INTRAOCULAR LENS PLACEMENT LEFT EYE CDE 3.13;  Surgeon: Tonny Branch, MD;  Location: [facility];  Service: Ophthalmology;  Laterality: Left;  left  . COLONOSCOPY  2012  . COLONOSCOPY N/A 12/04/2016   three 4-6 mm tubular adenomas, moderate size external and internal hemorrhoids, redundant left  colon, 3 year surveillance  . ESOPHAGOGASTRODUODENOSCOPY N/A 12/04/2016   Dr. Oneida Alar: normal esophagus, small hiatal hernia, chronic gastritis with H.pylori, peptic duodenitis, atypical lymphoid proliferation. Surveillance EGD Feb 2019 without abnormal cells, negative H.pylori  . ESOPHAGOGASTRODUODENOSCOPY N/A 03/15/2017   mild gastritis, no atypical cells, negative H.pylori  . EXCISION, SKI  08/2016   R THIGH    Current Outpatient Medications  Medication Sig Dispense Refill  . aspirin EC 81 MG tablet Take 81 mg by mouth daily.    Marland Kitchen atorvastatin (LIPITOR) 10 MG tablet Take 1 tablet (10 mg total) by mouth daily at 6 PM. 90 tablet 3  . Azilsartan-Chlorthalidone 40-25 MG TABS Take 1 tablet by mouth daily. 90 tablet 3  . cholecalciferol (VITAMIN D) 1000 units tablet Take 1,000 Units by mouth daily.    . cilostazol (PLETAL) 100 MG tablet TAKE 1 TABLET BY MOUTH TWICE A DAY 180 tablet 1  . diltiazem (CARDIZEM CD) 240 MG 24 hr capsule TAKE 1 CAPSULE BY MOUTH EVERY DAY 90 capsule 1  . fish oil-omega-3 fatty acids 1000 MG capsule Take 1,000 mg by mouth daily.      . pantoprazole (PROTONIX) 40 MG tablet Take 1 tablet (40 mg total) by mouth daily before breakfast. 90 tablet 3   No current facility-administered medications for this visit.   Allergies:  Patient has no known allergies.   ROS: No palpitations or syncope.  Physical Exam: VS:  BP 118/82   Pulse (!) 59   Ht 6' (1.829 m)  Wt 207 lb (93.9 kg)   SpO2 98%   BMI 28.07 kg/m , BMI Body mass index is 28.07 kg/m.  Wt Readings from Last 3 Encounters:  12/12/19 207 lb (93.9 kg)  08/15/19 199 lb (90.3 kg)  05/16/19 214 lb 9.6 oz (97.3 kg)    General: Patient appears comfortable at rest. HEENT: Conjunctiva and lids normal, wearing a mask. Neck: Supple, no elevated JVP or carotid bruits, no thyromegaly. Lungs: Clear to auscultation, nonlabored breathing at rest. Cardiac: Regular rate and rhythm, no S3, 2/6 systolic murmur. Extremities:  No pitting edema.  ECG:  An ECG dated 08/15/2019 was personally reviewed today and demonstrated:  Sinus rhythm with LVH and nonspecific ST-T changes.  Recent Labwork: 08/15/2019: ALT 26; AST 32; BUN 23; Creat 1.41; Hemoglobin 13.1; Platelets 242; Potassium 3.6; Sodium 136; TSH 0.79     Component Value Date/Time   CHOL 131 08/15/2019 1002   CHOL 160 06/23/2018 1445   TRIG 163 (H) 08/15/2019 1002   HDL 28 (L) 08/15/2019 1002   HDL 29 (L) 06/23/2018 1445   CHOLHDL 4.7 08/15/2019 1002   VLDL 23 09/03/2016 0942   LDLCALC 77 08/15/2019 1002    Other Studies Reviewed Today:  Echocardiogram 06/07/2019: 1. Left ventricular ejection fraction, by estimation, is 60 to 65%. The  left ventricle has normal function. The left ventricle has no regional  wall motion abnormalities. Left ventricular diastolic parameters were  normal.  2. Right ventricular systolic function is normal. The right ventricular  size is normal. There is normal pulmonary artery systolic pressure.  3. The mitral valve is degenerative. Trivial mitral valve regurgitation.  4. On visual inspection, there appears to be at least mild calcific  aortic stenosis (albeit hemodynamics do not support this). The aortic  valve is tricuspid. Aortic valve regurgitation is not visualized. Aortic  valve area, by VTI measures 2.21 cm.  Aortic valve mean gradient measures 6.0 mmHg. Aortic valve Vmax measures  1.68 m/s.  5. The inferior vena cava is normal in size with greater than 50%  respiratory variability, suggesting right atrial pressure of 3 mmHg.   Comparison(s): Echocardiogram done 12/03/15 showed an EF of 65%.   Assessment and Plan:  1.  Remote history of atrial fibrillation in 2017 without recurrence is documented over time.  CHA2DS2-VASc score is 3, we have not pursued long-term anticoagulation as yet, this will be reconsidered if he has recurrent arrhythmia.  For now would continue aspirin and Cardizem CD.  2.  Mixed  hyperlipidemia, on Lipitor.  Recent LDL 77.  3.  Mild calcific aortic stenosis, asymptomatic.  Medication Adjustments/Labs and Tests Ordered: Current medicines are reviewed at length with the patient today.  Concerns regarding medicines are outlined above.   Tests Ordered: No orders of the defined types were placed in this encounter.   Medication Changes: No orders of the defined types were placed in this encounter.   Disposition:  Follow up 1 year in the [place] office.  Signed, Satira Sark, MD, [facility] 12/12/2019 8:55 AM    [facility] [place], [place], [place] [address] Phone: [phone]; Fax: [phone]

## 2019-12-12 ENCOUNTER — Ambulatory Visit (INDEPENDENT_AMBULATORY_CARE_PROVIDER_SITE_OTHER): Payer: Medicare HMO | Admitting: Cardiology

## 2019-12-12 ENCOUNTER — Encounter: Payer: Self-pay | Admitting: Cardiology

## 2019-12-12 ENCOUNTER — Other Ambulatory Visit: Payer: Self-pay

## 2019-12-12 VITALS — BP 118/82 | HR 59 | Ht 72.0 in | Wt 207.0 lb

## 2019-12-12 DIAGNOSIS — I350 Nonrheumatic aortic (valve) stenosis: Secondary | ICD-10-CM

## 2019-12-12 DIAGNOSIS — Z8679 Personal history of other diseases of the circulatory system: Secondary | ICD-10-CM

## 2019-12-12 DIAGNOSIS — E782 Mixed hyperlipidemia: Secondary | ICD-10-CM | POA: Diagnosis not present

## 2019-12-12 NOTE — Patient Instructions (Addendum)

## 2020-03-01 ENCOUNTER — Other Ambulatory Visit: Payer: Self-pay | Admitting: Family Medicine

## 2020-03-01 DIAGNOSIS — I10 Essential (primary) hypertension: Secondary | ICD-10-CM

## 2020-03-13 ENCOUNTER — Other Ambulatory Visit: Payer: Self-pay | Admitting: Family Medicine

## 2020-03-27 ENCOUNTER — Other Ambulatory Visit: Payer: Self-pay | Admitting: Family Medicine

## 2020-03-27 DIAGNOSIS — I10 Essential (primary) hypertension: Secondary | ICD-10-CM

## 2020-03-27 DIAGNOSIS — E782 Mixed hyperlipidemia: Secondary | ICD-10-CM

## 2020-03-29 ENCOUNTER — Other Ambulatory Visit: Payer: Self-pay | Admitting: Family Medicine

## 2020-03-29 DIAGNOSIS — I10 Essential (primary) hypertension: Secondary | ICD-10-CM

## 2020-03-29 NOTE — Telephone Encounter (Signed)
Dettinger. NTBS last OV 03/09/19 30 days given 03/01/20

## 2020-04-18 ENCOUNTER — Other Ambulatory Visit: Payer: Self-pay | Admitting: Family Medicine

## 2020-04-18 DIAGNOSIS — I10 Essential (primary) hypertension: Secondary | ICD-10-CM

## 2020-04-18 NOTE — Telephone Encounter (Signed)
Dettinger NTBS 30 days given 03/01/20 Last OV 03/09/19

## 2020-04-29 ENCOUNTER — Ambulatory Visit (INDEPENDENT_AMBULATORY_CARE_PROVIDER_SITE_OTHER): Payer: Medicare Other | Admitting: Nurse Practitioner

## 2020-04-29 DIAGNOSIS — J40 Bronchitis, not specified as acute or chronic: Secondary | ICD-10-CM | POA: Diagnosis not present

## 2020-04-29 MED ORDER — PSEUDOEPH-BROMPHEN-DM 30-2-10 MG/5ML PO SYRP: 5.0000 mL | ORAL_SOLUTION | Freq: Four times a day (QID) | ORAL | 0 refills | Status: DC | PRN

## 2020-04-29 MED ORDER — AZITHROMYCIN 250 MG PO TABS: ORAL_TABLET | ORAL | 0 refills | Status: DC

## 2020-04-29 NOTE — Progress Notes (Signed)
Virtual Visit  Note Due to COVID-19 pandemic this visit was conducted virtually. This visit type was conducted due to national recommendations for restrictions regarding the COVID-19 Pandemic (e.g. social distancing, sheltering in place) in an effort to limit this patient's exposure and mitigate transmission in our community. All issues noted in this document were discussed and addressed.  A physical exam was not performed with this format.  I connected with Christopher Lang on 04/29/20 at 4:53 by telephone and verified that I am speaking with the correct person using two identifiers. Christopher Lang is currently located at home and no one is currently with  im during visit. The provider, Mary-Margaret Hassell Done, FNP is located in their office at time of visit.  I discussed the limitations, risks, security and privacy concerns of performing an evaluation and management service by telephone and the availability of in person appointments. I also discussed with the patient that there may be a patient responsible charge related to this service. The patient expressed understanding and agreed to proceed.   History and Present Illness:   Chief Complaint: URI   HPI Patient calls in stating that wife had the fu last week. He was not treated. He now has a bad cough and he has headache and congestion. strated about 7 days ago.    Review of Systems  Constitutional: Negative for chills and fever.  HENT: Positive for congestion and sinus pain. Negative for sore throat.   Respiratory: Positive for cough. Negative for sputum production and shortness of breath.   Musculoskeletal: Negative for myalgias.  Neurological: Positive for headaches.     Observations/Objective: Alert and oriented- answers all questions appropriately No distress Deep dry cough noted during visit.  Assessment and Plan: Christopher Lang in today with chief complaint of URI   1. Bronchitis 1. Take meds as prescribed 2. Use a  cool mist humidifier especially during the winter months and when heat has been humid. 3. Use saline nose sprays frequently 4. Saline irrigations of the nose can be very helpful if done frequently.  * 4X daily for 1 week*  * Use of a nettie pot can be helpful with this. Follow directions with this* 5. Drink plenty of fluids 6. Keep thermostat turn down low 7.For any cough or congestion  As prescribed- keep check of blood pressure while on meds 8. For fever or aces or pains- take tylenol or ibuprofen appropriate for age and weight.  * for fevers greater than 101 orally you may alternate ibuprofen and tylenol every  3 hours.     - azithromycin (ZITHROMAX Z-PAK) 250 MG tablet; As directed  Dispense: 6 tablet; Refill: 0 - brompheniramine-pseudoephedrine-DM 30-2-10 MG/5ML syrup; Take 5 mLs by mouth 4 (four) times daily as needed.  Dispense: 120 mL; Refill: 0       Follow Up Instructions: prn    I discussed the assessment and treatment plan with the patient. The patient was provided an opportunity to ask questions and all were answered. The patient agreed with the plan and demonstrated an understanding of the instructions.   The patient was advised to call back or seek an in-person evaluation if the symptoms worsen or if the condition fails to improve as anticipated.  The above assessment and management plan was discussed with the patient. The patient verbalized understanding of and has agreed to the management plan. Patient is aware to call the clinic if symptoms persist or worsen. Patient is aware when to return to the  clinic for a follow-up visit. Patient educated on when it is appropriate to go to the emergency department.   Time call ended:  5:05  I provided 12 minutes of  non face-to-face time during this encounter.    [place], FNP \

## 2020-05-04 ENCOUNTER — Other Ambulatory Visit: Payer: Self-pay | Admitting: Family Medicine

## 2020-05-04 DIAGNOSIS — I10 Essential (primary) hypertension: Secondary | ICD-10-CM

## 2020-05-19 ENCOUNTER — Other Ambulatory Visit: Payer: Self-pay | Admitting: Cardiology

## 2020-05-19 ENCOUNTER — Other Ambulatory Visit: Payer: Self-pay | Admitting: Family Medicine

## 2020-05-19 DIAGNOSIS — I10 Essential (primary) hypertension: Secondary | ICD-10-CM

## 2020-05-19 DIAGNOSIS — E782 Mixed hyperlipidemia: Secondary | ICD-10-CM

## 2020-05-30 ENCOUNTER — Other Ambulatory Visit: Payer: Self-pay | Admitting: Family Medicine

## 2020-05-30 DIAGNOSIS — I10 Essential (primary) hypertension: Secondary | ICD-10-CM

## 2020-08-13 DIAGNOSIS — G4733 Obstructive sleep apnea (adult) (pediatric): Secondary | ICD-10-CM | POA: Diagnosis not present

## 2020-09-13 DIAGNOSIS — G4733 Obstructive sleep apnea (adult) (pediatric): Secondary | ICD-10-CM | POA: Diagnosis not present

## 2020-10-14 DIAGNOSIS — G4733 Obstructive sleep apnea (adult) (pediatric): Secondary | ICD-10-CM | POA: Diagnosis not present

## 2020-11-03 ENCOUNTER — Emergency Department (HOSPITAL_COMMUNITY): Payer: Medicare Other

## 2020-11-03 ENCOUNTER — Emergency Department (HOSPITAL_COMMUNITY): Payer: Medicare Other | Admitting: Anesthesiology

## 2020-11-03 ENCOUNTER — Inpatient Hospital Stay (HOSPITAL_COMMUNITY): Payer: Medicare Other

## 2020-11-03 ENCOUNTER — Inpatient Hospital Stay (HOSPITAL_COMMUNITY)
Admission: EM | Admit: 2020-11-03 | Discharge: 2020-11-08 | DRG: 023 | Disposition: A | Discharge status: Home Health | Payer: Medicare Other | Attending: Neurology | Admitting: Neurology

## 2020-11-03 ENCOUNTER — Encounter (HOSPITAL_COMMUNITY)
Admission: EM | Disposition: A | Discharge status: Home Health | Payer: Self-pay | Source: Home / Self Care | Attending: Neurology

## 2020-11-03 ENCOUNTER — Encounter (HOSPITAL_COMMUNITY): Payer: Self-pay | Admitting: Cardiology

## 2020-11-03 DIAGNOSIS — Z4659 Encounter for fitting and adjustment of other gastrointestinal appliance and device: Secondary | ICD-10-CM

## 2020-11-03 DIAGNOSIS — Z9282 Status post administration of tPA (rtPA) in a different facility within the last 24 hours prior to admission to current facility: Secondary | ICD-10-CM | POA: Diagnosis not present

## 2020-11-03 DIAGNOSIS — Z806 Family history of leukemia: Secondary | ICD-10-CM

## 2020-11-03 DIAGNOSIS — I63232 Cerebral infarction due to unspecified occlusion or stenosis of left carotid arteries: Secondary | ICD-10-CM | POA: Diagnosis not present

## 2020-11-03 DIAGNOSIS — J9600 Acute respiratory failure, unspecified whether with hypoxia or hypercapnia: Secondary | ICD-10-CM

## 2020-11-03 DIAGNOSIS — I9589 Other hypotension: Secondary | ICD-10-CM | POA: Diagnosis not present

## 2020-11-03 DIAGNOSIS — R29810 Facial weakness: Secondary | ICD-10-CM | POA: Diagnosis not present

## 2020-11-03 DIAGNOSIS — R531 Weakness: Secondary | ICD-10-CM

## 2020-11-03 DIAGNOSIS — I63231 Cerebral infarction due to unspecified occlusion or stenosis of right carotid arteries: Secondary | ICD-10-CM | POA: Diagnosis not present

## 2020-11-03 DIAGNOSIS — I63411 Cerebral infarction due to embolism of right middle cerebral artery: Principal | ICD-10-CM

## 2020-11-03 DIAGNOSIS — Z978 Presence of other specified devices: Secondary | ICD-10-CM

## 2020-11-03 DIAGNOSIS — R414 Neurologic neglect syndrome: Secondary | ICD-10-CM | POA: Diagnosis present

## 2020-11-03 DIAGNOSIS — I472 Ventricular tachycardia, unspecified: Secondary | ICD-10-CM | POA: Diagnosis not present

## 2020-11-03 DIAGNOSIS — Z833 Family history of diabetes mellitus: Secondary | ICD-10-CM

## 2020-11-03 DIAGNOSIS — I255 Ischemic cardiomyopathy: Secondary | ICD-10-CM | POA: Diagnosis not present

## 2020-11-03 DIAGNOSIS — I6601 Occlusion and stenosis of right middle cerebral artery: Secondary | ICD-10-CM | POA: Diagnosis not present

## 2020-11-03 DIAGNOSIS — I63511 Cerebral infarction due to unspecified occlusion or stenosis of right middle cerebral artery: Secondary | ICD-10-CM

## 2020-11-03 DIAGNOSIS — I10 Essential (primary) hypertension: Secondary | ICD-10-CM | POA: Diagnosis not present

## 2020-11-03 DIAGNOSIS — E785 Hyperlipidemia, unspecified: Secondary | ICD-10-CM | POA: Diagnosis not present

## 2020-11-03 DIAGNOSIS — I160 Hypertensive urgency: Secondary | ICD-10-CM | POA: Diagnosis not present

## 2020-11-03 DIAGNOSIS — J449 Chronic obstructive pulmonary disease, unspecified: Secondary | ICD-10-CM | POA: Diagnosis present

## 2020-11-03 DIAGNOSIS — F17210 Nicotine dependence, cigarettes, uncomplicated: Secondary | ICD-10-CM | POA: Diagnosis not present

## 2020-11-03 DIAGNOSIS — J811 Chronic pulmonary edema: Secondary | ICD-10-CM | POA: Diagnosis not present

## 2020-11-03 DIAGNOSIS — I700 Atherosclerosis of aorta: Secondary | ICD-10-CM | POA: Diagnosis not present

## 2020-11-03 DIAGNOSIS — Z8249 Family history of ischemic heart disease and other diseases of the circulatory system: Secondary | ICD-10-CM

## 2020-11-03 DIAGNOSIS — R29715 NIHSS score 15: Secondary | ICD-10-CM | POA: Diagnosis present

## 2020-11-03 DIAGNOSIS — I6523 Occlusion and stenosis of bilateral carotid arteries: Secondary | ICD-10-CM | POA: Diagnosis present

## 2020-11-03 DIAGNOSIS — Z20822 Contact with and (suspected) exposure to covid-19: Secondary | ICD-10-CM | POA: Diagnosis present

## 2020-11-03 DIAGNOSIS — H53462 Homonymous bilateral field defects, left side: Secondary | ICD-10-CM | POA: Diagnosis present

## 2020-11-03 DIAGNOSIS — G4733 Obstructive sleep apnea (adult) (pediatric): Secondary | ICD-10-CM | POA: Diagnosis not present

## 2020-11-03 DIAGNOSIS — G8194 Hemiplegia, unspecified affecting left nondominant side: Secondary | ICD-10-CM | POA: Diagnosis not present

## 2020-11-03 DIAGNOSIS — I5021 Acute systolic (congestive) heart failure: Secondary | ICD-10-CM | POA: Diagnosis not present

## 2020-11-03 DIAGNOSIS — I213 ST elevation (STEMI) myocardial infarction of unspecified site: Secondary | ICD-10-CM | POA: Diagnosis not present

## 2020-11-03 DIAGNOSIS — I5023 Acute on chronic systolic (congestive) heart failure: Secondary | ICD-10-CM | POA: Diagnosis not present

## 2020-11-03 DIAGNOSIS — I639 Cerebral infarction, unspecified: Secondary | ICD-10-CM | POA: Diagnosis not present

## 2020-11-03 DIAGNOSIS — I110 Hypertensive heart disease with heart failure: Secondary | ICD-10-CM | POA: Diagnosis present

## 2020-11-03 DIAGNOSIS — D72828 Other elevated white blood cell count: Secondary | ICD-10-CM | POA: Diagnosis present

## 2020-11-03 DIAGNOSIS — E782 Mixed hyperlipidemia: Secondary | ICD-10-CM | POA: Diagnosis not present

## 2020-11-03 DIAGNOSIS — I2109 ST elevation (STEMI) myocardial infarction involving other coronary artery of anterior wall: Secondary | ICD-10-CM

## 2020-11-03 DIAGNOSIS — I480 Paroxysmal atrial fibrillation: Secondary | ICD-10-CM | POA: Diagnosis not present

## 2020-11-03 DIAGNOSIS — I6522 Occlusion and stenosis of left carotid artery: Secondary | ICD-10-CM | POA: Diagnosis not present

## 2020-11-03 DIAGNOSIS — I6503 Occlusion and stenosis of bilateral vertebral arteries: Secondary | ICD-10-CM | POA: Diagnosis not present

## 2020-11-03 DIAGNOSIS — I63311 Cerebral infarction due to thrombosis of right middle cerebral artery: Secondary | ICD-10-CM | POA: Diagnosis not present

## 2020-11-03 DIAGNOSIS — Z7982 Long term (current) use of aspirin: Secondary | ICD-10-CM

## 2020-11-03 DIAGNOSIS — Z823 Family history of stroke: Secondary | ICD-10-CM

## 2020-11-03 DIAGNOSIS — I6521 Occlusion and stenosis of right carotid artery: Secondary | ICD-10-CM | POA: Diagnosis not present

## 2020-11-03 DIAGNOSIS — Z23 Encounter for immunization: Secondary | ICD-10-CM

## 2020-11-03 DIAGNOSIS — Z79899 Other long term (current) drug therapy: Secondary | ICD-10-CM

## 2020-11-03 DIAGNOSIS — I2102 ST elevation (STEMI) myocardial infarction involving left anterior descending coronary artery: Secondary | ICD-10-CM | POA: Diagnosis not present

## 2020-11-03 DIAGNOSIS — I252 Old myocardial infarction: Secondary | ICD-10-CM

## 2020-11-03 DIAGNOSIS — R001 Bradycardia, unspecified: Secondary | ICD-10-CM | POA: Diagnosis not present

## 2020-11-03 HISTORY — DX: Weakness: R53.1

## 2020-11-03 HISTORY — DX: ST elevation (STEMI) myocardial infarction of unspecified site: I21.3

## 2020-11-03 HISTORY — PX: RADIOLOGY WITH ANESTHESIA: SHX6223

## 2020-11-03 HISTORY — PX: IR PERCUTANEOUS ART THROMBECTOMY/INFUSION INTRACRANIAL INC DIAG ANGIO: IMG6087

## 2020-11-03 HISTORY — PX: IR CT HEAD LTD: IMG2386

## 2020-11-03 HISTORY — PX: IR US GUIDE VASC ACCESS RIGHT: IMG2390

## 2020-11-03 HISTORY — PX: IR US GUIDE VASC ACCESS LEFT: IMG2389

## 2020-11-03 HISTORY — DX: Cerebral infarction, unspecified: I63.9

## 2020-11-03 LAB — POCT I-STAT 7, (LYTES, BLD GAS, ICA,H+H)
Acid-base deficit: 2 mmol/L (ref 0.0–2.0)
Bicarbonate: 27.1 mmol/L (ref 20.0–28.0)
Calcium, Ion: 1.31 mmol/L (ref 1.15–1.40)
HCT: 48 % (ref 39.0–52.0)
Hemoglobin: 16.3 g/dL (ref 13.0–17.0)
O2 Saturation: 95 %
Patient temperature: 97.9
Potassium: 4.3 mmol/L (ref 3.5–5.1)
Sodium: 136 mmol/L (ref 135–145)
TCO2: 29 mmol/L (ref 22–32)
pCO2 arterial: 60.2 mmHg — ABNORMAL HIGH (ref 32.0–48.0)
pH, Arterial: 7.258 — ABNORMAL LOW (ref 7.350–7.450)
pO2, Arterial: 88 mmHg (ref 83.0–108.0)

## 2020-11-03 LAB — I-STAT CHEM 8, ED
BUN: 13 mg/dL (ref 8–23)
Calcium, Ion: 1.24 mmol/L (ref 1.15–1.40)
Chloride: 98 mmol/L (ref 98–111)
Creatinine, Ser: 0.7 mg/dL (ref 0.61–1.24)
Glucose, Bld: 201 mg/dL — ABNORMAL HIGH (ref 70–99)
HCT: 51 % (ref 39.0–52.0)
Hemoglobin: 17.3 g/dL — ABNORMAL HIGH (ref 13.0–17.0)
Potassium: 3.8 mmol/L (ref 3.5–5.1)
Sodium: 136 mmol/L (ref 135–145)
TCO2: 25 mmol/L (ref 22–32)

## 2020-11-03 LAB — HEMOGLOBIN A1C
Hgb A1c MFr Bld: 4.9 % (ref 4.8–5.6)
Mean Plasma Glucose: 93.93 mg/dL

## 2020-11-03 LAB — RESP PANEL BY RT-PCR (FLU A&B, COVID) ARPGX2
Influenza A by PCR: NEGATIVE
Influenza B by PCR: NEGATIVE
SARS Coronavirus 2 by RT PCR: NEGATIVE

## 2020-11-03 LAB — ECHOCARDIOGRAM COMPLETE
AR max vel: 2.05 cm2
AV Area VTI: 1.8 cm2
AV Area mean vel: 1.83 cm2
AV Mean grad: 4 mmHg
AV Peak grad: 7.6 mmHg
Ao pk vel: 1.38 m/s
Area-P 1/2: 3.66 cm2
Height: 72 in
MV VTI: 1.7 cm2
S' Lateral: 3.1 cm
Weight: 3089.97 oz

## 2020-11-03 LAB — DIFFERENTIAL
Abs Immature Granulocytes: 0.08 10*3/uL — ABNORMAL HIGH (ref 0.00–0.07)
Basophils Absolute: 0 10*3/uL (ref 0.0–0.1)
Basophils Relative: 0 %
Eosinophils Absolute: 0 10*3/uL (ref 0.0–0.5)
Eosinophils Relative: 0 %
Immature Granulocytes: 1 %
Lymphocytes Relative: 4 %
Lymphs Abs: 0.7 10*3/uL (ref 0.7–4.0)
Monocytes Absolute: 0.6 10*3/uL (ref 0.1–1.0)
Monocytes Relative: 4 %
Neutro Abs: 13.8 10*3/uL — ABNORMAL HIGH (ref 1.7–7.7)
Neutrophils Relative %: 91 %

## 2020-11-03 LAB — CBC
HCT: 47.1 % (ref 39.0–52.0)
Hemoglobin: 16.8 g/dL (ref 13.0–17.0)
MCH: 33.5 pg (ref 26.0–34.0)
MCHC: 35.7 g/dL (ref 30.0–36.0)
MCV: 93.8 fL (ref 80.0–100.0)
Platelets: 210 10*3/uL (ref 150–400)
RBC: 5.02 MIL/uL (ref 4.22–5.81)
RDW: 12.1 % (ref 11.5–15.5)
WBC: 15.1 10*3/uL — ABNORMAL HIGH (ref 4.0–10.5)
nRBC: 0 % (ref 0.0–0.2)

## 2020-11-03 LAB — COMPREHENSIVE METABOLIC PANEL
ALT: 30 U/L (ref 0–44)
AST: 83 U/L — ABNORMAL HIGH (ref 15–41)
Albumin: 4 g/dL (ref 3.5–5.0)
Alkaline Phosphatase: 131 U/L — ABNORMAL HIGH (ref 38–126)
Anion gap: 10 (ref 5–15)
BUN: 11 mg/dL (ref 8–23)
CO2: 25 mmol/L (ref 22–32)
Calcium: 10.2 mg/dL (ref 8.9–10.3)
Chloride: 98 mmol/L (ref 98–111)
Creatinine, Ser: 0.93 mg/dL (ref 0.61–1.24)
GFR, Estimated: >60 mL/min (ref >60)
Glucose, Bld: 194 mg/dL — ABNORMAL HIGH (ref 70–99)
Potassium: 3.8 mmol/L (ref 3.5–5.1)
Sodium: 133 mmol/L — ABNORMAL LOW (ref 135–145)
Total Bilirubin: 1.8 mg/dL — ABNORMAL HIGH (ref 0.3–1.2)
Total Protein: 7.5 g/dL (ref 6.5–8.1)

## 2020-11-03 LAB — APTT: aPTT: 39 seconds — ABNORMAL HIGH (ref 24–36)

## 2020-11-03 LAB — GLUCOSE, CAPILLARY
Glucose-Capillary: 102 mg/dL — ABNORMAL HIGH (ref 70–99)
Glucose-Capillary: 109 mg/dL — ABNORMAL HIGH (ref 70–99)
Glucose-Capillary: 113 mg/dL — ABNORMAL HIGH (ref 70–99)
Glucose-Capillary: 156 mg/dL — ABNORMAL HIGH (ref 70–99)

## 2020-11-03 LAB — CBG MONITORING, ED: Glucose-Capillary: 208 mg/dL — ABNORMAL HIGH (ref 70–99)

## 2020-11-03 LAB — PROTIME-INR
INR: 1 (ref 0.8–1.2)
Prothrombin Time: 13.6 seconds (ref 11.4–15.2)

## 2020-11-03 LAB — TROPONIN I (HIGH SENSITIVITY): Troponin I (High Sensitivity): >24000 ng/L (ref <18)

## 2020-11-03 LAB — MRSA NEXT GEN BY PCR, NASAL: MRSA by PCR Next Gen: NOT DETECTED

## 2020-11-03 SURGERY — RADIOLOGY WITH ANESTHESIA
Anesthesia: General

## 2020-11-03 SURGERY — CORONARY/GRAFT ACUTE MI REVASCULARIZATION
Anesthesia: LOCAL

## 2020-11-03 MED ORDER — VITAMIN D 25 MCG (1000 UNIT) PO TABS
1000.0000 [IU] | ORAL_TABLET | Freq: Every day | ORAL | Status: DC
  Administered 2020-11-04 – 2020-11-08 (×4): 1000 [IU] via ORAL
  Filled 2020-11-03 (×5): qty 1

## 2020-11-03 MED ORDER — PHENYLEPHRINE HCL (PRESSORS) 10 MG/ML IV SOLN
INTRAVENOUS | Status: DC | PRN
  Administered 2020-11-03: 100 ug via INTRAVENOUS
  Administered 2020-11-03 (×3): 80 ug via INTRAVENOUS

## 2020-11-03 MED ORDER — ROCURONIUM BROMIDE 100 MG/10ML IV SOLN
INTRAVENOUS | Status: DC | PRN
  Administered 2020-11-03: 100 mg via INTRAVENOUS
  Administered 2020-11-03: 50 mg via INTRAVENOUS

## 2020-11-03 MED ORDER — LIDOCAINE 2% (20 MG/ML) 5 ML SYRINGE
INTRAMUSCULAR | Status: DC | PRN
  Administered 2020-11-03: 30 mg via INTRAVENOUS

## 2020-11-03 MED ORDER — ORAL CARE MOUTH RINSE: 15.0000 mL | Freq: Two times a day (BID) | OROMUCOSAL | Status: DC

## 2020-11-03 MED ORDER — SUCCINYLCHOLINE CHLORIDE 200 MG/10ML IV SOSY
PREFILLED_SYRINGE | INTRAVENOUS | Status: DC | PRN
  Administered 2020-11-03: 100 mg via INTRAVENOUS

## 2020-11-03 MED ORDER — CLEVIDIPINE BUTYRATE 0.5 MG/ML IV EMUL: 0.0000 mg/h | INTRAVENOUS | Status: DC

## 2020-11-03 MED ORDER — CHLORHEXIDINE GLUCONATE 0.12% ORAL RINSE (MEDLINE KIT)
15.0000 mL | Freq: Two times a day (BID) | OROMUCOSAL | Status: DC
  Administered 2020-11-03: 15 mL via OROMUCOSAL

## 2020-11-03 MED ORDER — ACETAMINOPHEN 160 MG/5ML PO SOLN: 650.0000 mg | ORAL | Status: DC | PRN

## 2020-11-03 MED ORDER — SODIUM CHLORIDE 0.9 % IV SOLN: INTRAVENOUS | Status: DC

## 2020-11-03 MED ORDER — CLEVIDIPINE BUTYRATE 0.5 MG/ML IV EMUL
0.0000 mg/h | INTRAVENOUS | Status: DC
  Administered 2020-11-03: 8 mg/h via INTRAVENOUS
  Administered 2020-11-03 – 2020-11-04 (×2): 4 mg/h via INTRAVENOUS
  Filled 2020-11-03 (×2): qty 50

## 2020-11-03 MED ORDER — CLEVIDIPINE BUTYRATE 0.5 MG/ML IV EMUL
INTRAVENOUS | Status: DC | PRN
  Administered 2020-11-03: 1 mg/h via INTRAVENOUS

## 2020-11-03 MED ORDER — SODIUM CHLORIDE 0.9 % IV SOLN: INTRAVENOUS | Status: DC | PRN

## 2020-11-03 MED ORDER — SENNOSIDES-DOCUSATE SODIUM 8.6-50 MG PO TABS: 1.0000 | ORAL_TABLET | Freq: Every evening | ORAL | Status: DC | PRN

## 2020-11-03 MED ORDER — ATORVASTATIN CALCIUM 10 MG PO TABS: 10.0000 mg | ORAL_TABLET | Freq: Every day | ORAL | Status: DC

## 2020-11-03 MED ORDER — CHLORHEXIDINE GLUCONATE 0.12 % MT SOLN: 15.0000 mL | Freq: Two times a day (BID) | OROMUCOSAL | Status: DC

## 2020-11-03 MED ORDER — FENTANYL CITRATE PF 50 MCG/ML IJ SOSY
25.0000 ug | PREFILLED_SYRINGE | INTRAMUSCULAR | Status: DC | PRN
  Administered 2020-11-03 (×2): 50 ug via INTRAVENOUS
  Filled 2020-11-03: qty 2
  Filled 2020-11-03: qty 1

## 2020-11-03 MED ORDER — CHLORHEXIDINE GLUCONATE CLOTH 2 % EX PADS
6.0000 | MEDICATED_PAD | Freq: Every day | CUTANEOUS | Status: DC
  Administered 2020-11-03 – 2020-11-07 (×4): 6 via TOPICAL

## 2020-11-03 MED ORDER — ACETAMINOPHEN 325 MG PO TABS: 650.0000 mg | ORAL_TABLET | ORAL | Status: DC | PRN

## 2020-11-03 MED ORDER — CLEVIDIPINE BUTYRATE 0.5 MG/ML IV EMUL
0.0000 mg/h | INTRAVENOUS | Status: DC
Start: 2020-11-03 — End: 2020-11-03

## 2020-11-03 MED ORDER — LABETALOL HCL 5 MG/ML IV SOLN
INTRAVENOUS | Status: AC
  Filled 2020-11-03: qty 4

## 2020-11-03 MED ORDER — HEPARIN (PORCINE) IN NACL 1000-0.9 UT/500ML-% IV SOLN
INTRAVENOUS | Status: AC
  Filled 2020-11-03: qty 1000

## 2020-11-03 MED ORDER — LIDOCAINE HCL (CARDIAC) PF 100 MG/5ML IV SOSY
PREFILLED_SYRINGE | INTRAVENOUS | Status: DC | PRN
  Administered 2020-11-03: 30 mg via INTRAVENOUS

## 2020-11-03 MED ORDER — METOPROLOL TARTRATE 25 MG PO TABS
25.0000 mg | ORAL_TABLET | Freq: Two times a day (BID) | ORAL | Status: DC
  Administered 2020-11-04 (×2): 25 mg via ORAL
  Filled 2020-11-03 (×3): qty 1

## 2020-11-03 MED ORDER — IOHEXOL 350 MG/ML SOLN
100.0000 mL | Freq: Once | INTRAVENOUS | Status: AC | PRN
  Administered 2020-11-03: 70 mL via INTRA_ARTERIAL

## 2020-11-03 MED ORDER — STROKE: EARLY STAGES OF RECOVERY BOOK
Freq: Once | Status: AC
  Filled 2020-11-03: qty 1

## 2020-11-03 MED ORDER — FENTANYL CITRATE PF 50 MCG/ML IJ SOSY: 25.0000 ug | PREFILLED_SYRINGE | INTRAMUSCULAR | Status: DC | PRN

## 2020-11-03 MED ORDER — PHENYLEPHRINE HCL-NACL 20-0.9 MG/250ML-% IV SOLN
INTRAVENOUS | Status: DC | PRN
  Administered 2020-11-03: 50 ug/min via INTRAVENOUS

## 2020-11-03 MED ORDER — PROPOFOL 500 MG/50ML IV EMUL
INTRAVENOUS | Status: DC | PRN
  Administered 2020-11-03: 50 ug/kg/min via INTRAVENOUS

## 2020-11-03 MED ORDER — INSULIN ASPART 100 UNIT/ML IJ SOLN
0.0000 [IU] | INTRAMUSCULAR | Status: DC
  Administered 2020-11-03: 3 [IU] via SUBCUTANEOUS
  Administered 2020-11-05: 2 [IU] via SUBCUTANEOUS

## 2020-11-03 MED ORDER — VITAMIN D 25 MCG (1000 UNIT) PO TABS: 1000.0000 [IU] | ORAL_TABLET | Freq: Every day | ORAL | Status: DC

## 2020-11-03 MED ORDER — SODIUM CHLORIDE 0.9% FLUSH: 3.0000 mL | Freq: Once | INTRAVENOUS | Status: DC

## 2020-11-03 MED ORDER — ORAL CARE MOUTH RINSE
15.0000 mL | Freq: Two times a day (BID) | OROMUCOSAL | Status: DC
  Administered 2020-11-04 (×2): 15 mL via OROMUCOSAL

## 2020-11-03 MED ORDER — METOPROLOL TARTRATE 5 MG/5ML IV SOLN: 5.0000 mg | Freq: Four times a day (QID) | INTRAVENOUS | Status: DC | PRN

## 2020-11-03 MED ORDER — METOPROLOL TARTRATE 25 MG PO TABS
25.0000 mg | ORAL_TABLET | Freq: Two times a day (BID) | ORAL | Status: DC
  Administered 2020-11-03: 25 mg
  Filled 2020-11-03: qty 1

## 2020-11-03 MED ORDER — FENTANYL CITRATE (PF) 100 MCG/2ML IJ SOLN
INTRAMUSCULAR | Status: AC
  Filled 2020-11-03: qty 2

## 2020-11-03 MED ORDER — LABETALOL HCL 5 MG/ML IV SOLN
20.0000 mg | Freq: Once | INTRAVENOUS | Status: DC
  Filled 2020-11-03: qty 4

## 2020-11-03 MED ORDER — IOHEXOL 350 MG/ML SOLN
100.0000 mL | Freq: Once | INTRAVENOUS | Status: AC | PRN
  Administered 2020-11-03: 100 mL via INTRAVENOUS

## 2020-11-03 MED ORDER — VITAMIN D 25 MCG (1000 UNIT) PO TABS
1000.0000 [IU] | ORAL_TABLET | Freq: Every day | ORAL | Status: DC
  Administered 2020-11-03: 1000 [IU]
  Filled 2020-11-03: qty 1

## 2020-11-03 MED ORDER — CLEVIDIPINE BUTYRATE 0.5 MG/ML IV EMUL
INTRAVENOUS | Status: AC
  Filled 2020-11-03: qty 50

## 2020-11-03 MED ORDER — PANTOPRAZOLE SODIUM 40 MG IV SOLR
40.0000 mg | Freq: Every day | INTRAVENOUS | Status: DC
  Administered 2020-11-03: 40 mg via INTRAVENOUS
  Filled 2020-11-03: qty 40

## 2020-11-03 MED ORDER — VERAPAMIL HCL 2.5 MG/ML IV SOLN
INTRAVENOUS | Status: AC
  Filled 2020-11-03: qty 2

## 2020-11-03 MED ORDER — POLYETHYLENE GLYCOL 3350 17 G PO PACK
17.0000 g | PACK | Freq: Every day | ORAL | Status: DC
  Filled 2020-11-03: qty 1

## 2020-11-03 MED ORDER — LABETALOL HCL 5 MG/ML IV SOLN
20.0000 mg | Freq: Once | INTRAVENOUS | Status: AC
  Administered 2020-11-03: 20 mg via INTRAVENOUS

## 2020-11-03 MED ORDER — ORAL CARE MOUTH RINSE
15.0000 mL | OROMUCOSAL | Status: DC
  Administered 2020-11-03 (×2): 15 mL via OROMUCOSAL

## 2020-11-03 MED ORDER — LIDOCAINE HCL (PF) 1 % IJ SOLN
INTRAMUSCULAR | Status: AC
  Filled 2020-11-03: qty 30

## 2020-11-03 MED ORDER — ACETAMINOPHEN 650 MG RE SUPP: 650.0000 mg | RECTAL | Status: DC | PRN

## 2020-11-03 MED ORDER — PROPOFOL 10 MG/ML IV BOLUS
INTRAVENOUS | Status: DC | PRN
  Administered 2020-11-03: 150 mg via INTRAVENOUS

## 2020-11-03 MED ORDER — FENTANYL CITRATE (PF) 100 MCG/2ML IJ SOLN
INTRAMUSCULAR | Status: DC | PRN
  Administered 2020-11-03: 100 ug via INTRAVENOUS

## 2020-11-03 MED ORDER — CHLORHEXIDINE GLUCONATE 0.12 % MT SOLN
15.0000 mL | Freq: Two times a day (BID) | OROMUCOSAL | Status: DC
  Administered 2020-11-03 – 2020-11-05 (×4): 15 mL via OROMUCOSAL
  Filled 2020-11-03: qty 15

## 2020-11-03 MED ORDER — TENECTEPLASE FOR STROKE
0.2500 mg/kg | PACK | Freq: Once | INTRAVENOUS | Status: AC
  Administered 2020-11-03: 22 mg via INTRAVENOUS
  Filled 2020-11-03: qty 4.4

## 2020-11-03 MED ORDER — PROPOFOL 1000 MG/100ML IV EMUL
0.0000 ug/kg/min | INTRAVENOUS | Status: DC
  Administered 2020-11-03: 30 ug/kg/min via INTRAVENOUS
  Administered 2020-11-03: 50 ug/kg/min via INTRAVENOUS
  Filled 2020-11-03 (×2): qty 100

## 2020-11-03 NOTE — ED Triage Notes (Signed)
BIB EMS from [facility] for stroke like symptoms and STEMI symptoms. Last known well time 0230 wife saw pt go to restroom. 4370 pt started c/o chest symptoms. Wife gave ASA 81mg . 20ga in Rt hand. 18 ga Lt AC, HTN 157/101 lt arm, 198/74 rt arm. Lt leg and arm unable to move, slurred speech, gazing to the right, oriented now, confused on EMS arrival, hx of A-fib

## 2020-11-03 NOTE — Code Documentation (Addendum)
Responded to Code Stroke called on pt that was just admitted to ED as Code STEMI. L sided weakness, L facial droop, R sided gaze preference, L hemianopia, slurred speech, and neglect present on arrival so Code Stroke was activated at 0621. LSN-0230, CBG-208, NIH-10, CTH-dense M1 seg suspicious for thrombosis, no hemorrhage, CTA-R M1 occlusion. TNK given at 0640 after pt received 20mg  labetolol at 0636 for BP-181/119. BP 170/98 prior to pushing TNK. IR paged out at [place] and pt arrived in IR suite at [phone]. Plan to admit to ICU post IR.

## 2020-11-03 NOTE — ED Notes (Signed)
Pt placed on cardiac monitor at the bridge.

## 2020-11-03 NOTE — Progress Notes (Signed)
Assisted with code stroke; helped transport pt to IR, performed groin prep.

## 2020-11-03 NOTE — Progress Notes (Signed)
  Echocardiogram 2D Echocardiogram has been performed.  Christopher Lang 11/03/2020, 4:25 PM

## 2020-11-03 NOTE — ED Notes (Signed)
Arrived at IR at this time

## 2020-11-03 NOTE — Progress Notes (Signed)
Report given to Amy, RN via telephone and at beside. Pt stable at handoff, RN and Dr Elsworth Soho at bedside along with RT. Groin level 0.

## 2020-11-03 NOTE — Progress Notes (Signed)
Report received from ED RN(s). Unable to assess pt NIH d/t pt intubated and sedated, under the care of anesthesia at this time.

## 2020-11-03 NOTE — Progress Notes (Signed)
Anesthesia present for case 

## 2020-11-03 NOTE — Progress Notes (Signed)
Transport pt from IR to room. No noted respiratory issues.

## 2020-11-03 NOTE — Anesthesia Procedure Notes (Signed)
Procedure Name: Intubation Date/Time: 11/03/2020 7:40 AM Performed by: Eligha Bridegroom, CRNA Pre-anesthesia Checklist: Patient identified, Emergency Drugs available, Suction available, Patient being monitored and Timeout performed Patient Re-evaluated:Patient Re-evaluated prior to induction Oxygen Delivery Method: Circle system utilized Preoxygenation: Pre-oxygenation with 100% oxygen Induction Type: IV induction, Rapid sequence and Cricoid Pressure applied Laryngoscope Size: Mac and 4 Grade View: Grade I Tube type: Oral Tube size: 8.0 mm Number of attempts: 1 Placement Confirmation: ETT inserted through vocal cords under direct vision, positive ETCO2 and breath sounds checked- equal and bilateral Secured at: 22 cm Dental Injury: Teeth and Oropharynx as per pre-operative assessment

## 2020-11-03 NOTE — Progress Notes (Signed)
Unable to assess NIH, pt intubated and sedated

## 2020-11-03 NOTE — ED Notes (Signed)
CBG of 208

## 2020-11-03 NOTE — ED Provider Notes (Signed)
[facility] EMERGENCY DEPARTMENT Provider Note  CSN: [id number] Arrival date & time: 11/03/20 2703  Chief Complaint(s) Code Stroke  HPI Christopher Lang is a 65 y.o. male with a past medical history listed below who presents to the emergency department as Code STEMI and possible CODE STROKE. History obtained by EMS. Patient was at his normal state of health up until this morning. Last known well was around 2:30 in the morning when patient got up to use the restroom. Around 4:55p patient's wife called EMS stating that the patient was complaining of chest pain and of like he was having a seizure.  Patient was reportedly only moving right side of his body. Upon EMS arrival patient was relatively altered which resolved in route.  They noted left-sided weakness.  They obtained an EKG which showed ST segment elevation in the lateral precordial leads. Patient noted to be hypertensive.  Remainder of history, ROS, and physical exam limited due to patient's condition (acuity of condition).   Level V Caveat.   The history is provided by the EMS personnel.   Past Medical History Past Medical History:  Diagnosis Date   Acute CVA (cerebrovascular accident) ([place]) 11/03/2020   Acute left-sided weakness 11/03/2020   Acute ST elevation myocardial infarction (STEMI) ([place]) 11/03/2020   Cataract    Removed Dec 2017/Jan 2018   COPD (chronic obstructive pulmonary disease) ([place])    Essential hypertension    H. pylori infection 11/2016   With documented eradication via biopsy Feb 2019   History of atrial fibrillation    November 2017   Hyperlipidemia    Sleep apnea    Did not tolerate CPAP   Tubulovillous adenoma of rectum 04/2010   Patient Active Problem List   Diagnosis Date Noted   Acute ST elevation myocardial infarction (STEMI) ([place]) 11/03/2020   Acute CVA (cerebrovascular accident) ([place]) 11/03/2020   Acute left-sided weakness 11/03/2020   Stroke ([place]) 11/03/2020   Acute  ischemic right MCA stroke ([place]) 11/03/2020   Rectal itching 04/27/2017   Abnormal findings on examination of gastrointestinal tract    Gastritis due to Helicobacter species 50/09/3816   Anemia    Normocytic anemia 10/28/2016   Atherosclerosis of aorta ([place]) 09/29/2016   Tubular adenoma of colon 09/03/2016   Smokes with greater than 30 pack year history 09/03/2016   Atrial fibrillation ([place]) 12/02/2015   Primary hypertension 12/02/2015   Hyperlipidemia 12/02/2015   COPD (chronic obstructive pulmonary disease) ([place]) 12/02/2015   Tobacco abuse 12/02/2015   Home Medication(s) Prior to Admission medications   Medication Sig Start Date End Date Taking? Authorizing Provider  aspirin EC 81 MG tablet Take 81 mg by mouth daily.   Yes [provider]  atorvastatin (LIPITOR) 10 MG tablet Take 1 tablet (10 mg total) by mouth daily at 6 PM. 03/09/19  Yes Dettinger, Fransisca Kaufmann, MD  cholecalciferol (VITAMIN D) 1000 units tablet Take 1,000 Units by mouth daily.   Yes [provider]  cilostazol (PLETAL) 100 MG tablet TAKE 1 TABLET BY MOUTH TWICE A DAY Patient taking differently: Take 100 mg by mouth 2 (two) times daily. 05/20/20  Yes Verta Ellen., NP  diltiazem (CARDIZEM CD) 240 MG 24 hr capsule TAKE 1 CAPSULE BY MOUTH EVERY DAY Patient taking differently: Take 240 mg by mouth daily. 05/20/20  Yes Satira Sark, MD  fish oil-omega-3 fatty acids 1000 MG capsule Take 1,000 mg by mouth daily.     Yes [provider]  pantoprazole (  PROTONIX) 40 MG tablet Take 1 tablet (40 mg total) by mouth daily before breakfast. Patient not taking: No sig reported 03/09/19   Dettinger, Fransisca Kaufmann, MD                                                                                                                                    Past Surgical History Past Surgical History:  Procedure Laterality Date   CATARACT EXTRACTION W/PHACO Right 01/16/2016   Procedure: CATARACT EXTRACTION PHACO AND  INTRAOCULAR LENS PLACEMENT RIGHT EYE CDE= 6.97;  Surgeon: Tonny Branch, MD;  Location: [facility];  Service: Ophthalmology;  Laterality: Right;  right   CATARACT EXTRACTION W/PHACO Left 01/30/2016   Procedure: CATARACT EXTRACTION PHACO AND INTRAOCULAR LENS PLACEMENT LEFT EYE CDE 3.13;  Surgeon: Tonny Branch, MD;  Location: [facility];  Service: Ophthalmology;  Laterality: Left;  left   COLONOSCOPY  2012   COLONOSCOPY N/A 12/04/2016   three 4-6 mm tubular adenomas, moderate size external and internal hemorrhoids, redundant left colon, 3 year surveillance   ESOPHAGOGASTRODUODENOSCOPY N/A 12/04/2016   Dr. Oneida Alar: normal esophagus, small hiatal hernia, chronic gastritis with H.pylori, peptic duodenitis, atypical lymphoid proliferation. Surveillance EGD Feb 2019 without abnormal cells, negative H.pylori   ESOPHAGOGASTRODUODENOSCOPY N/A 03/15/2017   mild gastritis, no atypical cells, negative H.pylori   EXCISION, SKI  08/2016   R THIGH   Family History Family History  Problem Relation Age of Onset   CAD Mother    High blood pressure Mother    Heart attack Mother    Leukemia Father    Early death Father    Heart attack Sister    Diabetes Sister    Cancer Brother    Deep vein thrombosis Brother    Hypertension Daughter    Stroke Sister    Stroke Sister    Heart attack Sister    Deep vein thrombosis Sister    Hypertension Sister    Diabetes Sister    Arthritis Sister    Arthritis Sister    Arthritis Sister    Hypertension Sister    Heart attack Sister    Cancer Brother        KIDNEY   Kidney disease Brother    Deep vein thrombosis Brother    Seizures Brother    Heart attack Brother    Colon cancer Neg Hx    Colon polyps Neg Hx     Social History Social History   Tobacco Use   Smoking status: Every Day    Packs/day: 1.00    Years: 45.00    Pack years: 45.00    Types: Cigarettes    Last attempt to quit: 02/28/2017    Years since quitting: 3.6   Smokeless tobacco: Never   Tobacco  comments:    started back - pack per day  Vaping Use   Vaping Use: Never used  Substance Use Topics   Alcohol use: Yes  Alcohol/week: 0.0 standard drinks    Comment: occas   Drug use: No   Allergies Patient has no known allergies.  Review of Systems Review of Systems  Unable to perform ROS: Acuity of condition   Physical Exam Vital Signs  I have reviewed the triage vital signs BP (!) 169/96   Pulse 87   Temp 97.9 F (36.6 C) (Temporal)   Resp 20   Wt 87.6 kg   SpO2 94%   BMI 26.19 kg/m   Physical Exam Vitals reviewed.  Constitutional:      General: He is not in acute distress.    Appearance: He is well-developed. He is not diaphoretic.  HENT:     Head: Normocephalic and atraumatic.     Nose: Nose normal.  Eyes:     General: No scleral icterus.       Right eye: No discharge.        Left eye: No discharge.     Conjunctiva/sclera: Conjunctivae normal.     Pupils: Pupils are equal, round, and reactive to light.  Cardiovascular:     Rate and Rhythm: Normal rate and regular rhythm.     Heart sounds: No murmur heard.   No friction rub. No gallop.  Pulmonary:     Effort: Pulmonary effort is normal. No respiratory distress.     Breath sounds: Normal breath sounds. No stridor. No rales.  Abdominal:     General: There is no distension.     Palpations: Abdomen is soft.     Tenderness: There is no abdominal tenderness.  Musculoskeletal:        General: No tenderness.     Cervical back: Normal range of motion and neck supple.  Skin:    General: Skin is warm and dry.     Findings: No erythema or rash.  Neurological:     Mental Status: He is alert.     Comments: Left facial droop. Left-sided neglect Left upper and lower extremity weakness.    ED Results and Treatments Labs (all labs ordered are listed, but only abnormal results are displayed) Labs Reviewed  APTT - Abnormal; Notable for the following components:      Result Value   aPTT 39 (*)    All other  components within normal limits  CBC - Abnormal; Notable for the following components:   WBC 15.1 (*)    All other components within normal limits  DIFFERENTIAL - Abnormal; Notable for the following components:   Neutro Abs 13.8 (*)    Abs Immature Granulocytes 0.08 (*)    All other components within normal limits  COMPREHENSIVE METABOLIC PANEL - Abnormal; Notable for the following components:   Sodium 133 (*)    Glucose, Bld 194 (*)    AST 83 (*)    Alkaline Phosphatase 131 (*)    Total Bilirubin 1.8 (*)    All other components within normal limits  I-STAT CHEM 8, ED - Abnormal; Notable for the following components:   Glucose, Bld 201 (*)    Hemoglobin 17.3 (*)    All other components within normal limits  CBG MONITORING, ED - Abnormal; Notable for the following components:   Glucose-Capillary 208 (*)    All other components within normal limits  RESP PANEL BY RT-PCR (FLU A&B, COVID) ARPGX2  PROTIME-INR  HIV ANTIBODY (ROUTINE TESTING W REFLEX)  EKG         Radiology CT HEAD CODE STROKE WO CONTRAST  Result Date: 11/03/2020 CLINICAL DATA:  Code stroke. EXAM: CT HEAD WITHOUT CONTRAST TECHNIQUE: Contiguous axial images were obtained from the base of the skull through the vertex without intravenous contrast. COMPARISON:  11/01/2013 FINDINGS: Brain: On coronal reformats there is equivocal cortical indistinctness of the right frontal operculum. No hemorrhage or hydrocephalus. Negative for mass. Patchy remote cortically based infarcts at the left vertex. Vascular: Hyperdense right M1 segment Skull: Normal. Negative for fracture or focal lesion. Sinuses/Orbits: No acute finding. Other: Page to Dr. Lorrin Goodell at 6:24 am on 11/03/2020 by text page via the [facility] messaging system. ASPECTS [facility] Stroke Program Early CT Score) - Ganglionic level infarction (caudate,  lentiform nuclei, internal capsule, insula, M1-M3 cortex): 6 or 7 - Supraganglionic infarction (M4-M6 cortex): 3 Total score (0-10 with 10 being normal): 9 or 10 IMPRESSION: 1. Dense right M1 segment suspicious for thrombosis. 2. Equivocal for acute infarct at the right insula/operculum. 3. No acute hemorrhage. 4. 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 Electronically Signed   By: Jorje Guild M.D.   On: 11/03/2020 06:27   CT ANGIO HEAD CODE STROKE  Result Date: 11/03/2020 CLINICAL DATA:  Assess intracranial arteries. EXAM: CT ANGIOGRAPHY HEAD AND NECK TECHNIQUE: Multidetector CT imaging of the head and neck was performed using the standard protocol during bolus administration of intravenous contrast. Multiplanar CT image reconstructions and MIPs were obtained to evaluate the vascular anatomy. Carotid stenosis measurements (when applicable) are obtained utilizing NASCET criteria, using the distal internal carotid diameter as the denominator. CONTRAST:  143mL OMNIPAQUE IOHEXOL 350 MG/ML SOLN COMPARISON:  None similar FINDINGS: CTA NECK FINDINGS Aortic arch: Atheromatous plaque.  Three vessel branching. Right carotid system: Primarily calcific atherosclerosis with severe ICA origin stenosis to the degree that the lumen is not accurately measurable. No dissection. Left carotid system: Atherosclerosis with bulky mixed density plaque at the left ICA bulb with severe stenosis causing downstream faint underfilling Vertebral arteries: Proximal subclavian atherosclerosis with up to 60% left origin stenosis. Dominant right vertebral artery with atherosclerotic calcification but wide patency to the dura. Faint intermittent flow in the left vertebral artery. Skeleton: Ordinary cervical spine degeneration Other neck: No acute finding Upper chest: Paraseptal emphysema. Review of the MIP images confirms the above findings CTA HEAD FINDINGS Anterior circulation: Heavily calcified carotid siphons with at least  moderate stenosis on the right. TOn the left there is quantification due to underfilling. Right M1 occlusion with faint downstream reconstitution. Major vessels on the left show better patency. Negative for aneurysm Posterior circulation: Vertebral and basilar arteries are widely patent. Fetal type bilateral PCA flow. No PCA branch occlusion. Venous sinuses: Unremarkable Anatomic variants: As above Review of the MIP images confirms the above findings IMPRESSION: 1. Emergent large vessel occlusion at the right M1 segment. 2. Severe bilateral ICA origin stenosis with underfilling left. 3. Thready intermittent flow in the non dominant left vertebral artery. 4. 60% left subclavian origin stenosis. Electronically Signed   By: Jorje Guild M.D.   On: 11/03/2020 06:41   CT ANGIO NECK CODE STROKE  Result Date: 11/03/2020 CLINICAL DATA:  Assess intracranial arteries. EXAM: CT ANGIOGRAPHY HEAD AND NECK TECHNIQUE: Multidetector CT imaging of the head and neck was performed using the standard protocol during bolus administration of intravenous contrast. Multiplanar CT image reconstructions and MIPs were obtained to evaluate the vascular anatomy. Carotid stenosis measurements (when applicable) are obtained utilizing NASCET criteria, using the  distal internal carotid diameter as the denominator. CONTRAST:  191mL OMNIPAQUE IOHEXOL 350 MG/ML SOLN COMPARISON:  None similar FINDINGS: CTA NECK FINDINGS Aortic arch: Atheromatous plaque.  Three vessel branching. Right carotid system: Primarily calcific atherosclerosis with severe ICA origin stenosis to the degree that the lumen is not accurately measurable. No dissection. Left carotid system: Atherosclerosis with bulky mixed density plaque at the left ICA bulb with severe stenosis causing downstream faint underfilling Vertebral arteries: Proximal subclavian atherosclerosis with up to 60% left origin stenosis. Dominant right vertebral artery with atherosclerotic calcification but  wide patency to the dura. Faint intermittent flow in the left vertebral artery. Skeleton: Ordinary cervical spine degeneration Other neck: No acute finding Upper chest: Paraseptal emphysema. Review of the MIP images confirms the above findings CTA HEAD FINDINGS Anterior circulation: Heavily calcified carotid siphons with at least moderate stenosis on the right. TOn the left there is quantification due to underfilling. Right M1 occlusion with faint downstream reconstitution. Major vessels on the left show better patency. Negative for aneurysm Posterior circulation: Vertebral and basilar arteries are widely patent. Fetal type bilateral PCA flow. No PCA branch occlusion. Venous sinuses: Unremarkable Anatomic variants: As above Review of the MIP images confirms the above findings IMPRESSION: 1. Emergent large vessel occlusion at the right M1 segment. 2. Severe bilateral ICA origin stenosis with underfilling left. 3. Thready intermittent flow in the non dominant left vertebral artery. 4. 60% left subclavian origin stenosis. Electronically Signed   By: Jorje Guild M.D.   On: 11/03/2020 06:41    Pertinent labs & imaging results that were available during my care of the patient were reviewed by me and considered in my medical decision making (see MDM for details).  Medications Ordered in ED Medications  sodium chloride flush (NS) 0.9 % injection 3 mL (has no administration in time range)  clevidipine (CLEVIPREX) 0.5 MG/ML infusion (has no administration in time range)  labetalol (NORMODYNE) 5 MG/ML injection (has no administration in time range)  fentaNYL (SUBLIMAZE) 100 MCG/2ML injection (has no administration in time range)   stroke: mapping our early stages of recovery book (has no administration in time range)  0.9 %  sodium chloride infusion (has no administration in time range)  acetaminophen (TYLENOL) tablet 650 mg (has no administration in time range)    Or  acetaminophen (TYLENOL) 160 MG/5ML  solution 650 mg (has no administration in time range)    Or  acetaminophen (TYLENOL) suppository 650 mg (has no administration in time range)  senna-docusate (Senokot-S) tablet 1 tablet (has no administration in time range)  pantoprazole (PROTONIX) injection 40 mg (has no administration in time range)  labetalol (NORMODYNE) injection 20 mg (has no administration in time range)    And  clevidipine (CLEVIPREX) infusion 0.5 mg/mL (has no administration in time range)  cholecalciferol (VITAMIN D3) tablet 1,000 Units (has no administration in time range)  atorvastatin (LIPITOR) tablet 10 mg (has no administration in time range)  iohexol (OMNIPAQUE) 350 MG/ML injection 100 mL (100 mLs Intravenous Contrast Given 11/03/20 0630)  tenecteplase (TNKASE) injection for Stroke 22 mg (22 mg Intravenous Given 11/03/20 0640)  labetalol (NORMODYNE) injection 20 mg (20 mg Intravenous Given 11/03/20 0636)  Procedures .1-3 Lead EKG Interpretation Performed by: Fatima Blank, MD Authorized by: Fatima Blank, MD     Interpretation: abnormal     ECG rate:  110   ECG rate assessment: tachycardic     Rhythm: sinus tachycardia     Ectopy: none     Conduction: normal   .Critical Care Performed by: Fatima Blank, MD Authorized by: Fatima Blank, MD   Critical care provider statement:    Critical care time (minutes):  30   Critical care time was exclusive of:  Separately billable procedures and treating other patients   Critical care was necessary to treat or prevent imminent or life-threatening deterioration of the following conditions:  Cardiac failure and CNS failure or compromise   Critical care was time spent personally by me on the following activities:  Development of treatment plan with patient or surrogate, discussions with consultants,  evaluation of patient's response to treatment, examination of patient, obtaining history from patient or surrogate, review of old charts, re-evaluation of patient's condition, pulse oximetry, ordering and review of radiographic studies, ordering and review of laboratory studies and ordering and performing treatments and interventions   Care discussed with: admitting provider    (including critical care time)  Medical Decision Making / ED Course I have reviewed the nursing notes for this encounter and the patient's prior records (if available in EHR or on provided paperwork).  [name] was evaluated in Emergency Department on 11/03/2020 for the symptoms described in the history of present illness. He was evaluated in the context of the global COVID-19 pandemic, which necessitated consideration that the patient might be at risk for infection with the SARS-CoV-2 virus that causes COVID-19. Institutional protocols and algorithms that pertain to the evaluation of patients at risk for COVID-19 are in a state of rapid change based on information released by regulatory bodies including the CDC and federal and state organizations. These policies and algorithms were followed during the patient's care in the ED.  Clinical Course as of 11/03/20 8937  Nancy Fetter Nov 03, 2020  0633 Encoded as a code STEMI and possible stroke. EKG transmitted by EMS did show ST segment elevation in the lateral precordial leads. Upon arrival patient noted to have left-sided neglect.  Formal CODE stroke activated upon arrival.  Patient was taken to CT scanner which did not show evidence of ICH.  CTA was obtained to rule out dissection and assess CVA source.  Confirmed right MCA stroke.  No dissection. Cardiology deferred catheterization for now.  Medical management recommended.  Patient is a candidate for TNK, which was given.  He will be taken to Neuro ICU for further management. [PC]    Clinical Course User Index [PC]  Marykate Heuberger, Grayce Sessions, MD     Pertinent labs & imaging results that were available during my care of the patient were reviewed by me and considered in my medical decision making:    Final Clinical Impression(s) / ED Diagnoses Final diagnoses:  Cerebral infarction due to occlusion of right middle cerebral artery [facility])     This chart was dictated using voice recognition software.  Despite best efforts to proofread,  errors can occur which can change the documentation meaning.    Fatima Blank, MD 11/03/20 [phone]

## 2020-11-03 NOTE — Consult Note (Addendum)
Cardiology Consultation:   Patient ID: Christopher Lang MRN: [id number]; DOB: 08-Nov-1955  Admit date: 11/03/2020 Date of Consult: 11/03/2020  PCP:  Dettinger, Fransisca Kaufmann, MD   [facility] Providers Cardiologist:  Rozann Lesches, MD        Patient Profile:   Christopher Lang is a 65 y.o. male with a hx of P.afib, HTN, HLD, COPD, OSA who is being seen 11/03/2020 for the evaluation of chest pain, left sided weakness at the request of ER doc- Dr Leonette Monarch.  History of Present Illness:   Christopher Lang is a 65 y.o. male with a hx of P.afib, HTN, HLD, COPD, OSA who is being seen 11/03/2020 for the evaluation of chest pain, left sided weakness.  History is limited as patient cannot give reliable history due to stroke. Per chart review, history obtained from EMS- patient was c/o chest pain this am and was noted to have left sided weakness, wife reported his arms were swingning and concern for seizures- but EMS did not think so. They gave him aspirin, EKG obtained showed Anterior STEMI- and was brought to the hospital  ER: CODE STROKE activated, left sided palsy, facial droop, neglect and taken emergently to CT scan- please review CT head/neck, chest report. But no dissection, bleeding- so got TPA per neurology.  EKG: Anterior STEMI (V2-V6 STE), STE in I, aVL and reciprocal ST depression in inferior leads  Cardiac work up: Echocardiogram 06/07/2019:  1. Left ventricular ejection fraction, by estimation, is 60 to 65%. The  left ventricle has normal function. The left ventricle has no regional  wall motion abnormalities. Left ventricular diastolic parameters were  normal.   2. Right ventricular systolic function is normal. The right ventricular  size is normal. There is normal pulmonary artery systolic pressure.   3. The mitral valve is degenerative. Trivial mitral valve regurgitation.   4. On visual inspection, there appears to be at least mild calcific  aortic stenosis (albeit  hemodynamics do not support this). The aortic  valve is tricuspid. Aortic valve regurgitation is not visualized. Aortic  valve area, by VTI measures 2.21 cm.  Aortic valve mean gradient measures 6.0 mmHg. Aortic valve Vmax measures  1.68 m/s.   5. The inferior vena cava is normal in size with greater than 50%  respiratory variability, suggesting right atrial pressure of 3 mmHg.   Comparison(s): Echocardiogram done 12/03/15 showed an EF of 65%.   Past Medical History:  Diagnosis Date   Acute CVA (cerebrovascular accident) ([place]) 11/03/2020   Acute left-sided weakness 11/03/2020   Acute ST elevation myocardial infarction (STEMI) ([place]) 11/03/2020   Cataract    Removed Dec 2017/Jan 2018   COPD (chronic obstructive pulmonary disease) ([place])    Essential hypertension    H. pylori infection 11/2016   With documented eradication via biopsy Feb 2019   History of atrial fibrillation    November 2017   Hyperlipidemia    Sleep apnea    Did not tolerate CPAP   Tubulovillous adenoma of rectum 04/2010    Past Surgical History:  Procedure Laterality Date   CATARACT EXTRACTION W/PHACO Right 01/16/2016   Procedure: CATARACT EXTRACTION PHACO AND INTRAOCULAR LENS PLACEMENT RIGHT EYE CDE= 6.97;  Surgeon: Tonny Branch, MD;  Location: [facility];  Service: Ophthalmology;  Laterality: Right;  right   CATARACT EXTRACTION W/PHACO Left 01/30/2016   Procedure: CATARACT EXTRACTION PHACO AND INTRAOCULAR LENS PLACEMENT LEFT EYE CDE 3.13;  Surgeon: Tonny Branch, MD;  Location: [facility];  Service: Ophthalmology;  Laterality: Left;  left   COLONOSCOPY  2012   COLONOSCOPY N/A 12/04/2016   three 4-6 mm tubular adenomas, moderate size external and internal hemorrhoids, redundant left colon, 3 year surveillance   ESOPHAGOGASTRODUODENOSCOPY N/A 12/04/2016   Dr. Oneida Alar: normal esophagus, small hiatal hernia, chronic gastritis with H.pylori, peptic duodenitis, atypical lymphoid proliferation. Surveillance EGD Feb 2019 without  abnormal cells, negative H.pylori   ESOPHAGOGASTRODUODENOSCOPY N/A 03/15/2017   mild gastritis, no atypical cells, negative H.pylori   EXCISION, SKI  08/2016   R THIGH     Home Medications:  Prior to Admission medications   Medication Sig Start Date End Date Taking? Authorizing Provider  aspirin EC 81 MG tablet Take 81 mg by mouth daily.    [provider]  atorvastatin (LIPITOR) 10 MG tablet Take 1 tablet (10 mg total) by mouth daily at 6 PM. 03/09/19   Dettinger, Fransisca Kaufmann, MD  Azilsartan-Chlorthalidone (EDARBYCLOR) 40-25 MG TABS Take 1 tablet by mouth daily. (Needs to be seen before next refill) 03/01/20   Dettinger, Fransisca Kaufmann, MD  azithromycin (ZITHROMAX Z-PAK) 250 MG tablet As directed 04/29/20   Chevis Pretty, FNP  brompheniramine-pseudoephedrine-DM 30-2-10 MG/5ML syrup Take 5 mLs by mouth 4 (four) times daily as needed. 04/29/20   Hassell Done Mary-Margaret, FNP  cholecalciferol (VITAMIN D) 1000 units tablet Take 1,000 Units by mouth daily.    [provider]  cilostazol (PLETAL) 100 MG tablet TAKE 1 TABLET BY MOUTH TWICE A DAY 05/20/20   Verta Ellen., NP  diltiazem (CARDIZEM CD) 240 MG 24 hr capsule TAKE 1 CAPSULE BY MOUTH EVERY DAY 05/20/20   Satira Sark, MD  fish oil-omega-3 fatty acids 1000 MG capsule Take 1,000 mg by mouth daily.      [provider]  pantoprazole (PROTONIX) 40 MG tablet Take 1 tablet (40 mg total) by mouth daily before breakfast. 03/09/19   Dettinger, Fransisca Kaufmann, MD    Inpatient Medications: Scheduled Meds:  labetalol       labetalol  20 mg Intravenous Once   sodium chloride flush  3 mL Intravenous Once   Continuous Infusions:  clevidipine     clevidipine     PRN Meds:   Allergies:   No Known Allergies  Social History:   Social History   Socioeconomic History   Marital status: Married    Spouse name: Consulting civil engineer   Number of children: 2   Years of education: GED   Highest education level: Not on file  Occupational  History   Occupation: disability    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  Tobacco Use   Smoking status: Every Day    Packs/day: 1.00    Years: 45.00    Pack years: 45.00    Types: Cigarettes    Last attempt to quit: 02/28/2017    Years since quitting: 3.6   Smokeless tobacco: Never   Tobacco comments:    started back - pack per day  Vaping Use   Vaping Use: Never used  Substance and Sexual Activity   Alcohol use: Yes    Alcohol/week: 0.0 standard drinks    Comment: occas   Drug use: No   Sexual activity: Yes    Birth control/protection: Post-menopausal  Other Topics Concern   Not on file  Social History Narrative   Lives with wife [name]   Leisure - couch potato   Social Determinants of Health   Financial Resource Strain: Not on file  Food Insecurity: Not on file  Transportation Needs:  Not on file  Physical Activity: Not on file  Stress: Not on file  Social Connections: Not on file  Intimate Partner Violence: Not on file    Family History:    Family History  Problem Relation Age of Onset   CAD Mother    High blood pressure Mother    Heart attack Mother    Leukemia Father    Early death Father    Heart attack Sister    Diabetes Sister    Cancer Brother    Deep vein thrombosis Brother    Hypertension Daughter    Stroke Sister    Stroke Sister    Heart attack Sister    Deep vein thrombosis Sister    Hypertension Sister    Diabetes Sister    Arthritis Sister    Arthritis Sister    Arthritis Sister    Hypertension Sister    Heart attack Sister    Cancer Brother        KIDNEY   Kidney disease Brother    Deep vein thrombosis Brother    Seizures Brother    Heart attack Brother    Colon cancer Neg Hx    Colon polyps Neg Hx      ROS:  Please see the history of present illness.  All other ROS reviewed and negative.     Physical Exam/Data:   Vitals:   11/03/20 0628 11/03/20 0629 11/03/20 0630 11/03/20 0639  BP: (!) 167/107 (!) 181/119  (!) 170/98  Pulse:    (!) 116 (!) 106  Resp:   20 (!) 22  SpO2:   94% 90%  Weight:       No intake or output data in the 24 hours ending 11/03/20 0657 Last 3 Weights 11/03/2020 12/12/2019 08/15/2019  Weight (lbs) 193 lb 2 oz 207 lb 199 lb  Weight (kg) 87.6 kg 93.895 kg 90.266 kg     Body mass index is 26.19 kg/m.  General:  Well nourished, well developed, alert HEENT: normal, left sided palsy, facial droop Neck: no JVD Vascular: No carotid bruits; Distal pulses 2+ bilaterally Cardiac:  normal S1, S2; cannot accurately hear murmurs Lungs:  clear to auscultation bilaterally, no wheezing, rhonchi or rales  Abd: soft, nontender, no hepatomegaly  Ext: no edema Musculoskeletal:  left sided palsy/weakness Skin: warm and dry  Neuro:  left sided palsy/weakness, mumbling speech, facial droop Psych:  flat effect  EKG:  The EKG was personally reviewed and demonstrates:  as noted above- anterior STEMI  Relevant CV Studies: As noted above  Laboratory Data:  High Sensitivity Troponin:  No results for input(s): TROPONINIHS in the last 720 hours.   Chemistry Recent Labs  Lab 11/03/20 0622  NA 136  K 3.8  CL 98  GLUCOSE 201*  BUN 13  CREATININE 0.70    No results for input(s): PROT, ALBUMIN, AST, ALT, ALKPHOS, BILITOT in the last 168 hours. Lipids No results for input(s): CHOL, TRIG, HDL, LABVLDL, LDLCALC, CHOLHDL in the last 168 hours.  Hematology Recent Labs  Lab 11/03/20 0603 11/03/20 0622  WBC 15.1*  --   RBC 5.02  --   HGB 16.8 17.3*  HCT 47.1 51.0  MCV 93.8  --   MCH 33.5  --   MCHC 35.7  --   RDW 12.1  --   PLT 210  --    Thyroid No results for input(s): TSH, FREET4 in the last 168 hours.  BNPNo results for input(s): BNP, PROBNP in the last  168 hours.  DDimer No results for input(s): DDIMER in the last 168 hours.   Radiology/Studies:  CT HEAD CODE STROKE WO CONTRAST  Result Date: 11/03/2020 CLINICAL DATA:  Code stroke. EXAM: CT HEAD WITHOUT CONTRAST TECHNIQUE: Contiguous axial  images were obtained from the base of the skull through the vertex without intravenous contrast. COMPARISON:  11/01/2013 FINDINGS: Brain: On coronal reformats there is equivocal cortical indistinctness of the right frontal operculum. No hemorrhage or hydrocephalus. Negative for mass. Patchy remote cortically based infarcts at the left vertex. Vascular: Hyperdense right M1 segment Skull: Normal. Negative for fracture or focal lesion. Sinuses/Orbits: No acute finding. Other: Page to Dr. Lorrin Goodell at 6:24 am on 11/03/2020 by text page via the [employer] [facility] messaging system. ASPECTS [facility] Stroke Program Early CT Score) - Ganglionic level infarction (caudate, lentiform nuclei, internal capsule, insula, M1-M3 cortex): 6 or 7 - Supraganglionic infarction (M4-M6 cortex): 3 Total score (0-10 with 10 being normal): 9 or 10 IMPRESSION: 1. Dense right M1 segment suspicious for thrombosis. 2. Equivocal for acute infarct at the right insula/operculum. 3. No acute hemorrhage. 4. 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 Electronically Signed   By: Jorje Guild M.D.   On: 11/03/2020 06:27   CT ANGIO HEAD CODE STROKE  Result Date: 11/03/2020 CLINICAL DATA:  Assess intracranial arteries. EXAM: CT ANGIOGRAPHY HEAD AND NECK TECHNIQUE: Multidetector CT imaging of the head and neck was performed using the standard protocol during bolus administration of intravenous contrast. Multiplanar CT image reconstructions and MIPs were obtained to evaluate the vascular anatomy. Carotid stenosis measurements (when applicable) are obtained utilizing NASCET criteria, using the distal internal carotid diameter as the denominator. CONTRAST:  16mL OMNIPAQUE IOHEXOL 350 MG/ML SOLN COMPARISON:  None similar FINDINGS: CTA NECK FINDINGS Aortic arch: Atheromatous plaque.  Three vessel branching. Right carotid system: Primarily calcific atherosclerosis with severe ICA origin stenosis to the degree that the lumen is not accurately measurable. No  dissection. Left carotid system: Atherosclerosis with bulky mixed density plaque at the left ICA bulb with severe stenosis causing downstream faint underfilling Vertebral arteries: Proximal subclavian atherosclerosis with up to 60% left origin stenosis. Dominant right vertebral artery with atherosclerotic calcification but wide patency to the dura. Faint intermittent flow in the left vertebral artery. Skeleton: Ordinary cervical spine degeneration Other neck: No acute finding Upper chest: Paraseptal emphysema. Review of the MIP images confirms the above findings CTA HEAD FINDINGS Anterior circulation: Heavily calcified carotid siphons with at least moderate stenosis on the right. TOn the left there is quantification due to underfilling. Right M1 occlusion with faint downstream reconstitution. Major vessels on the left show better patency. Negative for aneurysm Posterior circulation: Vertebral and basilar arteries are widely patent. Fetal type bilateral PCA flow. No PCA branch occlusion. Venous sinuses: Unremarkable Anatomic variants: As above Review of the MIP images confirms the above findings IMPRESSION: 1. Emergent large vessel occlusion at the right M1 segment. 2. Severe bilateral ICA origin stenosis with underfilling left. 3. Thready intermittent flow in the non dominant left vertebral artery. 4. 60% left subclavian origin stenosis. Electronically Signed   By: Jorje Guild M.D.   On: 11/03/2020 06:41   CT ANGIO NECK CODE STROKE  Result Date: 11/03/2020 CLINICAL DATA:  Assess intracranial arteries. EXAM: CT ANGIOGRAPHY HEAD AND NECK TECHNIQUE: Multidetector CT imaging of the head and neck was performed using the standard protocol during bolus administration of intravenous contrast. Multiplanar CT image reconstructions and MIPs were obtained to evaluate the vascular anatomy. Carotid stenosis measurements (when  applicable) are obtained utilizing NASCET criteria, using the distal internal carotid diameter  as the denominator. CONTRAST:  180mL OMNIPAQUE IOHEXOL 350 MG/ML SOLN COMPARISON:  None similar FINDINGS: CTA NECK FINDINGS Aortic arch: Atheromatous plaque.  Three vessel branching. Right carotid system: Primarily calcific atherosclerosis with severe ICA origin stenosis to the degree that the lumen is not accurately measurable. No dissection. Left carotid system: Atherosclerosis with bulky mixed density plaque at the left ICA bulb with severe stenosis causing downstream faint underfilling Vertebral arteries: Proximal subclavian atherosclerosis with up to 60% left origin stenosis. Dominant right vertebral artery with atherosclerotic calcification but wide patency to the dura. Faint intermittent flow in the left vertebral artery. Skeleton: Ordinary cervical spine degeneration Other neck: No acute finding Upper chest: Paraseptal emphysema. Review of the MIP images confirms the above findings CTA HEAD FINDINGS Anterior circulation: Heavily calcified carotid siphons with at least moderate stenosis on the right. TOn the left there is quantification due to underfilling. Right M1 occlusion with faint downstream reconstitution. Major vessels on the left show better patency. Negative for aneurysm Posterior circulation: Vertebral and basilar arteries are widely patent. Fetal type bilateral PCA flow. No PCA branch occlusion. Venous sinuses: Unremarkable Anatomic variants: As above Review of the MIP images confirms the above findings IMPRESSION: 1. Emergent large vessel occlusion at the right M1 segment. 2. Severe bilateral ICA origin stenosis with underfilling left. 3. Thready intermittent flow in the non dominant left vertebral artery. 4. 60% left subclavian origin stenosis. Electronically Signed   By: Jorje Guild M.D.   On: 11/03/2020 06:41     Assessment and Plan:   Acute CVA (right MCA infarct) with left sided weakness Acute Anterior STEMI H/o paroxysmal Afib Comobidities: COPD, HTN, HLD, OSA H/o mild aortic  stenosis (05/2019)   Plan: - patient had concomitant presentation of acute chest pain (Anterior STEMI) and noted to have acute left sided weakness (STROKE)- right MCA stroke. Given his presentation of stroke, he is at high risk for bleeding (hemorrhagic conversion) if we take him to the cath lab for LHC/PCI as we need boluses of heparin gtt. He had differential BP in the arm- concern for dissection but CT chest (atleast- no type A dissection) that could be visualized.  - We would recommend medical management for his Anterior STEMI.  - continue aspirin 81mg , begin plavix 75mg  daily, metoprolol tart 25mg  BID, atorvastatin 80mg  daily.  - obtain ECHO in am - he got TPA: for stroke, follow neuro recs, neuro ICU protocol for stroke management   - he has life threatening condition and risk stratification cannot done in this situation (emergent/urgent procedures), risk/ benefit of procedure should be discussed with patient & family,  if her were to be taken for thrombectomy procedure.   Will follow along   Risk Assessment/Risk Scores:     TIMI Risk Score for ST  Elevation MI:   The patient's TIMI risk score is  , which indicates a  % risk of all cause mortality at 30 days.     CHA2DS2-VASc Score = 5   This indicates a 7.2% annual risk of stroke. The patient's score is based upon: CHF History: 0 HTN History: 1 Diabetes History: 0 Stroke History: 2 Vascular Disease History: 1 Age Score: 1 Gender Score: 0        For questions or updates, please contact [facility] Please consult [email or web address] for contact info under    Signed, Renae Fickle, MD  11/03/2020 6:57 AM

## 2020-11-03 NOTE — Consult Note (Signed)
NAME:  Christopher Lang, MRN:  [id number], DOB:  1955/05/03, LOS: 0 ADMISSION DATE:  11/03/2020, CONSULTATION DATE:  11/03/2020  REFERRING MD:  Erlinda Hong, stroke , CHIEF COMPLAINT: Acute left-sided weakness and chest pain  History of Present Illness:  65 year old man brought in by EMS complaining of chest pain and noted to have left-sided weakness.  EMS gave him aspirin, arrived as a code stroke EKG on arrival to ED showed anterior STEMI V2 to V6 ST elevation.  He was noted to have left facial droop and neglect Head CT showed dense right M1 segment, remote cortical infarcts on left .  CT angiogram confirmed large vessel occlusion of right M1 segment with severe bilateral ICA origin stenosis and 60% left subclavian stenosis. Seen by cardiology, recommended medical management of anterior STEMI in view of acute stroke.  Given TN K , taken to neuro IR. Angiogram showed high-grade stenosis of right ICA bifurcation, treated by PTA, mechanical thrombectomy of right M1 performed Neo-Synephrine needed during procedure, transferred to ICU intubated with sheath in place and sedated on propofol and on Cleviprex for hypertension  Pertinent  Medical History  Mild aortic stenosis Paroxysmal atrial fibrillation Hypertension OSA COPD  Significant Hospital Events: Including procedures, antibiotic start and stop dates in addition to other pertinent events     Interim History / Subjective:    Objective   Blood pressure (!) 169/96, pulse 87, temperature 97.9 F (36.6 C), temperature source Temporal, resp. rate 20, height 6' (1.829 m), weight 87.6 kg, SpO2 95 %.    Vent Mode: PRVC FiO2 (%):  [60 %] 60 % Set Rate:  [12 bmp] 12 bmp Vt Set:  [620 mL] 620 mL PEEP:  [5 cmH20] 5 cmH20  No intake or output data in the 24 hours ending 11/03/20 0919 Filed Weights   11/03/20 0600  Weight: 87.6 kg    Examination: General: Elderly man, intubated, sedated, on propofol and Cleviprex HENT: Pupils 3 mm bilaterally  equally reactive to light, no JVD, no pallor or icterus Lungs: Bilateral breath sounds clear, no accessory muscle use Cardiovascular: S1-S2 regular, no murmur Abdomen: Soft, nontender Extremities: No deformity, left femoral arterial line, distal pulses weak but able to Doppler Neuro: Sedated, GCS 3   EKG reviewed -ST elevation anterolateral leads V2 to V6, I and aVL with T wave inversions  Resolved Hospital Problem list     Assessment & Plan:  Concomitant acute coronary syndrome/STEMI with acute right MCA CVA, left intubated postprocedure  Postop respiratory failure -vent settings reviewed and adjusted. Will obtain ABG and chest x-ray Okay to use propofol and intermittent fentanyl for sedation with goal RASS 0 to -1 Hopeful to extubate within 6 hours once he does not have to lie flat anymore -Carries diagnosis of COPD, no PFTs available, will start duo nebs OSA -nocturnal CPAP postextubation  Hypertensive urgency -using Cleviprex for goal systolic blood pressure 1 20-1 40  Right MCA CVA -frequent neurochecks Imaging per neurology  Acute STEMI -evolving anterolateral wall MI. Obtain echo. Aspirin, Plavix, metoprolol and Lipitor per cardiology Hold home Cardizem   Best Practice (right click and "Reselect all SmartList Selections" daily)   Diet/type: NPO w/ meds via tube DVT prophylaxis: LMWH GI prophylaxis: PPI Lines: Arterial Line Foley:  Yes, and it is still needed Code Status:  full code Last date of multidisciplinary goals of care discussion [NA]  Labs   CBC: Recent Labs  Lab 11/03/20 0603 11/03/20 0622  WBC 15.1*  --   NEUTROABS 13.8*  --  HGB 16.8 17.3*  HCT 47.1 51.0  MCV 93.8  --   PLT 210  --     Basic Metabolic Panel: Recent Labs  Lab 11/03/20 0603 11/03/20 0622  NA 133* 136  K 3.8 3.8  CL 98 98  CO2 25  --   GLUCOSE 194* 201*  BUN 11 13  CREATININE 0.93 0.70  CALCIUM 10.2  --    GFR: Estimated Creatinine Clearance: 98.3 mL/min (by C-G  formula based on SCr of 0.7 mg/dL). Recent Labs  Lab 11/03/20 0603  WBC 15.1*    Liver Function Tests: Recent Labs  Lab 11/03/20 0603  AST 83*  ALT 30  ALKPHOS 131*  BILITOT 1.8*  PROT 7.5  ALBUMIN 4.0   No results for input(s): LIPASE, AMYLASE in the last 168 hours. No results for input(s): AMMONIA in the last 168 hours.  ABG    Component Value Date/Time   TCO2 25 11/03/2020 0622     Coagulation Profile: Recent Labs  Lab 11/03/20 0603  INR 1.0    Cardiac Enzymes: No results for input(s): CKTOTAL, CKMB, CKMBINDEX, TROPONINI in the last 168 hours.  HbA1C: Hgb A1c MFr Bld  Date/Time Value Ref Range Status  08/15/2019 10:02 AM 5.0 <5.7 % of total Hgb Final    Comment:    For the purpose of screening for the presence of diabetes: . <5.7%       Consistent with the absence of diabetes 5.7-6.4%    Consistent with increased risk for diabetes             (prediabetes) > or =6.5%  Consistent with diabetes . This assay result is consistent with a decreased risk of diabetes. . Currently, no consensus exists regarding use of hemoglobin A1c for diagnosis of diabetes in children. . According to American Diabetes Association (ADA) guidelines, hemoglobin A1c <7.0% represents optimal control in non-pregnant diabetic patients. Different metrics may apply to specific patient populations.  Standards of Medical Care in Diabetes(ADA). Marland Kitchen   06/23/2018 02:45 PM 5.5 4.8 - 5.6 % Final    Comment:             Prediabetes: 5.7 - 6.4          Diabetes: >6.4          Glycemic control for adults with diabetes: <7.0     CBG: Recent Labs  Lab 11/03/20 0617  GLUCAP 208*    Review of Systems:   Unable to obtain since brought in from IR intubated and sedated  Past Medical History:  He,  has a past medical history of Acute CVA (cerebrovascular accident) ([place]) (11/03/2020), Acute left-sided weakness (11/03/2020), Acute ST elevation myocardial infarction (STEMI) ([place])  (11/03/2020), Cataract, COPD (chronic obstructive pulmonary disease) ([place]), Essential hypertension, H. pylori infection (11/2016), History of atrial fibrillation, Hyperlipidemia, Sleep apnea, and Tubulovillous adenoma of rectum (04/2010).   Surgical History:   Past Surgical History:  Procedure Laterality Date   CATARACT EXTRACTION W/PHACO Right 01/16/2016   Procedure: CATARACT EXTRACTION PHACO AND INTRAOCULAR LENS PLACEMENT RIGHT EYE CDE= 6.97;  Surgeon: Tonny Branch, MD;  Location: [facility];  Service: Ophthalmology;  Laterality: Right;  right   CATARACT EXTRACTION W/PHACO Left 01/30/2016   Procedure: CATARACT EXTRACTION PHACO AND INTRAOCULAR LENS PLACEMENT LEFT EYE CDE 3.13;  Surgeon: Tonny Branch, MD;  Location: [facility];  Service: Ophthalmology;  Laterality: Left;  left   COLONOSCOPY  2012   COLONOSCOPY N/A 12/04/2016   three 4-6 mm tubular adenomas, moderate size external  and internal hemorrhoids, redundant left colon, 3 year surveillance   ESOPHAGOGASTRODUODENOSCOPY N/A 12/04/2016   Dr. Oneida Alar: normal esophagus, small hiatal hernia, chronic gastritis with H.pylori, peptic duodenitis, atypical lymphoid proliferation. Surveillance EGD Feb 2019 without abnormal cells, negative H.pylori   ESOPHAGOGASTRODUODENOSCOPY N/A 03/15/2017   mild gastritis, no atypical cells, negative H.pylori   EXCISION, SKI  08/2016   R THIGH     Social History:   reports that he has been smoking cigarettes. He has a 45.00 pack-year smoking history. He has never used smokeless tobacco. He reports current alcohol use. He reports that he does not use drugs.   Family History:  His family history includes Arthritis in his sister, sister, and sister; CAD in his mother; Cancer in his brother and brother; Deep vein thrombosis in his brother, brother, and sister; Diabetes in his sister and sister; Early death in his father; Heart attack in his brother, mother, sister, sister, and sister; High blood pressure in his mother;  Hypertension in his daughter, sister, and sister; Kidney disease in his brother; Leukemia in his father; Seizures in his brother; Stroke in his sister and sister. There is no history of Colon cancer or Colon polyps.   Allergies No Known Allergies   Home Medications  Prior to Admission medications   Medication Sig Start Date End Date Taking? Authorizing Provider  aspirin EC 81 MG tablet Take 81 mg by mouth daily.   Yes [provider]  atorvastatin (LIPITOR) 10 MG tablet Take 1 tablet (10 mg total) by mouth daily at 6 PM. 03/09/19  Yes Dettinger, Fransisca Kaufmann, MD  cholecalciferol (VITAMIN D) 1000 units tablet Take 1,000 Units by mouth daily.   Yes [provider]  cilostazol (PLETAL) 100 MG tablet TAKE 1 TABLET BY MOUTH TWICE A DAY Patient taking differently: Take 100 mg by mouth 2 (two) times daily. 05/20/20  Yes Verta Ellen., NP  diltiazem (CARDIZEM CD) 240 MG 24 hr capsule TAKE 1 CAPSULE BY MOUTH EVERY DAY Patient taking differently: Take 240 mg by mouth daily. 05/20/20  Yes Satira Sark, MD  fish oil-omega-3 fatty acids 1000 MG capsule Take 1,000 mg by mouth daily.     Yes [provider]  pantoprazole (PROTONIX) 40 MG tablet Take 1 tablet (40 mg total) by mouth daily before breakfast. Patient not taking: No sig reported 03/09/19   Dettinger, Fransisca Kaufmann, MD     Critical care time: 11m    Kara Mead MD. [facility]. [place] [facility] Pager : [phone]  If no response to pager , please call [phone] until 7 pm After 7:00 pm call Elink  [phone]   11/03/2020

## 2020-11-03 NOTE — Procedures (Addendum)
Neuro-Interventional Radiology  Post Cerebral Angiogram Procedure Note  Operator:    Dr. Earleen Newport Assistant:   None  History:   65 yo male presenting to ED with chest pain, nausea, arriving with new onset left sided weakness.  Diagnosis of acute coronary syndrome/STEMI and acute stroke/ELVO of right M1.   Presents for thrombectomy.   Baseline mRS:  0 NIHSS:   11 Site of occlusion:  Rigth M1   Procedure: US guided right CFA access US guided left CFA access/arterial monitoring Cervical & Cerebral Angiogram PTA high grade symptomatic stenosis of the right ICA bifurcation Mechanical Thrombectomy right M1 Deployment of Angioseal Flat Panel CT in NIR  First Pass Device:  Combo CAT5 aspiration & Solitaire 4x40  Result   TICI 3  Findings:   High grade stenosis of right ICA bifurcation. Treated to <50% stenosis by PTA. Stent not deployed as DAPT would be required immediately, in this TNK patient, with potential for hemorrhagic conversion of infarct.    Right distal M1 occlusion, treated to TICI 3  Flat panel CT: Neg for hemorrhage.   Anesthesia:   GETA  EBL:    ~704UG     Complication:  None   Medication: IV tPA administered?: Yes received IV TNK IA Medication:  no  Recommendations: - right hip straight 5 hrs - left hip straight with 60F arterial monitoring until DC.  Standard sheath orders - Goal SBP 120-140.   - Frequent NV checks - Repeat CT or MRI imaging recommended within 36 hours, discretion of Neurology - patient will remain intubated given critical illness - Consider anti-platelet therapy ASAP, given diagnosis of symptomatic high grade right carotid stenosis - NIR to follow.  Will need follow up for both the right ICA stenosis and the left carotid stenosis.  - CTA shows ~60% left subclavian artery stenosis, which accounts for the differential SBP measured in upper extremity. CTA neg for dissection - 4N 16  Signed,  Dulcy Fanny. Earleen Newport, DO

## 2020-11-03 NOTE — Progress Notes (Signed)
PHARMACIST CODE STROKE RESPONSE  Notified to mix TNK at 0630 by Dr. Lorrin Goodell Delivered TNK to RN at [phone]  TNK dose = 22 mg IV over 5 seconds  Issues/delays encountered: Treated BP w/ labetalol  Wynona Neat, PharmD, BCPS  11/03/20 6:43 AM

## 2020-11-03 NOTE — Progress Notes (Signed)
STROKE TEAM PROGRESS NOTE   INTERVAL HISTORY His daughter and wife are at the bedside.  Pt was intubated post procedure. Currently on sedation due to severe cough gag with vent. Per RN, pt was able to follow commands on the right. Currently BP stable. Plan to extubated this pm.   Vitals:   11/03/20 1130 11/03/20 1145 11/03/20 1200 11/03/20 1215  BP: 138/79 134/77 129/77 126/81  Pulse: 75 73 72 72  Resp: 18 18 18 18   Temp:   (!) 97.2 F (36.2 C)   TempSrc:   Axillary   SpO2: 97% 99% 99% 99%  Weight:      Height:       CBC:  Recent Labs  Lab 11/03/20 0603 11/03/20 0622 11/03/20 1016  WBC 15.1*  --   --   NEUTROABS 13.8*  --   --   HGB 16.8 17.3* 16.3  HCT 47.1 51.0 48.0  MCV 93.8  --   --   PLT 210  --   --    Basic Metabolic Panel:  Recent Labs  Lab 11/03/20 0603 11/03/20 0622 11/03/20 1016  NA 133* 136 136  K 3.8 3.8 4.3  CL 98 98  --   CO2 25  --   --   GLUCOSE 194* 201*  --   BUN 11 13  --   CREATININE 0.93 0.70  --   CALCIUM 10.2  --   --    Lipid Panel: No results for input(s): CHOL, TRIG, HDL, CHOLHDL, VLDL, LDLCALC in the last 168 hours.  HgbA1c:  Recent Labs  Lab 11/03/20 1137  HGBA1C 4.9   Urine Drug Screen: No results for input(s): LABOPIA, COCAINSCRNUR, LABBENZ, AMPHETMU, THCU, LABBARB in the last 168 hours.  Alcohol Level No results for input(s): ETH in the last 168 hours.  IMAGING past 24 hours Portable Chest x-ray  Result Date: 11/03/2020 CLINICAL DATA:  Status post endotracheal tube and OG tube placement. EXAM: PORTABLE CHEST 1 VIEW COMPARISON:  Chest x-ray dated 12/02/2015. FINDINGS: Endotracheal tube appears well positioned with tip above the level of the carina. Enteric tube passes below the diaphragm. Mild central pulmonary vascular congestion. Probable mild atelectasis at the medial aspect of the RIGHT lung base. Coarse interstitial markings throughout the remainder of both lungs. No pleural effusion or pneumothorax is seen. IMPRESSION:  1. Endotracheal and enteric tubes appear well positioned. 2. Central pulmonary vascular congestion and coarse interstitial markings throughout both lungs, most likely interstitial edema superimposed on chronic interstitial lung disease. 3. Probable atelectasis at the RIGHT lung base. Electronically Signed   By: Franki Cabot M.D.   On: 11/03/2020 09:53   CT HEAD CODE STROKE WO CONTRAST  Result Date: 11/03/2020 CLINICAL DATA:  Code stroke. EXAM: CT HEAD WITHOUT CONTRAST TECHNIQUE: Contiguous axial images were obtained from the base of the skull through the vertex without intravenous contrast. COMPARISON:  11/01/2013 FINDINGS: Brain: On coronal reformats there is equivocal cortical indistinctness of the right frontal operculum. No hemorrhage or hydrocephalus. Negative for mass. Patchy remote cortically based infarcts at the left vertex. Vascular: Hyperdense right M1 segment Skull: Normal. Negative for fracture or focal lesion. Sinuses/Orbits: No acute finding. Other: Page to Dr. Lorrin Goodell at 6:24 am on 11/03/2020 by text page via the [employer] messaging system. ASPECTS [facility] Stroke Program Early CT Score) - Ganglionic level infarction (caudate, lentiform nuclei, internal capsule, insula, M1-M3 cortex): 6 or 7 - Supraganglionic infarction (M4-M6 cortex): 3 Total score (0-10 with 10 being normal): 9 or 10  IMPRESSION: 1. Dense right M1 segment suspicious for thrombosis. 2. Equivocal for acute infarct at the right insula/operculum. 3. No acute hemorrhage. 4. 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 Electronically Signed   By: Jorje Guild M.D.   On: 11/03/2020 06:27   CT ANGIO HEAD CODE STROKE  Result Date: 11/03/2020 CLINICAL DATA:  Assess intracranial arteries. EXAM: CT ANGIOGRAPHY HEAD AND NECK TECHNIQUE: Multidetector CT imaging of the head and neck was performed using the standard protocol during bolus administration of intravenous contrast. Multiplanar CT image reconstructions and MIPs were  obtained to evaluate the vascular anatomy. Carotid stenosis measurements (when applicable) are obtained utilizing NASCET criteria, using the distal internal carotid diameter as the denominator. CONTRAST:  117mL OMNIPAQUE IOHEXOL 350 MG/ML SOLN COMPARISON:  None similar FINDINGS: CTA NECK FINDINGS Aortic arch: Atheromatous plaque.  Three vessel branching. Right carotid system: Primarily calcific atherosclerosis with severe ICA origin stenosis to the degree that the lumen is not accurately measurable. No dissection. Left carotid system: Atherosclerosis with bulky mixed density plaque at the left ICA bulb with severe stenosis causing downstream faint underfilling Vertebral arteries: Proximal subclavian atherosclerosis with up to 60% left origin stenosis. Dominant right vertebral artery with atherosclerotic calcification but wide patency to the dura. Faint intermittent flow in the left vertebral artery. Skeleton: Ordinary cervical spine degeneration Other neck: No acute finding Upper chest: Paraseptal emphysema. Review of the MIP images confirms the above findings CTA HEAD FINDINGS Anterior circulation: Heavily calcified carotid siphons with at least moderate stenosis on the right. TOn the left there is quantification due to underfilling. Right M1 occlusion with faint downstream reconstitution. Major vessels on the left show better patency. Negative for aneurysm Posterior circulation: Vertebral and basilar arteries are widely patent. Fetal type bilateral PCA flow. No PCA branch occlusion. Venous sinuses: Unremarkable Anatomic variants: As above Review of the MIP images confirms the above findings IMPRESSION: 1. Emergent large vessel occlusion at the right M1 segment. 2. Severe bilateral ICA origin stenosis with underfilling left. 3. Thready intermittent flow in the non dominant left vertebral artery. 4. 60% left subclavian origin stenosis. Electronically Signed   By: Jorje Guild M.D.   On: 11/03/2020 06:41   CT  ANGIO NECK CODE STROKE  Result Date: 11/03/2020 CLINICAL DATA:  Assess intracranial arteries. EXAM: CT ANGIOGRAPHY HEAD AND NECK TECHNIQUE: Multidetector CT imaging of the head and neck was performed using the standard protocol during bolus administration of intravenous contrast. Multiplanar CT image reconstructions and MIPs were obtained to evaluate the vascular anatomy. Carotid stenosis measurements (when applicable) are obtained utilizing NASCET criteria, using the distal internal carotid diameter as the denominator. CONTRAST:  167mL OMNIPAQUE IOHEXOL 350 MG/ML SOLN COMPARISON:  None similar FINDINGS: CTA NECK FINDINGS Aortic arch: Atheromatous plaque.  Three vessel branching. Right carotid system: Primarily calcific atherosclerosis with severe ICA origin stenosis to the degree that the lumen is not accurately measurable. No dissection. Left carotid system: Atherosclerosis with bulky mixed density plaque at the left ICA bulb with severe stenosis causing downstream faint underfilling Vertebral arteries: Proximal subclavian atherosclerosis with up to 60% left origin stenosis. Dominant right vertebral artery with atherosclerotic calcification but wide patency to the dura. Faint intermittent flow in the left vertebral artery. Skeleton: Ordinary cervical spine degeneration Other neck: No acute finding Upper chest: Paraseptal emphysema. Review of the MIP images confirms the above findings CTA HEAD FINDINGS Anterior circulation: Heavily calcified carotid siphons with at least moderate stenosis on the right. TOn the left there is quantification due  to underfilling. Right M1 occlusion with faint downstream reconstitution. Major vessels on the left show better patency. Negative for aneurysm Posterior circulation: Vertebral and basilar arteries are widely patent. Fetal type bilateral PCA flow. No PCA branch occlusion. Venous sinuses: Unremarkable Anatomic variants: As above Review of the MIP images confirms the above  findings IMPRESSION: 1. Emergent large vessel occlusion at the right M1 segment. 2. Severe bilateral ICA origin stenosis with underfilling left. 3. Thready intermittent flow in the non dominant left vertebral artery. 4. 60% left subclavian origin stenosis. Electronically Signed   By: Jorje Guild M.D.   On: 11/03/2020 06:41    PHYSICAL EXAM  Temp:  [96.6 F (35.9 C)-97.9 F (36.6 C)] 97.2 F (36.2 C) (10/16 1200) Pulse Rate:  [72-116] 72 (10/16 1215) Resp:  [12-24] 18 (10/16 1215) BP: (121-188)/(73-119) 126/81 (10/16 1215) SpO2:  [90 %-99 %] 99 % (10/16 1215) Arterial Line BP: (116-149)/(60-87) 130/87 (10/16 1200) FiO2 (%):  [60 %] 60 % (10/16 1103) Weight:  [87.6 kg] 87.6 kg (10/16 0600)  General - Well nourished, well developed, intubated on sedation.  Ophthalmologic - fundi not visualized due to noncooperation.  Cardiovascular - Regular rate and rhythm.  Neuro - intubated on sedation, eyes closed, not following commands. With forced eye opening, eyes in mid position, not blinking to visual threat, doll's eyes sluggish, not tracking, PERRL. Corneal reflex absent on the left, weak on the right, gag and cough present. Breathing over the vent.  Facial symmetry not able to test due to ET tube.  Tongue protrusion not cooperative. On pain stimulation, RUE and RLE purposeful movement and withdraw. RUE on restrain, able to against gravity. LUE flaccid, LLE slight withdraw to pain. DTR diminished and no babinski. Sensation, coordination and gait not tested.    ASSESSMENT/PLAN ABDULHADI STOPA is a 65 y.o. male with PMH significant for glaucoma, COPD, smoker, HLD, OSA, who presents with CP, nausea, dry heaving, and sudden onset left sided weakness. Found to have anterior STEMI and right MCA occlusion.   Stroke:  right MCA infarct due to right M1 occlusion s/p TNK and IR with TICI3 revascularization, embolic secondary to acute STEMI CT head right M1 hyperdense sign  CTA head & neck right M1  occlusion, severe b/l ICA origin stenosis, 60% left subclavian origin stenosis.  Cerebral angio s/p TICI3 of right M1  MRI  pending MRA  pending CUS pending 2D Echo EF 20-25%. The mid to distal septum/anterior segments are akinetic. The entire apex is akinetic. The myocardium appears thin. Suspect these findings represent a large wrap around LAD infarction (troponin >24,000). No LV thrombus on contrast imaging. LDL 77 HgbA1c 5.0 VTE prophylaxis - SCDs aspirin 81 mg daily and pletal 100 bid prior to admission, now on No antithrombotic within 24h of TNK Therapy recommendations:  pending Disposition:  pending  STEMI Anteriorlateral location Trop > 24,000 2D Echo EF 20-25%. The mid to distal septum/anterior segments are akinetic. The entire apex is akinetic. The myocardium appears thin. Suspect these findings represent a large wrap around LAD infarction (troponin >24,000). No LV thrombus on contrast imaging. Cardiology on board On medical treatment  Hypertension Hypotension  Home meds:  cardizem Initial hypotension -> hypertension  Off Neo or cleviprex BP goal 120-140 within 24h of IR intervention Long-term BP goal normotensive  Hyperlipidemia Home meds:  lipitor 10 LDL 77, goal < 70 Increase lipitor to 20  No need of high intensity statin, given LDL near goal Continue statin at discharge  Tobacco abuse  Current smoker Smoking cessation counseling will be provided  Other Stroke Risk Factors Advanced Age >/= 28  Obstructive sleep apnea, on CPAP at home Congestive heart failure  Other Active Problems Leukocytosis WBC 17.3 COPD  Hospital day # 0  This patient is critically ill due to STEMI, stroke with LVO, s/p TNK and IR and at significant risk of neurological worsening, death form recurrent stroke, hemorrhagic conversion, heart failure. This patient's care requires constant monitoring of vital signs, hemodynamics, respiratory and cardiac monitoring, review of multiple  databases, neurological assessment, discussion with family, other specialists and medical decision making of high complexity. I spent 30 minutes of neurocritical care time in the care of this patient.  Rosalin Hawking, MD PhD Stroke Neurology 11/03/2020 11:27 PM   To contact Stroke Continuity provider, please refer to [email or web address] After hours, contact General Neurology

## 2020-11-03 NOTE — Procedures (Signed)
Extubation Procedure Note  Patient Details:   Name: DMARIUS REEDER DOB: [birth year] MRN: [id number]   Airway Documentation:    Vent end date: 11/03/20 Vent end time: 1514   Evaluation  O2 sats: stable throughout Complications: No apparent complications Patient did tolerate procedure well. Bilateral Breath Sounds: Diminished, Clear   Yes  Patient extubated per MD order. Positive cuff leak. No stridor noted. Patient tolerating well on 3L [place]. RN at bedside.  Herbie Saxon Kamani Lewter 11/03/2020, 3:14 PM

## 2020-11-03 NOTE — H&P (Signed)
NEUROLOGY CONSULTATION NOTE   Date of service: November 03, 2020 Patient Name: Christopher Lang MRN:  [id number] DOB:  November 30, 1955  _ _ _   _ __   _ __ _ _  __ __   _ __   __ _  History of Present Illness  Christopher Lang is a 65 y.o. male with PMH significant for glaucoma, COPD smoker, HLD, OSA, who presents with sudden onset left sided weakness.  Wife reports having nausea and dry heaving all of yesterday. Was not feeling well so went to bed at 2300. Got up at 0230 and was reporting some chest pain and feeling flushed. She called EMS. He was still moving all the arms and legs. By the time EMS arrived, he had developed left sided weakness. EKG per EMS with anterior STEMI. He was initially brought in as a code STEMI and in the ED, code stroke was activated too.  mRS: 0 tNKase: offered. Discussed risks and benefits with patient's wife over the phone including 3% risk of ICH and 30% chance of improvement in symptoms. No hx of ICH, no prior GI bleed, no recent trauma, no recent major surgery, no CNS or spinal surgery, not on anticoagulation, no head trauma, no infective endocarditis hx, no aortic dissection on CTA, no intracranial tumor, no hx of thrombocytopenia, no hx of seizure, no known ventricular thrombus. Thrombectomy: Dr. Earleen Newport discussed risks and benefits of cerebral angiogram and need for potential thrombectomy or angioplasty with wife and brother over the phone and they consented for the intervention. I witnessed the entirety of the conversation. NIHSS components Score: Comment  1a Level of Conscious 0[x]  1[]  2[]  3[]      1b LOC Questions 0[x]  1[]  2[]       1c LOC Commands 0[x]  1[]  2[]       2 Best Gaze 0[]  1[x]  2[]       3 Visual 0[]  1[]  2[x]  3[]      4 Facial Palsy 0[]  1[x]  2[]  3[]      5a Motor Arm - left 0[]  1[]  2[]  3[]  4[x]  UN[]    5b Motor Arm - Right 0[x]  1[]  2[]  3[]  4[]  UN[]    6a Motor Leg - Left 0[]  1[x]  2[]  3[]  4[]  UN[]    6b Motor Leg - Right 0[x]  1[]  2[]  3[]  4[]  UN[]    7 Limb  Ataxia 0[]  1[x]  2[]  3[]  UN[]     8 Sensory 0[]  1[]  2[x]  UN[]      9 Best Language 0[x]  1[]  2[]  3[]      10 Dysarthria 0[]  1[x]  2[]  UN[]      11 Extinct. and Inattention 0[]  1[]  2[x]       TOTAL: 15     ROS   Unable to obtain detailed ROS given large stroke and the acuity of the situation. Past History   Past Medical History:  Diagnosis Date  . Acute CVA (cerebrovascular accident) ([place]) 11/03/2020  . Acute left-sided weakness 11/03/2020  . Acute ST elevation myocardial infarction (STEMI) ([place]) 11/03/2020  . Cataract    Removed Dec 2017/Jan 2018  . COPD (chronic obstructive pulmonary disease) ([place])   . Essential hypertension   . H. pylori infection 11/2016   With documented eradication via biopsy Feb 2019  . History of atrial fibrillation    November 2017  . Hyperlipidemia   . Sleep apnea    Did not tolerate CPAP  . Tubulovillous adenoma of rectum 04/2010   Past Surgical History:  Procedure Laterality Date  . CATARACT EXTRACTION W/PHACO Right 01/16/2016   Procedure: CATARACT  EXTRACTION PHACO AND INTRAOCULAR LENS PLACEMENT RIGHT EYE CDE= 6.97;  Surgeon: Tonny Branch, MD;  Location: [facility];  Service: Ophthalmology;  Laterality: Right;  right  . CATARACT EXTRACTION W/PHACO Left 01/30/2016   Procedure: CATARACT EXTRACTION PHACO AND INTRAOCULAR LENS PLACEMENT LEFT EYE CDE 3.13;  Surgeon: Tonny Branch, MD;  Location: [facility];  Service: Ophthalmology;  Laterality: Left;  left  . COLONOSCOPY  2012  . COLONOSCOPY N/A 12/04/2016   three 4-6 mm tubular adenomas, moderate size external and internal hemorrhoids, redundant left colon, 3 year surveillance  . ESOPHAGOGASTRODUODENOSCOPY N/A 12/04/2016   Dr. Oneida Alar: normal esophagus, small hiatal hernia, chronic gastritis with H.pylori, peptic duodenitis, atypical lymphoid proliferation. Surveillance EGD Feb 2019 without abnormal cells, negative H.pylori  . ESOPHAGOGASTRODUODENOSCOPY N/A 03/15/2017   mild gastritis, no atypical cells, negative H.pylori  .  EXCISION, SKI  08/2016   R THIGH   Family History  Problem Relation Age of Onset  . CAD Mother   . High blood pressure Mother   . Heart attack Mother   . Leukemia Father   . Early death Father   . Heart attack Sister   . Diabetes Sister   . Cancer Brother   . Deep vein thrombosis Brother   . Hypertension Daughter   . Stroke Sister   . Stroke Sister   . Heart attack Sister   . Deep vein thrombosis Sister   . Hypertension Sister   . Diabetes Sister   . Arthritis Sister   . Arthritis Sister   . Arthritis Sister   . Hypertension Sister   . Heart attack Sister   . Cancer Brother        KIDNEY  . Kidney disease Brother   . Deep vein thrombosis Brother   . Seizures Brother   . Heart attack Brother   . Colon cancer Neg Hx   . Colon polyps Neg Hx    Social History   Socioeconomic History  . Marital status: Married    Spouse name: [name]  . Number of children: 2  . Years of education: GED  . Highest education level: Not on file  Occupational History  . 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 Smoking status: Every Day    Packs/day: 1.00    Years: 45.00    Pack years: 45.00    Types: Cigarettes    Last attempt to quit: 02/28/2017    Years since quitting: 3.6  . Smokeless tobacco: Never  . Tobacco comments:    started back - pack per day  Vaping Use  . Vaping Use: Never used  Substance and Sexual Activity  . Alcohol use: Yes    Alcohol/week: 0.0 standard drinks    Comment: occas  . Drug use: No  . Sexual activity: Yes    Birth control/protection: Post-menopausal  Other Topics Concern  . Not on file  Social History Narrative   Lives with wife [name]   Leisure - couch potato   Social Determinants of Health   Financial Resource Strain: Not on file  Food Insecurity: Not on file  Transportation Needs: Not on file  Physical Activity: Not on file  Stress: Not on file  Social Connections: Not on file   No Known  Allergies  Medications  (Not in a hospital admission)    Vitals   Vitals:   11/03/20 0629 11/03/20 0630 11/03/20 0639 11/03/20 0655  BP: (!) 181/119  (!) 170/98 (!) 169/96  Pulse:  (!) 116 (!) 106 87  Resp:  20 (!) 22 20  Temp:      TempSrc:      SpO2:  94% 90% 94%  Weight:         Body mass index is 26.19 kg/m.  Physical Exam   General: Laying comfortably in bed; in no acute distress.  HENT: Normal oropharynx and mucosa. Normal external appearance of ears and nose.  Neck: Supple, no pain or tenderness  CV: No JVD. No peripheral edema.  Pulmonary: Symmetric Chest rise. Normal respiratory effort.  Abdomen: Soft to touch, non-tender.  Ext: No cyanosis, edema, or deformity  Skin: No rash. Normal palpation of skin.   Musculoskeletal: Normal digits and nails by inspection. No clubbing.   Neurologic Examination  Mental status/Cognition: Alert, oriented to self, place, month and year, good attention.  Speech/language: mildly dysarthric, fluent, comprehension intact, object naming intact, repetition intact.  Cranial nerves:   CN II Pupils equal and reactive to light, L hemianopsia.   CN III,IV,VI R gaze preference.   CN V Decreased in Left face.   CN VII Mild R nasolabial fold asymmetry.   CN VIII normal hearing to speech    CN IX & X normal palatal elevation, no uvular deviation   CN XI 5/5 head turn and 5/5 shoulder shrug bilaterally   CN XII midline tongue protrusion   Motor:  Muscle bulk: normal, tone flaccid in LUE Mvmt Root Nerve  Muscle Right Left Comments  SA C5/6 Ax Deltoid 5 0   EF C5/6 Mc Biceps 5 0   EE C6/7/8 Rad Triceps 5 0   WF C6/7 Med FCR     WE C7/8 PIN ECU     F Ab C8/T1 U ADM/FDI 5 0   HF L1/2/3 Fem Illopsoas 5 3   KE L2/3/4 Fem Quad 5 4   DF L4/5 D Peron Tib Ant 5 4   PF S1/2 Tibial Grc/Sol 5 4    Sensation:  Light touch Decreased in L face, L arm and L leg.   Pin prick    Temperature    Vibration   Proprioception     Coordination/Complex Motor:  - Finger to Nose intact on the right, unable to do do on the left - Heel to shin with ataxia in LLE - Rapid alternating movement unable to do in LUE - Gait: unsfe to assess given the L sided weakness.  Labs   CBC:  Recent Labs  Lab 11/03/20 0603 11/03/20 0622  WBC 15.1*  --   NEUTROABS 13.8*  --   HGB 16.8 17.3*  HCT 47.1 51.0  MCV 93.8  --   PLT 210  --     Basic Metabolic Panel:  Lab Results  Component Value Date   NA 136 11/03/2020   K 3.8 11/03/2020   CO2 30 08/15/2019   GLUCOSE 201 (H) 11/03/2020   BUN 13 11/03/2020   CREATININE 0.70 11/03/2020   CALCIUM 10.5 (H) 08/15/2019   GFRNONAA 83 06/23/2018   GFRAA 95 06/23/2018   Lipid Panel:  Lab Results  Component Value Date   LDLCALC 77 08/15/2019   HgbA1c:  Lab Results  Component Value Date   HGBA1C 5.0 08/15/2019   Urine Drug Screen: No results found for: LABOPIA, COCAINSCRNUR, LABBENZ, AMPHETMU, THCU, LABBARB  Alcohol Level No results found for: [place]  CT Head without contrast: Personally reviewed and notable for hyperdense R MCA sign. ASPECTS of 10.  CT angio Head and  Neck with contrast: Personally reviewed and notable for R MCA M1 occlusion, no aortic dissection.  MRI Brain: pending Impression   Christopher Lang is a 65 y.o. male with PMH significant for glaucoma, COPD smoker, HLD, OSA, who presents with chest pain along with sudden onset left sided weakness. Found to have anterior STEMI and R MCA M1 occlusion. His neurologic examination is notable for flaccid LUE paralysis, L hemianopsia and some LLE weakness. tNKase given and taken for thrombectomy. Cardiology following.  Primary Diagnosis:  Cerebral infarction due to embolism of  right middle cerebral artery.   Secondary Diagnosis: Essential (primary) hypertension and Paroxysmal atrial fibrillation Anterior STEMI.  Recommendations   R MCA stroke with R M1 occlusion s/p tNKase and thrombectomy: Plan: - Frequent  NeuroChecks for post tNK care per stroke unit protocol: - Initial CTH demonstrated no acute hemorrhage or mass - MRI Brain - pending - CTH - no ICH but hyperdense R MCA. - CTA - R MCA M1 occlusion. - TTE - pending. - Lipid Panel: LDL - pending.  - Statin: pending. - HbA1c: pending. - Antithrombotic: Start ASA 81 mg daily if 24 h CTH does not show acute hemorrhage - DVT prophylaxis: SCDs. Pharmacologic prophylaxis if 24 h CTH does not demonstrate acute hemorrhage - Smoking cessation: discussed with wife that he absolutely needs to quit smoking to reduce risk of strokes in the future. -  Blood pressure goal per Neuro IR for the first 24 hours after thrombectomy. - Telemetry monitoring for arrhythmia: 72 hours - Swallow screen - ordered - PT/OT/SLP consults  Anterior STEMI: - LHC/PCI felt high risk by cards with concern for hemorrhagic conversion of stroke. - recommend medical management with Aspirin 81mg  daily along with plavix 75mg  daily, metoprolol 25mg  BID and atorvastatin 80mg  daily. - TTE as above.  Smoker and COPD: - nicotine patch PRN. Will need to counsel him on the importance of quitting smoking when stable. I discussed with wife.  HTN: - Goal blood pressure per Neuro IR for the first 24 hours post thrombectomy.   ______________________________________________________________________  This patient is critically ill and at significant risk of neurological worsening, death and care requires constant monitoring of vital signs, hemodynamics,respiratory and cardiac monitoring, neurological assessment, discussion with family, other specialists and medical decision making of high complexity. I spent 90 minutes of neurocritical care time  in the care of  this patient. This was time spent independent of any time provided by nurse practitioner or PA.  Donnetta Simpers [facility] Pager Number [phone] 11/03/2020  8:10 AM   Signed,  Donnetta Simpers [facility] Pager Number [phone] _ _ _   _ __   _ __ _ _  __ __   _ __   __ _

## 2020-11-03 NOTE — Transfer of Care (Signed)
Immediate Anesthesia Transfer of Care Note  Patient: Christopher Lang  Procedure(s) Performed: RADIOLOGY WITH ANESTHESIA  Patient Location: ICU  Anesthesia Type:General  Level of Consciousness: sedated and Patient remains intubated per anesthesia plan  Airway & Oxygen Therapy: Patient remains intubated per anesthesia plan and Patient placed on Ventilator (see vital sign flow sheet for setting)  Post-op Assessment: Report given to RN and Post -op Vital signs reviewed and stable  Post vital signs: Reviewed and stable  Last Vitals:  Vitals Value Taken Time  BP 149/92 11/03/20 0915  Temp    Pulse 112 11/03/20 0920  Resp 17 11/03/20 0920  SpO2 96 % 11/03/20 0920  Vitals shown include unvalidated device data.  Last Pain:  Vitals:   11/03/20 0618  TempSrc: Temporal         Complications: No notable events documented.

## 2020-11-03 NOTE — Anesthesia Preprocedure Evaluation (Signed)
Anesthesia Evaluation  Patient identified by MRN, date of birth, ID band Patient unresponsive    Reviewed: Unable to perform ROS - Chart review onlyPreop documentation limited or incomplete due to emergent nature of procedure.  Airway Mallampati: II   Neck ROM: Full    Dental  (+) Upper Dentures, Lower Dentures   Pulmonary Current Smoker and Patient abstained from smoking.,    Pulmonary exam normal        Cardiovascular hypertension, Pt. on medications Normal cardiovascular exam  IMPRESSIONS    1. Left ventricular ejection fraction, by estimation, is 60 to 65%. The  left ventricle has normal function. The left ventricle has no regional  wall motion abnormalities. Left ventricular diastolic parameters were  normal.  2. Right ventricular systolic function is normal. The right ventricular  size is normal. There is normal pulmonary artery systolic pressure.  3. The mitral valve is degenerative. Trivial mitral valve regurgitation.  4. On visual inspection, there appears to be at least mild calcific  aortic stenosis (albeit hemodynamics do not support this). The aortic  valve is tricuspid. Aortic valve regurgitation is not visualized. Aortic  valve area, by VTI measures 2.21 cm.  Aortic valve mean gradient measures 6.0 mmHg. Aortic valve Vmax measures  1.68 m/s.  5. The inferior vena cava is normal in size with greater than 50%  respiratory variability, suggesting right atrial pressure of 3 mmHg.   Comparison(s): Echocardiogram done 12/03/15 showed an EF of 65%.    Neuro/Psych CVA    GI/Hepatic   Endo/Other    Renal/GU      Musculoskeletal   Abdominal   Peds  Hematology   Anesthesia Other Findings   Reproductive/Obstetrics                             Anesthesia Physical Anesthesia Plan  ASA: 4 and emergent  Anesthesia Plan: General   Post-op Pain Management:    Induction:  Intravenous  PONV Risk Score and Plan: Ondansetron and Treatment may vary due to age or medical condition  Airway Management Planned: Oral ETT  Additional Equipment: Arterial line  Intra-op Plan:   Post-operative Plan: Possible Post-op intubation/ventilation  Informed Consent: I have reviewed the patients History and Physical, chart, labs and discussed the procedure including the risks, benefits and alternatives for the proposed anesthesia with the patient or authorized representative who has indicated his/her understanding and acceptance.       Plan Discussed with: Anesthesiologist, CRNA and Surgeon  Anesthesia Plan Comments:         Anesthesia Quick Evaluation

## 2020-11-03 NOTE — Progress Notes (Signed)
Interventional cardiology:  64 yo BM with Code STEMI. EMS called to home for chest pain beginning this morning. Ecg shows Anterolateral STEMI. Patient also found to have dense left hemiplegia and neglect c/w acute CVA. He is hypertensive. History of Afib in 2017 without recurrence. HTN and mild AS.   Emergent CT done showing no acute IC bleed and no aortic dissection. In consultation with Neuro plan is to give the patient lytic therapy for his CVA with IV TNK. He is not a candidate for emergent cardiac cath due to acute CVA and receiving lytic therapy.  Burt Piatek [place] MD, [facility]

## 2020-11-03 NOTE — Progress Notes (Signed)
NeuroInterventional Radiology  Pre-Procedure Note  History: 65 yo male presenting to ED with chest pain, nausea, arriving with new onset left sided weakness.  Diagnosis of acute coronary syndrome/STEMI and acute stroke/ELVO of right M1.   Presents for thrombectomy.   Baseline mRS: 0 NIHSS:  11  CT ASPECTS:   9 CTA:   Right M1 occlusion   I have discussed the case with Dr. Lorrin Goodell of Stroke Neurology.  Given the patient's symptoms, imaging findings, baseline function, we agree they are an appropriate candidate for attempt for mechanical thrombectomy.    The risks and benefits of the procedure were discussed with the patient/patient's family on multi-participant phone call including Neurology team, with specific risks including: bleeding, infection, arterial injury/dissection, contrast reaction, kidney injury, need for further procedure/surgery, neurologic deficit, 10-15% risk of intracranial hemorrhage, cardiopulmonary collapse, death. All questions were answered.  The patient/family would like to proceed with attempt at thrombectomy, possible other intervention.    Plan for cerebral angiogram and attempt at mechanical thrombectomy.   Signed,   Dulcy Fanny. Earleen Newport, DO

## 2020-11-03 NOTE — ED Notes (Signed)
Code stroke activated per [facility]. LKW 0230, L sided deficits

## 2020-11-03 NOTE — Anesthesia Postprocedure Evaluation (Signed)
Anesthesia Post Note  Patient: Christopher Lang  Procedure(s) Performed: RADIOLOGY WITH ANESTHESIA     Patient location during evaluation: SICU Anesthesia Type: General Level of consciousness: sedated Pain management: pain level controlled Vital Signs Assessment: post-procedure vital signs reviewed and stable Respiratory status: patient remains intubated per anesthesia plan Cardiovascular status: stable Postop Assessment: no apparent nausea or vomiting Anesthetic complications: no   No notable events documented.  Last Vitals:  Vitals:   11/03/20 0655 11/03/20 0913  BP: (!) 169/96   Pulse: 87   Resp: 20   Temp:    SpO2: 94% 95%    Last Pain:  Vitals:   11/03/20 0618  TempSrc: Temporal                 Iasia Forcier DANIEL

## 2020-11-04 ENCOUNTER — Inpatient Hospital Stay (HOSPITAL_COMMUNITY): Payer: Medicare Other

## 2020-11-04 ENCOUNTER — Encounter (HOSPITAL_COMMUNITY): Payer: Self-pay | Admitting: Radiology

## 2020-11-04 DIAGNOSIS — I63511 Cerebral infarction due to unspecified occlusion or stenosis of right middle cerebral artery: Secondary | ICD-10-CM | POA: Diagnosis not present

## 2020-11-04 DIAGNOSIS — I6523 Occlusion and stenosis of bilateral carotid arteries: Secondary | ICD-10-CM

## 2020-11-04 DIAGNOSIS — I2109 ST elevation (STEMI) myocardial infarction involving other coronary artery of anterior wall: Secondary | ICD-10-CM | POA: Diagnosis not present

## 2020-11-04 DIAGNOSIS — I10 Essential (primary) hypertension: Secondary | ICD-10-CM | POA: Diagnosis not present

## 2020-11-04 DIAGNOSIS — E782 Mixed hyperlipidemia: Secondary | ICD-10-CM

## 2020-11-04 DIAGNOSIS — I255 Ischemic cardiomyopathy: Secondary | ICD-10-CM

## 2020-11-04 DIAGNOSIS — I639 Cerebral infarction, unspecified: Secondary | ICD-10-CM | POA: Diagnosis not present

## 2020-11-04 HISTORY — PX: IR PTA INTRACRANIAL: IMG2344

## 2020-11-04 LAB — CBC
HCT: 39.3 % (ref 39.0–52.0)
Hemoglobin: 13.6 g/dL (ref 13.0–17.0)
MCH: 33.7 pg (ref 26.0–34.0)
MCHC: 34.6 g/dL (ref 30.0–36.0)
MCV: 97.3 fL (ref 80.0–100.0)
Platelets: 145 10*3/uL — ABNORMAL LOW (ref 150–400)
RBC: 4.04 MIL/uL — ABNORMAL LOW (ref 4.22–5.81)
RDW: 12.6 % (ref 11.5–15.5)
WBC: 12.3 10*3/uL — ABNORMAL HIGH (ref 4.0–10.5)
nRBC: 0 % (ref 0.0–0.2)

## 2020-11-04 LAB — LIPID PANEL
Cholesterol: 159 mg/dL (ref 0–200)
HDL: 31 mg/dL — ABNORMAL LOW (ref >40)
LDL Cholesterol: 91 mg/dL (ref 0–99)
Total CHOL/HDL Ratio: 5.1 RATIO
Triglycerides: 187 mg/dL — ABNORMAL HIGH (ref <150)
VLDL: 37 mg/dL (ref 0–40)

## 2020-11-04 LAB — HEPARIN LEVEL (UNFRACTIONATED): Heparin Unfractionated: 0.36 IU/mL (ref 0.30–0.70)

## 2020-11-04 LAB — BASIC METABOLIC PANEL
Anion gap: 8 (ref 5–15)
BUN: 11 mg/dL (ref 8–23)
CO2: 22 mmol/L (ref 22–32)
Calcium: 9.5 mg/dL (ref 8.9–10.3)
Chloride: 105 mmol/L (ref 98–111)
Creatinine, Ser: 0.89 mg/dL (ref 0.61–1.24)
GFR, Estimated: >60 mL/min (ref >60)
Glucose, Bld: 121 mg/dL — ABNORMAL HIGH (ref 70–99)
Potassium: 3.8 mmol/L (ref 3.5–5.1)
Sodium: 135 mmol/L (ref 135–145)

## 2020-11-04 LAB — HIV ANTIBODY (ROUTINE TESTING W REFLEX): HIV Screen 4th Generation wRfx: NONREACTIVE

## 2020-11-04 LAB — GLUCOSE, CAPILLARY
Glucose-Capillary: 104 mg/dL — ABNORMAL HIGH (ref 70–99)
Glucose-Capillary: 105 mg/dL — ABNORMAL HIGH (ref 70–99)
Glucose-Capillary: 108 mg/dL — ABNORMAL HIGH (ref 70–99)
Glucose-Capillary: 120 mg/dL — ABNORMAL HIGH (ref 70–99)
Glucose-Capillary: 121 mg/dL — ABNORMAL HIGH (ref 70–99)
Glucose-Capillary: 95 mg/dL (ref 70–99)

## 2020-11-04 LAB — TROPONIN I (HIGH SENSITIVITY): Troponin I (High Sensitivity): >24000 ng/L (ref <18)

## 2020-11-04 MED ORDER — ATORVASTATIN CALCIUM 40 MG PO TABS
40.0000 mg | ORAL_TABLET | Freq: Every day | ORAL | Status: DC
  Administered 2020-11-04 – 2020-11-07 (×4): 40 mg via ORAL
  Filled 2020-11-04 (×4): qty 1

## 2020-11-04 MED ORDER — CLOPIDOGREL BISULFATE 75 MG PO TABS
75.0000 mg | ORAL_TABLET | Freq: Every day | ORAL | Status: DC
  Administered 2020-11-04 – 2020-11-08 (×5): 75 mg via ORAL
  Filled 2020-11-04 (×5): qty 1

## 2020-11-04 MED ORDER — ENOXAPARIN SODIUM 40 MG/0.4ML IJ SOSY
40.0000 mg | PREFILLED_SYRINGE | Freq: Every day | INTRAMUSCULAR | Status: DC
  Administered 2020-11-04: 40 mg via SUBCUTANEOUS
  Filled 2020-11-04: qty 0.4

## 2020-11-04 MED ORDER — ASPIRIN EC 81 MG PO TBEC: 81.0000 mg | DELAYED_RELEASE_TABLET | Freq: Every day | ORAL | Status: DC

## 2020-11-04 MED ORDER — HEPARIN (PORCINE) 25000 UT/250ML-% IV SOLN
1600.0000 [IU]/h | INTRAVENOUS | Status: AC
  Administered 2020-11-04 – 2020-11-06 (×3): 1150 [IU]/h via INTRAVENOUS
  Administered 2020-11-07: 1450 [IU]/h via INTRAVENOUS
  Filled 2020-11-04 (×4): qty 250

## 2020-11-04 NOTE — Progress Notes (Signed)
Referring Physician(s): Code stroke  Supervising Physician: Corrie Mckusick  Patient Status:  [facility] - In-pt  Chief Complaint: Follow up right ICA angioplasty without stent placement and right distal M1 thrombectomy.  Subjective:  Patient laying in bed, denies complaints, wife at bedside. Extubated yesterday, passed swallow evaluation. Per RN arterial line remains, no concerns about puncture site. Cardiology assessed patient due to anteriolateral STEMI, recommending DAPT.   Allergies: Patient has no known allergies.  Medications: Prior to Admission medications   Medication Sig Start Date End Date Taking? Authorizing Provider  aspirin EC 81 MG tablet Take 81 mg by mouth daily.   Yes [provider]  atorvastatin (LIPITOR) 10 MG tablet Take 1 tablet (10 mg total) by mouth daily at 6 PM. 03/09/19  Yes Dettinger, Fransisca Kaufmann, MD  cholecalciferol (VITAMIN D) 1000 units tablet Take 1,000 Units by mouth daily.   Yes [provider]  cilostazol (PLETAL) 100 MG tablet TAKE 1 TABLET BY MOUTH TWICE A DAY Patient taking differently: Take 100 mg by mouth 2 (two) times daily. 05/20/20  Yes Verta Ellen., NP  diltiazem (CARDIZEM CD) 240 MG 24 hr capsule TAKE 1 CAPSULE BY MOUTH EVERY DAY Patient taking differently: Take 240 mg by mouth daily. 05/20/20  Yes Satira Sark, MD  fish oil-omega-3 fatty acids 1000 MG capsule Take 1,000 mg by mouth daily.     Yes [provider]  pantoprazole (PROTONIX) 40 MG tablet Take 1 tablet (40 mg total) by mouth daily before breakfast. Patient not taking: No sig reported 03/09/19   Dettinger, Fransisca Kaufmann, MD     Vital Signs: BP 136/75   Pulse 64   Temp 98.5 F (36.9 C) (Axillary)   Resp 19   Ht 6' (1.829 m)   Wt 193 lb 2 oz (87.6 kg)   SpO2 96%   BMI 26.19 kg/m   Physical Exam Vitals and nursing note reviewed.  Constitutional:      General: He is not in acute distress. HENT:     Head: Normocephalic.  Cardiovascular:      Rate and Rhythm: Normal rate.     Comments: Right CFA puncture site clean, dry, dressed appropriately. Soft, non tender, non pulsatile.  Left CFA puncture site clean, dry, dressed appropriately - art line in place with good waveform. Pulmonary:     Effort: Pulmonary effort is normal.  Skin:    General: Skin is warm and dry.  Neurological:     Mental Status: He is alert.  Alert, awake Speech and comprehension in tact PERRL EOMs without obvious nystagmus or subjective diplopia. Visual fields grossly in tact No obvious facial asymmetry. Tongue midline Motor power full all 4 extremities (limited exam)    Imaging: MR ANGIO HEAD WO CONTRAST  Result Date: 11/04/2020 CLINICAL DATA:  Stroke follow-up EXAM: MRI HEAD WITHOUT CONTRAST MRA HEAD WITHOUT CONTRAST TECHNIQUE: Multiplanar, multi-echo pulse sequences of the brain and surrounding structures were acquired without intravenous contrast. Angiographic images of the Circle of Willis were acquired using MRA technique without intravenous contrast. COMPARISON:  No pertinent prior exam. FINDINGS: MRI HEAD FINDINGS Brain: Patchy acute cortical infarct along the right MCA and border zone territory but also seen to a lesser extent in the white matter and striatum on the right. Less extensive acute infarcts in the lateral left frontal lobe, MCA territory. Pre-existing cortical infarcts asymmetric to the left. Petechial hemorrhage on the right and hemosiderin staining on the left. No hydrocephalus, collection, or shift Vascular:  Absent left ICA flow void Skull and upper cervical spine: Normal marrow signal Sinuses/Orbits: Negative MRA HEAD FINDINGS Anterior circulation: Faint flow related signal in the left cervical ICA but none seen at the skull base or intracranially until ramification by the left posterior communicating artery. The right M1 and M2 branches are widely patent. No new embolus. Posterior circulation: Dominant right vertebral artery. The distal  basilar divides at the level of the superior cerebellar arteries. No branch occlusion or aneurysm Anatomic variants: None significant IMPRESSION: 1. Patchy acute cerebral infarcts on the right more than left with right-sided petechial hemorrhage. 2. No residual or recurrent right MCA thrombus. 3. No detected flow in the intracranial left ICA but well compensated by the left posterior communicating artery. Chronic critical stenosis with underfilling of the left ICA bulb. Electronically Signed   By: Jorje Guild M.D.   On: 11/04/2020 08:09   MR BRAIN WO CONTRAST  Result Date: 11/04/2020 CLINICAL DATA:  Stroke follow-up EXAM: MRI HEAD WITHOUT CONTRAST MRA HEAD WITHOUT CONTRAST TECHNIQUE: Multiplanar, multi-echo pulse sequences of the brain and surrounding structures were acquired without intravenous contrast. Angiographic images of the Circle of Willis were acquired using MRA technique without intravenous contrast. COMPARISON:  No pertinent prior exam. FINDINGS: MRI HEAD FINDINGS Brain: Patchy acute cortical infarct along the right MCA and border zone territory but also seen to a lesser extent in the white matter and striatum on the right. Less extensive acute infarcts in the lateral left frontal lobe, MCA territory. Pre-existing cortical infarcts asymmetric to the left. Petechial hemorrhage on the right and hemosiderin staining on the left. No hydrocephalus, collection, or shift Vascular: Absent left ICA flow void Skull and upper cervical spine: Normal marrow signal Sinuses/Orbits: Negative MRA HEAD FINDINGS Anterior circulation: Faint flow related signal in the left cervical ICA but none seen at the skull base or intracranially until ramification by the left posterior communicating artery. The right M1 and M2 branches are widely patent. No new embolus. Posterior circulation: Dominant right vertebral artery. The distal basilar divides at the level of the superior cerebellar arteries. No branch occlusion or  aneurysm Anatomic variants: None significant IMPRESSION: 1. Patchy acute cerebral infarcts on the right more than left with right-sided petechial hemorrhage. 2. No residual or recurrent right MCA thrombus. 3. No detected flow in the intracranial left ICA but well compensated by the left posterior communicating artery. Chronic critical stenosis with underfilling of the left ICA bulb. Electronically Signed   By: Jorje Guild M.D.   On: 11/04/2020 08:09   IR PTA Intracranial  Result Date: 11/04/2020 INDICATION: 65 year old male presents with acute right M1 occlusion, emergent large vessel occlusion, for mechanical thrombectomy EXAM: ULTRASOUND-GUIDED ACCESS RIGHT COMMON FEMORAL ARTERY ULTRASOUND-GUIDED ACCESS LEFT COMMON FEMORAL ARTERY RIGHT CERVICAL AND CEREBRAL ANGIOGRAM BALLOON ANGIOPLASTY CRITICAL STENOSIS RIGHT ICA RIGHT M1 MECHANICAL THROMBECTOMY ANGIO-SEAL FOR HEMOSTASIS. COMPARISON:  CT IMAGING SAME DAY MEDICATIONS: Preoperative T NK ANESTHESIA/SEDATION: The anesthesia team was present to provide general endotracheal tube anesthesia and for patient monitoring during the procedure. Intubation was performed in biplane/neuro interventional suite. Left radial arterial line was performed by the anesthesia team. Interventional neuro radiology nursing staff was also present. CONTRAST:  70 cc omni 350 FLUOROSCOPY TIME:  Fluoroscopy Time: 17 minutes 48 seconds (1029 mGy). COMPLICATIONS: None TECHNIQUE: Informed written consent was obtained from the patient's family after a thorough discussion of the procedural risks, benefits and alternatives. Specific risks discussed include: Bleeding, infection, contrast reaction, kidney injury/failure, need for further procedure/surgery, arterial injury  or dissection, embolization to new territory, intracranial hemorrhage (10-15% risk), neurologic deterioration, cardiopulmonary collapse, death. All questions were addressed. Maximal Sterile Barrier Technique was utilized  including during the procedure including caps, mask, sterile gowns, sterile gloves, sterile drape, hand hygiene and skin antiseptic. A timeout was performed prior to the initiation of the procedure. The anesthesia team was present to provide general endotracheal tube anesthesia and for patient monitoring during the procedure. Interventional neuro radiology nursing staff was also present. FINDINGS: Initial Findings: Right common carotid artery:  Normal course caliber and contour. Right external carotid artery: Patent with antegrade flow. Atherosclerotic plaque extends into the proximal ECA with estimated 50 percent narrowing at the origin. Right internal carotid artery: Advanced atherosclerotic changes at the right carotid bifurcation, with at least 50 percent circumferential calcified plaque in the proximal right ICA, and extension of plaque into the proximal right external carotid artery. By NASCET criteria, the initial stenosis measures at least 81 percent . After balloon angioplasty, stenosis measures less than 50 percent. Right MCA: Since the prior CT, there has been migration of the thrombus into the inferior division, with a superior division frontal branch filling. There is some contamination on the initial angiogram by the left-sided MCA branches given the significant right to left cross fill of the patent anterior communicating artery. Right ACA: A 1 segment patent. A 2 segment perfuses the right territory. Significant cross fill to the left. Completion Findings: Right MCA: TICI 3: Complete perfusion of the territory Significant cross fill persists from the right to the left. Less than 50 percent stenosis at the cervical ICA origin. PROCEDURE: The anesthesia team was present to provide general endotracheal tube anesthesia and for patient monitoring during the procedure. Intubation was performed in negative pressure [place] in neuro IR holding. Interventional neuro radiology nursing staff was also present.  Ultrasound survey of the left inguinal region was performed with images stored and sent to PACs, confirming patency of the vessel. A micropuncture needle was used access the left common femoral artery under ultrasound. With excellent arterial blood flow returned, and an .018 micro wire was passed through the needle, observed enter the abdominal aorta under fluoroscopy. The needle was removed, and a micropuncture sheath was placed over the wire. The inner dilator and wire were removed, and an 035 Bentson wire was advanced under fluoroscopy into the abdominal aorta. The sheath was removed and a standard 4 [place] vascular sheath was placed. The dilator was removed and the sheath was flushed. Ultrasound survey of the right inguinal region was performed with images stored and sent to PACs. Patency of the right common femoral artery confirmed. 11 blade scalpel was used to make a small incision. Blunt dissection was performed with US guidance. A micropuncture needle was used access the right common femoral artery under ultrasound. With excellent arterial blood flow returned, an .018 micro wire was passed through the needle, observed to enter the abdominal aorta under fluoroscopy. The needle was removed, and a micropuncture sheath was placed over the wire. The inner dilator and wire were removed, and an 035 wire was advanced under fluoroscopy into the abdominal aorta. The sheath was removed and a 25cm 25F straight vascular sheath was placed. The dilator was removed and the sheath was flushed. Sheath was attached to pressurized and heparinized saline bag for constant forward flow. JB 1 catheter was advanced with a Glidewire to the aortic arch. The Glidewire was removed and a double flush was performed. JB 1 was then used to select the  innominate artery and the right common carotid artery origin. Catheter was advanced to the distal common carotid artery and the wire was removed. Angiogram was performed. Once we assessed the  significant stenosis in the proximal right ICA, we elected to proceed 1st with balloon angioplasty. Exchange length roadrunner was advanced into the external carotid artery branches and the JB 1 was removed. A coaxial system was then advanced over the 035 wire. This included a 95cm 087 "Walrus" balloon guide with coaxial 100cm JB 1 diagnostic catheter. This was advanced to the distal common carotid artery. Wire and catheter were then removed. Double flush of the balloon guide was performed. Angiogram was performed for roadmap technique. Using the roadmap technique, A synchro soft wire and rapid transit catheter were used to cross the irregular stenotic plaque of the right ICA. Catheter wire were advanced into the distal cervical ICA and the wire was removed. A floppy transcend exchange length wire was then placed through the catheter and the catheter was removed. We then proceeded with balloon angioplasty of the cervical ICA segment with a 5 mm x 30 mm Viatrac rapid exchange system. Ten atmospheric pressure was observed. As the balloon was slowly deflated, the balloon guide was advanced over the balloon and wire combination to the distal cervical segment. The balloon and wire were then removed from the system and double flush was performed at the balloon guide. Formal angiogram was performed. Road map function was used once the occluded vessel was identified. Copious back flush was performed and the balloon catheter was attached to heparinized and pressurized saline bag for forward flow. A second coaxial system was then advanced through the balloon catheter, which included the selected intermediate catheter, microcatheter, and microwire. In this scenario, the set up included a 132cm CAT-5 intermediate catheter, a Trevo Provue18 microcatheter, and 014 synchro soft wire. This system was advanced through the balloon guide catheter under the road-map function, with adequate back-flush at the rotating hemostatic valve at  that back end of the balloon guide. Microcatheter and the intermediate catheter system were advanced through the terminal ICA and MCA to the level of the occlusion. The micro wire was then carefully advanced through the occluded segment. Microcatheter was then manipulated through the occluded segment and the wire was removed with saline drip at the hub. Intermediate catheter was advanced into the proximal M1 segment. Blood was then aspirated through the hub of the microcatheter, and a gentle contrast injection was performed confirming intraluminal position. A rotating hemostatic valve was then attached to the back end of the microcatheter, and a pressurized and heparinized saline bag was attached to the catheter. 4 x 40 solitaire device was then selected. Back flush was achieved at the rotating hemostatic valve, and then the device was gently advanced through the microcatheter to the distal end. The retriever was then unsheathed by withdrawing the microcatheter under fluoroscopy. Once the retriever was completely unsheathed, the microcatheter was carefully stripped from the delivery device. Control angiogram was performed from the intermediate catheter. A 3 minute time interval was observed. The intermediate catheter was then attached to the proprietary suction engine and the catheter was slowly advanced over the solitaire until we observed stasis of flow within the intermediate catheter. The balloon at the balloon guide catheter was then inflated under fluoroscopy for proximal flow arrest. Constant aspiration using the proprietary engine was then performed at the intermediate catheter, as the retriever was gently and slowly withdrawn with fluoroscopic observation. Once the retriever was "corked" within the  tip of the intermediate catheter, both were removed from the system. Free aspiration was confirmed at the hub of the balloon guide catheter, with free blood return confirmed. The balloon was then deflated, and a  control angiogram was performed. Restoration of flow was confirmed. Balloon guide was withdrawn to the common carotid artery segment after placing the transcend wire to the distal cervical segment, to maintain wire cross the ICA origin. Angiogram of the cervical ICA was performed. Given the improved diameter on the conclusion, we elected not to deployed stent at this time, as that would require dual anti-platelet therapy. Balloon guide and wire were then removed. The skin at the puncture site was then cleaned with Chlorhexidine. The 8 French sheath was removed and an 24F angioseal was deployed. Flat panel CT was performed. Patient tolerated the procedure well and remained hemodynamically stable throughout. No complications were encountered and no significant blood loss encountered. IMPRESSION: Status post ultrasound guided access right common femoral artery for right-sided cervical and cerebral angiogram, balloon angioplasty of high-grade right ICA stenosis and mechanical thrombectomy of right M1 occlusion, achieving TICI 3 flow. Angio-Seal for hemostasis. Status post ultrasound-guided access left common femoral artery for placement of arterial monitoring line for anesthesia/critical care. Signed, Dulcy Fanny. Dellia Nims, RPVI [facility] Radiology PLAN: The patient will remain intubated, given the critical care status ICU status Target systolic blood pressure of 120-140 Right hip straight time 6 hours Left common femoral artery monitoring line. Frequent neurovascular checks Repeat neurologic imaging with CT and/MRI at the discretion of neurology team Electronically Signed   By: Corrie Mckusick D.O.   On: 11/04/2020 08:46   IR CT Head Ltd  Result Date: 11/04/2020 INDICATION: 65 year old male presents with acute right M1 occlusion, emergent large vessel occlusion, for mechanical thrombectomy EXAM: ULTRASOUND-GUIDED ACCESS RIGHT COMMON FEMORAL ARTERY ULTRASOUND-GUIDED  ACCESS LEFT COMMON FEMORAL ARTERY RIGHT CERVICAL AND CEREBRAL ANGIOGRAM BALLOON ANGIOPLASTY CRITICAL STENOSIS RIGHT ICA RIGHT M1 MECHANICAL THROMBECTOMY ANGIO-SEAL FOR HEMOSTASIS. COMPARISON:  CT IMAGING SAME DAY MEDICATIONS: Preoperative T NK ANESTHESIA/SEDATION: The anesthesia team was present to provide general endotracheal tube anesthesia and for patient monitoring during the procedure. Intubation was performed in biplane/neuro interventional suite. Left radial arterial line was performed by the anesthesia team. Interventional neuro radiology nursing staff was also present. CONTRAST:  70 cc omni 350 FLUOROSCOPY TIME:  Fluoroscopy Time: 17 minutes 48 seconds (1029 mGy). COMPLICATIONS: None TECHNIQUE: Informed written consent was obtained from the patient's family after a thorough discussion of the procedural risks, benefits and alternatives. Specific risks discussed include: Bleeding, infection, contrast reaction, kidney injury/failure, need for further procedure/surgery, arterial injury or dissection, embolization to new territory, intracranial hemorrhage (10-15% risk), neurologic deterioration, cardiopulmonary collapse, death. All questions were addressed. Maximal Sterile Barrier Technique was utilized including during the procedure including caps, mask, sterile gowns, sterile gloves, sterile drape, hand hygiene and skin antiseptic. A timeout was performed prior to the initiation of the procedure. The anesthesia team was present to provide general endotracheal tube anesthesia and for patient monitoring during the procedure. Interventional neuro radiology nursing staff was also present. FINDINGS: Initial Findings: Right common carotid artery:  Normal course caliber and contour. Right external carotid artery: Patent with antegrade flow. Atherosclerotic plaque extends into the proximal ECA with estimated 50 percent narrowing at the origin. Right internal carotid artery: Advanced atherosclerotic changes at the  right carotid bifurcation, with at least 50 percent circumferential calcified plaque in the proximal right ICA, and extension of plaque into the  proximal right external carotid artery. By NASCET criteria, the initial stenosis measures at least 81 percent . After balloon angioplasty, stenosis measures less than 50 percent. Right MCA: Since the prior CT, there has been migration of the thrombus into the inferior division, with a superior division frontal branch filling. There is some contamination on the initial angiogram by the left-sided MCA branches given the significant right to left cross fill of the patent anterior communicating artery. Right ACA: A 1 segment patent. A 2 segment perfuses the right territory. Significant cross fill to the left. Completion Findings: Right MCA: TICI 3: Complete perfusion of the territory Significant cross fill persists from the right to the left. Less than 50 percent stenosis at the cervical ICA origin. PROCEDURE: The anesthesia team was present to provide general endotracheal tube anesthesia and for patient monitoring during the procedure. Intubation was performed in negative pressure [place] in neuro IR holding. Interventional neuro radiology nursing staff was also present. Ultrasound survey of the left inguinal region was performed with images stored and sent to PACs, confirming patency of the vessel. A micropuncture needle was used access the left common femoral artery under ultrasound. With excellent arterial blood flow returned, and an .018 micro wire was passed through the needle, observed enter the abdominal aorta under fluoroscopy. The needle was removed, and a micropuncture sheath was placed over the wire. The inner dilator and wire were removed, and an 035 Bentson wire was advanced under fluoroscopy into the abdominal aorta. The sheath was removed and a standard 4 [place] vascular sheath was placed. The dilator was removed and the sheath was flushed. Ultrasound survey of the  right inguinal region was performed with images stored and sent to PACs. Patency of the right common femoral artery confirmed. 11 blade scalpel was used to make a small incision. Blunt dissection was performed with US guidance. A micropuncture needle was used access the right common femoral artery under ultrasound. With excellent arterial blood flow returned, an .018 micro wire was passed through the needle, observed to enter the abdominal aorta under fluoroscopy. The needle was removed, and a micropuncture sheath was placed over the wire. The inner dilator and wire were removed, and an 035 wire was advanced under fluoroscopy into the abdominal aorta. The sheath was removed and a 25cm 54F straight vascular sheath was placed. The dilator was removed and the sheath was flushed. Sheath was attached to pressurized and heparinized saline bag for constant forward flow. JB 1 catheter was advanced with a Glidewire to the aortic arch. The Glidewire was removed and a double flush was performed. JB 1 was then used to select the innominate artery and the right common carotid artery origin. Catheter was advanced to the distal common carotid artery and the wire was removed. Angiogram was performed. Once we assessed the significant stenosis in the proximal right ICA, we elected to proceed 1st with balloon angioplasty. Exchange length roadrunner was advanced into the external carotid artery branches and the JB 1 was removed. A coaxial system was then advanced over the 035 wire. This included a 95cm 087 "[place]" balloon guide with coaxial 100cm JB 1 diagnostic catheter. This was advanced to the distal common carotid artery. Wire and catheter were then removed. Double flush of the balloon guide was performed. Angiogram was performed for roadmap technique. Using the roadmap technique, A synchro soft wire and rapid transit catheter were used to cross the irregular stenotic plaque of the right ICA. Catheter wire were advanced into the  distal  cervical ICA and the wire was removed. A floppy transcend exchange length wire was then placed through the catheter and the catheter was removed. We then proceeded with balloon angioplasty of the cervical ICA segment with a 5 mm x 30 mm Viatrac rapid exchange system. Ten atmospheric pressure was observed. As the balloon was slowly deflated, the balloon guide was advanced over the balloon and wire combination to the distal cervical segment. The balloon and wire were then removed from the system and double flush was performed at the balloon guide. Formal angiogram was performed. Road map function was used once the occluded vessel was identified. Copious back flush was performed and the balloon catheter was attached to heparinized and pressurized saline bag for forward flow. A second coaxial system was then advanced through the balloon catheter, which included the selected intermediate catheter, microcatheter, and microwire. In this scenario, the set up included a 132cm CAT-5 intermediate catheter, a Trevo Provue18 microcatheter, and 014 synchro soft wire. This system was advanced through the balloon guide catheter under the road-map function, with adequate back-flush at the rotating hemostatic valve at that back end of the balloon guide. Microcatheter and the intermediate catheter system were advanced through the terminal ICA and MCA to the level of the occlusion. The micro wire was then carefully advanced through the occluded segment. Microcatheter was then manipulated through the occluded segment and the wire was removed with saline drip at the hub. Intermediate catheter was advanced into the proximal M1 segment. Blood was then aspirated through the hub of the microcatheter, and a gentle contrast injection was performed confirming intraluminal position. A rotating hemostatic valve was then attached to the back end of the microcatheter, and a pressurized and heparinized saline bag was attached to the catheter.  4 x 40 solitaire device was then selected. Back flush was achieved at the rotating hemostatic valve, and then the device was gently advanced through the microcatheter to the distal end. The retriever was then unsheathed by withdrawing the microcatheter under fluoroscopy. Once the retriever was completely unsheathed, the microcatheter was carefully stripped from the delivery device. Control angiogram was performed from the intermediate catheter. A 3 minute time interval was observed. The intermediate catheter was then attached to the proprietary suction engine and the catheter was slowly advanced over the solitaire until we observed stasis of flow within the intermediate catheter. The balloon at the balloon guide catheter was then inflated under fluoroscopy for proximal flow arrest. Constant aspiration using the proprietary engine was then performed at the intermediate catheter, as the retriever was gently and slowly withdrawn with fluoroscopic observation. Once the retriever was "corked" within the tip of the intermediate catheter, both were removed from the system. Free aspiration was confirmed at the hub of the balloon guide catheter, with free blood return confirmed. The balloon was then deflated, and a control angiogram was performed. Restoration of flow was confirmed. Balloon guide was withdrawn to the common carotid artery segment after placing the transcend wire to the distal cervical segment, to maintain wire cross the ICA origin. Angiogram of the cervical ICA was performed. Given the improved diameter on the conclusion, we elected not to deployed stent at this time, as that would require dual anti-platelet therapy. Balloon guide and wire were then removed. The skin at the puncture site was then cleaned with Chlorhexidine. The 8 French sheath was removed and an 58F angioseal was deployed. Flat panel CT was performed. Patient tolerated the procedure well and remained hemodynamically stable throughout. No  complications were encountered and no significant blood loss encountered. IMPRESSION: Status post ultrasound guided access right common femoral artery for right-sided cervical and cerebral angiogram, balloon angioplasty of high-grade right ICA stenosis and mechanical thrombectomy of right M1 occlusion, achieving TICI 3 flow. Angio-Seal for hemostasis. Status post ultrasound-guided access left common femoral artery for placement of arterial monitoring line for anesthesia/critical care. Signed, Dulcy Fanny. Dellia Nims, RPVI [facility] Radiology PLAN: The patient will remain intubated, given the critical care status ICU status Target systolic blood pressure of 120-140 Right hip straight time 6 hours Left common femoral artery monitoring line. Frequent neurovascular checks Repeat neurologic imaging with CT and/MRI at the discretion of neurology team Electronically Signed   By: Corrie Mckusick D.O.   On: 11/04/2020 08:46   IR US Guide Vasc Access Left  Result Date: 11/04/2020 INDICATION: 65 year old male presents with acute right M1 occlusion, emergent large vessel occlusion, for mechanical thrombectomy EXAM: ULTRASOUND-GUIDED ACCESS RIGHT COMMON FEMORAL ARTERY ULTRASOUND-GUIDED ACCESS LEFT COMMON FEMORAL ARTERY RIGHT CERVICAL AND CEREBRAL ANGIOGRAM BALLOON ANGIOPLASTY CRITICAL STENOSIS RIGHT ICA RIGHT M1 MECHANICAL THROMBECTOMY ANGIO-SEAL FOR HEMOSTASIS. COMPARISON:  CT IMAGING SAME DAY MEDICATIONS: Preoperative T NK ANESTHESIA/SEDATION: The anesthesia team was present to provide general endotracheal tube anesthesia and for patient monitoring during the procedure. Intubation was performed in biplane/neuro interventional suite. Left radial arterial line was performed by the anesthesia team. Interventional neuro radiology nursing staff was also present. CONTRAST:  70 cc omni 350 FLUOROSCOPY TIME:  Fluoroscopy Time: 17 minutes 48 seconds (1029 mGy). COMPLICATIONS: None  TECHNIQUE: Informed written consent was obtained from the patient's family after a thorough discussion of the procedural risks, benefits and alternatives. Specific risks discussed include: Bleeding, infection, contrast reaction, kidney injury/failure, need for further procedure/surgery, arterial injury or dissection, embolization to new territory, intracranial hemorrhage (10-15% risk), neurologic deterioration, cardiopulmonary collapse, death. All questions were addressed. Maximal Sterile Barrier Technique was utilized including during the procedure including caps, mask, sterile gowns, sterile gloves, sterile drape, hand hygiene and skin antiseptic. A timeout was performed prior to the initiation of the procedure. The anesthesia team was present to provide general endotracheal tube anesthesia and for patient monitoring during the procedure. Interventional neuro radiology nursing staff was also present. FINDINGS: Initial Findings: Right common carotid artery:  Normal course caliber and contour. Right external carotid artery: Patent with antegrade flow. Atherosclerotic plaque extends into the proximal ECA with estimated 50 percent narrowing at the origin. Right internal carotid artery: Advanced atherosclerotic changes at the right carotid bifurcation, with at least 50 percent circumferential calcified plaque in the proximal right ICA, and extension of plaque into the proximal right external carotid artery. By NASCET criteria, the initial stenosis measures at least 81 percent . After balloon angioplasty, stenosis measures less than 50 percent. Right MCA: Since the prior CT, there has been migration of the thrombus into the inferior division, with a superior division frontal branch filling. There is some contamination on the initial angiogram by the left-sided MCA branches given the significant right to left cross fill of the patent anterior communicating artery. Right ACA: A 1 segment patent. A 2 segment perfuses the  right territory. Significant cross fill to the left. Completion Findings: Right MCA: TICI 3: Complete perfusion of the territory Significant cross fill persists from the right to the left. Less than 50 percent stenosis at the cervical ICA origin. PROCEDURE: The anesthesia team was present to provide general endotracheal tube anesthesia and for patient monitoring during the procedure.  Intubation was performed in negative pressure [place] in neuro IR holding. Interventional neuro radiology nursing staff was also present. Ultrasound survey of the left inguinal region was performed with images stored and sent to PACs, confirming patency of the vessel. A micropuncture needle was used access the left common femoral artery under ultrasound. With excellent arterial blood flow returned, and an .018 micro wire was passed through the needle, observed enter the abdominal aorta under fluoroscopy. The needle was removed, and a micropuncture sheath was placed over the wire. The inner dilator and wire were removed, and an 035 Bentson wire was advanced under fluoroscopy into the abdominal aorta. The sheath was removed and a standard 4 [place] vascular sheath was placed. The dilator was removed and the sheath was flushed. Ultrasound survey of the right inguinal region was performed with images stored and sent to PACs. Patency of the right common femoral artery confirmed. 11 blade scalpel was used to make a small incision. Blunt dissection was performed with US guidance. A micropuncture needle was used access the right common femoral artery under ultrasound. With excellent arterial blood flow returned, an .018 micro wire was passed through the needle, observed to enter the abdominal aorta under fluoroscopy. The needle was removed, and a micropuncture sheath was placed over the wire. The inner dilator and wire were removed, and an 035 wire was advanced under fluoroscopy into the abdominal aorta. The sheath was removed and a 25cm 5F straight  vascular sheath was placed. The dilator was removed and the sheath was flushed. Sheath was attached to pressurized and heparinized saline bag for constant forward flow. JB 1 catheter was advanced with a Glidewire to the aortic arch. The Glidewire was removed and a double flush was performed. JB 1 was then used to select the innominate artery and the right common carotid artery origin. Catheter was advanced to the distal common carotid artery and the wire was removed. Angiogram was performed. Once we assessed the significant stenosis in the proximal right ICA, we elected to proceed 1st with balloon angioplasty. Exchange length roadrunner was advanced into the external carotid artery branches and the JB 1 was removed. A coaxial system was then advanced over the 035 wire. This included a 95cm 087 "Walrus" balloon guide with coaxial 100cm JB 1 diagnostic catheter. This was advanced to the distal common carotid artery. Wire and catheter were then removed. Double flush of the balloon guide was performed. Angiogram was performed for roadmap technique. Using the roadmap technique, A synchro soft wire and rapid transit catheter were used to cross the irregular stenotic plaque of the right ICA. Catheter wire were advanced into the distal cervical ICA and the wire was removed. A floppy transcend exchange length wire was then placed through the catheter and the catheter was removed. We then proceeded with balloon angioplasty of the cervical ICA segment with a 5 mm x 30 mm Viatrac rapid exchange system. Ten atmospheric pressure was observed. As the balloon was slowly deflated, the balloon guide was advanced over the balloon and wire combination to the distal cervical segment. The balloon and wire were then removed from the system and double flush was performed at the balloon guide. Formal angiogram was performed. Road map function was used once the occluded vessel was identified. Copious back flush was performed and the balloon  catheter was attached to heparinized and pressurized saline bag for forward flow. A second coaxial system was then advanced through the balloon catheter, which included the selected intermediate catheter, microcatheter, and  microwire. In this scenario, the set up included a 132cm CAT-5 intermediate catheter, a Trevo Provue18 microcatheter, and 014 synchro soft wire. This system was advanced through the balloon guide catheter under the road-map function, with adequate back-flush at the rotating hemostatic valve at that back end of the balloon guide. Microcatheter and the intermediate catheter system were advanced through the terminal ICA and MCA to the level of the occlusion. The micro wire was then carefully advanced through the occluded segment. Microcatheter was then manipulated through the occluded segment and the wire was removed with saline drip at the hub. Intermediate catheter was advanced into the proximal M1 segment. Blood was then aspirated through the hub of the microcatheter, and a gentle contrast injection was performed confirming intraluminal position. A rotating hemostatic valve was then attached to the back end of the microcatheter, and a pressurized and heparinized saline bag was attached to the catheter. 4 x 40 solitaire device was then selected. Back flush was achieved at the rotating hemostatic valve, and then the device was gently advanced through the microcatheter to the distal end. The retriever was then unsheathed by withdrawing the microcatheter under fluoroscopy. Once the retriever was completely unsheathed, the microcatheter was carefully stripped from the delivery device. Control angiogram was performed from the intermediate catheter. A 3 minute time interval was observed. The intermediate catheter was then attached to the proprietary suction engine and the catheter was slowly advanced over the solitaire until we observed stasis of flow within the intermediate catheter. The balloon at the  balloon guide catheter was then inflated under fluoroscopy for proximal flow arrest. Constant aspiration using the proprietary engine was then performed at the intermediate catheter, as the retriever was gently and slowly withdrawn with fluoroscopic observation. Once the retriever was "corked" within the tip of the intermediate catheter, both were removed from the system. Free aspiration was confirmed at the hub of the balloon guide catheter, with free blood return confirmed. The balloon was then deflated, and a control angiogram was performed. Restoration of flow was confirmed. Balloon guide was withdrawn to the common carotid artery segment after placing the transcend wire to the distal cervical segment, to maintain wire cross the ICA origin. Angiogram of the cervical ICA was performed. Given the improved diameter on the conclusion, we elected not to deployed stent at this time, as that would require dual anti-platelet therapy. Balloon guide and wire were then removed. The skin at the puncture site was then cleaned with Chlorhexidine. The 8 French sheath was removed and an 44F angioseal was deployed. Flat panel CT was performed. Patient tolerated the procedure well and remained hemodynamically stable throughout. No complications were encountered and no significant blood loss encountered. IMPRESSION: Status post ultrasound guided access right common femoral artery for right-sided cervical and cerebral angiogram, balloon angioplasty of high-grade right ICA stenosis and mechanical thrombectomy of right M1 occlusion, achieving TICI 3 flow. Angio-Seal for hemostasis. Status post ultrasound-guided access left common femoral artery for placement of arterial monitoring line for anesthesia/critical care. Signed, Dulcy Fanny. Dellia Nims, RPVI Vascular and Interventional Radiology Specialists [facility] Radiology PLAN: The patient will remain intubated, given the critical care status ICU status Target systolic blood pressure of  120-140 Right hip straight time 6 hours Left common femoral artery monitoring line. Frequent neurovascular checks Repeat neurologic imaging with CT and/MRI at the discretion of neurology team Electronically Signed   By: Corrie Mckusick D.O.   On: 11/04/2020 08:46   IR US Guide Vasc Access Right  Result  Date: 11/04/2020 INDICATION: 65 year old male presents with acute right M1 occlusion, emergent large vessel occlusion, for mechanical thrombectomy EXAM: ULTRASOUND-GUIDED ACCESS RIGHT COMMON FEMORAL ARTERY ULTRASOUND-GUIDED ACCESS LEFT COMMON FEMORAL ARTERY RIGHT CERVICAL AND CEREBRAL ANGIOGRAM BALLOON ANGIOPLASTY CRITICAL STENOSIS RIGHT ICA RIGHT M1 MECHANICAL THROMBECTOMY ANGIO-SEAL FOR HEMOSTASIS. COMPARISON:  CT IMAGING SAME DAY MEDICATIONS: Preoperative T NK ANESTHESIA/SEDATION: The anesthesia team was present to provide general endotracheal tube anesthesia and for patient monitoring during the procedure. Intubation was performed in biplane/neuro interventional suite. Left radial arterial line was performed by the anesthesia team. Interventional neuro radiology nursing staff was also present. CONTRAST:  70 cc omni 350 FLUOROSCOPY TIME:  Fluoroscopy Time: 17 minutes 48 seconds (1029 mGy). COMPLICATIONS: None TECHNIQUE: Informed written consent was obtained from the patient's family after a thorough discussion of the procedural risks, benefits and alternatives. Specific risks discussed include: Bleeding, infection, contrast reaction, kidney injury/failure, need for further procedure/surgery, arterial injury or dissection, embolization to new territory, intracranial hemorrhage (10-15% risk), neurologic deterioration, cardiopulmonary collapse, death. All questions were addressed. Maximal Sterile Barrier Technique was utilized including during the procedure including caps, mask, sterile gowns, sterile gloves, sterile drape, hand hygiene and skin antiseptic. A timeout was performed prior to the initiation of the  procedure. The anesthesia team was present to provide general endotracheal tube anesthesia and for patient monitoring during the procedure. Interventional neuro radiology nursing staff was also present. FINDINGS: Initial Findings: Right common carotid artery:  Normal course caliber and contour. Right external carotid artery: Patent with antegrade flow. Atherosclerotic plaque extends into the proximal ECA with estimated 50 percent narrowing at the origin. Right internal carotid artery: Advanced atherosclerotic changes at the right carotid bifurcation, with at least 50 percent circumferential calcified plaque in the proximal right ICA, and extension of plaque into the proximal right external carotid artery. By NASCET criteria, the initial stenosis measures at least 81 percent . After balloon angioplasty, stenosis measures less than 50 percent. Right MCA: Since the prior CT, there has been migration of the thrombus into the inferior division, with a superior division frontal branch filling. There is some contamination on the initial angiogram by the left-sided MCA branches given the significant right to left cross fill of the patent anterior communicating artery. Right ACA: A 1 segment patent. A 2 segment perfuses the right territory. Significant cross fill to the left. Completion Findings: Right MCA: TICI 3: Complete perfusion of the territory Significant cross fill persists from the right to the left. Less than 50 percent stenosis at the cervical ICA origin. PROCEDURE: The anesthesia team was present to provide general endotracheal tube anesthesia and for patient monitoring during the procedure. Intubation was performed in negative pressure [place] in neuro IR holding. Interventional neuro radiology nursing staff was also present. Ultrasound survey of the left inguinal region was performed with images stored and sent to PACs, confirming patency of the vessel. A micropuncture needle was used access the left common femoral  artery under ultrasound. With excellent arterial blood flow returned, and an .018 micro wire was passed through the needle, observed enter the abdominal aorta under fluoroscopy. The needle was removed, and a micropuncture sheath was placed over the wire. The inner dilator and wire were removed, and an 035 Bentson wire was advanced under fluoroscopy into the abdominal aorta. The sheath was removed and a standard 4 [place] vascular sheath was placed. The dilator was removed and the sheath was flushed. Ultrasound survey of the right inguinal region was performed with images stored and sent to  PACs. Patency of the right common femoral artery confirmed. 11 blade scalpel was used to make a small incision. Blunt dissection was performed with US guidance. A micropuncture needle was used access the right common femoral artery under ultrasound. With excellent arterial blood flow returned, an .018 micro wire was passed through the needle, observed to enter the abdominal aorta under fluoroscopy. The needle was removed, and a micropuncture sheath was placed over the wire. The inner dilator and wire were removed, and an 035 wire was advanced under fluoroscopy into the abdominal aorta. The sheath was removed and a 25cm 72F straight vascular sheath was placed. The dilator was removed and the sheath was flushed. Sheath was attached to pressurized and heparinized saline bag for constant forward flow. JB 1 catheter was advanced with a Glidewire to the aortic arch. The Glidewire was removed and a double flush was performed. JB 1 was then used to select the innominate artery and the right common carotid artery origin. Catheter was advanced to the distal common carotid artery and the wire was removed. Angiogram was performed. Once we assessed the significant stenosis in the proximal right ICA, we elected to proceed 1st with balloon angioplasty. Exchange length roadrunner was advanced into the external carotid artery branches and the JB 1  was removed. A coaxial system was then advanced over the 035 wire. This included a 95cm 087 "[place]" balloon guide with coaxial 100cm JB 1 diagnostic catheter. This was advanced to the distal common carotid artery. Wire and catheter were then removed. Double flush of the balloon guide was performed. Angiogram was performed for roadmap technique. Using the roadmap technique, A synchro soft wire and rapid transit catheter were used to cross the irregular stenotic plaque of the right ICA. Catheter wire were advanced into the distal cervical ICA and the wire was removed. A floppy transcend exchange length wire was then placed through the catheter and the catheter was removed. We then proceeded with balloon angioplasty of the cervical ICA segment with a 5 mm x 30 mm Viatrac rapid exchange system. Ten atmospheric pressure was observed. As the balloon was slowly deflated, the balloon guide was advanced over the balloon and wire combination to the distal cervical segment. The balloon and wire were then removed from the system and double flush was performed at the balloon guide. Formal angiogram was performed. Road map function was used once the occluded vessel was identified. Copious back flush was performed and the balloon catheter was attached to heparinized and pressurized saline bag for forward flow. A second coaxial system was then advanced through the balloon catheter, which included the selected intermediate catheter, microcatheter, and microwire. In this scenario, the set up included a 132cm CAT-5 intermediate catheter, a Trevo Provue18 microcatheter, and 014 synchro soft wire. This system was advanced through the balloon guide catheter under the road-map function, with adequate back-flush at the rotating hemostatic valve at that back end of the balloon guide. Microcatheter and the intermediate catheter system were advanced through the terminal ICA and MCA to the level of the occlusion. The micro wire was then  carefully advanced through the occluded segment. Microcatheter was then manipulated through the occluded segment and the wire was removed with saline drip at the hub. Intermediate catheter was advanced into the proximal M1 segment. Blood was then aspirated through the hub of the microcatheter, and a gentle contrast injection was performed confirming intraluminal position. A rotating hemostatic valve was then attached to the back end of the microcatheter, and a  pressurized and heparinized saline bag was attached to the catheter. 4 x 40 solitaire device was then selected. Back flush was achieved at the rotating hemostatic valve, and then the device was gently advanced through the microcatheter to the distal end. The retriever was then unsheathed by withdrawing the microcatheter under fluoroscopy. Once the retriever was completely unsheathed, the microcatheter was carefully stripped from the delivery device. Control angiogram was performed from the intermediate catheter. A 3 minute time interval was observed. The intermediate catheter was then attached to the proprietary suction engine and the catheter was slowly advanced over the solitaire until we observed stasis of flow within the intermediate catheter. The balloon at the balloon guide catheter was then inflated under fluoroscopy for proximal flow arrest. Constant aspiration using the proprietary engine was then performed at the intermediate catheter, as the retriever was gently and slowly withdrawn with fluoroscopic observation. Once the retriever was "corked" within the tip of the intermediate catheter, both were removed from the system. Free aspiration was confirmed at the hub of the balloon guide catheter, with free blood return confirmed. The balloon was then deflated, and a control angiogram was performed. Restoration of flow was confirmed. Balloon guide was withdrawn to the common carotid artery segment after placing the transcend wire to the distal cervical  segment, to maintain wire cross the ICA origin. Angiogram of the cervical ICA was performed. Given the improved diameter on the conclusion, we elected not to deployed stent at this time, as that would require dual anti-platelet therapy. Balloon guide and wire were then removed. The skin at the puncture site was then cleaned with Chlorhexidine. The 8 French sheath was removed and an 71F angioseal was deployed. Flat panel CT was performed. Patient tolerated the procedure well and remained hemodynamically stable throughout. No complications were encountered and no significant blood loss encountered. IMPRESSION: Status post ultrasound guided access right common femoral artery for right-sided cervical and cerebral angiogram, balloon angioplasty of high-grade right ICA stenosis and mechanical thrombectomy of right M1 occlusion, achieving TICI 3 flow. Angio-Seal for hemostasis. Status post ultrasound-guided access left common femoral artery for placement of arterial monitoring line for anesthesia/critical care. Signed, Dulcy Fanny. Dellia Nims, RPVI [facility] Radiology PLAN: The patient will remain intubated, given the critical care status ICU status Target systolic blood pressure of 120-140 Right hip straight time 6 hours Left common femoral artery monitoring line. Frequent neurovascular checks Repeat neurologic imaging with CT and/MRI at the discretion of neurology team Electronically Signed   By: Corrie Mckusick D.O.   On: 11/04/2020 08:46   Portable Chest x-ray  Result Date: 11/03/2020 CLINICAL DATA:  Status post endotracheal tube and OG tube placement. EXAM: PORTABLE CHEST 1 VIEW COMPARISON:  Chest x-ray dated 12/02/2015. FINDINGS: Endotracheal tube appears well positioned with tip above the level of the carina. Enteric tube passes below the diaphragm. Mild central pulmonary vascular congestion. Probable mild atelectasis at the medial aspect of the RIGHT lung base.  Coarse interstitial markings throughout the remainder of both lungs. No pleural effusion or pneumothorax is seen. IMPRESSION: 1. Endotracheal and enteric tubes appear well positioned. 2. Central pulmonary vascular congestion and coarse interstitial markings throughout both lungs, most likely interstitial edema superimposed on chronic interstitial lung disease. 3. Probable atelectasis at the RIGHT lung base. Electronically Signed   By: Franki Cabot M.D.   On: 11/03/2020 09:53   ECHOCARDIOGRAM COMPLETE  Result Date: 11/03/2020    ECHOCARDIOGRAM REPORT   Patient Name:  [name] Date of Exam: 11/03/2020 Medical Rec #:  [id number]       Height:       72.0 in Accession #:    [id number]      Weight:       193.1 lb Date of Birth:  Sep 09, 1955       BSA:          2.099 m Patient Age:    3 years        BP:           141/86 mmHg Patient Gender: M               HR:           74 bpm. Exam Location:  Inpatient Procedure: 2D Echo, Cardiac Doppler, Color Doppler and Intracardiac            Opacification Agent Indications:    Acute myocardial infarction  History:        Patient has prior history of Echocardiogram examinations, most                 recent 06/07/2019. CHF, COPD, Arrythmias:Atrial Fibrillation;                 Risk Factors:Current Smoker, Dyslipidemia, Hypertension and                 Sleep Apnea.  Sonographer:    Clayton Lefort RDCS (AE) Referring Phys: [id number] Cook  1. The mid to distal septum/anterior segments are akinetic. The entire apex is akinetic. The myocardium appears thin. Suspect these findings represent a large wrap around LAD infarction (troponin >24,000). No LV thrombus on contrast imaging. There is severe LV dysfunction, EF 20-25%. Left ventricular ejection fraction, by estimation, is 20 to 25%. The left ventricle has severely decreased function. The left ventricle demonstrates regional wall motion abnormalities (see scoring diagram/findings for description). There is mild  left ventricular hypertrophy. Left ventricular diastolic parameters are consistent with Grade I diastolic dysfunction (impaired relaxation).  2. Right ventricular systolic function is normal. The right ventricular size is normal. Tricuspid regurgitation signal is inadequate for assessing PA pressure.  3. The mitral valve is grossly normal. Trivial mitral valve regurgitation. No evidence of mitral stenosis.  4. The aortic valve is tricuspid. There is moderate calcification of the aortic valve. Aortic valve regurgitation is not visualized. Mild to moderate aortic valve sclerosis/calcification is present, without any evidence of aortic stenosis.  5. The inferior vena cava is normal in size with <50% respiratory variability, suggesting right atrial pressure of 8 mmHg. Comparison(s): Changes from prior study are noted. LVEF now severely reduced 20-25%. WMA suggest LAD infarction. Conclusion(s)/Recommendation(s): Findings consistent with ischemic cardiomyopathy. FINDINGS  Left Ventricle: The mid to distal septum/anterior segments are akinetic. The entire apex is akinetic. The myocardium appears thin. Suspect these findings represent a large wrap around LAD infarction (troponin >24,000). No LV thrombus on contrast imaging. There is severe LV dysfunction, EF 20-25%. Left ventricular ejection fraction, by estimation, is 20 to 25%. The left ventricle has severely decreased function. The left ventricle demonstrates regional wall motion abnormalities. Definity contrast  agent was given IV to delineate the left ventricular endocardial borders. The left ventricular internal cavity size was normal in size. There is mild left ventricular hypertrophy. Left ventricular diastolic parameters are consistent with Grade I diastolic dysfunction (impaired relaxation).  LV Wall Scoring: The mid and distal anterior wall, mid and distal anterior septum, entire apex, and mid inferoseptal  segment are akinetic. Right Ventricle: The right  ventricular size is normal. No increase in right ventricular wall thickness. Right ventricular systolic function is normal. Tricuspid regurgitation signal is inadequate for assessing PA pressure. Left Atrium: Left atrial size was normal in size. Right Atrium: Right atrial size was normal in size. Pericardium: There is no evidence of pericardial effusion. Mitral Valve: The mitral valve is grossly normal. Mild mitral annular calcification. Trivial mitral valve regurgitation. No evidence of mitral valve stenosis. MV peak gradient, 5.6 mmHg. The mean mitral valve gradient is 3.0 mmHg. Tricuspid Valve: The tricuspid valve is grossly normal. Tricuspid valve regurgitation is trivial. No evidence of tricuspid stenosis. Aortic Valve: The aortic valve is tricuspid. There is moderate calcification of the aortic valve. Aortic valve regurgitation is not visualized. Mild to moderate aortic valve sclerosis/calcification is present, without any evidence of aortic stenosis. Aortic valve mean gradient measures 4.0 mmHg. Aortic valve peak gradient measures 7.6 mmHg. Aortic valve area, by VTI measures 1.80 cm. Pulmonic Valve: The pulmonic valve was grossly normal. Pulmonic valve regurgitation is trivial. No evidence of pulmonic stenosis. Aorta: The aortic root is normal in size and structure. Venous: The inferior vena cava is normal in size with less than 50% respiratory variability, suggesting right atrial pressure of 8 mmHg. IAS/Shunts: The atrial septum is grossly normal.  LEFT VENTRICLE PLAX 2D LVIDd:         4.40 cm   Diastology LVIDs:         3.10 cm   LV e' medial:    6.53 cm/s LV PW:         1.20 cm   LV E/e' medial:  12.5 LV IVS:        1.20 cm   LV e' lateral:   6.75 cm/s LVOT diam:     2.00 cm   LV E/e' lateral: 12.1 LV SV:         54 LV SV Index:   26 LVOT Area:     3.14 cm                           3D Volume EF:                          3D EF:        42 %                          LV EDV:       154 ml                           LV ESV:       89 ml                          LV SV:        64 ml RIGHT VENTRICLE             IVC RV Basal diam:  2.90 cm     IVC diam: 1.90 cm RV S prime:     16.10 cm/s TAPSE (M-mode): 2.3 cm LEFT ATRIUM           Index        RIGHT ATRIUM           Index LA diam:      3.30  cm 1.57 cm/m   RA Area:     19.20 cm LA Vol (A4C): 45.0 ml 21.44 ml/m  RA Volume:   56.10 ml  26.73 ml/m  AORTIC VALVE AV Area (Vmax):    2.05 cm AV Area (Vmean):   1.83 cm AV Area (VTI):     1.80 cm AV Vmax:           138.00 cm/s AV Vmean:          98.600 cm/s AV VTI:            0.298 m AV Peak Grad:      7.6 mmHg AV Mean Grad:      4.0 mmHg LVOT Vmax:         90.10 cm/s LVOT Vmean:        57.500 cm/s LVOT VTI:          0.171 m LVOT/AV VTI ratio: 0.57  AORTA Ao Root diam: 3.50 cm Ao Asc diam:  2.90 cm MITRAL VALVE MV Area (PHT): 3.66 cm     SHUNTS MV Area VTI:   1.70 cm     Systemic VTI:  0.17 m MV Peak grad:  5.6 mmHg     Systemic Diam: 2.00 cm MV Mean grad:  3.0 mmHg MV Vmax:       1.18 m/s MV Vmean:      75.5 cm/s MV Decel Time: 207 msec MV E velocity: 81.50 cm/s MV A velocity: 128.00 cm/s MV E/A ratio:  0.64 Eleonore Chiquito MD Electronically signed by Eleonore Chiquito MD Signature Date/Time: 11/03/2020/4:49:02 PM    Final    IR PERCUTANEOUS ART THROMBECTOMY/INFUSION INTRACRANIAL INC DIAG ANGIO  Result Date: 11/04/2020 INDICATION: 65 year old male presents with acute right M1 occlusion, emergent large vessel occlusion, for mechanical thrombectomy EXAM: ULTRASOUND-GUIDED ACCESS RIGHT COMMON FEMORAL ARTERY ULTRASOUND-GUIDED ACCESS LEFT COMMON FEMORAL ARTERY RIGHT CERVICAL AND CEREBRAL ANGIOGRAM BALLOON ANGIOPLASTY CRITICAL STENOSIS RIGHT ICA RIGHT M1 MECHANICAL THROMBECTOMY ANGIO-SEAL FOR HEMOSTASIS. COMPARISON:  CT IMAGING SAME DAY MEDICATIONS: Preoperative T NK ANESTHESIA/SEDATION: The anesthesia team was present to provide general endotracheal tube anesthesia and for patient monitoring during the procedure. Intubation was  performed in biplane/neuro interventional suite. Left radial arterial line was performed by the anesthesia team. Interventional neuro radiology nursing staff was also present. CONTRAST:  70 cc omni 350 FLUOROSCOPY TIME:  Fluoroscopy Time: 17 minutes 48 seconds (1029 mGy). COMPLICATIONS: None TECHNIQUE: Informed written consent was obtained from the patient's family after a thorough discussion of the procedural risks, benefits and alternatives. Specific risks discussed include: Bleeding, infection, contrast reaction, kidney injury/failure, need for further procedure/surgery, arterial injury or dissection, embolization to new territory, intracranial hemorrhage (10-15% risk), neurologic deterioration, cardiopulmonary collapse, death. All questions were addressed. Maximal Sterile Barrier Technique was utilized including during the procedure including caps, mask, sterile gowns, sterile gloves, sterile drape, hand hygiene and skin antiseptic. A timeout was performed prior to the initiation of the procedure. The anesthesia team was present to provide general endotracheal tube anesthesia and for patient monitoring during the procedure. Interventional neuro radiology nursing staff was also present. FINDINGS: Initial Findings: Right common carotid artery:  Normal course caliber and contour. Right external carotid artery: Patent with antegrade flow. Atherosclerotic plaque extends into the proximal ECA with estimated 50 percent narrowing at the origin. Right internal carotid artery: Advanced atherosclerotic changes at the right carotid bifurcation, with at least 50 percent circumferential calcified plaque in the proximal right ICA, and extension of plaque into the proximal right external carotid  artery. By NASCET criteria, the initial stenosis measures at least 81 percent . After balloon angioplasty, stenosis measures less than 50 percent. Right MCA: Since the prior CT, there has been migration of the thrombus into the inferior  division, with a superior division frontal branch filling. There is some contamination on the initial angiogram by the left-sided MCA branches given the significant right to left cross fill of the patent anterior communicating artery. Right ACA: A 1 segment patent. A 2 segment perfuses the right territory. Significant cross fill to the left. Completion Findings: Right MCA: TICI 3: Complete perfusion of the territory Significant cross fill persists from the right to the left. Less than 50 percent stenosis at the cervical ICA origin. PROCEDURE: The anesthesia team was present to provide general endotracheal tube anesthesia and for patient monitoring during the procedure. Intubation was performed in negative pressure [place] in neuro IR holding. Interventional neuro radiology nursing staff was also present. Ultrasound survey of the left inguinal region was performed with images stored and sent to PACs, confirming patency of the vessel. A micropuncture needle was used access the left common femoral artery under ultrasound. With excellent arterial blood flow returned, and an .018 micro wire was passed through the needle, observed enter the abdominal aorta under fluoroscopy. The needle was removed, and a micropuncture sheath was placed over the wire. The inner dilator and wire were removed, and an 035 Bentson wire was advanced under fluoroscopy into the abdominal aorta. The sheath was removed and a standard 4 [place] vascular sheath was placed. The dilator was removed and the sheath was flushed. Ultrasound survey of the right inguinal region was performed with images stored and sent to PACs. Patency of the right common femoral artery confirmed. 11 blade scalpel was used to make a small incision. Blunt dissection was performed with US guidance. A micropuncture needle was used access the right common femoral artery under ultrasound. With excellent arterial blood flow returned, an .018 micro wire was passed through the needle,  observed to enter the abdominal aorta under fluoroscopy. The needle was removed, and a micropuncture sheath was placed over the wire. The inner dilator and wire were removed, and an 035 wire was advanced under fluoroscopy into the abdominal aorta. The sheath was removed and a 25cm 43F straight vascular sheath was placed. The dilator was removed and the sheath was flushed. Sheath was attached to pressurized and heparinized saline bag for constant forward flow. JB 1 catheter was advanced with a Glidewire to the aortic arch. The Glidewire was removed and a double flush was performed. JB 1 was then used to select the innominate artery and the right common carotid artery origin. Catheter was advanced to the distal common carotid artery and the wire was removed. Angiogram was performed. Once we assessed the significant stenosis in the proximal right ICA, we elected to proceed 1st with balloon angioplasty. Exchange length roadrunner was advanced into the external carotid artery branches and the JB 1 was removed. A coaxial system was then advanced over the 035 wire. This included a 95cm 087 "[place]rus" balloon guide with coaxial 100cm JB 1 diagnostic catheter. This was advanced to the distal common carotid artery. Wire and catheter were then removed. Double flush of the balloon guide was performed. Angiogram was performed for roadmap technique. Using the roadmap technique, A synchro soft wire and rapid transit catheter were used to cross the irregular stenotic plaque of the right ICA. Catheter wire were advanced into the distal cervical ICA and the  wire was removed. A floppy transcend exchange length wire was then placed through the catheter and the catheter was removed. We then proceeded with balloon angioplasty of the cervical ICA segment with a 5 mm x 30 mm Viatrac rapid exchange system. Ten atmospheric pressure was observed. As the balloon was slowly deflated, the balloon guide was advanced over the balloon and wire  combination to the distal cervical segment. The balloon and wire were then removed from the system and double flush was performed at the balloon guide. Formal angiogram was performed. Road map function was used once the occluded vessel was identified. Copious back flush was performed and the balloon catheter was attached to heparinized and pressurized saline bag for forward flow. A second coaxial system was then advanced through the balloon catheter, which included the selected intermediate catheter, microcatheter, and microwire. In this scenario, the set up included a 132cm CAT-5 intermediate catheter, a Trevo Provue18 microcatheter, and 014 synchro soft wire. This system was advanced through the balloon guide catheter under the road-map function, with adequate back-flush at the rotating hemostatic valve at that back end of the balloon guide. Microcatheter and the intermediate catheter system were advanced through the terminal ICA and MCA to the level of the occlusion. The micro wire was then carefully advanced through the occluded segment. Microcatheter was then manipulated through the occluded segment and the wire was removed with saline drip at the hub. Intermediate catheter was advanced into the proximal M1 segment. Blood was then aspirated through the hub of the microcatheter, and a gentle contrast injection was performed confirming intraluminal position. A rotating hemostatic valve was then attached to the back end of the microcatheter, and a pressurized and heparinized saline bag was attached to the catheter. 4 x 40 solitaire device was then selected. Back flush was achieved at the rotating hemostatic valve, and then the device was gently advanced through the microcatheter to the distal end. The retriever was then unsheathed by withdrawing the microcatheter under fluoroscopy. Once the retriever was completely unsheathed, the microcatheter was carefully stripped from the delivery device. Control angiogram was  performed from the intermediate catheter. A 3 minute time interval was observed. The intermediate catheter was then attached to the proprietary suction engine and the catheter was slowly advanced over the solitaire until we observed stasis of flow within the intermediate catheter. The balloon at the balloon guide catheter was then inflated under fluoroscopy for proximal flow arrest. Constant aspiration using the proprietary engine was then performed at the intermediate catheter, as the retriever was gently and slowly withdrawn with fluoroscopic observation. Once the retriever was "corked" within the tip of the intermediate catheter, both were removed from the system. Free aspiration was confirmed at the hub of the balloon guide catheter, with free blood return confirmed. The balloon was then deflated, and a control angiogram was performed. Restoration of flow was confirmed. Balloon guide was withdrawn to the common carotid artery segment after placing the transcend wire to the distal cervical segment, to maintain wire cross the ICA origin. Angiogram of the cervical ICA was performed. Given the improved diameter on the conclusion, we elected not to deployed stent at this time, as that would require dual anti-platelet therapy. Balloon guide and wire were then removed. The skin at the puncture site was then cleaned with Chlorhexidine. The 8 French sheath was removed and an 67F angioseal was deployed. Flat panel CT was performed. Patient tolerated the procedure well and remained hemodynamically stable throughout. No complications were encountered and  no significant blood loss encountered. IMPRESSION: Status post ultrasound guided access right common femoral artery for right-sided cervical and cerebral angiogram, balloon angioplasty of high-grade right ICA stenosis and mechanical thrombectomy of right M1 occlusion, achieving TICI 3 flow. Angio-Seal for hemostasis. Status post ultrasound-guided access left common femoral  artery for placement of arterial monitoring line for anesthesia/critical care. Signed, Dulcy Fanny. Dellia Nims, RPVI [facility] Interventional Radiology Special[facility] Radiology PLAN: The patient will remain intubated, given the critical care status ICU status Target systolic blood pressure of 120-140 Right hip straight time 6 hours Left common femoral artery monitoring line. Frequent neurovascular checks Repeat neurologic imaging with CT and/MRI at the discretion of neurology team Electronically Signed   By: Corrie Mckusick D.O.   On: 11/04/2020 08:46   CT HEAD CODE STROKE WO CONTRAST  Result Date: 11/03/2020 CLINICAL DATA:  Code stroke. EXAM: CT HEAD WITHOUT CONTRAST TECHNIQUE: Contiguous axial images were obtained from the base of the skull through the vertex without intravenous contrast. COMPARISON:  11/01/2013 FINDINGS: Brain: On coronal reformats there is equivocal cortical indistinctness of the right frontal operculum. No hemorrhage or hydrocephalus. Negative for mass. Patchy remote cortically based infarcts at the left vertex. Vascular: Hyperdense right M1 segment Skull: Normal. Negative for fracture or focal lesion. Sinuses/Orbits: No acute finding. Other: Page to Dr. Lorrin Goodell at 6:24 am on 11/03/2020 by text page via the [facility] messaging system. ASPECTS [facility] Stroke Program Early CT Score) - Ganglionic level infarction (caudate, lentiform nuclei, internal capsule, insula, M1-M3 cortex): 6 or 7 - Supraganglionic infarction (M4-M6 cortex): 3 Total score (0-10 with 10 being normal): 9 or 10 IMPRESSION: 1. Dense right M1 segment suspicious for thrombosis. 2. Equivocal for acute infarct at the right insula/operculum. 3. No acute hemorrhage. 4. 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 Electronically Signed   By: Jorje Guild M.D.   On: 11/03/2020 06:27   CT ANGIO HEAD CODE STROKE  Result Date: 11/03/2020 CLINICAL DATA:  Assess intracranial arteries. EXAM: CT ANGIOGRAPHY HEAD AND  NECK TECHNIQUE: Multidetector CT imaging of the head and neck was performed using the standard protocol during bolus administration of intravenous contrast. Multiplanar CT image reconstructions and MIPs were obtained to evaluate the vascular anatomy. Carotid stenosis measurements (when applicable) are obtained utilizing NASCET criteria, using the distal internal carotid diameter as the denominator. CONTRAST:  179mL OMNIPAQUE IOHEXOL 350 MG/ML SOLN COMPARISON:  None similar FINDINGS: CTA NECK FINDINGS Aortic arch: Atheromatous plaque.  Three vessel branching. Right carotid system: Primarily calcific atherosclerosis with severe ICA origin stenosis to the degree that the lumen is not accurately measurable. No dissection. Left carotid system: Atherosclerosis with bulky mixed density plaque at the left ICA bulb with severe stenosis causing downstream faint underfilling Vertebral arteries: Proximal subclavian atherosclerosis with up to 60% left origin stenosis. Dominant right vertebral artery with atherosclerotic calcification but wide patency to the dura. Faint intermittent flow in the left vertebral artery. Skeleton: Ordinary cervical spine degeneration Other neck: No acute finding Upper chest: Paraseptal emphysema. Review of the MIP images confirms the above findings CTA HEAD FINDINGS Anterior circulation: Heavily calcified carotid siphons with at least moderate stenosis on the right. TOn the left there is quantification due to underfilling. Right M1 occlusion with faint downstream reconstitution. Major vessels on the left show better patency. Negative for aneurysm Posterior circulation: Vertebral and basilar arteries are widely patent. Fetal type bilateral PCA flow. No PCA branch occlusion. Venous sinuses: Unremarkable Anatomic variants: As above Review of the MIP images  confirms the above findings IMPRESSION: 1. Emergent large vessel occlusion at the right M1 segment. 2. Severe bilateral ICA origin stenosis with  underfilling left. 3. Thready intermittent flow in the non dominant left vertebral artery. 4. 60% left subclavian origin stenosis. Electronically Signed   By: Jorje Guild M.D.   On: 11/03/2020 06:41   CT ANGIO NECK CODE STROKE  Result Date: 11/03/2020 CLINICAL DATA:  Assess intracranial arteries. EXAM: CT ANGIOGRAPHY HEAD AND NECK TECHNIQUE: Multidetector CT imaging of the head and neck was performed using the standard protocol during bolus administration of intravenous contrast. Multiplanar CT image reconstructions and MIPs were obtained to evaluate the vascular anatomy. Carotid stenosis measurements (when applicable) are obtained utilizing NASCET criteria, using the distal internal carotid diameter as the denominator. CONTRAST:  137mL OMNIPAQUE IOHEXOL 350 MG/ML SOLN COMPARISON:  None similar FINDINGS: CTA NECK FINDINGS Aortic arch: Atheromatous plaque.  Three vessel branching. Right carotid system: Primarily calcific atherosclerosis with severe ICA origin stenosis to the degree that the lumen is not accurately measurable. No dissection. Left carotid system: Atherosclerosis with bulky mixed density plaque at the left ICA bulb with severe stenosis causing downstream faint underfilling Vertebral arteries: Proximal subclavian atherosclerosis with up to 60% left origin stenosis. Dominant right vertebral artery with atherosclerotic calcification but wide patency to the dura. Faint intermittent flow in the left vertebral artery. Skeleton: Ordinary cervical spine degeneration Other neck: No acute finding Upper chest: Paraseptal emphysema. Review of the MIP images confirms the above findings CTA HEAD FINDINGS Anterior circulation: Heavily calcified carotid siphons with at least moderate stenosis on the right. TOn the left there is quantification due to underfilling. Right M1 occlusion with faint downstream reconstitution. Major vessels on the left show better patency. Negative for aneurysm Posterior circulation:  Vertebral and basilar arteries are widely patent. Fetal type bilateral PCA flow. No PCA branch occlusion. Venous sinuses: Unremarkable Anatomic variants: As above Review of the MIP images confirms the above findings IMPRESSION: 1. Emergent large vessel occlusion at the right M1 segment. 2. Severe bilateral ICA origin stenosis with underfilling left. 3. Thready intermittent flow in the non dominant left vertebral artery. 4. 60% left subclavian origin stenosis. Electronically Signed   By: Jorje Guild M.D.   On: 11/03/2020 06:41    Labs:  CBC: Recent Labs    11/03/20 0603 11/03/20 0622 11/03/20 1016 11/04/20 0614  WBC 15.1*  --   --  12.3*  HGB 16.8 17.3* 16.3 13.6  HCT 47.1 51.0 48.0 39.3  PLT 210  --   --  145*    COAGS: Recent Labs    11/03/20 0603  INR 1.0  APTT 39*    BMP: Recent Labs    11/03/20 0603 11/03/20 0622 11/03/20 1016 11/04/20 0614  NA 133* 136 136 135  K 3.8 3.8 4.3 3.8  CL 98 98  --  105  CO2 25  --   --  22  GLUCOSE 194* 201*  --  121*  BUN 11 13  --  11  CALCIUM 10.2  --   --  9.5  CREATININE 0.93 0.70  --  0.89  GFRNONAA >60  --   --  >60    LIVER FUNCTION TESTS: Recent Labs    11/03/20 0603  BILITOT 1.8*  AST 83*  ALT 30  ALKPHOS 131*  PROT 7.5  ALBUMIN 4.0    Assessment and Plan:  65 y/o M who presented to [facility] on 10/16 as a code stroke and underwent right ICA  angioplasty without stent placement and right distal M1 thrombectomy in NIR seen today for follow up.  Patient extubated yesterday, grossly intact neurologically on exam today. Right CFA puncture site clean, dressed, soft, non tender. Left CFA with art line in place and good waveform.  Per cardiology recommendation patient to start DAPT - Plavix + ASA 81 (was on Pletal + ASA 81 prior to admission).  Further plans per neurology/primary team -- NIR remains available as needed, please call with questions or concerns.  Electronically Signed: Joaquim Nam,  PA-C 11/04/2020, 11:06 AM   I spent a total of 25 Minutes at the the patient's bedside AND on the patient's hospital floor or unit, greater than 50% of which was counseling/coordinating care for follow up code stroke.

## 2020-11-04 NOTE — Progress Notes (Signed)
ANTICOAGULATION CONSULT NOTE  Pharmacy Consult for heparin Indication: chest pain/ACS  No Known Allergies  Patient Measurements: Height: 6' (182.9 cm) Weight: 87.6 kg (193 lb 2 oz) IBW/kg (Calculated) : 77.6 Heparin Dosing Weight: 87.6  Vital Signs: Temp: 98.5 F (36.9 C) (10/17 2000) Temp Source: Oral (10/17 2000) BP: 146/79 (10/17 2200) Pulse Rate: 81 (10/17 2200)  Labs: Recent Labs    11/03/20 0603 11/03/20 0622 11/03/20 1016 11/03/20 1423 11/03/20 2227 11/04/20 0614 11/04/20 2217  HGB 16.8 17.3* 16.3  --   --  13.6  --   HCT 47.1 51.0 48.0  --   --  39.3  --   PLT 210  --   --   --   --  145*  --   APTT 39*  --   --   --   --   --   --   LABPROT 13.6  --   --   --   --   --   --   INR 1.0  --   --   --   --   --   --   HEPARINUNFRC  --   --   --   --   --   --  0.36  CREATININE 0.93 0.70  --   --   --  0.89  --   TROPONINIHS  --   --   --  >24,000* >24,000*  --   --      Estimated Creatinine Clearance: 88.4 mL/min (by C-G formula based on SCr of 0.89 mg/dL).   Assessment: 51 YOM who presented with a R MCA stroke who was found to have a STEMI. Pharmacy consulted to start IV heparin for ACS/STEMI. H/H wnl. Plt low. SCr wnl.  Heparin level therapeutic (0.36) on gtt at 1150 units/hr. No bleeding noted.  Goal of Therapy:  Heparin level 0.3-0.5 units/ml Monitor platelets by anticoagulation protocol: Yes   Plan:  Continue heparin 1150 units/hr F/u daily heparin level and CBC  Sherlon Handing, PharmD, BCPS Please see amion for complete clinical pharmacist phone list 11/04/2020 11:02 PM

## 2020-11-04 NOTE — Progress Notes (Signed)
OT Cancellation Note  Patient Details Name: Christopher Lang MRN: [id number] DOB: [birth year]   Cancelled Treatment:    Reason Eval/Treat Not Completed: Active bedrest order;Medical issues which prohibited therapy  [place], OT/L   Acute OT Clinical Specialist [place] Pager [phone] Office [phone]  11/04/2020, 3:16 PM

## 2020-11-04 NOTE — Progress Notes (Signed)
EKG CRITICAL VALUE     12 lead EKG performed.  Critical value noted.  Isabelle Course, RN notified.   Warren Lacy, CCT 11/04/2020 11:12 AM

## 2020-11-04 NOTE — Progress Notes (Signed)
Left groin 58fr arterial sheath disconnected from pressure bag, aspirated and flushed. Suture cut and sheath pulled with quikclot pad applied at 1253.  Pressure held and complete hemostasis achieved at 1304.  Tegaderm and gauze applied to groin with no bleeding or hematoma present. Showed groin site to RN Madalyn Rob and advised to remove quikclot pad at 24 hour mark. Advised RN and patient to remain flat and keep leg straight starting for 4 hours. [place],  RTR

## 2020-11-04 NOTE — Progress Notes (Addendum)
Progress Note  Patient Name: Christopher Lang Date of Encounter: 11/04/2020  [facility] Cardiologist: Rozann Lesches, MD   Subjective   Alert and oriented. No complaints. Family at the bedside.   Inpatient Medications    Scheduled Meds:  atorvastatin  10 mg Oral q1800   chlorhexidine  15 mL Mouth Rinse BID   Chlorhexidine Gluconate Cloth  6 each Topical Daily   cholecalciferol  1,000 Units Oral Daily   insulin aspart  0-15 Units Subcutaneous Q4H   labetalol  20 mg Intravenous Once   mouth rinse  15 mL Mouth Rinse q12n4p   metoprolol tartrate  25 mg Oral BID   pantoprazole (PROTONIX) IV  40 mg Intravenous QHS   polyethylene glycol  17 g Per Tube Daily   sodium chloride flush  3 mL Intravenous Once   Continuous Infusions:  sodium chloride 75 mL/hr at 11/04/20 0700   clevidipine 4 mg/hr (11/04/20 0700)   PRN Meds: acetaminophen **OR** acetaminophen (TYLENOL) oral liquid 160 mg/5 mL **OR** acetaminophen, senna-docusate   Vital Signs    Vitals:   11/04/20 0645 11/04/20 0700 11/04/20 0715 11/04/20 0800  BP: 133/72 (!) 141/77 118/61   Pulse: (!) 101 70 67   Resp: 17 16 16    Temp:    98.5 F (36.9 C)  TempSrc:    Axillary  SpO2: (!) 89% 93% 93%   Weight:      Height:        Intake/Output Summary (Last 24 hours) at 11/04/2020 0833 Last data filed at 11/04/2020 0700 Gross per 24 hour  Intake 2402.04 ml  Output 750 ml  Net 1652.04 ml   Last 3 Weights 11/03/2020 12/12/2019 08/15/2019  Weight (lbs) 193 lb 2 oz 207 lb 199 lb  Weight (kg) 87.6 kg 93.895 kg 90.266 kg      Telemetry    SR with short runs of NSVT, less freq this morning - Personally Reviewed  ECG    No new tracing this morning   Physical Exam   GEN: No acute distress.   Neck: No JVD Cardiac: RRR, no murmurs, rubs, or gallops.  Respiratory: Clear to auscultation bilaterally. GI: Soft, nontender, non-distended  MS: No edema; No deformity. Neuro:  Nonfocal  Psych: Normal affect   Labs     High Sensitivity Troponin:   Recent Labs  Lab 11/03/20 1423 11/03/20 2227  TROPONINIHS >24,000* >24,000*     Chemistry Recent Labs  Lab 11/03/20 0603 11/03/20 0622 11/03/20 1016 11/04/20 0614  NA 133* 136 136 135  K 3.8 3.8 4.3 3.8  CL 98 98  --  105  CO2 25  --   --  22  GLUCOSE 194* 201*  --  121*  BUN 11 13  --  11  CREATININE 0.93 0.70  --  0.89  CALCIUM 10.2  --   --  9.5  PROT 7.5  --   --   --   ALBUMIN 4.0  --   --   --   AST 83*  --   --   --   ALT 30  --   --   --   ALKPHOS 131*  --   --   --   BILITOT 1.8*  --   --   --   GFRNONAA >60  --   --  >60  ANIONGAP 10  --   --  8    Lipids  Recent Labs  Lab 11/04/20 0614  CHOL 159  TRIG 187*  HDL 31*  LDLCALC 91  CHOLHDL 5.1    Hematology Recent Labs  Lab 11/03/20 0603 11/03/20 0622 11/03/20 1016 11/04/20 0614  WBC 15.1*  --   --  12.3*  RBC 5.02  --   --  4.04*  HGB 16.8 17.3* 16.3 13.6  HCT 47.1 51.0 48.0 39.3  MCV 93.8  --   --  97.3  MCH 33.5  --   --  33.7  MCHC 35.7  --   --  34.6  RDW 12.1  --   --  12.6  PLT 210  --   --  145*   Thyroid No results for input(s): TSH, FREET4 in the last 168 hours.  BNPNo results for input(s): BNP, PROBNP in the last 168 hours.  DDimer No results for input(s): DDIMER in the last 168 hours.   Radiology    MR ANGIO HEAD WO CONTRAST  Result Date: 11/04/2020 CLINICAL DATA:  Stroke follow-up EXAM: MRI HEAD WITHOUT CONTRAST MRA HEAD WITHOUT CONTRAST TECHNIQUE: Multiplanar, multi-echo pulse sequences of the brain and surrounding structures were acquired without intravenous contrast. Angiographic images of the Circle of Willis were acquired using MRA technique without intravenous contrast. COMPARISON:  No pertinent prior exam. FINDINGS: MRI HEAD FINDINGS Brain: Patchy acute cortical infarct along the right MCA and border zone territory but also seen to a lesser extent in the white matter and striatum on the right. Less extensive acute infarcts in the lateral  left frontal lobe, MCA territory. Pre-existing cortical infarcts asymmetric to the left. Petechial hemorrhage on the right and hemosiderin staining on the left. No hydrocephalus, collection, or shift Vascular: Absent left ICA flow void Skull and upper cervical spine: Normal marrow signal Sinuses/Orbits: Negative MRA HEAD FINDINGS Anterior circulation: Faint flow related signal in the left cervical ICA but none seen at the skull base or intracranially until ramification by the left posterior communicating artery. The right M1 and M2 branches are widely patent. No new embolus. Posterior circulation: Dominant right vertebral artery. The distal basilar divides at the level of the superior cerebellar arteries. No branch occlusion or aneurysm Anatomic variants: None significant IMPRESSION: 1. Patchy acute cerebral infarcts on the right more than left with right-sided petechial hemorrhage. 2. No residual or recurrent right MCA thrombus. 3. No detected flow in the intracranial left ICA but well compensated by the left posterior communicating artery. Chronic critical stenosis with underfilling of the left ICA bulb. Electronically Signed   By: Jorje Guild M.D.   On: 11/04/2020 08:09   MR BRAIN WO CONTRAST  Result Date: 11/04/2020 CLINICAL DATA:  Stroke follow-up EXAM: MRI HEAD WITHOUT CONTRAST MRA HEAD WITHOUT CONTRAST TECHNIQUE: Multiplanar, multi-echo pulse sequences of the brain and surrounding structures were acquired without intravenous contrast. Angiographic images of the Circle of Willis were acquired using MRA technique without intravenous contrast. COMPARISON:  No pertinent prior exam. FINDINGS: MRI HEAD FINDINGS Brain: Patchy acute cortical infarct along the right MCA and border zone territory but also seen to a lesser extent in the white matter and striatum on the right. Less extensive acute infarcts in the lateral left frontal lobe, MCA territory. Pre-existing cortical infarcts asymmetric to the left.  Petechial hemorrhage on the right and hemosiderin staining on the left. No hydrocephalus, collection, or shift Vascular: Absent left ICA flow void Skull and upper cervical spine: Normal marrow signal Sinuses/Orbits: Negative MRA HEAD FINDINGS Anterior circulation: Faint flow related signal in the left cervical ICA but none seen at the skull  base or intracranially until ramification by the left posterior communicating artery. The right M1 and M2 branches are widely patent. No new embolus. Posterior circulation: Dominant right vertebral artery. The distal basilar divides at the level of the superior cerebellar arteries. No branch occlusion or aneurysm Anatomic variants: None significant IMPRESSION: 1. Patchy acute cerebral infarcts on the right more than left with right-sided petechial hemorrhage. 2. No residual or recurrent right MCA thrombus. 3. No detected flow in the intracranial left ICA but well compensated by the left posterior communicating artery. Chronic critical stenosis with underfilling of the left ICA bulb. Electronically Signed   By: Jorje Guild M.D.   On: 11/04/2020 08:09   Portable Chest x-ray  Result Date: 11/03/2020 CLINICAL DATA:  Status post endotracheal tube and OG tube placement. EXAM: PORTABLE CHEST 1 VIEW COMPARISON:  Chest x-ray dated 12/02/2015. FINDINGS: Endotracheal tube appears well positioned with tip above the level of the carina. Enteric tube passes below the diaphragm. Mild central pulmonary vascular congestion. Probable mild atelectasis at the medial aspect of the RIGHT lung base. Coarse interstitial markings throughout the remainder of both lungs. No pleural effusion or pneumothorax is seen. IMPRESSION: 1. Endotracheal and enteric tubes appear well positioned. 2. Central pulmonary vascular congestion and coarse interstitial markings throughout both lungs, most likely interstitial edema superimposed on chronic interstitial lung disease. 3. Probable atelectasis at the RIGHT  lung base. Electronically Signed   By: Franki Cabot M.D.   On: 11/03/2020 09:53   ECHOCARDIOGRAM COMPLETE  Result Date: 11/03/2020    ECHOCARDIOGRAM REPORT   Patient Name:   Christopher Lang Date of Exam: 11/03/2020 Medical Rec #:  [id number]       Height:       72.0 in Accession #:    [id number]      Weight:       193.1 lb Date of Birth:  August 06, 1955       BSA:          2.099 m Patient Age:    65 years        BP:           141/86 mmHg Patient Gender: M               HR:           74 bpm. Exam Location:  Inpatient Procedure: 2D Echo, Cardiac Doppler, Color Doppler and Intracardiac            Opacification Agent Indications:    Acute myocardial infarction  History:        Patient has prior history of Echocardiogram examinations, most                 recent 06/07/2019. CHF, COPD, Arrythmias:Atrial Fibrillation;                 Risk Factors:Current Smoker, Dyslipidemia, Hypertension and                 Sleep Apnea.  Sonographer:    Clayton Lefort RDCS (AE) Referring Phys: [id number] Verdi  1. The mid to distal septum/anterior segments are akinetic. The entire apex is akinetic. The myocardium appears thin. Suspect these findings represent a large wrap around LAD infarction (troponin >24,000). No LV thrombus on contrast imaging. There is severe LV dysfunction, EF 20-25%. Left ventricular ejection fraction, by estimation, is 20 to 25%. The left ventricle has severely decreased function. The left ventricle demonstrates regional wall motion abnormalities (see scoring diagram/findings for  description). There is mild left ventricular hypertrophy. Left ventricular diastolic parameters are consistent with Grade I diastolic dysfunction (impaired relaxation).  2. Right ventricular systolic function is normal. The right ventricular size is normal. Tricuspid regurgitation signal is inadequate for assessing PA pressure.  3. The mitral valve is grossly normal. Trivial mitral valve regurgitation. No evidence of mitral  stenosis.  4. The aortic valve is tricuspid. There is moderate calcification of the aortic valve. Aortic valve regurgitation is not visualized. Mild to moderate aortic valve sclerosis/calcification is present, without any evidence of aortic stenosis.  5. The inferior vena cava is normal in size with <50% respiratory variability, suggesting right atrial pressure of 8 mmHg. Comparison(s): Changes from prior study are noted. LVEF now severely reduced 20-25%. WMA suggest LAD infarction. Conclusion(s)/Recommendation(s): Findings consistent with ischemic cardiomyopathy. FINDINGS  Left Ventricle: The mid to distal septum/anterior segments are akinetic. The entire apex is akinetic. The myocardium appears thin. Suspect these findings represent a large wrap around LAD infarction (troponin >24,000). No LV thrombus on contrast imaging. There is severe LV dysfunction, EF 20-25%. Left ventricular ejection fraction, by estimation, is 20 to 25%. The left ventricle has severely decreased function. The left ventricle demonstrates regional wall motion abnormalities. Definity contrast  agent was given IV to delineate the left ventricular endocardial borders. The left ventricular internal cavity size was normal in size. There is mild left ventricular hypertrophy. Left ventricular diastolic parameters are consistent with Grade I diastolic dysfunction (impaired relaxation).  LV Wall Scoring: The mid and distal anterior wall, mid and distal anterior septum, entire apex, and mid inferoseptal segment are akinetic. Right Ventricle: The right ventricular size is normal. No increase in right ventricular wall thickness. Right ventricular systolic function is normal. Tricuspid regurgitation signal is inadequate for assessing PA pressure. Left Atrium: Left atrial size was normal in size. Right Atrium: Right atrial size was normal in size. Pericardium: There is no evidence of pericardial effusion. Mitral Valve: The mitral valve is grossly normal.  Mild mitral annular calcification. Trivial mitral valve regurgitation. No evidence of mitral valve stenosis. MV peak gradient, 5.6 mmHg. The mean mitral valve gradient is 3.0 mmHg. Tricuspid Valve: The tricuspid valve is grossly normal. Tricuspid valve regurgitation is trivial. No evidence of tricuspid stenosis. Aortic Valve: The aortic valve is tricuspid. There is moderate calcification of the aortic valve. Aortic valve regurgitation is not visualized. Mild to moderate aortic valve sclerosis/calcification is present, without any evidence of aortic stenosis. Aortic valve mean gradient measures 4.0 mmHg. Aortic valve peak gradient measures 7.6 mmHg. Aortic valve area, by VTI measures 1.80 cm. Pulmonic Valve: The pulmonic valve was grossly normal. Pulmonic valve regurgitation is trivial. No evidence of pulmonic stenosis. Aorta: The aortic root is normal in size and structure. Venous: The inferior vena cava is normal in size with less than 50% respiratory variability, suggesting right atrial pressure of 8 mmHg. IAS/Shunts: The atrial septum is grossly normal.  LEFT VENTRICLE PLAX 2D LVIDd:         4.40 cm   Diastology LVIDs:         3.10 cm   LV e' medial:    6.53 cm/s LV PW:         1.20 cm   LV E/e' medial:  12.5 LV IVS:        1.20 cm   LV e' lateral:   6.75 cm/s LVOT diam:     2.00 cm   LV E/e' lateral: 12.1 LV SV:  54 LV SV Index:   26 LVOT Area:     3.14 cm                           3D Volume EF:                          3D EF:        42 %                          LV EDV:       154 ml                          LV ESV:       89 ml                          LV SV:        64 ml RIGHT VENTRICLE             IVC RV Basal diam:  2.90 cm     IVC diam: 1.90 cm RV S prime:     16.10 cm/s TAPSE (M-mode): 2.3 cm LEFT ATRIUM           Index        RIGHT ATRIUM           Index LA diam:      3.30 cm 1.57 cm/m   RA Area:     19.20 cm LA Vol (A4C): 45.0 ml 21.44 ml/m  RA Volume:   56.10 ml  26.73 ml/m  AORTIC VALVE AV  Area (Vmax):    2.05 cm AV Area (Vmean):   1.83 cm AV Area (VTI):     1.80 cm AV Vmax:           138.00 cm/s AV Vmean:          98.600 cm/s AV VTI:            0.298 m AV Peak Grad:      7.6 mmHg AV Mean Grad:      4.0 mmHg LVOT Vmax:         90.10 cm/s LVOT Vmean:        57.500 cm/s LVOT VTI:          0.171 m LVOT/AV VTI ratio: 0.57  AORTA Ao Root diam: 3.50 cm Ao Asc diam:  2.90 cm MITRAL VALVE MV Area (PHT): 3.66 cm     SHUNTS MV Area VTI:   1.70 cm     Systemic VTI:  0.17 m MV Peak grad:  5.6 mmHg     Systemic Diam: 2.00 cm MV Mean grad:  3.0 mmHg MV Vmax:       1.18 m/s MV Vmean:      75.5 cm/s MV Decel Time: 207 msec MV E velocity: 81.50 cm/s MV A velocity: 128.00 cm/s MV E/A ratio:  0.64 Eleonore Chiquito MD Electronically signed by Eleonore Chiquito MD Signature Date/Time: 11/03/2020/4:49:02 PM    Final    CT HEAD CODE STROKE WO CONTRAST  Result Date: 11/03/2020 CLINICAL DATA:  Code stroke. EXAM: CT HEAD WITHOUT CONTRAST TECHNIQUE: Contiguous axial images were obtained from the base of the skull through the vertex without intravenous contrast. COMPARISON:  11/01/2013 FINDINGS: Brain: On coronal reformats there is equivocal cortical indistinctness  of the right frontal operculum. No hemorrhage or hydrocephalus. Negative for mass. Patchy remote cortically based infarcts at the left vertex. Vascular: Hyperdense right M1 segment Skull: Normal. Negative for fracture or focal lesion. Sinuses/Orbits: No acute finding. Other: Page to Dr. Lorrin Goodell at 6:24 am on 11/03/2020 by text page via the [facility] messaging system. ASPECTS [facility] Stroke Program Early CT Score) - Ganglionic level infarction (caudate, lentiform nuclei, internal capsule, insula, M1-M3 cortex): 6 or 7 - Supraganglionic infarction (M4-M6 cortex): 3 Total score (0-10 with 10 being normal): 9 or 10 IMPRESSION: 1. Dense right M1 segment suspicious for thrombosis. 2. Equivocal for acute infarct at the right insula/operculum. 3. No acute hemorrhage. 4.  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 Electronically Signed   By: Jorje Guild M.D.   On: 11/03/2020 06:27   CT ANGIO HEAD CODE STROKE  Result Date: 11/03/2020 CLINICAL DATA:  Assess intracranial arteries. EXAM: CT ANGIOGRAPHY HEAD AND NECK TECHNIQUE: Multidetector CT imaging of the head and neck was performed using the standard protocol during bolus administration of intravenous contrast. Multiplanar CT image reconstructions and MIPs were obtained to evaluate the vascular anatomy. Carotid stenosis measurements (when applicable) are obtained utilizing NASCET criteria, using the distal internal carotid diameter as the denominator. CONTRAST:  120mL OMNIPAQUE IOHEXOL 350 MG/ML SOLN COMPARISON:  None similar FINDINGS: CTA NECK FINDINGS Aortic arch: Atheromatous plaque.  Three vessel branching. Right carotid system: Primarily calcific atherosclerosis with severe ICA origin stenosis to the degree that the lumen is not accurately measurable. No dissection. Left carotid system: Atherosclerosis with bulky mixed density plaque at the left ICA bulb with severe stenosis causing downstream faint underfilling Vertebral arteries: Proximal subclavian atherosclerosis with up to 60% left origin stenosis. Dominant right vertebral artery with atherosclerotic calcification but wide patency to the dura. Faint intermittent flow in the left vertebral artery. Skeleton: Ordinary cervical spine degeneration Other neck: No acute finding Upper chest: Paraseptal emphysema. Review of the MIP images confirms the above findings CTA HEAD FINDINGS Anterior circulation: Heavily calcified carotid siphons with at least moderate stenosis on the right. TOn the left there is quantification due to underfilling. Right M1 occlusion with faint downstream reconstitution. Major vessels on the left show better patency. Negative for aneurysm Posterior circulation: Vertebral and basilar arteries are widely patent. Fetal type bilateral PCA  flow. No PCA branch occlusion. Venous sinuses: Unremarkable Anatomic variants: As above Review of the MIP images confirms the above findings IMPRESSION: 1. Emergent large vessel occlusion at the right M1 segment. 2. Severe bilateral ICA origin stenosis with underfilling left. 3. Thready intermittent flow in the non dominant left vertebral artery. 4. 60% left subclavian origin stenosis. Electronically Signed   By: Jorje Guild M.D.   On: 11/03/2020 06:41   CT ANGIO NECK CODE STROKE  Result Date: 11/03/2020 CLINICAL DATA:  Assess intracranial arteries. EXAM: CT ANGIOGRAPHY HEAD AND NECK TECHNIQUE: Multidetector CT imaging of the head and neck was performed using the standard protocol during bolus administration of intravenous contrast. Multiplanar CT image reconstructions and MIPs were obtained to evaluate the vascular anatomy. Carotid stenosis measurements (when applicable) are obtained utilizing NASCET criteria, using the distal internal carotid diameter as the denominator. CONTRAST:  148mL OMNIPAQUE IOHEXOL 350 MG/ML SOLN COMPARISON:  None similar FINDINGS: CTA NECK FINDINGS Aortic arch: Atheromatous plaque.  Three vessel branching. Right carotid system: Primarily calcific atherosclerosis with severe ICA origin stenosis to the degree that the lumen is not accurately measurable. No dissection. Left carotid system: Atherosclerosis with bulky mixed  density plaque at the left ICA bulb with severe stenosis causing downstream faint underfilling Vertebral arteries: Proximal subclavian atherosclerosis with up to 60% left origin stenosis. Dominant right vertebral artery with atherosclerotic calcification but wide patency to the dura. Faint intermittent flow in the left vertebral artery. Skeleton: Ordinary cervical spine degeneration Other neck: No acute finding Upper chest: Paraseptal emphysema. Review of the MIP images confirms the above findings CTA HEAD FINDINGS Anterior circulation: Heavily calcified carotid  siphons with at least moderate stenosis on the right. TOn the left there is quantification due to underfilling. Right M1 occlusion with faint downstream reconstitution. Major vessels on the left show better patency. Negative for aneurysm Posterior circulation: Vertebral and basilar arteries are widely patent. Fetal type bilateral PCA flow. No PCA branch occlusion. Venous sinuses: Unremarkable Anatomic variants: As above Review of the MIP images confirms the above findings IMPRESSION: 1. Emergent large vessel occlusion at the right M1 segment. 2. Severe bilateral ICA origin stenosis with underfilling left. 3. Thready intermittent flow in the non dominant left vertebral artery. 4. 60% left subclavian origin stenosis. Electronically Signed   By: Jorje Guild M.D.   On: 11/03/2020 06:41    Cardiac Studies   Echo: 11/03/20  IMPRESSIONS     1. The mid to distal septum/anterior segments are akinetic. The entire  apex is akinetic. The myocardium appears thin. Suspect these findings  represent a large wrap around LAD infarction (troponin >24,000). No LV  thrombus on contrast imaging. There is  severe LV dysfunction, EF 20-25%. Left ventricular ejection fraction, by  estimation, is 20 to 25%. The left ventricle has severely decreased  function. The left ventricle demonstrates regional wall motion  abnormalities (see scoring diagram/findings for  description). There is mild left ventricular hypertrophy. Left ventricular  diastolic parameters are consistent with Grade I diastolic dysfunction  (impaired relaxation).   2. Right ventricular systolic function is normal. The right ventricular  size is normal. Tricuspid regurgitation signal is inadequate for assessing  PA pressure.   3. The mitral valve is grossly normal. Trivial mitral valve  regurgitation. No evidence of mitral stenosis.   4. The aortic valve is tricuspid. There is moderate calcification of the  aortic valve. Aortic valve regurgitation  is not visualized. Mild to  moderate aortic valve sclerosis/calcification is present, without any  evidence of aortic stenosis.   5. The inferior vena cava is normal in size with <50% respiratory  variability, suggesting right atrial pressure of 8 mmHg.   Comparison(s): Changes from prior study are noted. LVEF now severely  reduced 20-25%. WMA suggest LAD infarction.   Conclusion(s)/Recommendation(s): Findings consistent with ischemic  cardiomyopathy.   FINDINGS   Left Ventricle: The mid to distal septum/anterior segments are akinetic.  The entire apex is akinetic. The myocardium appears thin. Suspect these  findings represent a large wrap around LAD infarction (troponin >24,000).  No LV thrombus on contrast  imaging. There is severe LV dysfunction, EF 20-25%. Left ventricular  ejection fraction, by estimation, is 20 to 25%. The left ventricle has  severely decreased function. The left ventricle demonstrates regional wall  motion abnormalities. Definity contrast   agent was given IV to delineate the left ventricular endocardial borders.  The left ventricular internal cavity size was normal in size. There is  mild left ventricular hypertrophy. Left ventricular diastolic parameters  are consistent with Grade I  diastolic dysfunction (impaired relaxation).      LV Wall Scoring:  The mid and distal anterior wall, mid and distal  anterior septum, entire  apex, and mid inferoseptal segment are akinetic.   Right Ventricle: The right ventricular size is normal. No increase in  right ventricular wall thickness. Right ventricular systolic function is  normal. Tricuspid regurgitation signal is inadequate for assessing PA  pressure.   Left Atrium: Left atrial size was normal in size.   Right Atrium: Right atrial size was normal in size.   Pericardium: There is no evidence of pericardial effusion.   Mitral Valve: The mitral valve is grossly normal. Mild mitral annular  calcification.  Trivial mitral valve regurgitation. No evidence of mitral  valve stenosis. MV peak gradient, 5.6 mmHg. The mean mitral valve gradient  is 3.0 mmHg.   Tricuspid Valve: The tricuspid valve is grossly normal. Tricuspid valve  regurgitation is trivial. No evidence of tricuspid stenosis.   Aortic Valve: The aortic valve is tricuspid. There is moderate  calcification of the aortic valve. Aortic valve regurgitation is not  visualized. Mild to moderate aortic valve sclerosis/calcification is  present, without any evidence of aortic stenosis.  Aortic valve mean gradient measures 4.0 mmHg. Aortic valve peak gradient  measures 7.6 mmHg. Aortic valve area, by VTI measures 1.80 cm.   Pulmonic Valve: The pulmonic valve was grossly normal. Pulmonic valve  regurgitation is trivial. No evidence of pulmonic stenosis.   Aorta: The aortic root is normal in size and structure.   Venous: The inferior vena cava is normal in size with less than 50%  respiratory variability, suggesting right atrial pressure of 8 mmHg.   IAS/Shunts: The atrial septum is grossly normal.   Patient Profile     65 y.o. male  with a hx of P.afib, HTN, HLD, COPD, OSA who is being seen 11/03/2020 for the evaluation of chest pain, left sided weakness at the request of ER doc- Dr Leonette Monarch.  Assessment & Plan    Acute Stroke: right MCA infarct 2/2 right M1 occlusion. Received TNK and went to IR for revascularization. S/p extubation 10/16 pm, currently on [place] @2l . Alert and oriented this morning. -- MRI--> patchy acute cerebral infarcts on the right sided petechial hemorrhage, no residual right MCA thrombus.  -- management per neurology  Anterior STEMI: EKG on admission with STE in anteroseptal leads, TWI in anterolateral leads. hsTn peaked >24000. Evaluated in the ED and deemed not a candidate for invasive cath with acute CVA s/p TNK. Plan for medical management. No chest pain this morning. Not on IV heparin as he is s/p TNK in the  setting of acute CVA.  -- on statin and BB. Initially recommended for ASA/plavix once cleared from neurology standpoint, does have right sided petechial hemorrhage on MRI. Defer antiplatelet initiation to neurology discretion  HFrEF: Echo showed EF of 20-25% with mid to distal akinesis in distal septum and apex concerning for large wrap around LAD infarction. No LV thrombus noted. G1DD.  -- Started on metoprolol 25mg  BID on admission, will need additional GDMT with ARB/Entresto, spiro +/- SGLT2 once more stable from neurology stand point.   HTN: improving. Tolerating metoprolol 25mg  BID, remains on cleviprex gtt.   HLD: on atoravstatin 10mg  daily, LDL 91 -- further increase to 40mg  daily  Tobacco use: cessation education   For questions or updates, please contact [place] Please consult [email or web address] for contact info under        Signed, Reino Bellis, NP  11/04/2020, 8:33 AM    I have examined the patient and reviewed assessment and plan and discussed with patient.  Agree with  above as stated.    S/p CVA and anterior MI.  LVEF decreased.  Review of his prior cardiology visit with Dr. Domenic Polite shows that he did have atrial fibrillation back in 2017.  He has not been on anticoagulation.  This certainly increases risk of a CVA.  Carotid disease was noted as well on the angiogram.  Atrial fibrillation could potentially cause both a CVA and MI, if there was emboli to both the brain and the heart.  Once cleared from a cardiac standpoint, will need anticoagulation.  He is at high risk for LV thrombus formation as well given his unrevascularized likely LAD occlusion.  Would likely use clopidogrel only and no aspirin once a DOAC is started.  He will need medications for LV dysfunction as well.  We will start these when his blood pressure target can be lowered.  ECG today consistent with evolving anterior MI.  It is not an acute STEMI.  Larae Grooms

## 2020-11-04 NOTE — Progress Notes (Addendum)
STROKE TEAM PROGRESS NOTE   INTERVAL HISTORY No family is at the bedside.  Pt was extubated yesterday afternoon, tolerating well. Now awake alert, following commands, conversing well, passed swallow. EKG still showed anteriorlateral STEMI, cardiology recommend DAPT.   Vitals:   11/04/20 1000 11/04/20 1030 11/04/20 1100 11/04/20 1200  BP: 140/77 138/68 136/75   Pulse: 92 69 64   Resp: 16 16 19    Temp:    98.5 F (36.9 C)  TempSrc:    Oral  SpO2: 92% 93% 96%   Weight:      Height:       CBC:  Recent Labs  Lab 11/03/20 0603 11/03/20 0622 11/03/20 1016 11/04/20 0614  WBC 15.1*  --   --  12.3*  NEUTROABS 13.8*  --   --   --   HGB 16.8   < > 16.3 13.6  HCT 47.1   < > 48.0 39.3  MCV 93.8  --   --  97.3  PLT 210  --   --  145*   < > = values in this interval not displayed.   Basic Metabolic Panel:  Recent Labs  Lab 11/03/20 0603 11/03/20 0622 11/03/20 1016 11/04/20 0614  NA 133* 136 136 135  K 3.8 3.8 4.3 3.8  CL 98 98  --  105  CO2 25  --   --  22  GLUCOSE 194* 201*  --  121*  BUN 11 13  --  11  CREATININE 0.93 0.70  --  0.89  CALCIUM 10.2  --   --  9.5   Lipid Panel:  Recent Labs  Lab 11/04/20 0614  CHOL 159  TRIG 187*  HDL 31*  CHOLHDL 5.1  VLDL 37  LDLCALC 91    HgbA1c:  Recent Labs  Lab 11/03/20 1137  HGBA1C 4.9   Urine Drug Screen: No results for input(s): LABOPIA, COCAINSCRNUR, LABBENZ, AMPHETMU, THCU, LABBARB in the last 168 hours.  Alcohol Level No results for input(s): ETH in the last 168 hours.  IMAGING past 24 hours MR ANGIO HEAD WO CONTRAST  Result Date: 11/04/2020 CLINICAL DATA:  Stroke follow-up EXAM: MRI HEAD WITHOUT CONTRAST MRA HEAD WITHOUT CONTRAST TECHNIQUE: Multiplanar, multi-echo pulse sequences of the brain and surrounding structures were acquired without intravenous contrast. Angiographic images of the Circle of Willis were acquired using MRA technique without intravenous contrast. COMPARISON:  No pertinent prior exam. FINDINGS:  MRI HEAD FINDINGS Brain: Patchy acute cortical infarct along the right MCA and border zone territory but also seen to a lesser extent in the white matter and striatum on the right. Less extensive acute infarcts in the lateral left frontal lobe, MCA territory. Pre-existing cortical infarcts asymmetric to the left. Petechial hemorrhage on the right and hemosiderin staining on the left. No hydrocephalus, collection, or shift Vascular: Absent left ICA flow void Skull and upper cervical spine: Normal marrow signal Sinuses/Orbits: Negative MRA HEAD FINDINGS Anterior circulation: Faint flow related signal in the left cervical ICA but none seen at the skull base or intracranially until ramification by the left posterior communicating artery. The right M1 and M2 branches are widely patent. No new embolus. Posterior circulation: Dominant right vertebral artery. The distal basilar divides at the level of the superior cerebellar arteries. No branch occlusion or aneurysm Anatomic variants: None significant IMPRESSION: 1. Patchy acute cerebral infarcts on the right more than left with right-sided petechial hemorrhage. 2. No residual or recurrent right MCA thrombus. 3. No detected flow in the intracranial left  ICA but well compensated by the left posterior communicating artery. Chronic critical stenosis with underfilling of the left ICA bulb. Electronically Signed   By: Jorje Guild M.D.   On: 11/04/2020 08:09   MR BRAIN WO CONTRAST  Result Date: 11/04/2020 CLINICAL DATA:  Stroke follow-up EXAM: MRI HEAD WITHOUT CONTRAST MRA HEAD WITHOUT CONTRAST TECHNIQUE: Multiplanar, multi-echo pulse sequences of the brain and surrounding structures were acquired without intravenous contrast. Angiographic images of the Circle of Willis were acquired using MRA technique without intravenous contrast. COMPARISON:  No pertinent prior exam. FINDINGS: MRI HEAD FINDINGS Brain: Patchy acute cortical infarct along the right MCA and border zone  territory but also seen to a lesser extent in the white matter and striatum on the right. Less extensive acute infarcts in the lateral left frontal lobe, MCA territory. Pre-existing cortical infarcts asymmetric to the left. Petechial hemorrhage on the right and hemosiderin staining on the left. No hydrocephalus, collection, or shift Vascular: Absent left ICA flow void Skull and upper cervical spine: Normal marrow signal Sinuses/Orbits: Negative MRA HEAD FINDINGS Anterior circulation: Faint flow related signal in the left cervical ICA but none seen at the skull base or intracranially until ramification by the left posterior communicating artery. The right M1 and M2 branches are widely patent. No new embolus. Posterior circulation: Dominant right vertebral artery. The distal basilar divides at the level of the superior cerebellar arteries. No branch occlusion or aneurysm Anatomic variants: None significant IMPRESSION: 1. Patchy acute cerebral infarcts on the right more than left with right-sided petechial hemorrhage. 2. No residual or recurrent right MCA thrombus. 3. No detected flow in the intracranial left ICA but well compensated by the left posterior communicating artery. Chronic critical stenosis with underfilling of the left ICA bulb. Electronically Signed   By: Jorje Guild M.D.   On: 11/04/2020 08:09   IR PTA Intracranial  Result Date: 11/04/2020 INDICATION: 65 year old male presents with acute right M1 occlusion, emergent large vessel occlusion, for mechanical thrombectomy EXAM: ULTRASOUND-GUIDED ACCESS RIGHT COMMON FEMORAL ARTERY ULTRASOUND-GUIDED ACCESS LEFT COMMON FEMORAL ARTERY RIGHT CERVICAL AND CEREBRAL ANGIOGRAM BALLOON ANGIOPLASTY CRITICAL STENOSIS RIGHT ICA RIGHT M1 MECHANICAL THROMBECTOMY ANGIO-SEAL FOR HEMOSTASIS. COMPARISON:  CT IMAGING SAME DAY MEDICATIONS: Preoperative T NK ANESTHESIA/SEDATION: The anesthesia team was present to provide general endotracheal tube anesthesia and for  patient monitoring during the procedure. Intubation was performed in biplane/neuro interventional suite. Left radial arterial line was performed by the anesthesia team. Interventional neuro radiology nursing staff was also present. CONTRAST:  70 cc omni 350 FLUOROSCOPY TIME:  Fluoroscopy Time: 17 minutes 48 seconds (1029 mGy). COMPLICATIONS: None TECHNIQUE: Informed written consent was obtained from the patient's family after a thorough discussion of the procedural risks, benefits and alternatives. Specific risks discussed include: Bleeding, infection, contrast reaction, kidney injury/failure, need for further procedure/surgery, arterial injury or dissection, embolization to new territory, intracranial hemorrhage (10-15% risk), neurologic deterioration, cardiopulmonary collapse, death. All questions were addressed. Maximal Sterile Barrier Technique was utilized including during the procedure including caps, mask, sterile gowns, sterile gloves, sterile drape, hand hygiene and skin antiseptic. A timeout was performed prior to the initiation of the procedure. The anesthesia team was present to provide general endotracheal tube anesthesia and for patient monitoring during the procedure. Interventional neuro radiology nursing staff was also present. FINDINGS: Initial Findings: Right common carotid artery:  Normal course caliber and contour. Right external carotid artery: Patent with antegrade flow. Atherosclerotic plaque extends into the proximal ECA with estimated 50 percent narrowing at the origin. Right  internal carotid artery: Advanced atherosclerotic changes at the right carotid bifurcation, with at least 50 percent circumferential calcified plaque in the proximal right ICA, and extension of plaque into the proximal right external carotid artery. By NASCET criteria, the initial stenosis measures at least 81 percent . After balloon angioplasty, stenosis measures less than 50 percent. Right MCA: Since the prior CT,  there has been migration of the thrombus into the inferior division, with a superior division frontal branch filling. There is some contamination on the initial angiogram by the left-sided MCA branches given the significant right to left cross fill of the patent anterior communicating artery. Right ACA: A 1 segment patent. A 2 segment perfuses the right territory. Significant cross fill to the left. Completion Findings: Right MCA: TICI 3: Complete perfusion of the territory Significant cross fill persists from the right to the left. Less than 50 percent stenosis at the cervical ICA origin. PROCEDURE: The anesthesia team was present to provide general endotracheal tube anesthesia and for patient monitoring during the procedure. Intubation was performed in negative pressure [place] in neuro IR holding. Interventional neuro radiology nursing staff was also present. Ultrasound survey of the left inguinal region was performed with images stored and sent to PACs, confirming patency of the vessel. A micropuncture needle was used access the left common femoral artery under ultrasound. With excellent arterial blood flow returned, and an .018 micro wire was passed through the needle, observed enter the abdominal aorta under fluoroscopy. The needle was removed, and a micropuncture sheath was placed over the wire. The inner dilator and wire were removed, and an 035 Bentson wire was advanced under fluoroscopy into the abdominal aorta. The sheath was removed and a standard 4 [place] vascular sheath was placed. The dilator was removed and the sheath was flushed. Ultrasound survey of the right inguinal region was performed with images stored and sent to PACs. Patency of the right common femoral artery confirmed. 11 blade scalpel was used to make a small incision. Blunt dissection was performed with US guidance. A micropuncture needle was used access the right common femoral artery under ultrasound. With excellent arterial blood flow  returned, an .018 micro wire was passed through the needle, observed to enter the abdominal aorta under fluoroscopy. The needle was removed, and a micropuncture sheath was placed over the wire. The inner dilator and wire were removed, and an 035 wire was advanced under fluoroscopy into the abdominal aorta. The sheath was removed and a 25cm 53F straight vascular sheath was placed. The dilator was removed and the sheath was flushed. Sheath was attached to pressurized and heparinized saline bag for constant forward flow. JB 1 catheter was advanced with a Glidewire to the aortic arch. The Glidewire was removed and a double flush was performed. JB 1 was then used to select the innominate artery and the right common carotid artery origin. Catheter was advanced to the distal common carotid artery and the wire was removed. Angiogram was performed. Once we assessed the significant stenosis in the proximal right ICA, we elected to proceed 1st with balloon angioplasty. Exchange length roadrunner was advanced into the external carotid artery branches and the JB 1 was removed. A coaxial system was then advanced over the 035 wire. This included a 95cm 087 "Walrus" balloon guide with coaxial 100cm JB 1 diagnostic catheter. This was advanced to the distal common carotid artery. Wire and catheter were then removed. Double flush of the balloon guide was performed. Angiogram was performed for roadmap technique. Using  the roadmap technique, A synchro soft wire and rapid transit catheter were used to cross the irregular stenotic plaque of the right ICA. Catheter wire were advanced into the distal cervical ICA and the wire was removed. A floppy transcend exchange length wire was then placed through the catheter and the catheter was removed. We then proceeded with balloon angioplasty of the cervical ICA segment with a 5 mm x 30 mm Viatrac rapid exchange system. Ten atmospheric pressure was observed. As the balloon was slowly deflated, the  balloon guide was advanced over the balloon and wire combination to the distal cervical segment. The balloon and wire were then removed from the system and double flush was performed at the balloon guide. Formal angiogram was performed. Road map function was used once the occluded vessel was identified. Copious back flush was performed and the balloon catheter was attached to heparinized and pressurized saline bag for forward flow. A second coaxial system was then advanced through the balloon catheter, which included the selected intermediate catheter, microcatheter, and microwire. In this scenario, the set up included a 132cm CAT-5 intermediate catheter, a Trevo Provue18 microcatheter, and 014 synchro soft wire. This system was advanced through the balloon guide catheter under the road-map function, with adequate back-flush at the rotating hemostatic valve at that back end of the balloon guide. Microcatheter and the intermediate catheter system were advanced through the terminal ICA and MCA to the level of the occlusion. The micro wire was then carefully advanced through the occluded segment. Microcatheter was then manipulated through the occluded segment and the wire was removed with saline drip at the hub. Intermediate catheter was advanced into the proximal M1 segment. Blood was then aspirated through the hub of the microcatheter, and a gentle contrast injection was performed confirming intraluminal position. A rotating hemostatic valve was then attached to the back end of the microcatheter, and a pressurized and heparinized saline bag was attached to the catheter. 4 x 40 solitaire device was then selected. Back flush was achieved at the rotating hemostatic valve, and then the device was gently advanced through the microcatheter to the distal end. The retriever was then unsheathed by withdrawing the microcatheter under fluoroscopy. Once the retriever was completely unsheathed, the microcatheter was carefully  stripped from the delivery device. Control angiogram was performed from the intermediate catheter. A 3 minute time interval was observed. The intermediate catheter was then attached to the proprietary suction engine and the catheter was slowly advanced over the solitaire until we observed stasis of flow within the intermediate catheter. The balloon at the balloon guide catheter was then inflated under fluoroscopy for proximal flow arrest. Constant aspiration using the proprietary engine was then performed at the intermediate catheter, as the retriever was gently and slowly withdrawn with fluoroscopic observation. Once the retriever was "corked" within the tip of the intermediate catheter, both were removed from the system. Free aspiration was confirmed at the hub of the balloon guide catheter, with free blood return confirmed. The balloon was then deflated, and a control angiogram was performed. Restoration of flow was confirmed. Balloon guide was withdrawn to the common carotid artery segment after placing the transcend wire to the distal cervical segment, to maintain wire cross the ICA origin. Angiogram of the cervical ICA was performed. Given the improved diameter on the conclusion, we elected not to deployed stent at this time, as that would require dual anti-platelet therapy. Balloon guide and wire were then removed. The skin at the puncture site was then cleaned  with Chlorhexidine. The 8 French sheath was removed and an 30F angioseal was deployed. Flat panel CT was performed. Patient tolerated the procedure well and remained hemodynamically stable throughout. No complications were encountered and no significant blood loss encountered. IMPRESSION: Status post ultrasound guided access right common femoral artery for right-sided cervical and cerebral angiogram, balloon angioplasty of high-grade right ICA stenosis and mechanical thrombectomy of right M1 occlusion, achieving TICI 3 flow. Angio-Seal for hemostasis.  Status post ultrasound-guided access left common femoral artery for placement of arterial monitoring line for anesthesia/critical care. Signed, Dulcy Fanny. Dellia Nims, RPVI [facility] Radiology PLAN: The patient will remain intubated, given the critical care status ICU status Target systolic blood pressure of 120-140 Right hip straight time 6 hours Left common femoral artery monitoring line. Frequent neurovascular checks Repeat neurologic imaging with CT and/MRI at the discretion of neurology team Electronically Signed   By: Corrie Mckusick D.O.   On: 11/04/2020 08:46   ECHOCARDIOGRAM COMPLETE  Result Date: 11/03/2020    ECHOCARDIOGRAM REPORT   Patient Name:   TULLY MCINTURFF Date of Exam: 11/03/2020 Medical Rec #:  [id number]       Height:       72.0 in Accession #:    [id number]      Weight:       193.1 lb Date of Birth:  12-18-1955       BSA:          2.099 m Patient Age:    16 years        BP:           141/86 mmHg Patient Gender: M               HR:           74 bpm. Exam Location:  Inpatient Procedure: 2D Echo, Cardiac Doppler, Color Doppler and Intracardiac            Opacification Agent Indications:    Acute myocardial infarction  History:        Patient has prior history of Echocardiogram examinations, most                 recent 06/07/2019. CHF, COPD, Arrythmias:Atrial Fibrillation;                 Risk Factors:Current Smoker, Dyslipidemia, Hypertension and                 Sleep Apnea.  Sonographer:    Clayton Lefort RDCS (AE) Referring Phys: [id number] [place]  1. The mid to distal septum/anterior segments are akinetic. The entire apex is akinetic. The myocardium appears thin. Suspect these findings represent a large wrap around LAD infarction (troponin >24,000). No LV thrombus on contrast imaging. There is severe LV dysfunction, EF 20-25%. Left ventricular ejection fraction, by estimation, is 20 to 25%. The left ventricle has severely decreased  function. The left ventricle demonstrates regional wall motion abnormalities (see scoring diagram/findings for description). There is mild left ventricular hypertrophy. Left ventricular diastolic parameters are consistent with Grade I diastolic dysfunction (impaired relaxation).  2. Right ventricular systolic function is normal. The right ventricular size is normal. Tricuspid regurgitation signal is inadequate for assessing PA pressure.  3. The mitral valve is grossly normal. Trivial mitral valve regurgitation. No evidence of mitral stenosis.  4. The aortic valve is tricuspid. There is moderate calcification of the aortic valve. Aortic valve regurgitation is not visualized. Mild to moderate aortic valve  sclerosis/calcification is present, without any evidence of aortic stenosis.  5. The inferior vena cava is normal in size with <50% respiratory variability, suggesting right atrial pressure of 8 mmHg. Comparison(s): Changes from prior study are noted. LVEF now severely reduced 20-25%. WMA suggest LAD infarction. Conclusion(s)/Recommendation(s): Findings consistent with ischemic cardiomyopathy. FINDINGS  Left Ventricle: The mid to distal septum/anterior segments are akinetic. The entire apex is akinetic. The myocardium appears thin. Suspect these findings represent a large wrap around LAD infarction (troponin >24,000). No LV thrombus on contrast imaging. There is severe LV dysfunction, EF 20-25%. Left ventricular ejection fraction, by estimation, is 20 to 25%. The left ventricle has severely decreased function. The left ventricle demonstrates regional wall motion abnormalities. Definity contrast  agent was given IV to delineate the left ventricular endocardial borders. The left ventricular internal cavity size was normal in size. There is mild left ventricular hypertrophy. Left ventricular diastolic parameters are consistent with Grade I diastolic dysfunction (impaired relaxation).  LV Wall Scoring: The mid and distal  anterior wall, mid and distal anterior septum, entire apex, and mid inferoseptal segment are akinetic. Right Ventricle: The right ventricular size is normal. No increase in right ventricular wall thickness. Right ventricular systolic function is normal. Tricuspid regurgitation signal is inadequate for assessing PA pressure. Left Atrium: Left atrial size was normal in size. Right Atrium: Right atrial size was normal in size. Pericardium: There is no evidence of pericardial effusion. Mitral Valve: The mitral valve is grossly normal. Mild mitral annular calcification. Trivial mitral valve regurgitation. No evidence of mitral valve stenosis. MV peak gradient, 5.6 mmHg. The mean mitral valve gradient is 3.0 mmHg. Tricuspid Valve: The tricuspid valve is grossly normal. Tricuspid valve regurgitation is trivial. No evidence of tricuspid stenosis. Aortic Valve: The aortic valve is tricuspid. There is moderate calcification of the aortic valve. Aortic valve regurgitation is not visualized. Mild to moderate aortic valve sclerosis/calcification is present, without any evidence of aortic stenosis. Aortic valve mean gradient measures 4.0 mmHg. Aortic valve peak gradient measures 7.6 mmHg. Aortic valve area, by VTI measures 1.80 cm. Pulmonic Valve: The pulmonic valve was grossly normal. Pulmonic valve regurgitation is trivial. No evidence of pulmonic stenosis. Aorta: The aortic root is normal in size and structure. Venous: The inferior vena cava is normal in size with less than 50% respiratory variability, suggesting right atrial pressure of 8 mmHg. IAS/Shunts: The atrial septum is grossly normal.  LEFT VENTRICLE PLAX 2D LVIDd:         4.40 cm   Diastology LVIDs:         3.10 cm   LV e' medial:    6.53 cm/s LV PW:         1.20 cm   LV E/e' medial:  12.5 LV IVS:        1.20 cm   LV e' lateral:   6.75 cm/s LVOT diam:     2.00 cm   LV E/e' lateral: 12.1 LV SV:         54 LV SV Index:   26 LVOT Area:     3.14 cm                            3D Volume EF:                          3D EF:        42 %  LV EDV:       154 ml                          LV ESV:       89 ml                          LV SV:        64 ml RIGHT VENTRICLE             IVC RV Basal diam:  2.90 cm     IVC diam: 1.90 cm RV S prime:     16.10 cm/s TAPSE (M-mode): 2.3 cm LEFT ATRIUM           Index        RIGHT ATRIUM           Index LA diam:      3.30 cm 1.57 cm/m   RA Area:     19.20 cm LA Vol (A4C): 45.0 ml 21.44 ml/m  RA Volume:   56.10 ml  26.73 ml/m  AORTIC VALVE AV Area (Vmax):    2.05 cm AV Area (Vmean):   1.83 cm AV Area (VTI):     1.80 cm AV Vmax:           138.00 cm/s AV Vmean:          98.600 cm/s AV VTI:            0.298 m AV Peak Grad:      7.6 mmHg AV Mean Grad:      4.0 mmHg LVOT Vmax:         90.10 cm/s LVOT Vmean:        57.500 cm/s LVOT VTI:          0.171 m LVOT/AV VTI ratio: 0.57  AORTA Ao Root diam: 3.50 cm Ao Asc diam:  2.90 cm MITRAL VALVE MV Area (PHT): 3.66 cm     SHUNTS MV Area VTI:   1.70 cm     Systemic VTI:  0.17 m MV Peak grad:  5.6 mmHg     Systemic Diam: 2.00 cm MV Mean grad:  3.0 mmHg MV Vmax:       1.18 m/s MV Vmean:      75.5 cm/s MV Decel Time: 207 msec MV E velocity: 81.50 cm/s MV A velocity: 128.00 cm/s MV E/A ratio:  0.64 Eleonore Chiquito MD Electronically signed by Eleonore Chiquito MD Signature Date/Time: 11/03/2020/4:49:02 PM    Final     PHYSICAL EXAM  Temp:  [98.1 F (36.7 C)-99 F (37.2 C)] 98.5 F (36.9 C) (10/17 1200) Pulse Rate:  [57-101] 64 (10/17 1100) Resp:  [12-22] 19 (10/17 1100) BP: (109-152)/(47-97) 136/75 (10/17 1100) SpO2:  [89 %-99 %] 96 % (10/17 1100) Arterial Line BP: (106-181)/(45-112) 111/45 (10/17 0800)  General - Well nourished, well developed, not in acute distress  Ophthalmologic - fundi not visualized due to noncooperation.  Cardiovascular - Regular rate and rhythm.  Neuro - awake, alert, eyes open, orientated to age, place, time. No aphasia, mild dysarthria, following all  simple commands. Able to name and repeat. No gaze palsy, tracking bilaterally, visual field full, PERRL. Slight left nasolabial fold flattening. Tongue midline. RUE 5/5, LUE 4+/5 with slight drift, finger grip 5/5 bilaterally. RLE 4/5 proximal and 5/5 distal, LLE proximal 4/5 and distal 4/5. Sensation symmetrical bilaterally, b/l FTN intact grossly, gait not tested.  ASSESSMENT/PLAN Christopher Lang is a 65 y.o. male with PMH significant for glaucoma, COPD, smoker, HLD, OSA, who presents with CP, nausea, dry heaving, and sudden onset left sided weakness. Found to have anterior STEMI and right MCA occlusion.   Stroke:  right MCA infarct due to right M1 occlusion s/p TNK and IR with TICI3 revascularization, embolic secondary to acute STEMI CT head right M1 hyperdense sign  CTA head & neck right M1 occlusion, severe b/l ICA origin stenosis, 60% left subclavian origin stenosis.  Cerebral angio s/p TICI3 of right M1  MRI Patchy acute cerebral infarcts on the right more than left with right-sided petechial hemorrhage. MRA right MCA patent now. No detected flow in the intracranial left ICA but well compensated by the left posterior communicating artery. Chronic critical stenosis with underfilling of the left ICA bulb. CUS pending 2D Echo EF 20-25%. The mid to distal septum/anterior segments are akinetic. The entire apex is akinetic. The myocardium appears thin. Suspect these findings represent a large wrap around LAD infarction (troponin >24,000). No LV thrombus on contrast imaging. LDL 77 HgbA1c 5.0 VTE prophylaxis - SCDs aspirin 81 mg daily and pletal 100 bid prior to admission, now on ASA 81 and plavix DAPT Therapy recommendations:  pending Disposition:  pending  STEMI Cardiomyopathy  Anteriorlateral MI / STEMI on EKG Trop > 24,000 2D Echo EF 20-25%. The mid to distal septum/anterior segments are akinetic. The entire apex is akinetic. The myocardium appears thin. Suspect these findings  represent a large wrap around LAD infarction (troponin >24,000). No LV thrombus on contrast imaging. Cardiology on board, appreciate help On DAPT  On metoprolol 25mg  bid.  Carotid stenosis CTA head and neck - evere b/l ICA origin stenosis MRA No detected flow in the intracranial left ICA but well compensated by the left posterior communicating artery. Chronic critical stenosis with underfilling of the left ICA bulb. CUS pending On DAPT now  Hypertension Hypotension  Home meds:  cardizem Initial hypotension -> hypertension  Off Neo or cleviprex On metoprolol 25 bid Stable now Long-term BP goal normotensive  Hyperlipidemia Home meds:  lipitor 10 LDL 77, goal < 70 Increase lipitor to 40  Continue statin at discharge  Tobacco abuse Current smoker Smoking cessation counseling will be provided  Other Stroke Risk Factors Advanced Age >/= 60  Obstructive sleep apnea, on CPAP at home Congestive heart failure  Other Active Problems Leukocytosis WBC 17.3->12.3 COPD  Hospital day # 1  This patient is critically ill due to STEMI, stroke with LVO s/p TNK and IR, b/l carotid stenosis, cardiomyopathy and at significant risk of neurological worsening, death form heart failure, cardiac arrest, recurrent stroke, hemorrhagic conversion, seizure. This patient's care requires constant monitoring of vital signs, hemodynamics, respiratory and cardiac monitoring, review of multiple databases, neurological assessment, discussion with family, other specialists and medical decision making of high complexity. I spent 45 minutes of neurocritical care time in the care of this patient. I discussed with Dr. Lynetta Mare and Dr. Irish Lack.   Rosalin Hawking, MD PhD Stroke Neurology 11/04/2020 12:21 PM   To contact Stroke Continuity provider, please refer to [email or web address] After hours, contact General Neurology

## 2020-11-04 NOTE — Evaluation (Signed)
Clinical/Bedside Swallow Evaluation Patient Details  Name: Christopher Lang MRN: [id number] Date of Birth: [birth year]  Today's Date: 11/04/2020 Time: SLP Start Time (ACUTE ONLY): 3546 SLP Stop Time (ACUTE ONLY): 5681 SLP Time Calculation (min) (ACUTE ONLY): 21 min  Past Medical History:  Past Medical History:  Diagnosis Date   Acute CVA (cerebrovascular accident) ([place]) 11/03/2020   Acute left-sided weakness 11/03/2020   Acute ST elevation myocardial infarction (STEMI) ([place]) 11/03/2020   Cataract    Removed Dec 2017/Jan 2018   COPD (chronic obstructive pulmonary disease) ([place])    Essential hypertension    H. pylori infection 11/2016   With documented eradication via biopsy Feb 2019   History of atrial fibrillation    November 2017   Hyperlipidemia    Sleep apnea    Did not tolerate CPAP   Tubulovillous adenoma of rectum 04/2010   Past Surgical History:  Past Surgical History:  Procedure Laterality Date   CATARACT EXTRACTION W/PHACO Right 01/16/2016   Procedure: CATARACT EXTRACTION PHACO AND INTRAOCULAR LENS PLACEMENT RIGHT EYE CDE= 6.97;  Surgeon: Tonny Branch, MD;  Location: [facility];  Service: Ophthalmology;  Laterality: Right;  right   CATARACT EXTRACTION W/PHACO Left 01/30/2016   Procedure: CATARACT EXTRACTION PHACO AND INTRAOCULAR LENS PLACEMENT LEFT EYE CDE 3.13;  Surgeon: Tonny Branch, MD;  Location: [facility];  Service: Ophthalmology;  Laterality: Left;  left   COLONOSCOPY  2012   COLONOSCOPY N/A 12/04/2016   three 4-6 mm tubular adenomas, moderate size external and internal hemorrhoids, redundant left colon, 3 year surveillance   ESOPHAGOGASTRODUODENOSCOPY N/A 12/04/2016   Dr. Oneida Alar: normal esophagus, small hiatal hernia, chronic gastritis with H.pylori, peptic duodenitis, atypical lymphoid proliferation. Surveillance EGD Feb 2019 without abnormal cells, negative H.pylori   ESOPHAGOGASTRODUODENOSCOPY N/A 03/15/2017   mild gastritis, no atypical cells, negative H.pylori    EXCISION, SKI  08/2016   R THIGH   IR CT HEAD LTD  11/03/2020   IR PERCUTANEOUS ART THROMBECTOMY/INFUSION INTRACRANIAL INC DIAG ANGIO  11/03/2020   IR PTA INTRACRANIAL  11/04/2020   IR US GUIDE VASC ACCESS LEFT  11/03/2020   IR US GUIDE VASC ACCESS RIGHT  11/03/2020   RADIOLOGY WITH ANESTHESIA N/A 11/03/2020   Procedure: RADIOLOGY WITH ANESTHESIA;  Surgeon: Radiologist, Medication, MD;  Location: [facility];  Service: Radiology;  Laterality: N/A;   HPI:  65 year old man brought in by EMS complaining of chest pain and noted to have left-sided weakness.   Head CT showed dense right M1 segment, remote cortical infarcts on left .  CT angiogram confirmed large vessel occlusion of right M1 segment with severe bilateral ICA origin stenosis. Angiogram showed high-grade stenosis of right ICA bifurcation, treated by PTA, mechanical thrombectomy of right M1 performed. Intubated for procedure.    Assessment / Plan / Recommendation  Clinical Impression  Pt seen for swallow assessment with wife present who denies prior difficultly swallowing. Pt brushed gums and donned his dentures with residual fixodent. Wife stated family will bring denture cream and will clean dentures thoroughly. Oral-motor exam revealed mild left weakness and decreased ROM resulting in minimal left sulci stasis that pt removed with cues. There were no instances concerning for aspiration. He had one delayed cough that pt/wife reports is baseline, especially in the morningd due to COPD. Recommend regular texture, thin liquids and no further ST needed for swallow. SLP Visit Diagnosis: Dysphagia, unspecified (R13.10)    Aspiration Risk  Mild aspiration risk    Diet Recommendation Regular;Thin liquid   Liquid Administration via:  Cup;Straw Medication Administration: Whole meds with liquid (when able to sit upright; whole in puree until then) Supervision: Staff to assist with self feeding (if needed) Compensations: Slow rate;Lingual sweep for  clearance of pocketing;Small sips/bites Postural Changes: Seated upright at 90 degrees    Other  Recommendations Oral Care Recommendations: Oral care BID    Recommendations for follow up therapy are one component of a multi-disciplinary discharge planning process, led by the attending physician.  Recommendations may be updated based on patient status, additional functional criteria and insurance authorization.  Follow up Recommendations None      Frequency and Duration            Prognosis        Swallow Study   General Date of Onset: 11/03/20 HPI: 65 year old man brought in by EMS complaining of chest pain and noted to have left-sided weakness.   Head CT showed dense right M1 segment, remote cortical infarcts on left .  CT angiogram confirmed large vessel occlusion of right M1 segment with severe bilateral ICA origin stenosis. Angiogram showed high-grade stenosis of right ICA bifurcation, treated by PTA, mechanical thrombectomy of right M1 performed. Intubated for procedure. Type of Study: Bedside Swallow Evaluation Previous Swallow Assessment:  (none) Diet Prior to this Study: NPO Temperature Spikes Noted: No Respiratory Status: Nasal cannula History of Recent Intubation: Yes Length of Intubations (days):  (during procedure only) Date extubated: 11/03/20 Behavior/Cognition: Alert;Cooperative;Pleasant mood Oral Cavity Assessment: Within Functional Limits Oral Care Completed by SLP: Yes Oral Cavity - Dentition: Edentulous;Dentures, top;Dentures, bottom Vision: Functional for self-feeding Self-Feeding Abilities: Able to feed self Patient Positioning: Other (comment) (at 20 degrees with reverse [place] d/t femoral cath) Baseline Vocal Quality: Normal Volitional Cough: Strong Volitional Swallow: Able to elicit    Oral/Motor/Sensory Function Overall Oral Motor/Sensory Function: Mild impairment Facial ROM: Reduced left Facial Symmetry: Abnormal symmetry left Facial  Strength: Reduced left Lingual Symmetry: Within Functional Limits Lingual Strength: Reduced Mandible: Within Functional Limits   Ice Chips Ice chips: Not tested   Thin Liquid Thin Liquid: Within functional limits Presentation: Cup;Straw    Nectar Thick Nectar Thick Liquid: Not tested   Honey Thick Honey Thick Liquid: Not tested   Puree     Solid     Solid: Impaired Oral Phase Impairments: Reduced lingual movement/coordination Oral Phase Functional Implications: Left lateral sulci pocketing (minimal) Pharyngeal Phase Impairments:  (none)      Melania Kirks, Orbie Pyo 11/04/2020,12:57 PM

## 2020-11-04 NOTE — Progress Notes (Signed)
PT Cancellation Note  Patient Details Name: Christopher Lang MRN: [id number] DOB: 1956/01/02   Cancelled Treatment:    Reason Eval/Treat Not Completed: Medical issues which prohibited therapy; checked on pt and technician in room removing femoral sheath and will need 4 hour bedrest.  Will attempt another day.   Reginia Naas 11/04/2020, 1:58 PM Magda Kiel, PT Acute Rehabilitation Services Pager:[phone] [phone] 11/04/2020

## 2020-11-04 NOTE — Progress Notes (Signed)
Carotid duplex bilateral study completed.   Please see CV Proc for preliminary results.   Rockie Schnoor, RDMS, RVT  

## 2020-11-04 NOTE — Progress Notes (Signed)
ANTICOAGULATION CONSULT NOTE - Initial Consult  Pharmacy Consult for heparin Indication: chest pain/ACS  No Known Allergies  Patient Measurements: Height: 6' (182.9 cm) Weight: 87.6 kg (193 lb 2 oz) IBW/kg (Calculated) : 77.6 Heparin Dosing Weight: 87.6  Vital Signs: Temp: 97.5 F (36.4 C) (10/17 1200) Temp Source: Oral (10/17 1200) BP: 143/87 (10/17 1330) Pulse Rate: 84 (10/17 1330)  Labs: Recent Labs    11/03/20 0603 11/03/20 0622 11/03/20 1016 11/03/20 1423 11/03/20 2227 11/04/20 0614  HGB 16.8 17.3* 16.3  --   --  13.6  HCT 47.1 51.0 48.0  --   --  39.3  PLT 210  --   --   --   --  145*  APTT 39*  --   --   --   --   --   LABPROT 13.6  --   --   --   --   --   INR 1.0  --   --   --   --   --   CREATININE 0.93 0.70  --   --   --  0.89  TROPONINIHS  --   --   --  >24,000* >24,000*  --     Estimated Creatinine Clearance: 88.4 mL/min (by C-G formula based on SCr of 0.89 mg/dL).   Medical History: Past Medical History:  Diagnosis Date   Acute CVA (cerebrovascular accident) ([place]) 11/03/2020   Acute left-sided weakness 11/03/2020   Acute ST elevation myocardial infarction (STEMI) ([place]) 11/03/2020   Cataract    Removed Dec 2017/Jan 2018   COPD (chronic obstructive pulmonary disease) ([place])    Essential hypertension    H. pylori infection 11/2016   With documented eradication via biopsy Feb 2019   History of atrial fibrillation    November 2017   Hyperlipidemia    Sleep apnea    Did not tolerate CPAP   Tubulovillous adenoma of rectum 04/2010    Medications:  Medications Prior to Admission  Medication Sig Dispense Refill Last Dose   aspirin EC 81 MG tablet Take 81 mg by mouth daily.   11/02/2020   atorvastatin (LIPITOR) 10 MG tablet Take 1 tablet (10 mg total) by mouth daily at 6 PM. 90 tablet 3 11/02/2020   cholecalciferol (VITAMIN D) 1000 units tablet Take 1,000 Units by mouth daily.   11/02/2020   cilostazol (PLETAL) 100 MG tablet TAKE 1 TABLET BY MOUTH  TWICE A DAY (Patient taking differently: Take 100 mg by mouth 2 (two) times daily.) 180 tablet 1 11/02/2020   diltiazem (CARDIZEM CD) 240 MG 24 hr capsule TAKE 1 CAPSULE BY MOUTH EVERY DAY (Patient taking differently: Take 240 mg by mouth daily.) 90 capsule 1 11/02/2020   fish oil-omega-3 fatty acids 1000 MG capsule Take 1,000 mg by mouth daily.     11/02/2020   pantoprazole (PROTONIX) 40 MG tablet Take 1 tablet (40 mg total) by mouth daily before breakfast. (Patient not taking: No sig reported) 90 tablet 3 Not Taking    Assessment: 44 YOM who presented with a R MCA stroke who was found to have a STEMI. Pharmacy consulted to start IV heparin for ACS/STEMI. H/H wnl. Plt low. SCr wnl   Goal of Therapy:  Heparin level 0.3-0.5 units/ml Monitor platelets by anticoagulation protocol: Yes   Plan:  -Heparin 1150 units/hr. No bolus -F/u 6 hr HL -Monitor daily HL, CBC and s/s of bleeding   Albertina Parr, PharmD., BCPS, BCCCP Clinical Pharmacist Please refer to [facility] for unit-specific pharmacist

## 2020-11-04 NOTE — Progress Notes (Signed)
PT Cancellation Note  Patient Details Name: DEARIS DANIS MRN: [id number] DOB: 08/22/1955   Cancelled Treatment:    Reason Eval/Treat Not Completed: Active bedrest order   Reginia Naas 11/04/2020, 8:37 AM Magda Kiel, PT Acute Rehabilitation Services HOYWV:[phone] Office:[phone] 11/04/2020

## 2020-11-04 NOTE — Consult Note (Signed)
NAME:  Christopher Lang, MRN:  [id number], DOB:  [birth year], LOS: 1 ADMISSION DATE:  11/03/2020, CONSULTATION DATE:  11/04/2020  REFERRING MD:  Erlinda Hong, stroke , CHIEF COMPLAINT: Acute left-sided weakness and chest pain  History of Present Illness:  65 year old man brought in by EMS complaining of chest pain and noted to have left-sided weakness.  EMS gave him aspirin, arrived as a code stroke EKG on arrival to ED showed anterior STEMI V2 to V6 ST elevation.  He was noted to have left facial droop and neglect Head CT showed dense right M1 segment, remote cortical infarcts on left .  CT angiogram confirmed large vessel occlusion of right M1 segment with severe bilateral ICA origin stenosis and 60% left subclavian stenosis. Seen by cardiology, recommended medical management of anterior STEMI in view of acute stroke.  Given TN K , taken to neuro IR. Angiogram showed high-grade stenosis of right ICA bifurcation, treated by PTA, mechanical thrombectomy of right M1 performed Neo-Synephrine needed during procedure, transferred to ICU intubated with sheath in place and sedated on propofol and on Cleviprex for hypertension  Pertinent  Medical History  Mild aortic stenosis Paroxysmal atrial fibrillation Hypertension OSA COPD  Significant Hospital Events: Including procedures, antibiotic start and stop dates in addition to other pertinent events   10/16-received tPA for right ICA bifurcation stenosis followed by M1 thrombectomy with TICI 3 flow Echocardiogram shows evidence of large LAD territory infarct with anterior wall motion abnormality and EF of 25%.  Interim History / Subjective:   Extubated.  Denies chest pain or shortness of breath.  Failed swallowing evaluation yesterday  Objective   Blood pressure 136/75, pulse 64, temperature 98.5 F (36.9 C), temperature source Axillary, resp. rate 19, height 6' (1.829 m), weight 87.6 kg, SpO2 96 %.        Intake/Output Summary (Last 24 hours) at  11/04/2020 1104 Last data filed at 11/04/2020 0700 Gross per 24 hour  Intake 1469.77 ml  Output 750 ml  Net 719.77 ml   Filed Weights   11/03/20 0600  Weight: 87.6 kg    Examination: General: Elderly man, lying comfortably in bed HENT: Pupils 3 mm bilaterally equally reactive to light, no JVD, no pallor or icterus Lungs: Bilateral breath sounds clear, no accessory muscle use Cardiovascular: S1-S2 regular, no murmur Abdomen: Soft, nontender Extremities: No deformity, left femoral arterial line, distal pulses weak but able to Doppler Neuro: Awake alert follows commands speech appears somewhat dysarthric.  No focal neurological deficits.  Normal coordination.  Normal sensation.   EKG reviewed -ST elevation anterolateral leads V2 to V6, I and aVL with T wave inversions  Resolved Hospital Problem list     Assessment & Plan:  Critically ill due to concomitant acute coronary syndrome/STEMI with acute right MCA CVA status post thrombectomy, requiring titration of Cleviprex to keep systolic blood pressure less than 140 Ischemic cardiomyopathy with ejection fraction 25% Cerebrovascular disease Paroxysmal atrial fibrillation Obstructive sleep apnea COPD.  Plan:  -Discontinue arterial line, progressive ambulation. -Reassess swallowing today and place core track if necessary to obtain enteral access -Initiate secondary prevention starting with statin and blood pressure control. -Wean Cleviprex to off. -Will need anticoagulation for atrial fibrillation and large anterior MI as soon as deemed safe.  Best Practice (right click and "Reselect all SmartList Selections" daily)   Diet/type: NPO w/ meds via tube DVT prophylaxis: LMWH GI prophylaxis: PPI Lines: Arterial Line Foley:  Yes, and it is still needed Code Status:  full code Last date  of multidisciplinary goals of care discussion [NA]  Labs   CBC: Recent Labs  Lab 11/03/20 0603 11/03/20 0622 11/03/20 1016 11/04/20 0614   WBC 15.1*  --   --  12.3*  NEUTROABS 13.8*  --   --   --   HGB 16.8 17.3* 16.3 13.6  HCT 47.1 51.0 48.0 39.3  MCV 93.8  --   --  97.3  PLT 210  --   --  145*     Basic Metabolic Panel: Recent Labs  Lab 11/03/20 0603 11/03/20 0622 11/03/20 1016 11/04/20 0614  NA 133* 136 136 135  K 3.8 3.8 4.3 3.8  CL 98 98  --  105  CO2 25  --   --  22  GLUCOSE 194* 201*  --  121*  BUN 11 13  --  11  CREATININE 0.93 0.70  --  0.89  CALCIUM 10.2  --   --  9.5    GFR: Estimated Creatinine Clearance: 88.4 mL/min (by C-G formula based on SCr of 0.89 mg/dL). Recent Labs  Lab 11/03/20 0603 11/04/20 0614  WBC 15.1* 12.3*     Liver Function Tests: Recent Labs  Lab 11/03/20 0603  AST 83*  ALT 30  ALKPHOS 131*  BILITOT 1.8*  PROT 7.5  ALBUMIN 4.0    No results for input(s): LIPASE, AMYLASE in the last 168 hours. No results for input(s): AMMONIA in the last 168 hours.  ABG    Component Value Date/Time   PHART 7.258 (L) 11/03/2020 1016   PCO2ART 60.2 (H) 11/03/2020 1016   PO2ART 88 11/03/2020 1016   HCO3 27.1 11/03/2020 1016   TCO2 29 11/03/2020 1016   ACIDBASEDEF 2.0 11/03/2020 1016   O2SAT 95.0 11/03/2020 1016      Coagulation Profile: Recent Labs  Lab 11/03/20 0603  INR 1.0     Cardiac Enzymes: No results for input(s): CKTOTAL, CKMB, CKMBINDEX, TROPONINI in the last 168 hours.  HbA1C: Hgb A1c MFr Bld  Date/Time Value Ref Range Status  11/03/2020 11:37 AM 4.9 4.8 - 5.6 % Final    Comment:    (NOTE) Pre diabetes:          5.7%-6.4%  Diabetes:              >6.4%  Glycemic control for   <7.0% adults with diabetes   08/15/2019 10:02 AM 5.0 <5.7 % of total Hgb Final    Comment:    For the purpose of screening for the presence of diabetes: . <5.7%       Consistent with the absence of diabetes 5.7-6.4%    Consistent with increased risk for diabetes             (prediabetes) > or =6.5%  Consistent with diabetes . This assay result is consistent with a  decreased risk of diabetes. . Currently, no consensus exists regarding use of hemoglobin A1c for diagnosis of diabetes in children. . According to American Diabetes Association (ADA) guidelines, hemoglobin A1c <7.0% represents optimal control in non-pregnant diabetic patients. Different metrics may apply to specific patient populations.  Standards of Medical Care in Diabetes(ADA). .     CBG: Recent Labs  Lab 11/03/20 1613 11/03/20 2001 11/03/20 2341 11/04/20 0338 11/04/20 0753  GLUCAP 109* 102* 113* 120* 105*    CRITICAL CARE Performed by: Kipp Brood   Total critical care time: 35 minutes  Critical care time was exclusive of separately billable procedures and treating other patients.  Critical care was necessary  to treat or prevent imminent or life-threatening deterioration.  Critical care was time spent personally by me on the following activities: development of treatment plan with patient and/or surrogate as well as nursing, discussions with consultants, evaluation of patient's response to treatment, examination of patient, obtaining history from patient or surrogate, ordering and performing treatments and interventions, ordering and review of laboratory studies, ordering and review of radiographic studies, pulse oximetry, re-evaluation of patient's condition and participation in multidisciplinary rounds.  Kipp Brood, MD [facility] ICU Physician [place]  Pager: [phone] Mobile: [phone] After hours: [phone].   11/04/2020

## 2020-11-04 NOTE — Evaluation (Signed)
Speech Language Pathology Evaluation Patient Details Name: Christopher Lang MRN: [id number] DOB: Dec 15, 1955 Today's Date: 11/04/2020 Time: 0946-1000 SLP Time Calculation (min) (ACUTE ONLY): 14 min  Problem List:  Patient Active Problem List   Diagnosis Date Noted   Acute ST elevation myocardial infarction (STEMI) ([place]) 11/03/2020   Acute CVA (cerebrovascular accident) ([place]) 11/03/2020   Acute left-sided weakness 11/03/2020   Stroke ([place]) 11/03/2020   Acute ischemic right MCA stroke ([place]) 11/03/2020   Acute respiratory failure ([place])    Rectal itching 04/27/2017   Abnormal findings on examination of gastrointestinal tract    Gastritis due to Helicobacter species 13/24/4010   Anemia    Normocytic anemia 10/28/2016   Atherosclerosis of aorta ([place]) 09/29/2016   Tubular adenoma of colon 09/03/2016   Smokes with greater than 30 pack year history 09/03/2016   Atrial fibrillation ([place]) 12/02/2015   Primary hypertension 12/02/2015   Hyperlipidemia 12/02/2015   COPD (chronic obstructive pulmonary disease) ([place]) 12/02/2015   Tobacco abuse 12/02/2015   Past Medical History:  Past Medical History:  Diagnosis Date   Acute CVA (cerebrovascular accident) ([place]) 11/03/2020   Acute left-sided weakness 11/03/2020   Acute ST elevation myocardial infarction (STEMI) ([place]) 11/03/2020   Cataract    Removed Dec 2017/Jan 2018   COPD (chronic obstructive pulmonary disease) ([place])    Essential hypertension    H. pylori infection 11/2016   With documented eradication via biopsy Feb 2019   History of atrial fibrillation    November 2017   Hyperlipidemia    Sleep apnea    Did not tolerate CPAP   Tubulovillous adenoma of rectum 04/2010   Past Surgical History:  Past Surgical History:  Procedure Laterality Date   CATARACT EXTRACTION W/PHACO Right 01/16/2016   Procedure: CATARACT EXTRACTION PHACO AND INTRAOCULAR LENS PLACEMENT RIGHT EYE CDE= 6.97;  Surgeon: Tonny Branch, MD;  Location: [facility];  Service:  Ophthalmology;  Laterality: Right;  right   CATARACT EXTRACTION W/PHACO Left 01/30/2016   Procedure: CATARACT EXTRACTION PHACO AND INTRAOCULAR LENS PLACEMENT LEFT EYE CDE 3.13;  Surgeon: Tonny Branch, MD;  Location: [facility];  Service: Ophthalmology;  Laterality: Left;  left   COLONOSCOPY  2012   COLONOSCOPY N/A 12/04/2016   three 4-6 mm tubular adenomas, moderate size external and internal hemorrhoids, redundant left colon, 3 year surveillance   ESOPHAGOGASTRODUODENOSCOPY N/A 12/04/2016   Dr. Oneida Alar: normal esophagus, small hiatal hernia, chronic gastritis with H.pylori, peptic duodenitis, atypical lymphoid proliferation. Surveillance EGD Feb 2019 without abnormal cells, negative H.pylori   ESOPHAGOGASTRODUODENOSCOPY N/A 03/15/2017   mild gastritis, no atypical cells, negative H.pylori   EXCISION, SKI  08/2016   R THIGH   IR CT HEAD LTD  11/03/2020   IR PERCUTANEOUS ART THROMBECTOMY/INFUSION INTRACRANIAL INC DIAG ANGIO  11/03/2020   IR PTA INTRACRANIAL  11/04/2020   IR US GUIDE VASC ACCESS LEFT  11/03/2020   IR US GUIDE VASC ACCESS RIGHT  11/03/2020   RADIOLOGY WITH ANESTHESIA N/A 11/03/2020   Procedure: RADIOLOGY WITH ANESTHESIA;  Surgeon: Radiologist, Medication, MD;  Location: [facility];  Service: Radiology;  Laterality: N/A;   HPI:  65 year old man brought in by EMS complaining of chest pain and noted to have left-sided weakness.   Head CT showed dense right M1 segment, remote cortical infarcts on left .  CT angiogram confirmed large vessel occlusion of right M1 segment with severe bilateral ICA origin stenosis. Angiogram showed high-grade stenosis of right ICA bifurcation, treated by PTA, mechanical thrombectomy of right M1 performed. Intubated for  procedure.   Assessment / Plan / Recommendation Clinical Impression  Pt seen for speech-language-cognitive assessment. He demonstrates mild dysarthria marked by reduced vocal intensity and imprecise articulation that is different than his norm per pt  and spouse. His score on the SLUMS was a 19/30 with impairments in the areas of, attention for paragraph recall, imprecise "hand" length depicting time and mental math. Strengths included memory, which he recalled 5/5 independently, orientation, divergent naming. 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 Pt would benefit from ST.    SLP Assessment  SLP Recommendation/Assessment: Patient needs continued Speech [place] [facility] Services SLP Visit Diagnosis: Dysarthria and anarthria (R47.1);Cognitive communication deficit (R41.841)    Recommendations for follow up therapy are one component of a multi-disciplinary discharge planning process, led by the attending physician.  Recommendations may be updated based on patient status, additional functional criteria and insurance authorization.    Follow Up Recommendations  Inpatient Rehab    Frequency and Duration min 1 x/week  2 weeks      SLP Evaluation Cognition  Overall Cognitive Status: Impaired/Different from baseline (minimally impaired) Arousal/Alertness: Awake/alert Orientation Level: Oriented X4 Attention: Sustained Sustained Attention: Impaired Memory: Appears intact Awareness: Appears intact Problem Solving: Appears intact Safety/Judgment: Appears intact       Comprehension  Auditory Comprehension Overall Auditory Comprehension: Appears within functional limits for tasks assessed Visual Recognition/Discrimination Discrimination: Not tested Reading Comprehension Reading Status: Not tested    Expression Expression Primary Mode of Expression: Verbal Verbal Expression Overall Verbal Expression: Appears within functional limits for tasks assessed Pragmatics: No impairment Written Expression Dominant Hand: Right Written Expression: Not tested   Oral / Motor  Oral Motor/Sensory Function Overall Oral Motor/Sensory Function: Mild impairment Facial ROM: Reduced left Facial Symmetry: Abnormal symmetry left Facial  Strength: Reduced left Lingual Symmetry: Within Functional Limits Lingual Strength: Reduced Mandible: Within Functional Limits Motor Speech Overall Motor Speech: Impaired Respiration: Within functional limits Phonation: Low vocal intensity Resonance: Within functional limits Articulation: Impaired Level of Impairment: Conversation Intelligibility: Intelligibility reduced Word: 75-100% accurate Phrase: 75-100% accurate Sentence: 75-100% accurate Conversation: 50-74% accurate Motor Planning: Witnin functional limits   GO                    Houston Siren 11/04/2020, 3:33 PM

## 2020-11-05 ENCOUNTER — Other Ambulatory Visit (HOSPITAL_COMMUNITY): Payer: Self-pay

## 2020-11-05 DIAGNOSIS — I2102 ST elevation (STEMI) myocardial infarction involving left anterior descending coronary artery: Secondary | ICD-10-CM | POA: Diagnosis not present

## 2020-11-05 DIAGNOSIS — I639 Cerebral infarction, unspecified: Secondary | ICD-10-CM

## 2020-11-05 DIAGNOSIS — I5021 Acute systolic (congestive) heart failure: Secondary | ICD-10-CM

## 2020-11-05 DIAGNOSIS — Z9282 Status post administration of tPA (rtPA) in a different facility within the last 24 hours prior to admission to current facility: Secondary | ICD-10-CM

## 2020-11-05 DIAGNOSIS — I10 Essential (primary) hypertension: Secondary | ICD-10-CM | POA: Diagnosis not present

## 2020-11-05 LAB — GLUCOSE, CAPILLARY
Glucose-Capillary: 109 mg/dL — ABNORMAL HIGH (ref 70–99)
Glucose-Capillary: 118 mg/dL — ABNORMAL HIGH (ref 70–99)
Glucose-Capillary: 135 mg/dL — ABNORMAL HIGH (ref 70–99)
Glucose-Capillary: 148 mg/dL — ABNORMAL HIGH (ref 70–99)
Glucose-Capillary: 92 mg/dL (ref 70–99)
Glucose-Capillary: 98 mg/dL (ref 70–99)

## 2020-11-05 LAB — CBC
HCT: 38 % — ABNORMAL LOW (ref 39.0–52.0)
Hemoglobin: 13 g/dL (ref 13.0–17.0)
MCH: 33.7 pg (ref 26.0–34.0)
MCHC: 34.2 g/dL (ref 30.0–36.0)
MCV: 98.4 fL (ref 80.0–100.0)
Platelets: 117 10*3/uL — ABNORMAL LOW (ref 150–400)
RBC: 3.86 MIL/uL — ABNORMAL LOW (ref 4.22–5.81)
RDW: 12.4 % (ref 11.5–15.5)
WBC: 9.6 10*3/uL (ref 4.0–10.5)
nRBC: 0 % (ref 0.0–0.2)

## 2020-11-05 LAB — BASIC METABOLIC PANEL
Anion gap: 8 (ref 5–15)
BUN: 15 mg/dL (ref 8–23)
CO2: 23 mmol/L (ref 22–32)
Calcium: 9.4 mg/dL (ref 8.9–10.3)
Chloride: 104 mmol/L (ref 98–111)
Creatinine, Ser: 0.83 mg/dL (ref 0.61–1.24)
GFR, Estimated: >60 mL/min (ref >60)
Glucose, Bld: 101 mg/dL — ABNORMAL HIGH (ref 70–99)
Potassium: 3.6 mmol/L (ref 3.5–5.1)
Sodium: 135 mmol/L (ref 135–145)

## 2020-11-05 LAB — HEPARIN LEVEL (UNFRACTIONATED): Heparin Unfractionated: 0.35 IU/mL (ref 0.30–0.70)

## 2020-11-05 MED ORDER — NICOTINE 14 MG/24HR TD PT24
14.0000 mg | MEDICATED_PATCH | Freq: Every day | TRANSDERMAL | Status: DC
  Administered 2020-11-07 – 2020-11-08 (×2): 14 mg via TRANSDERMAL
  Filled 2020-11-05 (×4): qty 1

## 2020-11-05 MED ORDER — VARENICLINE TARTRATE 0.5 MG PO TABS
0.5000 mg | ORAL_TABLET | Freq: Every day | ORAL | Status: AC
  Administered 2020-11-05 – 2020-11-07 (×2): 0.5 mg via ORAL
  Filled 2020-11-05 (×3): qty 1

## 2020-11-05 MED ORDER — SPIRONOLACTONE 12.5 MG HALF TABLET
12.5000 mg | ORAL_TABLET | Freq: Every day | ORAL | Status: DC
  Administered 2020-11-05 – 2020-11-08 (×4): 12.5 mg via ORAL
  Filled 2020-11-05 (×4): qty 1

## 2020-11-05 MED ORDER — VARENICLINE TARTRATE 0.5 MG PO TABS
0.5000 mg | ORAL_TABLET | Freq: Two times a day (BID) | ORAL | Status: DC
  Administered 2020-11-08: 0.5 mg via ORAL
  Filled 2020-11-05 (×2): qty 1

## 2020-11-05 MED ORDER — SACUBITRIL-VALSARTAN 24-26 MG PO TABS
1.0000 | ORAL_TABLET | Freq: Two times a day (BID) | ORAL | Status: DC
  Administered 2020-11-05 – 2020-11-08 (×7): 1 via ORAL
  Filled 2020-11-05 (×9): qty 1

## 2020-11-05 MED ORDER — METOPROLOL TARTRATE 12.5 MG HALF TABLET
12.5000 mg | ORAL_TABLET | Freq: Two times a day (BID) | ORAL | Status: DC
  Administered 2020-11-05 – 2020-11-08 (×6): 12.5 mg via ORAL
  Filled 2020-11-05 (×5): qty 1

## 2020-11-05 NOTE — Progress Notes (Addendum)
Progress Note  Patient Name: Christopher Lang Date of Encounter: 11/05/2020  [facility] Cardiologist: Rozann Lesches, MD   Subjective   Sitting up in the chair this morning. Walked some with PT this morning.   Inpatient Medications    Scheduled Meds:  atorvastatin  40 mg Oral q1800   chlorhexidine  15 mL Mouth Rinse BID   Chlorhexidine Gluconate Cloth  6 each Topical Daily   cholecalciferol  1,000 Units Oral Daily   clopidogrel  75 mg Oral Daily   insulin aspart  0-15 Units Subcutaneous Q4H   labetalol  20 mg Intravenous Once   mouth rinse  15 mL Mouth Rinse q12n4p   metoprolol tartrate  25 mg Oral BID   sacubitril-valsartan  1 tablet Oral BID   sodium chloride flush  3 mL Intravenous Once   varenicline  0.5 mg Oral Daily   Followed by   Derrill Memo ON 11/08/2020] varenicline  0.5 mg Oral BID   Continuous Infusions:  clevidipine Stopped (11/04/20 1255)   heparin 1,150 Units/hr (11/05/20 0700)   PRN Meds: acetaminophen **OR** acetaminophen (TYLENOL) oral liquid 160 mg/5 mL **OR** acetaminophen, senna-docusate   Vital Signs    Vitals:   11/05/20 0500 11/05/20 0600 11/05/20 0700 11/05/20 0800  BP: 122/68 (!) 152/88 (!) 147/61   Pulse: (!) 51 76 (!) 48   Resp: 16 18 18    Temp:    98.9 F (37.2 C)  TempSrc:    Oral  SpO2: 94% 92% 93%   Weight:      Height:        Intake/Output Summary (Last 24 hours) at 11/05/2020 0920 Last data filed at 11/05/2020 0700 Gross per 24 hour  Intake 467.78 ml  Output 400 ml  Net 67.78 ml   Last 3 Weights 11/03/2020 12/12/2019 08/15/2019  Weight (lbs) 193 lb 2 oz 207 lb 199 lb  Weight (kg) 87.6 kg 93.895 kg 90.266 kg      Telemetry    SR-SB, rates 40-70 - Personally Reviewed  ECG    No new tracing this morning  Physical Exam   GEN: No acute distress.   Neck: No JVD Cardiac: RRR, no murmurs, rubs, or gallops.  Respiratory: Clear to auscultation bilaterally. GI: Soft, nontender, non-distended  MS: No edema; No  deformity. Neuro:  Nonfocal, speech is dysarthric at times Psych: Normal affect   Labs    High Sensitivity Troponin:   Recent Labs  Lab 11/03/20 1423 11/03/20 2227  TROPONINIHS >24,000* >24,000*     Chemistry Recent Labs  Lab 11/03/20 0603 11/03/20 0622 11/03/20 1016 11/04/20 0614 11/05/20 0405  NA 133* 136 136 135 135  K 3.8 3.8 4.3 3.8 3.6  CL 98 98  --  105 104  CO2 25  --   --  22 23  GLUCOSE 194* 201*  --  121* 101*  BUN 11 13  --  11 15  CREATININE 0.93 0.70  --  0.89 0.83  CALCIUM 10.2  --   --  9.5 9.4  PROT 7.5  --   --   --   --   ALBUMIN 4.0  --   --   --   --   AST 83*  --   --   --   --   ALT 30  --   --   --   --   ALKPHOS 131*  --   --   --   --   BILITOT 1.8*  --   --   --   --  GFRNONAA >60  --   --  >60 >60  ANIONGAP 10  --   --  8 8    Lipids  Recent Labs  Lab 11/04/20 0614  CHOL 159  TRIG 187*  HDL 31*  LDLCALC 91  CHOLHDL 5.1    Hematology Recent Labs  Lab 11/03/20 0603 11/03/20 0622 11/03/20 1016 11/04/20 0614 11/05/20 0405  WBC 15.1*  --   --  12.3* 9.6  RBC 5.02  --   --  4.04* 3.86*  HGB 16.8   < > 16.3 13.6 13.0  HCT 47.1   < > 48.0 39.3 38.0*  MCV 93.8  --   --  97.3 98.4  MCH 33.5  --   --  33.7 33.7  MCHC 35.7  --   --  34.6 34.2  RDW 12.1  --   --  12.6 12.4  PLT 210  --   --  145* 117*   < > = values in this interval not displayed.   Thyroid No results for input(s): TSH, FREET4 in the last 168 hours.  BNPNo results for input(s): BNP, PROBNP in the last 168 hours.  DDimer No results for input(s): DDIMER in the last 168 hours.   Radiology    MR ANGIO HEAD WO CONTRAST  Result Date: 11/04/2020 CLINICAL DATA:  Stroke follow-up EXAM: MRI HEAD WITHOUT CONTRAST MRA HEAD WITHOUT CONTRAST TECHNIQUE: Multiplanar, multi-echo pulse sequences of the brain and surrounding structures were acquired without intravenous contrast. Angiographic images of the Circle of Willis were acquired using MRA technique without intravenous  contrast. COMPARISON:  No pertinent prior exam. FINDINGS: MRI HEAD FINDINGS Brain: Patchy acute cortical infarct along the right MCA and border zone territory but also seen to a lesser extent in the white matter and striatum on the right. Less extensive acute infarcts in the lateral left frontal lobe, MCA territory. Pre-existing cortical infarcts asymmetric to the left. Petechial hemorrhage on the right and hemosiderin staining on the left. No hydrocephalus, collection, or shift Vascular: Absent left ICA flow void Skull and upper cervical spine: Normal marrow signal Sinuses/Orbits: Negative MRA HEAD FINDINGS Anterior circulation: Faint flow related signal in the left cervical ICA but none seen at the skull base or intracranially until ramification by the left posterior communicating artery. The right M1 and M2 branches are widely patent. No new embolus. Posterior circulation: Dominant right vertebral artery. The distal basilar divides at the level of the superior cerebellar arteries. No branch occlusion or aneurysm Anatomic variants: None significant IMPRESSION: 1. Patchy acute cerebral infarcts on the right more than left with right-sided petechial hemorrhage. 2. No residual or recurrent right MCA thrombus. 3. No detected flow in the intracranial left ICA but well compensated by the left posterior communicating artery. Chronic critical stenosis with underfilling of the left ICA bulb. Electronically Signed   By: Jorje Guild M.D.   On: 11/04/2020 08:09   MR BRAIN WO CONTRAST  Result Date: 11/04/2020 CLINICAL DATA:  Stroke follow-up EXAM: MRI HEAD WITHOUT CONTRAST MRA HEAD WITHOUT CONTRAST TECHNIQUE: Multiplanar, multi-echo pulse sequences of the brain and surrounding structures were acquired without intravenous contrast. Angiographic images of the Circle of Willis were acquired using MRA technique without intravenous contrast. COMPARISON:  No pertinent prior exam. FINDINGS: MRI HEAD FINDINGS Brain: Patchy  acute cortical infarct along the right MCA and border zone territory but also seen to a lesser extent in the white matter and striatum on the right. Less extensive acute infarcts in the lateral  left frontal lobe, MCA territory. Pre-existing cortical infarcts asymmetric to the left. Petechial hemorrhage on the right and hemosiderin staining on the left. No hydrocephalus, collection, or shift Vascular: Absent left ICA flow void Skull and upper cervical spine: Normal marrow signal Sinuses/Orbits: Negative MRA HEAD FINDINGS Anterior circulation: Faint flow related signal in the left cervical ICA but none seen at the skull base or intracranially until ramification by the left posterior communicating artery. The right M1 and M2 branches are widely patent. No new embolus. Posterior circulation: Dominant right vertebral artery. The distal basilar divides at the level of the superior cerebellar arteries. No branch occlusion or aneurysm Anatomic variants: None significant IMPRESSION: 1. Patchy acute cerebral infarcts on the right more than left with right-sided petechial hemorrhage. 2. No residual or recurrent right MCA thrombus. 3. No detected flow in the intracranial left ICA but well compensated by the left posterior communicating artery. Chronic critical stenosis with underfilling of the left ICA bulb. Electronically Signed   By: Jorje Guild M.D.   On: 11/04/2020 08:09   IR PTA Intracranial  Result Date: 11/04/2020 INDICATION: 65 year old male presents with acute right M1 occlusion, emergent large vessel occlusion, for mechanical thrombectomy EXAM: ULTRASOUND-GUIDED ACCESS RIGHT COMMON FEMORAL ARTERY ULTRASOUND-GUIDED ACCESS LEFT COMMON FEMORAL ARTERY RIGHT CERVICAL AND CEREBRAL ANGIOGRAM BALLOON ANGIOPLASTY CRITICAL STENOSIS RIGHT ICA RIGHT M1 MECHANICAL THROMBECTOMY ANGIO-SEAL FOR HEMOSTASIS. COMPARISON:  CT IMAGING SAME DAY MEDICATIONS: Preoperative T NK ANESTHESIA/SEDATION: The anesthesia team was present to  provide general endotracheal tube anesthesia and for patient monitoring during the procedure. Intubation was performed in biplane/neuro interventional suite. Left radial arterial line was performed by the anesthesia team. Interventional neuro radiology nursing staff was also present. CONTRAST:  70 cc omni 350 FLUOROSCOPY TIME:  Fluoroscopy Time: 17 minutes 48 seconds (1029 mGy). COMPLICATIONS: None TECHNIQUE: Informed written consent was obtained from the patient's family after a thorough discussion of the procedural risks, benefits and alternatives. Specific risks discussed include: Bleeding, infection, contrast reaction, kidney injury/failure, need for further procedure/surgery, arterial injury or dissection, embolization to new territory, intracranial hemorrhage (10-15% risk), neurologic deterioration, cardiopulmonary collapse, death. All questions were addressed. Maximal Sterile Barrier Technique was utilized including during the procedure including caps, mask, sterile gowns, sterile gloves, sterile drape, hand hygiene and skin antiseptic. A timeout was performed prior to the initiation of the procedure. The anesthesia team was present to provide general endotracheal tube anesthesia and for patient monitoring during the procedure. Interventional neuro radiology nursing staff was also present. FINDINGS: Initial Findings: Right common carotid artery:  Normal course caliber and contour. Right external carotid artery: Patent with antegrade flow. Atherosclerotic plaque extends into the proximal ECA with estimated 50 percent narrowing at the origin. Right internal carotid artery: Advanced atherosclerotic changes at the right carotid bifurcation, with at least 50 percent circumferential calcified plaque in the proximal right ICA, and extension of plaque into the proximal right external carotid artery. By NASCET criteria, the initial stenosis measures at least 81 percent . After balloon angioplasty, stenosis measures  less than 50 percent. Right MCA: Since the prior CT, there has been migration of the thrombus into the inferior division, with a superior division frontal branch filling. There is some contamination on the initial angiogram by the left-sided MCA branches given the significant right to left cross fill of the patent anterior communicating artery. Right ACA: A 1 segment patent. A 2 segment perfuses the right territory. Significant cross fill to the left. Completion Findings: Right MCA: TICI 3: Complete perfusion of  the territory Significant cross fill persists from the right to the left. Less than 50 percent stenosis at the cervical ICA origin. PROCEDURE: The anesthesia team was present to provide general endotracheal tube anesthesia and for patient monitoring during the procedure. Intubation was performed in negative pressure [place] in neuro IR holding. Interventional neuro radiology nursing staff was also present. Ultrasound survey of the left inguinal region was performed with images stored and sent to PACs, confirming patency of the vessel. A micropuncture needle was used access the left common femoral artery under ultrasound. With excellent arterial blood flow returned, and an .018 micro wire was passed through the needle, observed enter the abdominal aorta under fluoroscopy. The needle was removed, and a micropuncture sheath was placed over the wire. The inner dilator and wire were removed, and an 035 Bentson wire was advanced under fluoroscopy into the abdominal aorta. The sheath was removed and a standard 4 [place] vascular sheath was placed. The dilator was removed and the sheath was flushed. Ultrasound survey of the right inguinal region was performed with images stored and sent to PACs. Patency of the right common femoral artery confirmed. 11 blade scalpel was used to make a small incision. Blunt dissection was performed with US guidance. A micropuncture needle was used access the right common femoral artery  under ultrasound. With excellent arterial blood flow returned, an .018 micro wire was passed through the needle, observed to enter the abdominal aorta under fluoroscopy. The needle was removed, and a micropuncture sheath was placed over the wire. The inner dilator and wire were removed, and an 035 wire was advanced under fluoroscopy into the abdominal aorta. The sheath was removed and a 25cm 53F straight vascular sheath was placed. The dilator was removed and the sheath was flushed. Sheath was attached to pressurized and heparinized saline bag for constant forward flow. JB 1 catheter was advanced with a Glidewire to the aortic arch. The Glidewire was removed and a double flush was performed. JB 1 was then used to select the innominate artery and the right common carotid artery origin. Catheter was advanced to the distal common carotid artery and the wire was removed. Angiogram was performed. Once we assessed the significant stenosis in the proximal right ICA, we elected to proceed 1st with balloon angioplasty. Exchange length roadrunner was advanced into the external carotid artery branches and the JB 1 was removed. A coaxial system was then advanced over the 035 wire. This included a 95cm 087 "[place]" balloon guide with coaxial 100cm JB 1 diagnostic catheter. This was advanced to the distal common carotid artery. Wire and catheter were then removed. Double flush of the balloon guide was performed. Angiogram was performed for roadmap technique. Using the roadmap technique, A synchro soft wire and rapid transit catheter were used to cross the irregular stenotic plaque of the right ICA. Catheter wire were advanced into the distal cervical ICA and the wire was removed. A floppy transcend exchange length wire was then placed through the catheter and the catheter was removed. We then proceeded with balloon angioplasty of the cervical ICA segment with a 5 mm x 30 mm Viatrac rapid exchange system. Ten atmospheric pressure  was observed. As the balloon was slowly deflated, the balloon guide was advanced over the balloon and wire combination to the distal cervical segment. The balloon and wire were then removed from the system and double flush was performed at the balloon guide. Formal angiogram was performed. Road map function was used once the occluded vessel  was identified. Copious back flush was performed and the balloon catheter was attached to heparinized and pressurized saline bag for forward flow. A second coaxial system was then advanced through the balloon catheter, which included the selected intermediate catheter, microcatheter, and microwire. In this scenario, the set up included a 132cm CAT-5 intermediate catheter, a Trevo Provue18 microcatheter, and 014 synchro soft wire. This system was advanced through the balloon guide catheter under the road-map function, with adequate back-flush at the rotating hemostatic valve at that back end of the balloon guide. Microcatheter and the intermediate catheter system were advanced through the terminal ICA and MCA to the level of the occlusion. The micro wire was then carefully advanced through the occluded segment. Microcatheter was then manipulated through the occluded segment and the wire was removed with saline drip at the hub. Intermediate catheter was advanced into the proximal M1 segment. Blood was then aspirated through the hub of the microcatheter, and a gentle contrast injection was performed confirming intraluminal position. A rotating hemostatic valve was then attached to the back end of the microcatheter, and a pressurized and heparinized saline bag was attached to the catheter. 4 x 40 solitaire device was then selected. Back flush was achieved at the rotating hemostatic valve, and then the device was gently advanced through the microcatheter to the distal end. The retriever was then unsheathed by withdrawing the microcatheter under fluoroscopy. Once the retriever was  completely unsheathed, the microcatheter was carefully stripped from the delivery device. Control angiogram was performed from the intermediate catheter. A 3 minute time interval was observed. The intermediate catheter was then attached to the proprietary suction engine and the catheter was slowly advanced over the solitaire until we observed stasis of flow within the intermediate catheter. The balloon at the balloon guide catheter was then inflated under fluoroscopy for proximal flow arrest. Constant aspiration using the proprietary engine was then performed at the intermediate catheter, as the retriever was gently and slowly withdrawn with fluoroscopic observation. Once the retriever was "corked" within the tip of the intermediate catheter, both were removed from the system. Free aspiration was confirmed at the hub of the balloon guide catheter, with free blood return confirmed. The balloon was then deflated, and a control angiogram was performed. Restoration of flow was confirmed. Balloon guide was withdrawn to the common carotid artery segment after placing the transcend wire to the distal cervical segment, to maintain wire cross the ICA origin. Angiogram of the cervical ICA was performed. Given the improved diameter on the conclusion, we elected not to deployed stent at this time, as that would require dual anti-platelet therapy. Balloon guide and wire were then removed. The skin at the puncture site was then cleaned with Chlorhexidine. The 8 French sheath was removed and an 25F angioseal was deployed. Flat panel CT was performed. Patient tolerated the procedure well and remained hemodynamically stable throughout. No complications were encountered and no significant blood loss encountered. IMPRESSION: Status post ultrasound guided access right common femoral artery for right-sided cervical and cerebral angiogram, balloon angioplasty of high-grade right ICA stenosis and mechanical thrombectomy of right M1  occlusion, achieving TICI 3 flow. Angio-Seal for hemostasis. Status post ultrasound-guided access left common femoral artery for placement of arterial monitoring line for anesthesia/critical care. Signed, Dulcy Fanny. Dellia Nims, RPVI [facility] PLAN: The patient will remain intubated, given the critical care status ICU status Target systolic blood pressure of 120-140 Right hip straight time 6 hours Left common femoral artery  monitoring line. Frequent neurovascular checks Repeat neurologic imaging with CT and/MRI at the discretion of neurology team Electronically Signed   By: Corrie Mckusick D.O.   On: 11/04/2020 08:46   Portable Chest x-ray  Result Date: 11/03/2020 CLINICAL DATA:  Status post endotracheal tube and OG tube placement. EXAM: PORTABLE CHEST 1 VIEW COMPARISON:  Chest x-ray dated 12/02/2015. FINDINGS: Endotracheal tube appears well positioned with tip above the level of the carina. Enteric tube passes below the diaphragm. Mild central pulmonary vascular congestion. Probable mild atelectasis at the medial aspect of the RIGHT lung base. Coarse interstitial markings throughout the remainder of both lungs. No pleural effusion or pneumothorax is seen. IMPRESSION: 1. Endotracheal and enteric tubes appear well positioned. 2. Central pulmonary vascular congestion and coarse interstitial markings throughout both lungs, most likely interstitial edema superimposed on chronic interstitial lung disease. 3. Probable atelectasis at the RIGHT lung base. Electronically Signed   By: Franki Cabot M.D.   On: 11/03/2020 09:53   ECHOCARDIOGRAM COMPLETE  Result Date: 11/03/2020    ECHOCARDIOGRAM REPORT   Patient Name:   Christopher Lang Date of Exam: 11/03/2020 Medical Rec #:  [id number]       Height:       72.0 in Accession #:    [id number]      Weight:       193.1 lb Date of Birth:  1955-04-16       BSA:          2.099 m Patient Age:    65 years        BP:            141/86 mmHg Patient Gender: M               HR:           74 bpm. Exam Location:  Inpatient Procedure: 2D Echo, Cardiac Doppler, Color Doppler and Intracardiac            Opacification Agent Indications:    Acute myocardial infarction  History:        Patient has prior history of Echocardiogram examinations, most                 recent 06/07/2019. CHF, COPD, Arrythmias:Atrial Fibrillation;                 Risk Factors:Current Smoker, Dyslipidemia, Hypertension and                 Sleep Apnea.  Sonographer:    Clayton Lefort RDCS (AE) Referring Phys: [id number] [place]  1. The mid to distal septum/anterior segments are akinetic. The entire apex is akinetic. The myocardium appears thin. Suspect these findings represent a large wrap around LAD infarction (troponin >24,000). No LV thrombus on contrast imaging. There is severe LV dysfunction, EF 20-25%. Left ventricular ejection fraction, by estimation, is 20 to 25%. The left ventricle has severely decreased function. The left ventricle demonstrates regional wall motion abnormalities (see scoring diagram/findings for description). There is mild left ventricular hypertrophy. Left ventricular diastolic parameters are consistent with Grade I diastolic dysfunction (impaired relaxation).  2. Right ventricular systolic function is normal. The right ventricular size is normal. Tricuspid regurgitation signal is inadequate for assessing PA pressure.  3. The mitral valve is grossly normal. Trivial mitral valve regurgitation. No evidence of mitral stenosis.  4. The aortic valve is tricuspid. There is moderate calcification of the aortic valve. Aortic valve regurgitation is not visualized. Mild to moderate aortic  valve sclerosis/calcification is present, without any evidence of aortic stenosis.  5. The inferior vena cava is normal in size with <50% respiratory variability, suggesting right atrial pressure of 8 mmHg. Comparison(s): Changes from prior study are noted.  LVEF now severely reduced 20-25%. WMA suggest LAD infarction. Conclusion(s)/Recommendation(s): Findings consistent with ischemic cardiomyopathy. FINDINGS  Left Ventricle: The mid to distal septum/anterior segments are akinetic. The entire apex is akinetic. The myocardium appears thin. Suspect these findings represent a large wrap around LAD infarction (troponin >24,000). No LV thrombus on contrast imaging. There is severe LV dysfunction, EF 20-25%. Left ventricular ejection fraction, by estimation, is 20 to 25%. The left ventricle has severely decreased function. The left ventricle demonstrates regional wall motion abnormalities. Definity contrast  agent was given IV to delineate the left ventricular endocardial borders. The left ventricular internal cavity size was normal in size. There is mild left ventricular hypertrophy. Left ventricular diastolic parameters are consistent with Grade I diastolic dysfunction (impaired relaxation).  LV Wall Scoring: The mid and distal anterior wall, mid and distal anterior septum, entire apex, and mid inferoseptal segment are akinetic. Right Ventricle: The right ventricular size is normal. No increase in right ventricular wall thickness. Right ventricular systolic function is normal. Tricuspid regurgitation signal is inadequate for assessing PA pressure. Left Atrium: Left atrial size was normal in size. Right Atrium: Right atrial size was normal in size. Pericardium: There is no evidence of pericardial effusion. Mitral Valve: The mitral valve is grossly normal. Mild mitral annular calcification. Trivial mitral valve regurgitation. No evidence of mitral valve stenosis. MV peak gradient, 5.6 mmHg. The mean mitral valve gradient is 3.0 mmHg. Tricuspid Valve: The tricuspid valve is grossly normal. Tricuspid valve regurgitation is trivial. No evidence of tricuspid stenosis. Aortic Valve: The aortic valve is tricuspid. There is moderate calcification of the aortic valve. Aortic valve  regurgitation is not visualized. Mild to moderate aortic valve sclerosis/calcification is present, without any evidence of aortic stenosis. Aortic valve mean gradient measures 4.0 mmHg. Aortic valve peak gradient measures 7.6 mmHg. Aortic valve area, by VTI measures 1.80 cm. Pulmonic Valve: The pulmonic valve was grossly normal. Pulmonic valve regurgitation is trivial. No evidence of pulmonic stenosis. Aorta: The aortic root is normal in size and structure. Venous: The inferior vena cava is normal in size with less than 50% respiratory variability, suggesting right atrial pressure of 8 mmHg. IAS/Shunts: The atrial septum is grossly normal.  LEFT VENTRICLE PLAX 2D LVIDd:         4.40 cm   Diastology LVIDs:         3.10 cm   LV e' medial:    6.53 cm/s LV PW:         1.20 cm   LV E/e' medial:  12.5 LV IVS:        1.20 cm   LV e' lateral:   6.75 cm/s LVOT diam:     2.00 cm   LV E/e' lateral: 12.1 LV SV:         54 LV SV Index:   26 LVOT Area:     3.14 cm                           3D Volume EF:                          3D EF:        42 %  LV EDV:       154 ml                          LV ESV:       89 ml                          LV SV:        64 ml RIGHT VENTRICLE             IVC RV Basal diam:  2.90 cm     IVC diam: 1.90 cm RV S prime:     16.10 cm/s TAPSE (M-mode): 2.3 cm LEFT ATRIUM           Index        RIGHT ATRIUM           Index LA diam:      3.30 cm 1.57 cm/m   RA Area:     19.20 cm LA Vol (A4C): 45.0 ml 21.44 ml/m  RA Volume:   56.10 ml  26.73 ml/m  AORTIC VALVE AV Area (Vmax):    2.05 cm AV Area (Vmean):   1.83 cm AV Area (VTI):     1.80 cm AV Vmax:           138.00 cm/s AV Vmean:          98.600 cm/s AV VTI:            0.298 m AV Peak Grad:      7.6 mmHg AV Mean Grad:      4.0 mmHg LVOT Vmax:         90.10 cm/s LVOT Vmean:        57.500 cm/s LVOT VTI:          0.171 m LVOT/AV VTI ratio: 0.57  AORTA Ao Root diam: 3.50 cm Ao Asc diam:  2.90 cm MITRAL VALVE MV Area (PHT): 3.66 cm      SHUNTS MV Area VTI:   1.70 cm     Systemic VTI:  0.17 m MV Peak grad:  5.6 mmHg     Systemic Diam: 2.00 cm MV Mean grad:  3.0 mmHg MV Vmax:       1.18 m/s MV Vmean:      75.5 cm/s MV Decel Time: 207 msec MV E velocity: 81.50 cm/s MV A velocity: 128.00 cm/s MV E/A ratio:  0.64 Eleonore Chiquito MD Electronically signed by Eleonore Chiquito MD Signature Date/Time: 11/03/2020/4:49:02 PM    Final    VAS US CAROTID  Result Date: 11/04/2020 Carotid Arterial Duplex Study Patient Name:  Christopher Lang  Date of Exam:   11/04/2020 Medical Rec #: [id number]        Accession #:    [id number] Date of Birth: 03-13-1955        Patient Gender: M Patient Age:   49 years Exam Location:  [facility] Procedure:      VAS US CAROTID Referring Phys: Cornelius Moras XU --------------------------------------------------------------------------------  Indications:       CVA. Limitations        Today's exam was limited due to heavy calcification and the                    resulting shadowing and the patient's inability or                    unwillingness to cooperate.  Comparison Study:  11-03-2020 CTA neck showed bilateral severe ICA origin                    stenosis. Performing Technologist: Darlin Coco RDMS, RVT  Examination Guidelines: A complete evaluation includes B-mode imaging, spectral Doppler, color Doppler, and power Doppler as needed of all accessible portions of each vessel. Bilateral testing is considered an integral part of a complete examination. Limited examinations for reoccurring indications may be performed as noted.  Right Carotid Findings: +----------+--------+--------+--------+----------------------+--------+           PSV cm/sEDV cm/sStenosisPlaque Description    Comments +----------+--------+--------+--------+----------------------+--------+ CCA Prox  109     30                                             +----------+--------+--------+--------+----------------------+--------+ CCA Mid   93      27       <50%    calcific and irregular         +----------+--------+--------+--------+----------------------+--------+ CCA Distal108     32              heterogenous                   +----------+--------+--------+--------+----------------------+--------+ ICA Prox  362     148     80-99%  calcific and irregular         +----------+--------+--------+--------+----------------------+--------+ ICA Mid   348     120                                            +----------+--------+--------+--------+----------------------+--------+ ICA Distal117     38                                             +----------+--------+--------+--------+----------------------+--------+ ECA       243     25      >50%    calcific and irregular         +----------+--------+--------+--------+----------------------+--------+ +----------+--------+-------+----------------+-------------------+           PSV cm/sEDV cmsDescribe        Arm Pressure (mmHG) +----------+--------+-------+----------------+-------------------+ [id number]            Multiphasic, WNL                    +----------+--------+-------+----------------+-------------------+ +---------+--------+--+--------+--+---------+ VertebralPSV cm/s95EDV cm/s35Antegrade +---------+--------+--+--------+--+---------+  Left Carotid Findings: +----------+--------+--------+--------+--------------------+-------------------+           PSV cm/sEDV cm/sStenosisPlaque Description  Comments            +----------+--------+--------+--------+--------------------+-------------------+ CCA Prox  83      0                                                       +----------+--------+--------+--------+--------------------+-------------------+ CCA Mid   73      0       <50%    heterogenous and  smooth                                   +----------+--------+--------+--------+--------------------+-------------------+ CCA Distal31      0                                                       +----------+--------+--------+--------+--------------------+-------------------+ ICA Prox                          calcific and        Unable to visualize                                   irregular           due to                                                                    calcification/shado                                                       wing                +----------+--------+--------+--------+--------------------+-------------------+ ICA Mid   88      26                                  Velocities may                                                            underestimate                                                             degree of stenosis                                                        due to more  proximal                                                                  obstruction.        +----------+--------+--------+--------+--------------------+-------------------+ ICA Distal                                            Unable to evaluate                                                        due to patient                                                            movement/weak                                                             signal              +----------+--------+--------+--------+--------------------+-------------------+ ECA       205     21      >50%    calcific and                                                              irregular                               +----------+--------+--------+--------+--------------------+-------------------+ +----------+--------+--------+----------------+-------------------+           PSV  cm/sEDV cm/sDescribe        Arm Pressure (mmHG) +----------+--------+--------+----------------+-------------------+ [id number]             Multiphasic, WNL                    +----------+--------+--------+----------------+-------------------+ +---------+--------+--+--------+--+---------+ VertebralPSV cm/s54EDV cm/s16Antegrade +---------+--------+--+--------+--+---------+   Summary: Right Carotid: Velocities in the right ICA are consistent with a 80-99%                stenosis. The ECA appears >50% stenosed. Left Carotid: The ECA appears >50% stenosed. Unable to evaluate proximal ICA               secondary to severe calcification/acoustic shadowing. Vertebrals:  Bilateral vertebral arteries demonstrate antegrade flow. Subclavians: Normal flow hemodynamics were seen in bilateral subclavian              arteries. *See table(s) above  for measurements and observations.     Preliminary     Cardiac Studies   Echo: 11/03/20   IMPRESSIONS     1. The mid to distal septum/anterior segments are akinetic. The entire  apex is akinetic. The myocardium appears thin. Suspect these findings  represent a large wrap around LAD infarction (troponin >24,000). No LV  thrombus on contrast imaging. There is  severe LV dysfunction, EF 20-25%. Left ventricular ejection fraction, by  estimation, is 20 to 25%. The left ventricle has severely decreased  function. The left ventricle demonstrates regional wall motion  abnormalities (see scoring diagram/findings for  description). There is mild left ventricular hypertrophy. Left ventricular  diastolic parameters are consistent with Grade I diastolic dysfunction  (impaired relaxation).   2. Right ventricular systolic function is normal. The right ventricular  size is normal. Tricuspid regurgitation signal is inadequate for assessing  PA pressure.   3. The mitral valve is grossly normal. Trivial mitral valve  regurgitation. No evidence of mitral stenosis.    4. The aortic valve is tricuspid. There is moderate calcification of the  aortic valve. Aortic valve regurgitation is not visualized. Mild to  moderate aortic valve sclerosis/calcification is present, without any  evidence of aortic stenosis.   5. The inferior vena cava is normal in size with <50% respiratory  variability, suggesting right atrial pressure of 8 mmHg.   Comparison(s): Changes from prior study are noted. LVEF now severely  reduced 20-25%. WMA suggest LAD infarction.   Conclusion(s)/Recommendation(s): Findings consistent with ischemic  cardiomyopathy.   FINDINGS   Left Ventricle: The mid to distal septum/anterior segments are akinetic.  The entire apex is akinetic. The myocardium appears thin. Suspect these  findings represent a large wrap around LAD infarction (troponin >24,000).  No LV thrombus on contrast  imaging. There is severe LV dysfunction, EF 20-25%. Left ventricular  ejection fraction, by estimation, is 20 to 25%. The left ventricle has  severely decreased function. The left ventricle demonstrates regional wall  motion abnormalities. Definity contrast   agent was given IV to delineate the left ventricular endocardial borders.  The left ventricular internal cavity size was normal in size. There is  mild left ventricular hypertrophy. Left ventricular diastolic parameters  are consistent with Grade I  diastolic dysfunction (impaired relaxation).      LV Wall Scoring:  The mid and distal anterior wall, mid and distal anterior septum, entire  apex, and mid inferoseptal segment are akinetic.   Right Ventricle: The right ventricular size is normal. No increase in  right ventricular wall thickness. Right ventricular systolic function is  normal. Tricuspid regurgitation signal is inadequate for assessing PA  pressure.   Left Atrium: Left atrial size was normal in size.   Right Atrium: Right atrial size was normal in size.   Pericardium: There is no evidence  of pericardial effusion.   Mitral Valve: The mitral valve is grossly normal. Mild mitral annular  calcification. Trivial mitral valve regurgitation. No evidence of mitral  valve stenosis. MV peak gradient, 5.6 mmHg. The mean mitral valve gradient  is 3.0 mmHg.   Tricuspid Valve: The tricuspid valve is grossly normal. Tricuspid valve  regurgitation is trivial. No evidence of tricuspid stenosis.   Aortic Valve: The aortic valve is tricuspid. There is moderate  calcification of the aortic valve. Aortic valve regurgitation is not  visualized. Mild to moderate aortic valve sclerosis/calcification is  present, without any evidence of aortic stenosis.  Aortic valve mean gradient measures 4.0 mmHg. Aortic  valve peak gradient  measures 7.6 mmHg. Aortic valve area, by VTI measures 1.80 cm.   Pulmonic Valve: The pulmonic valve was grossly normal. Pulmonic valve  regurgitation is trivial. No evidence of pulmonic stenosis.   Aorta: The aortic root is normal in size and structure.   Venous: The inferior vena cava is normal in size with less than 50%  respiratory variability, suggesting right atrial pressure of 8 mmHg.   IAS/Shunts: The atrial septum is grossly normal.   Patient Profile     65 y.o. male with a hx of P.afib, HTN, HLD, COPD, OSA who is being seen 11/03/2020 for the evaluation of chest pain, left sided weakness at the request of ER doc- Dr Leonette Monarch.  Assessment & Plan    Acute Stroke: right MCA infarct 2/2 right M1 occlusion. Received TNK and went to IR for revascularization. S/p extubation 10/16 pm.  -- MRI--> patchy acute cerebral infarcts on the right sided petechial hemorrhage, no residual right MCA thrombus.  -- management per neurology, cleared to start plavix and IV heparin -- on statin, BB   Anterior STEMI: EKG on admission with STE in anteroseptal leads, TWI in anterolateral leads. hsTn peaked >24000. Evaluated in the ED and deemed not a candidate for invasive cath with  acute CVA s/p TNK. Plan for medical management. Cleared to start IV heparin and plavix. EKG yesterday with evolving MI. Remains chest pain free.  -- on statin and BB. Will cut metoprolol back to 12.5mg  BID with some episodes of bradycardia into the 40s   HFrEF: Echo showed EF of 20-25% with mid to distal akinesis in distal septum and apex concerning for large wrap around LAD infarction. No LV thrombus noted. G1DD.  -- Started on metoprolol 25mg  BID on admission, Entresto 24/26 BID started yesterday. Will add spiro today. Would like to add SGLT2 prior to discharge as well (check cost).   HTN: improved and weaned from cleviprex.  -- metoprolol reduced to 12.5mg  daily -- Entresto 24/26 BID -- add spiro 12.5mg  daily    HLD: on atoravstatin 10mg  daily, LDL 91 -- further increased to 40mg  daily -- FLP/LFTs in 8 weeks   Tobacco use: cessation education   Hx of paroxysmal Afib: noted in past cardiology notes from Dr. Domenic Polite. Patient reports this was noted in 2017 prior to having cataract surgery. No Afib noted during this admission. CHA2DS2-VASc Score of 5. Now with embolic stroke.  -- Will need DOAC at discharge, cost check per PharmD   For questions or updates, please contact [place] Please consult [email or web address] for contact info under        Signed, Reino Bellis, NP  11/05/2020, 9:20 AM    I have examined the patient and reviewed assessment and plan and discussed with patient.  Agree with above as stated.  Status post anterior MI.  Acute systolic heart failure with low ejection fraction started.  Heart failure meds being titrated.  Now on IV heparin to prevent apical thrombus.  He also has paroxysmal atrial fibrillation.  He will be getting carotid stent tomorrow.  Plavix will also be beneficial from a cardiac standpoint.  Will likely need at least 3 months of anticoagulation to prevent apical thrombus.  He likely will continue anticoagulation going forward given the atrial  fibrillation and history of stroke.  Of note, he did not feel any symptoms with his prior atrial fibrillation.  It occurred in the perioperative setting.  It is possible that he was having silent atrial fibrillation episodes.  We also spoke about smoking cessation.  Medical therapy for hyperlipidemia as well.  Larae Grooms

## 2020-11-05 NOTE — Progress Notes (Addendum)
OT EVALUATION   PT admitted with CVA and Nstemi. Pt currently with functional limitiations due to the deficits listed below (see OT problem list). Pt currently requires RW for transfers due to balance deficits and L visual field changes. Pt with decreased fine motor and gross motor movement L UE. Pt very ataxic pad to pad movement of L digits.  Pt will benefit from skilled OT to increase their independence and safety with adls and balance to allow discharge CIR.  Call me Bill   11/05/20 0800  OT Visit Information  Last OT Received On 11/05/20  Assistance Needed +1  History of Present Illness 65 yo male admitted with CP and noted to have L side weakness. CT dense R M1 segment remote cortical infarcts on L. Angiogram R ICA bifurcation mechanical thrombectomy of R M1 performed 10/16 extubated EKG showed anteriorlateral STEMI  PMH COPD HTn Afib HLD Sleep apnea  Precautions  Precautions Fall  Precaution Comments watch HR  Home Living  Family/patient expects to be discharged to: Private residence  Living Arrangements Spouse/significant other  Available Help at Discharge Family;Friend(s)  Type of [place] to enter  Entrance Stairs-Number of Steps 3  Entrance Stairs-Rails Can reach both  [place] Two level;Able to live on main level with bedroom/bathroom  Medical laboratory scientific officer seat;Grab bars - tub/shower;Hand held shower head;Cane - single point;Walker - 2 wheels;Crutches;BSC;Walker - 4 wheels;Art therapist x2)  Prior Function  Level of Independence Independent  Communication  Communication Other (comment) (slurred speech)  Pain Assessment  Pain Assessment No/denies pain  Cognition  Arousal/Alertness Awake/alert  Behavior During Therapy WFL for tasks assessed/performed  Overall Cognitive Status Impaired/Different from baseline  Area of Impairment  Safety/judgement;Awareness  Safety/Judgement Decreased awareness of deficits;Decreased awareness of safety  Awareness Emergent  Upper Extremity Assessment  Upper Extremity Assessment RUE deficits/detail;LUE deficits/detail  RUE Deficits / Details decreased coordination to hand but normal grasp.  LUE Deficits / Details reports changs in sensory when asked in detail with ADL task but initially denied. Pt with decreased motor planning  LUE Sensation decreased light touch;decreased proprioception  LUE Coordination decreased fine motor;decreased gross motor  Lower Extremity Assessment  Lower Extremity Assessment Defer to PT evaluation  Cervical / Trunk Assessment  Cervical / Trunk Assessment Normal  ADL  Overall ADL's  Needs assistance/impaired  Eating/Feeding Set up;Sitting  Eating/Feeding Details (indicate cue type and reason) provided red foam for meal  Grooming Wash/dry face;Min guard;Standing  Grooming Details (indicate cue type and reason) dropping wash cloth with L hand  Upper Body Bathing Minimal assistance;Sitting  Lower Body Bathing Moderate assistance;Sit to/from Retail buyer Minimal assistance;Ambulation;RW  Toileting- Clothing Manipulation and Hygiene Moderate assistance  Functional mobility during ADLs Minimal assistance;Rolling walker  General ADL Comments pt progressing to bathroom level transfer but noted to have balance deficits. pt requires RW and was indep prior  Vision- History  Baseline Vision/History 1 Wears glasses;3 Glaucoma  Ability to See in Adequate Light  (reading only per wife)  Bed Mobility  General bed mobility comments up in chair  Transfers  Overall transfer level Needs assistance  Equipment used Rolling walker (2 wheeled)  Transfers Sit to/from Stand  Sit to Stand Min guard  General transfer comment requires RW for baalnce  Balance  Overall balance assessment Needs assistance  Sitting-balance support Bilateral upper extremity  supported;Feet supported  Sitting balance-Leahy Scale Good  Standing  balance support No upper extremity supported;During functional activity  Standing balance-Leahy Scale Poor  General Comments  General comments (skin integrity, edema, etc.) HR 10 point increase with activity this session  Exercises  Exercises Other exercises  Other Exercises  Other Exercises educated on fine motor task. given fine motor handout. encouraged to do games on phone for figure movements. provided red foam for utensils  Other Exercises wife present for all education  OT - End of Session  Equipment Utilized During Treatment Gait belt;Rolling walker  Activity Tolerance Patient tolerated treatment well  Patient left in chair;with call bell/phone within reach;with family/visitor present (PT arriving to take over session)  Nurse Communication Mobility status;Precautions  OT Assessment  OT Recommendation/Assessment Patient needs continued OT Services  OT Visit Diagnosis Unsteadiness on feet (R26.81);Muscle weakness (generalized) (M62.81)  OT Problem List Decreased strength;Decreased activity tolerance;Impaired balance (sitting and/or standing);Decreased cognition;Decreased safety awareness;Decreased knowledge of use of DME or AE;Decreased knowledge of precautions  OT Plan  OT Frequency (ACUTE ONLY) Min 2X/week  OT Treatment/Interventions (ACUTE ONLY) Self-care/ADL training;Therapeutic exercise;Neuromuscular education;Energy conservation;DME and/or AE instruction;Manual therapy;Therapeutic activities;Cognitive remediation/compensation;Visual/perceptual remediation/compensation;Patient/family education;Balance training  AM-PAC OT "6 Clicks" Daily Activity Outcome Measure (Version 2)  Help from another person eating meals? 3  Help from another person taking care of personal grooming? 3  Help from another person toileting, which includes using toliet, bedpan, or urinal? 3  Help from another person bathing (including  washing, rinsing, drying)? 3  Help from another person to put on and taking off regular upper body clothing? 3  Help from another person to put on and taking off regular lower body clothing? 3  6 Click Score 18  Progressive Mobility  What is the highest level of mobility based on the progressive mobility assessment? Level 3 (Stands with assist) - Balance while standing  and cannot march in place  Mobility Ambulated with assistance in room  OT Recommendation  Recommendations for Other Services Rehab consult  Follow Up Recommendations CIR  OT Equipment None recommended by OT  Individuals Consulted  Consulted and Agree with Results and Recommendations Patient;Family member/caregiver  Family Member Consulted wife  Acute Rehab OT Goals  Patient Stated Goal to get back to being able to walk without walker  OT Goal Formulation With patient  Time For Goal Achievement 11/19/20  Potential to Achieve Goals Good  OT Time Calculation  OT Start Time (ACUTE ONLY) 0815  OT Stop Time (ACUTE ONLY) 0900  OT Time Calculation (min) 45 min  OT General Charges  $OT Visit 1 Visit  OT Evaluation  $OT Eval Moderate Complexity 1 Mod  OT Treatments  $Self Care/Home Management  23-37 mins  Written Expression  Dominant Hand Right   Brynn, OTR/L  Acute Rehabilitation Services Pager: [phone] Office: [phone] .

## 2020-11-05 NOTE — TOC CAGE-AID Note (Signed)
Transition of Care [facility]) - CAGE-AID Screening   Patient Details  Name: Christopher Lang MRN: [id number] Date of Birth: 08-Apr-1955  [facility] CM/SW Contact:    Dessa Ledee C Tarpley-Carter, [place] Phone Number: 11/05/2020, 10:27 AM   Clinical Narrative: Pt is unable to participate in Cage Aid.  Dalaney Needle Tarpley-Carter, MSW, LCSW-A Pronouns:  She/Her/Hers [facility]itions of Care Clinical Social Worker Direct Number:  [phone] [email or web address]  CAGE-AID Screening: Substance Abuse Screening unable to be completed due to: : Patient unable to participate             Substance Abuse Education Offered: No

## 2020-11-05 NOTE — Progress Notes (Signed)
STROKE TEAM PROGRESS NOTE   INTERVAL HISTORY Wife, RN and PT are at the bedside.  Pt was walking well with PT, and then became tired and bradycardia and had to sit down in chair. His HR now 50s, BP 140s, asymptomatic. Cardiology decreased metoprolol dose. Pt no significant weakness, b/l strength symmetrical now. CUS still showed b/l ICA high grade stenosis, discussed with Dr. Earleen Newport and will likely do right ICA stenting this admission.   Vitals:   11/05/20 0900 11/05/20 0921 11/05/20 0923 11/05/20 0932  BP: (!) 147/80 (!) 141/107 (!) 145/75 (!) 143/74  Pulse: (!) 51 (!) 58 60 (!) 55  Resp: 17 20 20 20   Temp:      TempSrc:      SpO2: 94% 92% 98% 98%  Weight:      Height:       CBC:  Recent Labs  Lab 11/03/20 0603 11/03/20 0622 11/04/20 0614 11/05/20 0405  WBC 15.1*  --  12.3* 9.6  NEUTROABS 13.8*  --   --   --   HGB 16.8   < > 13.6 13.0  HCT 47.1   < > 39.3 38.0*  MCV 93.8  --  97.3 98.4  PLT 210  --  145* 117*   < > = values in this interval not displayed.   Basic Metabolic Panel:  Recent Labs  Lab 11/04/20 0614 11/05/20 0405  NA 135 135  K 3.8 3.6  CL 105 104  CO2 22 23  GLUCOSE 121* 101*  BUN 11 15  CREATININE 0.89 0.83  CALCIUM 9.5 9.4   Lipid Panel:  Recent Labs  Lab 11/04/20 0614  CHOL 159  TRIG 187*  HDL 31*  CHOLHDL 5.1  VLDL 37  LDLCALC 91    HgbA1c:  Recent Labs  Lab 11/03/20 1137  HGBA1C 4.9   Urine Drug Screen: No results for input(s): LABOPIA, COCAINSCRNUR, LABBENZ, AMPHETMU, THCU, LABBARB in the last 168 hours.  Alcohol Level No results for input(s): ETH in the last 168 hours.  IMAGING past 24 hours VAS US CAROTID  Result Date: 11/04/2020 Carotid Arterial Duplex Study Patient Name:  Christopher Lang  Date of Exam:   11/04/2020 Medical Rec #: [id number]        Accession #:    [id number] Date of Birth: February 20, 67 1957        Patient Gender: M Patient Age:   65 years Exam Location:  [facility] Procedure:      VAS US CAROTID Referring  Phys: Cornelius Moras Akshar Starnes --------------------------------------------------------------------------------  Indications:       CVA. Limitations        Today's exam was limited due to heavy calcification and the                    resulting shadowing and the patient's inability or                    unwillingness to cooperate. Comparison Study:  11-03-2020 CTA neck showed bilateral severe ICA origin                    stenosis. Performing Technologist: Darlin Coco RDMS, RVT  Examination Guidelines: A complete evaluation includes B-mode imaging, spectral Doppler, color Doppler, and power Doppler as needed of all accessible portions of each vessel. Bilateral testing is considered an integral part of a complete examination. Limited examinations for reoccurring indications may be performed as noted.  Right Carotid Findings: +----------+--------+--------+--------+----------------------+--------+  PSV cm/sEDV cm/sStenosisPlaque Description    Comments +----------+--------+--------+--------+----------------------+--------+ CCA Prox  109     30                                             +----------+--------+--------+--------+----------------------+--------+ CCA Mid   93      27      <50%    calcific and irregular         +----------+--------+--------+--------+----------------------+--------+ CCA Distal108     32              heterogenous                   +----------+--------+--------+--------+----------------------+--------+ ICA Prox  362     148     80-99%  calcific and irregular         +----------+--------+--------+--------+----------------------+--------+ ICA Mid   348     120                                            +----------+--------+--------+--------+----------------------+--------+ ICA Distal117     38                                             +----------+--------+--------+--------+----------------------+--------+ ECA       243     25      >50%     calcific and irregular         +----------+--------+--------+--------+----------------------+--------+ +----------+--------+-------+----------------+-------------------+           PSV cm/sEDV cmsDescribe        Arm Pressure (mmHG) +----------+--------+-------+----------------+-------------------+ [id number]            Multiphasic, WNL                    +----------+--------+-------+----------------+-------------------+ +---------+--------+--+--------+--+---------+ VertebralPSV cm/s95EDV cm/s35Antegrade +---------+--------+--+--------+--+---------+  Left Carotid Findings: +----------+--------+--------+--------+--------------------+-------------------+           PSV cm/sEDV cm/sStenosisPlaque Description  Comments            +----------+--------+--------+--------+--------------------+-------------------+ CCA Prox  83      0                                                       +----------+--------+--------+--------+--------------------+-------------------+ CCA Mid   73      0       <50%    heterogenous and                                                          smooth                                  +----------+--------+--------+--------+--------------------+-------------------+ CCA Distal31      0                                                       +----------+--------+--------+--------+--------------------+-------------------+  ICA Prox                          calcific and        Unable to visualize                                   irregular           due to                                                                    calcification/shado                                                       wing                +----------+--------+--------+--------+--------------------+-------------------+ ICA Mid   88      26                                  Velocities may                                                             underestimate                                                             degree of stenosis                                                        due to more                                                               proximal                                                                  obstruction.        +----------+--------+--------+--------+--------------------+-------------------+ ICA Distal  Unable to evaluate                                                        due to patient                                                            movement/weak                                                             signal              +----------+--------+--------+--------+--------------------+-------------------+ ECA       205     21      >50%    calcific and                                                              irregular                               +----------+--------+--------+--------+--------------------+-------------------+ +----------+--------+--------+----------------+-------------------+           PSV cm/sEDV cm/sDescribe        Arm Pressure (mmHG) +----------+--------+--------+----------------+-------------------+ [id number]             Multiphasic, WNL                    +----------+--------+--------+----------------+-------------------+ +---------+--------+--+--------+--+---------+ VertebralPSV cm/s54EDV cm/s16Antegrade +---------+--------+--+--------+--+---------+   Summary: Right Carotid: Velocities in the right ICA are consistent with a 80-99%                stenosis. The ECA appears >50% stenosed. Left Carotid: The ECA appears >50% stenosed. Unable to evaluate proximal ICA               secondary to severe calcification/acoustic shadowing. Vertebrals:  Bilateral vertebral arteries demonstrate antegrade flow. Subclavians: Normal flow  hemodynamics were seen in bilateral subclavian              arteries. *See table(s) above for measurements and observations.     Preliminary     PHYSICAL EXAM  Temp:  [97.5 F (36.4 C)-98.9 F (37.2 C)] 98.9 F (37.2 C) (10/18 0800) Pulse Rate:  [48-97] 55 (10/18 0932) Resp:  [14-28] 20 (10/18 0932) BP: (112-152)/(50-107) 143/74 (10/18 0932) SpO2:  [92 %-100 %] 98 % (10/18 0932)  General - Well nourished, well developed, not in acute distress, mild lethargy  Ophthalmologic - fundi not visualized due to noncooperation.  Cardiovascular - Regular rate and rhythm.  Neuro - awake, alert, eyes open, mild lethargy, orientated to age, place, time. No aphasia, mild dysarthria, following all simple commands. Able to name and repeat. No gaze palsy, tracking bilaterally, visual  field full, PERRL. Slight left nasolabial fold flattening. Tongue midline. BUEs and BLEs equal strength symmetrical. Sensation decreased on the left UE and LE, b/l FTN intact grossly, gait not tested.    ASSESSMENT/PLAN GRANT SWAGER is a 65 y.o. male with PMH significant for glaucoma, COPD, smoker, HLD, OSA, who presents with CP, nausea, dry heaving, and sudden onset left sided weakness. Found to have anterior STEMI and right MCA occlusion.   Stroke:  right MCA infarct due to right M1 occlusion s/p TNK and IR with TICI3 revascularization, embolic secondary to acute STEMI CT head right M1 hyperdense sign  CTA head & neck right M1 occlusion, severe b/l ICA origin stenosis with underfilling on the left, mod to severe stenosis b/l ICA siphon, 60% left subclavian origin stenosis.  Cerebral angio s/p TICI3 of right M1  MRI Patchy acute cerebral infarcts on the right more than left with right-sided petechial hemorrhage. MRA right MCA patent now. No detected flow in the intracranial left ICA but well compensated by the left posterior communicating artery. Chronic critical stenosis with underfilling of the left ICA bulb. CUS  right ICA 80-99% stenosis, left ICA hard to measure due to shadowing  2D Echo EF 20-25%. The mid to distal septum/anterior segments are akinetic. The entire apex is akinetic. The myocardium appears thin. Suspect these findings represent a large wrap around LAD infarction (troponin >24,000). No LV thrombus on contrast imaging. LDL 77 HgbA1c 5.0 VTE prophylaxis - SCDs aspirin 81 mg daily and pletal 100 bid prior to admission, now on heparin IV and plavix Therapy recommendations:  CIR Disposition:  pending  STEMI Cardiomyopathy  Anteriorlateral MI / STEMI on EKG Trop > 24,000 2D Echo EF 20-25%. The mid to distal septum/anterior segments are akinetic. The entire apex is akinetic. The myocardium appears thin. Suspect these findings represent a large wrap around LAD infarction (troponin >24,000). No LV thrombus on contrast imaging. Cardiology on board, appreciate help On heparin IV and plavix On metoprolol 25mg  bid->12.5mg  bid due to brady  Carotid stenosis CTA head and neck - evere b/l ICA origin stenosis with underfilling on the left MRA No detected flow in the intracranial left ICA but well compensated by the left posterior communicating artery. Chronic critical stenosis with underfilling of the left ICA bulb. CUS right ICA 80-99% stenosis, left ICA hard to measure due to shadowing On heparin IV and plavix now Discussed with Dr. Earleen Newport, will plan for right ICA stenting this admission  Hypertension Hypotension  Home meds:  cardizem Initial hypotension -> hypertension  Off Neo and cleviprex On metoprolol 25 bid->12.5 bid Stable now at 140s Avoid low BP Long-term BP goal 130-150 given b/l carotid stenosis  Hyperlipidemia Home meds:  lipitor 10 LDL 77, goal < 70 Increase lipitor to 40  Continue statin at discharge  Tobacco abuse Current smoker Smoking cessation counseling will be provided  Other Stroke Risk Factors Advanced Age >/= 42  Obstructive sleep apnea, on CPAP at  home Congestive heart failure  Other Active Problems Leukocytosis WBC 17.3->12.3 COPD  Hospital day # 2  This patient is critically ill due to STEMI, right MCA occlusion s/p TNK and thrombectomy, b/l ICA high grade stenosis, bradycardia and at significant risk of neurological worsening, death form recurrent stroke, heart failure, cardiac arrest, hemorrhagic conversion. This patient's care requires constant monitoring of vital signs, hemodynamics, respiratory and cardiac monitoring, review of multiple databases, neurological assessment, discussion with family, other specialists and medical decision making of high complexity. I spent 45  minutes of neurocritical care time in the care of this patient. I discussed with Dr. Lynetta Mare CCM and Dr. Earleen Newport IR. I had long discussion with wife at bedside, updated pt current condition, treatment plan and potential prognosis, and answered all the questions. She expressed understanding and appreciation.    Rosalin Hawking, MD PhD Stroke Neurology 11/05/2020 10:15 AM   To contact Stroke Continuity provider, please refer to [email or web address] After hours, contact General Neurology

## 2020-11-05 NOTE — Progress Notes (Signed)
[place] for heparin Indication: chest pain/ACS  No Known Allergies  Patient Measurements: Height: 6' (182.9 cm) Weight: 87.6 kg (193 lb 2 oz) IBW/kg (Calculated) : 77.6 Heparin Dosing Weight: 87.6  Vital Signs: Temp: 98.9 F (37.2 C) (10/18 0800) Temp Source: Oral (10/18 0800) BP: 147/61 (10/18 0700) Pulse Rate: 48 (10/18 0700)  Labs: Recent Labs    11/03/20 0603 11/03/20 0622 11/03/20 1016 11/03/20 1423 11/03/20 2227 11/04/20 0614 11/04/20 2217 11/05/20 0405  HGB 16.8 17.3* 16.3  --   --  13.6  --  13.0  HCT 47.1 51.0 48.0  --   --  39.3  --  38.0*  PLT 210  --   --   --   --  145*  --  117*  APTT 39*  --   --   --   --   --   --   --   LABPROT 13.6  --   --   --   --   --   --   --   INR 1.0  --   --   --   --   --   --   --   HEPARINUNFRC  --   --   --   --   --   --  0.36 0.35  CREATININE 0.93 0.70  --   --   --  0.89  --  0.83  TROPONINIHS  --   --   --  >24,000* >24,000*  --   --   --      Estimated Creatinine Clearance: 94.8 mL/min (by C-G formula based on SCr of 0.83 mg/dL).   Assessment: 57 YOM who presented with a R MCA stroke who was found to have a STEMI. Pharmacy consulted to start IV heparin for ACS/STEMI. H/H wnl. Plt downtrending. SCr wnl.  Heparin level remains therapeutic (0.35) on gtt at 1150 units/hr. No bleeding noted.  Goal of Therapy:  Heparin level 0.3-0.5 units/ml Monitor platelets by anticoagulation protocol: Yes   Plan:  Continue heparin 1150 units/hr F/u daily heparin level and CBC  Albertina Parr, PharmD., BCPS, BCCCP Clinical Pharmacist Please refer to [facility] for unit-specific pharmacist

## 2020-11-05 NOTE — Evaluation (Signed)
Physical Therapy Evaluation Patient Details Name: Christopher Lang MRN: [id number] DOB: 11/23/1955 Today's Date: 11/05/2020  History of Present Illness  65 yo male admitted with CP and noted to have L side weakness. CT dense R M1 segment remote cortical infarcts on L. Angiogram R ICA bifurcation mechanical thrombectomy of R M1 performed 10/16 extubated EKG showed anteriorlateral STEMI  PMH COPD HTn Afib HLD Sleep apnea  Clinical Impression  Pt admitted with above diagnosis. Pt from home with wife where he was independent but they reports he has been having balance issues past couple of years. Pt ambulated well with RW in hallway but as he progressed HR dropped from 80's to 50's and pt became less conversant and alert. Returned to chair afterwards and RN present. Pt with R gaze preference and difficulty with obstacle navigation while ambulating. Currently recommend CIR level therapies to return to PLOF.  Pt currently with functional limitations due to the deficits listed below (see PT Problem List). Pt will benefit from skilled PT to increase their independence and safety with mobility to allow discharge to the venue listed below.          Recommendations for follow up therapy are one component of a multi-disciplinary discharge planning process, led by the attending physician.  Recommendations may be updated based on patient status, additional functional criteria and insurance authorization.  Follow Up Recommendations CIR;Supervision/Assistance - 24 hour    Equipment Recommendations  None recommended by PT    Recommendations for Other Services Rehab consult     Precautions / Restrictions Precautions Precautions: Fall Precaution Comments: watch HR (bradycardia) Restrictions Weight Bearing Restrictions: No      Mobility  Bed Mobility               General bed mobility comments: pt in chair upon PT arrival    Transfers Overall transfer level: Needs assistance Equipment used:  Rolling walker (2 wheeled) Transfers: Sit to/from Stand Sit to Stand: Min guard         General transfer comment: vc's for hand placement, RW used as pt felt unsteady. Had some difficulty coordinating stand>sit, needed tactile cues for safety  Ambulation/Gait Ambulation/Gait assistance: Min assist Gait Distance (Feet): 200 Feet Assistive device: Rolling walker (2 wheeled);None Gait Pattern/deviations: Step-through pattern;Shuffle;Decreased stride length;Narrow base of support Gait velocity: decreased Gait velocity interpretation: <1.8 ft/sec, indicate of risk for recurrent falls General Gait Details: pt with narrow BOS and shiffling gait, could modify minimally with cues. Pt headed straight towards wall with RW, could verbalize that it was there but did not redirect until cued. Ambulated last 90' without AD, min A to steady. Brgan ambulation conversing and saying he felt great. HR in 80's. By the end HR was in 50's and pt with decreased attention and verbalization and saying he felt tired. Could still follow commands and was oriented. RN present  Financial trader Rankin (Stroke Patients Only) Modified Rankin (Stroke Patients Only) Pre-Morbid Rankin Score: No significant disability Modified Rankin: Moderately severe disability     Balance Overall balance assessment: Needs assistance Sitting-balance support: Feet supported;No upper extremity supported Sitting balance-Leahy Scale: Good     Standing balance support: No upper extremity supported Standing balance-Leahy Scale: Poor Standing balance comment: able to maintain static stance without UE support, unsteady with dynamic  Pertinent Vitals/Pain Pain Assessment: No/denies pain Faces Pain Scale: No hurt    Home Living Family/patient expects to be discharged to:: Private residence Living Arrangements: Spouse/significant other Available Help at  Discharge: Family;Friend(s) Type of Home: House Home Access: Stairs to enter Entrance Stairs-Rails: Can reach both Entrance Stairs-Number of Steps: 3 Home Layout: Two level;Able to live on main level with bedroom/bathroom Home Equipment: Shower seat;Grab bars - tub/shower;Hand held shower head;Cane - single point;Walker - 2 wheels;Crutches;Bedside commode;Walker - 4 wheels;Adaptive equipment      Prior Function Level of Independence: Independent               Hand Dominance   Dominant Hand: Right    Extremity/Trunk Assessment   Upper Extremity Assessment Upper Extremity Assessment: Defer to OT evaluation RUE Deficits / Details: decreased coordination to hand but normal grasp. LUE Deficits / Details: reports changs in sensory when asked in detail with ADL task but initially denied. Pt with decreased motor planning LUE Sensation: decreased light touch;decreased proprioception LUE Coordination: decreased fine motor;decreased gross motor    Lower Extremity Assessment Lower Extremity Assessment: LLE deficits/detail;Generalized weakness LLE Deficits / Details: strength WFL but reports decreased sensation to lt touch and demonstrated generalized weakness as HR decreased LLE Sensation: decreased light touch;decreased proprioception LLE Coordination: decreased fine motor;decreased gross motor    Cervical / Trunk Assessment Cervical / Trunk Assessment: Normal  Communication   Communication: Other (comment) (slurred speech)  Cognition Arousal/Alertness: Awake/alert;Lethargic (lethargic end of session when brady) Behavior During Therapy: WFL for tasks assessed/performed Overall Cognitive Status: Impaired/Different from baseline Area of Impairment: Safety/judgement;Awareness                         Safety/Judgement: Decreased awareness of deficits;Decreased awareness of safety Awareness: Emergent   General Comments: pt oriented, with distinct R gaze preference, and  inattention to L sided obstacles with mobility      General Comments General comments (skin integrity, edema, etc.): HR remaining in 50's end of session and RN present. BP 141/107, then 145/75. SPO2 remaining in 90's.    Exercises     Assessment/Plan    PT Assessment Patient needs continued PT services  PT Problem List Decreased balance;Decreased strength;Decreased activity tolerance;Decreased mobility;Cardiopulmonary status limiting activity;Decreased knowledge of use of DME;Decreased cognition;Decreased coordination;Impaired sensation       PT Treatment Interventions DME instruction;Gait training;Stair training;Functional mobility training;Therapeutic activities;Therapeutic exercise;Balance training;Neuromuscular re-education;Cognitive remediation;Patient/family education    PT Goals (Current goals can be found in the Care Plan section)  Acute Rehab PT Goals Patient Stated Goal: get better, better balance PT Goal Formulation: With patient/family Time For Goal Achievement: 11/19/20 Potential to Achieve Goals: Good    Frequency Min 4X/week   Barriers to discharge        Co-evaluation               AM-PAC PT "6 Clicks" Mobility  Outcome Measure Help needed turning from your back to your side while in a flat bed without using bedrails?: A Little Help needed moving from lying on your back to sitting on the side of a flat bed without using bedrails?: A Little Help needed moving to and from a bed to a chair (including a wheelchair)?: A Little Help needed standing up from a chair using your arms (e.g., wheelchair or bedside chair)?: A Little Help needed to walk in hospital room?: A Little Help needed climbing 3-5 steps with a railing? : A Lot 6 Click Score:  17    End of Session Equipment Utilized During Treatment: Gait belt Activity Tolerance: Treatment limited secondary to medical complications (Comment) (bradycardia end of session) Patient left: in chair;with call  bell/phone within reach;with family/visitor present;with nursing/sitter in room Nurse Communication: Mobility status;Other (comment) (HR) PT Visit Diagnosis: Unsteadiness on feet (R26.81);Difficulty in walking, not elsewhere classified (R26.2)    Time: 9833-8250 PT Time Calculation (min) (ACUTE ONLY): 36 min   Charges:   PT Evaluation $PT Eval Moderate Complexity: 1 Mod PT Treatments $Gait Training: 8-22 mins        Leighton Roach, PT  Acute Rehab Services  Pager [phone] Office [place] 11/05/2020, 11:56 AM

## 2020-11-05 NOTE — Progress Notes (Signed)
Inpatient Rehab Admissions:  Inpatient Rehab Consult received.  I met with patient and his wife at the bedside for rehabilitation assessment and to discuss goals and expectations of an inpatient rehab admission.  Both acknowledged understanding of goals and expectations. Both interested in pt pursuing CIR.  Will continue to follow.  Signed: Gayland Curry, [place], [place] Admissions Coordinator [phone]

## 2020-11-05 NOTE — H&P (Signed)
Chief Complaint: Patient was seen in consultation today for right ICA stenosis  Referring Physician(s): Code stroke  Supervising Physician: Corrie Mckusick  Patient Status: [facility] - In-pt  History of Present Illness: Christopher Lang is a 65 y.o. male with a past medical history significant for OSA, COPD, tobacco use, HTN, HLD, paroxysmal a.fib who presented to [facility] on 10/16 with sudden onset left sided weakness, chest pain and nausea. He was found to have anterior STEMI and large vessel occlusion of the right M1 segment with severe bilateral ICA origin stenosis and 60% left subclavian stenosis. He was given tNKase by neurology and underwent right M1 mechanical thrombectomy as well as PTA of the high grade symptomatic stenosis of the right ICA bifurcation. He was admitted to neuro ICU and started on heparin gtt. Carotid artery [place] yesterday noted 80-99% stenosis of the previously angioplastied right ICA - patient seen today to discuss intervention.  Patient sitting up in chair, he and his wife report new trouble swallowing thin liquids, he is trying some thicker liquids to see if he also has trouble with that. He reports coughing and they are concerned for aspiration, he has not had a modified swallow yet but has been seen by SLP yesterday. His wife also notes that his left arm is unsteady left arm when holding cups but this has not been noted on the right side. Discussed [place] results and need for stent placement during this admission to prevent further strokes as well as possibly left ICA stenting as an outpatient. Patient and wife are agreeable to this.  Past Medical History:  Diagnosis Date  . Acute CVA (cerebrovascular accident) ([place]) 11/03/2020  . Acute left-sided weakness 11/03/2020  . Acute ST elevation myocardial infarction (STEMI) ([place]) 11/03/2020  . Cataract    Removed Dec 2017/Jan 2018  . COPD (chronic obstructive pulmonary disease) ([place])   . Essential hypertension   . H. pylori  infection 11/2016   With documented eradication via biopsy Feb 2019  . History of atrial fibrillation    November 2017  . Hyperlipidemia   . Sleep apnea    Did not tolerate CPAP  . Tubulovillous adenoma of rectum 04/2010    Past Surgical History:  Procedure Laterality Date  . CATARACT EXTRACTION W/PHACO Right 01/16/2016   Procedure: CATARACT EXTRACTION PHACO AND INTRAOCULAR LENS PLACEMENT RIGHT EYE CDE= 6.97;  Surgeon: Tonny Branch, MD;  Location: [facility];  Service: Ophthalmology;  Laterality: Right;  right  . CATARACT EXTRACTION W/PHACO Left 01/30/2016   Procedure: CATARACT EXTRACTION PHACO AND INTRAOCULAR LENS PLACEMENT LEFT EYE CDE 3.13;  Surgeon: Tonny Branch, MD;  Location: [facility];  Service: Ophthalmology;  Laterality: Left;  left  . COLONOSCOPY  2012  . COLONOSCOPY N/A 12/04/2016   three 4-6 mm tubular adenomas, moderate size external and internal hemorrhoids, redundant left colon, 3 year surveillance  . ESOPHAGOGASTRODUODENOSCOPY N/A 12/04/2016   Dr. Oneida Alar: normal esophagus, small hiatal hernia, chronic gastritis with H.pylori, peptic duodenitis, atypical lymphoid proliferation. Surveillance EGD Feb 2019 without abnormal cells, negative H.pylori  . ESOPHAGOGASTRODUODENOSCOPY N/A 03/15/2017   mild gastritis, no atypical cells, negative H.pylori  . EXCISION, SKI  08/2016   R THIGH  . IR CT HEAD LTD  11/03/2020  . IR PERCUTANEOUS ART THROMBECTOMY/INFUSION INTRACRANIAL INC DIAG ANGIO  11/03/2020  . IR PTA INTRACRANIAL  11/04/2020  . IR US GUIDE VASC ACCESS LEFT  11/03/2020  . IR US GUIDE VASC ACCESS RIGHT  11/03/2020  . RADIOLOGY WITH ANESTHESIA N/A 11/03/2020  Procedure: RADIOLOGY WITH ANESTHESIA;  Surgeon: Radiologist, Medication, MD;  Location: [place];  Service: Radiology;  Laterality: N/A;    Allergies: Patient has no known allergies.  Medications: Prior to Admission medications   Medication Sig Start Date End Date Taking? Authorizing Provider  aspirin EC 81 MG tablet Take 81  mg by mouth daily.   Yes [provider]  atorvastatin (LIPITOR) 10 MG tablet Take 1 tablet (10 mg total) by mouth daily at 6 PM. 03/09/19  Yes Dettinger, Fransisca Kaufmann, MD  cholecalciferol (VITAMIN D) 1000 units tablet Take 1,000 Units by mouth daily.   Yes [provider]  cilostazol (PLETAL) 100 MG tablet TAKE 1 TABLET BY MOUTH TWICE A DAY Patient taking differently: Take 100 mg by mouth 2 (two) times daily. 05/20/20  Yes Verta Ellen., NP  diltiazem (CARDIZEM CD) 240 MG 24 hr capsule TAKE 1 CAPSULE BY MOUTH EVERY DAY Patient taking differently: Take 240 mg by mouth daily. 05/20/20  Yes Satira Sark, MD  fish oil-omega-3 fatty acids 1000 MG capsule Take 1,000 mg by mouth daily.     Yes [provider]  pantoprazole (PROTONIX) 40 MG tablet Take 1 tablet (40 mg total) by mouth daily before breakfast. Patient not taking: No sig reported 03/09/19   Dettinger, Fransisca Kaufmann, MD     Family History  Problem Relation Age of Onset  . CAD Mother   . High blood pressure Mother   . Heart attack Mother   . Leukemia Father   . Early death Father   . Heart attack Sister   . Diabetes Sister   . Cancer Brother   . Deep vein thrombosis Brother   . Hypertension Daughter   . Stroke Sister   . Stroke Sister   . Heart attack Sister   . Deep vein thrombosis Sister   . Hypertension Sister   . Diabetes Sister   . Arthritis Sister   . Arthritis Sister   . Arthritis Sister   . Hypertension Sister   . Heart attack Sister   . Cancer Brother        KIDNEY  . Kidney disease Brother   . Deep vein thrombosis Brother   . Seizures Brother   . Heart attack Brother   . Colon cancer Neg Hx   . Colon polyps Neg Hx     Social History   Socioeconomic History  . Marital status: Married    Spouse name: [name]  . Number of children: 2  . Years of education: GED  . Highest education level: Not on file  Occupational History  . 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 Smoking status: Every Day    Packs/day: 1.00    Years: 45.00    Pack years: 45.00    Types: Cigarettes    Last attempt to quit: 02/28/2017    Years since quitting: 3.6  . Smokeless tobacco: Never  . Tobacco comments:    started back - pack per day  Vaping Use  . Vaping Use: Never used  Substance and Sexual Activity  . Alcohol use: Yes    Alcohol/week: 0.0 standard drinks    Comment: occas  . Drug use: No  . Sexual activity: Yes    Birth control/protection: Post-menopausal  Other Topics Concern  . Not on file  Social History Narrative   Lives with wife [name]   Leisure - couch potato   Social Determinants of  Health   Financial Resource Strain: Not on file  Food Insecurity: Not on file  Transportation Needs: Not on file  Physical Activity: Not on file  Stress: Not on file  Social Connections: Not on file     Review of Systems: A 12 point ROS discussed and pertinent positives are indicated in the HPI above.  All other systems are negative.  Review of Systems  Constitutional:  Negative for chills and fever.  Respiratory:  Positive for cough (when swallowing liquids). Negative for shortness of breath.   Cardiovascular:  Negative for chest pain.  Gastrointestinal:  Negative for abdominal pain, nausea and vomiting.  Musculoskeletal:  Negative for back pain.  Neurological:  Negative for headaches.   Vital Signs: BP 130/73   Pulse (!) 50   Temp 98.9 F (37.2 C) (Oral)   Resp 18   Ht 6' (1.829 m)   Wt 193 lb 2 oz (87.6 kg)   SpO2 96%   BMI 26.19 kg/m   Physical Exam Vitals and nursing note reviewed.  Constitutional:      General: He is not in acute distress. HENT:     Head: Normocephalic.  Cardiovascular:     Rate and Rhythm: Regular rhythm. Bradycardia present.  Pulmonary:     Effort: Pulmonary effort is normal.     Breath sounds: Normal breath sounds.  Abdominal:     Palpations: Abdomen is soft.     Tenderness: There is no abdominal  tenderness.  Skin:    General: Skin is warm and dry.  Neurological:     Mental Status: He is alert.     MD Evaluation Airway: WNL Heart: WNL Abdomen: WNL ASA  Classification: 3 Mallampati/Airway Score: Two   Imaging: MR ANGIO HEAD WO CONTRAST  Result Date: 11/04/2020 CLINICAL DATA:  Stroke follow-up EXAM: MRI HEAD WITHOUT CONTRAST MRA HEAD WITHOUT CONTRAST TECHNIQUE: Multiplanar, multi-echo pulse sequences of the brain and surrounding structures were acquired without intravenous contrast. Angiographic images of the Circle of Willis were acquired using MRA technique without intravenous contrast. COMPARISON:  No pertinent prior exam. FINDINGS: MRI HEAD FINDINGS Brain: Patchy acute cortical infarct along the right MCA and border zone territory but also seen to a lesser extent in the white matter and striatum on the right. Less extensive acute infarcts in the lateral left frontal lobe, MCA territory. Pre-existing cortical infarcts asymmetric to the left. Petechial hemorrhage on the right and hemosiderin staining on the left. No hydrocephalus, collection, or shift Vascular: Absent left ICA flow void Skull and upper cervical spine: Normal marrow signal Sinuses/Orbits: Negative MRA HEAD FINDINGS Anterior circulation: Faint flow related signal in the left cervical ICA but none seen at the skull base or intracranially until ramification by the left posterior communicating artery. The right M1 and M2 branches are widely patent. No new embolus. Posterior circulation: Dominant right vertebral artery. The distal basilar divides at the level of the superior cerebellar arteries. No branch occlusion or aneurysm Anatomic variants: None significant IMPRESSION: 1. Patchy acute cerebral infarcts on the right more than left with right-sided petechial hemorrhage. 2. No residual or recurrent right MCA thrombus. 3. No detected flow in the intracranial left ICA but well compensated by the left posterior communicating  artery. Chronic critical stenosis with underfilling of the left ICA bulb. Electronically Signed   By: Jorje Guild M.D.   On: 11/04/2020 08:09   MR BRAIN WO CONTRAST  Result Date: 11/04/2020 CLINICAL DATA:  Stroke follow-up EXAM: MRI HEAD WITHOUT CONTRAST MRA HEAD  WITHOUT CONTRAST TECHNIQUE: Multiplanar, multi-echo pulse sequences of the brain and surrounding structures were acquired without intravenous contrast. Angiographic images of the Circle of Willis were acquired using MRA technique without intravenous contrast. COMPARISON:  No pertinent prior exam. FINDINGS: MRI HEAD FINDINGS Brain: Patchy acute cortical infarct along the right MCA and border zone territory but also seen to a lesser extent in the white matter and striatum on the right. Less extensive acute infarcts in the lateral left frontal lobe, MCA territory. Pre-existing cortical infarcts asymmetric to the left. Petechial hemorrhage on the right and hemosiderin staining on the left. No hydrocephalus, collection, or shift Vascular: Absent left ICA flow void Skull and upper cervical spine: Normal marrow signal Sinuses/Orbits: Negative MRA HEAD FINDINGS Anterior circulation: Faint flow related signal in the left cervical ICA but none seen at the skull base or intracranially until ramification by the left posterior communicating artery. The right M1 and M2 branches are widely patent. No new embolus. Posterior circulation: Dominant right vertebral artery. The distal basilar divides at the level of the superior cerebellar arteries. No branch occlusion or aneurysm Anatomic variants: None significant IMPRESSION: 1. Patchy acute cerebral infarcts on the right more than left with right-sided petechial hemorrhage. 2. No residual or recurrent right MCA thrombus. 3. No detected flow in the intracranial left ICA but well compensated by the left posterior communicating artery. Chronic critical stenosis with underfilling of the left ICA bulb. Electronically  Signed   By: Jorje Guild M.D.   On: 11/04/2020 08:09   IR PTA Intracranial  Result Date: 11/04/2020 INDICATION: 65 year old male presents with acute right M1 occlusion, emergent large vessel occlusion, for mechanical thrombectomy EXAM: ULTRASOUND-GUIDED ACCESS RIGHT COMMON FEMORAL ARTERY ULTRASOUND-GUIDED ACCESS LEFT COMMON FEMORAL ARTERY RIGHT CERVICAL AND CEREBRAL ANGIOGRAM BALLOON ANGIOPLASTY CRITICAL STENOSIS RIGHT ICA RIGHT M1 MECHANICAL THROMBECTOMY ANGIO-SEAL FOR HEMOSTASIS. COMPARISON:  CT IMAGING SAME DAY MEDICATIONS: Preoperative T NK ANESTHESIA/SEDATION: The anesthesia team was present to provide general endotracheal tube anesthesia and for patient monitoring during the procedure. Intubation was performed in biplane/neuro interventional suite. Left radial arterial line was performed by the anesthesia team. Interventional neuro radiology nursing staff was also present. CONTRAST:  70 cc omni 350 FLUOROSCOPY TIME:  Fluoroscopy Time: 17 minutes 48 seconds (1029 mGy). COMPLICATIONS: None TECHNIQUE: Informed written consent was obtained from the patient's family after a thorough discussion of the procedural risks, benefits and alternatives. Specific risks discussed include: Bleeding, infection, contrast reaction, kidney injury/failure, need for further procedure/surgery, arterial injury or dissection, embolization to new territory, intracranial hemorrhage (10-15% risk), neurologic deterioration, cardiopulmonary collapse, death. All questions were addressed. Maximal Sterile Barrier Technique was utilized including during the procedure including caps, mask, sterile gowns, sterile gloves, sterile drape, hand hygiene and skin antiseptic. A timeout was performed prior to the initiation of the procedure. The anesthesia team was present to provide general endotracheal tube anesthesia and for patient monitoring during the procedure. Interventional neuro radiology nursing staff was also present. FINDINGS:  Initial Findings: Right common carotid artery:  Normal course caliber and contour. Right external carotid artery: Patent with antegrade flow. Atherosclerotic plaque extends into the proximal ECA with estimated 50 percent narrowing at the origin. Right internal carotid artery: Advanced atherosclerotic changes at the right carotid bifurcation, with at least 50 percent circumferential calcified plaque in the proximal right ICA, and extension of plaque into the proximal right external carotid artery. By NASCET criteria, the initial stenosis measures at least 81 percent . After balloon angioplasty, stenosis measures less than 50 percent.  Right MCA: Since the prior CT, there has been migration of the thrombus into the inferior division, with a superior division frontal branch filling. There is some contamination on the initial angiogram by the left-sided MCA branches given the significant right to left cross fill of the patent anterior communicating artery. Right ACA: A 1 segment patent. A 2 segment perfuses the right territory. Significant cross fill to the left. Completion Findings: Right MCA: TICI 3: Complete perfusion of the territory Significant cross fill persists from the right to the left. Less than 50 percent stenosis at the cervical ICA origin. PROCEDURE: The anesthesia team was present to provide general endotracheal tube anesthesia and for patient monitoring during the procedure. Intubation was performed in negative pressure [place] in neuro IR holding. Interventional neuro radiology nursing staff was also present. Ultrasound survey of the left inguinal region was performed with images stored and sent to PACs, confirming patency of the vessel. A micropuncture needle was used access the left common femoral artery under ultrasound. With excellent arterial blood flow returned, and an .018 micro wire was passed through the needle, observed enter the abdominal aorta under fluoroscopy. The needle was removed, and a  micropuncture sheath was placed over the wire. The inner dilator and wire were removed, and an 035 Bentson wire was advanced under fluoroscopy into the abdominal aorta. The sheath was removed and a standard 4 [place] vascular sheath was placed. The dilator was removed and the sheath was flushed. Ultrasound survey of the right inguinal region was performed with images stored and sent to PACs. Patency of the right common femoral artery confirmed. 11 blade scalpel was used to make a small incision. Blunt dissection was performed with US guidance. A micropuncture needle was used access the right common femoral artery under ultrasound. With excellent arterial blood flow returned, an .018 micro wire was passed through the needle, observed to enter the abdominal aorta under fluoroscopy. The needle was removed, and a micropuncture sheath was placed over the wire. The inner dilator and wire were removed, and an 035 wire was advanced under fluoroscopy into the abdominal aorta. The sheath was removed and a 25cm 52F straight vascular sheath was placed. The dilator was removed and the sheath was flushed. Sheath was attached to pressurized and heparinized saline bag for constant forward flow. JB 1 catheter was advanced with a Glidewire to the aortic arch. The Glidewire was removed and a double flush was performed. JB 1 was then used to select the innominate artery and the right common carotid artery origin. Catheter was advanced to the distal common carotid artery and the wire was removed. Angiogram was performed. Once we assessed the significant stenosis in the proximal right ICA, we elected to proceed 1st with balloon angioplasty. Exchange length roadrunner was advanced into the external carotid artery branches and the JB 1 was removed. A coaxial system was then advanced over the 035 wire. This included a 95cm 087 "[place]" balloon guide with coaxial 100cm JB 1 diagnostic catheter. This was advanced to the distal common carotid  artery. Wire and catheter were then removed. Double flush of the balloon guide was performed. Angiogram was performed for roadmap technique. Using the roadmap technique, A synchro soft wire and rapid transit catheter were used to cross the irregular stenotic plaque of the right ICA. Catheter wire were advanced into the distal cervical ICA and the wire was removed. A floppy transcend exchange length wire was then placed through the catheter and the catheter was removed. We then  proceeded with balloon angioplasty of the cervical ICA segment with a 5 mm x 30 mm Viatrac rapid exchange system. Ten atmospheric pressure was observed. As the balloon was slowly deflated, the balloon guide was advanced over the balloon and wire combination to the distal cervical segment. The balloon and wire were then removed from the system and double flush was performed at the balloon guide. Formal angiogram was performed. Road map function was used once the occluded vessel was identified. Copious back flush was performed and the balloon catheter was attached to heparinized and pressurized saline bag for forward flow. A second coaxial system was then advanced through the balloon catheter, which included the selected intermediate catheter, microcatheter, and microwire. In this scenario, the set up included a 132cm CAT-5 intermediate catheter, a Trevo Provue18 microcatheter, and 014 synchro soft wire. This system was advanced through the balloon guide catheter under the road-map function, with adequate back-flush at the rotating hemostatic valve at that back end of the balloon guide. Microcatheter and the intermediate catheter system were advanced through the terminal ICA and MCA to the level of the occlusion. The micro wire was then carefully advanced through the occluded segment. Microcatheter was then manipulated through the occluded segment and the wire was removed with saline drip at the hub. Intermediate catheter was advanced into the  proximal M1 segment. Blood was then aspirated through the hub of the microcatheter, and a gentle contrast injection was performed confirming intraluminal position. A rotating hemostatic valve was then attached to the back end of the microcatheter, and a pressurized and heparinized saline bag was attached to the catheter. 4 x 40 solitaire device was then selected. Back flush was achieved at the rotating hemostatic valve, and then the device was gently advanced through the microcatheter to the distal end. The retriever was then unsheathed by withdrawing the microcatheter under fluoroscopy. Once the retriever was completely unsheathed, the microcatheter was carefully stripped from the delivery device. Control angiogram was performed from the intermediate catheter. A 3 minute time interval was observed. The intermediate catheter was then attached to the proprietary suction engine and the catheter was slowly advanced over the solitaire until we observed stasis of flow within the intermediate catheter. The balloon at the balloon guide catheter was then inflated under fluoroscopy for proximal flow arrest. Constant aspiration using the proprietary engine was then performed at the intermediate catheter, as the retriever was gently and slowly withdrawn with fluoroscopic observation. Once the retriever was "corked" within the tip of the intermediate catheter, both were removed from the system. Free aspiration was confirmed at the hub of the balloon guide catheter, with free blood return confirmed. The balloon was then deflated, and a control angiogram was performed. Restoration of flow was confirmed. Balloon guide was withdrawn to the common carotid artery segment after placing the transcend wire to the distal cervical segment, to maintain wire cross the ICA origin. Angiogram of the cervical ICA was performed. Given the improved diameter on the conclusion, we elected not to deployed stent at this time, as that would require  dual anti-platelet therapy. Balloon guide and wire were then removed. The skin at the puncture site was then cleaned with Chlorhexidine. The 8 French sheath was removed and an 84F angioseal was deployed. Flat panel CT was performed. Patient tolerated the procedure well and remained hemodynamically stable throughout. No complications were encountered and no significant blood loss encountered. IMPRESSION: Status post ultrasound guided access right common femoral artery for right-sided cervical and cerebral angiogram, balloon  angioplasty of high-grade right ICA stenosis and mechanical thrombectomy of right M1 occlusion, achieving TICI 3 flow. Angio-Seal for hemostasis. Status post ultrasound-guided access left common femoral artery for placement of arterial monitoring line for anesthesia/critical care. Signed, Dulcy Fanny. Dellia Nims, RPVI [facility] Radiology PLAN: The patient will remain intubated, given the critical care status ICU status Target systolic blood pressure of 120-140 Right hip straight time 6 hours Left common femoral artery monitoring line. Frequent neurovascular checks Repeat neurologic imaging with CT and/MRI at the discretion of neurology team Electronically Signed   By: Corrie Mckusick D.O.   On: 11/04/2020 08:46   IR CT Head Ltd  Result Date: 11/04/2020 INDICATION: 65 year old male presents with acute right M1 occlusion, emergent large vessel occlusion, for mechanical thrombectomy EXAM: ULTRASOUND-GUIDED ACCESS RIGHT COMMON FEMORAL ARTERY ULTRASOUND-GUIDED ACCESS LEFT COMMON FEMORAL ARTERY RIGHT CERVICAL AND CEREBRAL ANGIOGRAM BALLOON ANGIOPLASTY CRITICAL STENOSIS RIGHT ICA RIGHT M1 MECHANICAL THROMBECTOMY ANGIO-SEAL FOR HEMOSTASIS. COMPARISON:  CT IMAGING SAME DAY MEDICATIONS: Preoperative T NK ANESTHESIA/SEDATION: The anesthesia team was present to provide general endotracheal tube anesthesia and for patient monitoring during the procedure.  Intubation was performed in biplane/neuro interventional suite. Left radial arterial line was performed by the anesthesia team. Interventional neuro radiology nursing staff was also present. CONTRAST:  70 cc omni 350 FLUOROSCOPY TIME:  Fluoroscopy Time: 17 minutes 48 seconds (1029 mGy). COMPLICATIONS: None TECHNIQUE: Informed written consent was obtained from the patient's family after a thorough discussion of the procedural risks, benefits and alternatives. Specific risks discussed include: Bleeding, infection, contrast reaction, kidney injury/failure, need for further procedure/surgery, arterial injury or dissection, embolization to new territory, intracranial hemorrhage (10-15% risk), neurologic deterioration, cardiopulmonary collapse, death. All questions were addressed. Maximal Sterile Barrier Technique was utilized including during the procedure including caps, mask, sterile gowns, sterile gloves, sterile drape, hand hygiene and skin antiseptic. A timeout was performed prior to the initiation of the procedure. The anesthesia team was present to provide general endotracheal tube anesthesia and for patient monitoring during the procedure. Interventional neuro radiology nursing staff was also present. FINDINGS: Initial Findings: Right common carotid artery:  Normal course caliber and contour. Right external carotid artery: Patent with antegrade flow. Atherosclerotic plaque extends into the proximal ECA with estimated 50 percent narrowing at the origin. Right internal carotid artery: Advanced atherosclerotic changes at the right carotid bifurcation, with at least 50 percent circumferential calcified plaque in the proximal right ICA, and extension of plaque into the proximal right external carotid artery. By NASCET criteria, the initial stenosis measures at least 81 percent . After balloon angioplasty, stenosis measures less than 50 percent. Right MCA: Since the prior CT, there has been migration of the thrombus  into the inferior division, with a superior division frontal branch filling. There is some contamination on the initial angiogram by the left-sided MCA branches given the significant right to left cross fill of the patent anterior communicating artery. Right ACA: A 1 segment patent. A 2 segment perfuses the right territory. Significant cross fill to the left. Completion Findings: Right MCA: TICI 3: Complete perfusion of the territory Significant cross fill persists from the right to the left. Less than 50 percent stenosis at the cervical ICA origin. PROCEDURE: The anesthesia team was present to provide general endotracheal tube anesthesia and for patient monitoring during the procedure. Intubation was performed in negative pressure [place] in neuro IR holding. Interventional neuro radiology nursing staff was also present. Ultrasound survey of the left inguinal region was performed  with images stored and sent to PACs, confirming patency of the vessel. A micropuncture needle was used access the left common femoral artery under ultrasound. With excellent arterial blood flow returned, and an .018 micro wire was passed through the needle, observed enter the abdominal aorta under fluoroscopy. The needle was removed, and a micropuncture sheath was placed over the wire. The inner dilator and wire were removed, and an 035 Bentson wire was advanced under fluoroscopy into the abdominal aorta. The sheath was removed and a standard 4 [place] vascular sheath was placed. The dilator was removed and the sheath was flushed. Ultrasound survey of the right inguinal region was performed with images stored and sent to PACs. Patency of the right common femoral artery confirmed. 11 blade scalpel was used to make a small incision. Blunt dissection was performed with US guidance. A micropuncture needle was used access the right common femoral artery under ultrasound. With excellent arterial blood flow returned, an .018 micro wire was passed  through the needle, observed to enter the abdominal aorta under fluoroscopy. The needle was removed, and a micropuncture sheath was placed over the wire. The inner dilator and wire were removed, and an 035 wire was advanced under fluoroscopy into the abdominal aorta. The sheath was removed and a 25cm 44F straight vascular sheath was placed. The dilator was removed and the sheath was flushed. Sheath was attached to pressurized and heparinized saline bag for constant forward flow. JB 1 catheter was advanced with a Glidewire to the aortic arch. The Glidewire was removed and a double flush was performed. JB 1 was then used to select the innominate artery and the right common carotid artery origin. Catheter was advanced to the distal common carotid artery and the wire was removed. Angiogram was performed. Once we assessed the significant stenosis in the proximal right ICA, we elected to proceed 1st with balloon angioplasty. Exchange length roadrunner was advanced into the external carotid artery branches and the JB 1 was removed. A coaxial system was then advanced over the 035 wire. This included a 95cm 087 "Walrus" balloon guide with coaxial 100cm JB 1 diagnostic catheter. This was advanced to the distal common carotid artery. Wire and catheter were then removed. Double flush of the balloon guide was performed. Angiogram was performed for roadmap technique. Using the roadmap technique, A synchro soft wire and rapid transit catheter were used to cross the irregular stenotic plaque of the right ICA. Catheter wire were advanced into the distal cervical ICA and the wire was removed. A floppy transcend exchange length wire was then placed through the catheter and the catheter was removed. We then proceeded with balloon angioplasty of the cervical ICA segment with a 5 mm x 30 mm Viatrac rapid exchange system. Ten atmospheric pressure was observed. As the balloon was slowly deflated, the balloon guide was advanced over the  balloon and wire combination to the distal cervical segment. The balloon and wire were then removed from the system and double flush was performed at the balloon guide. Formal angiogram was performed. Road map function was used once the occluded vessel was identified. Copious back flush was performed and the balloon catheter was attached to heparinized and pressurized saline bag for forward flow. A second coaxial system was then advanced through the balloon catheter, which included the selected intermediate catheter, microcatheter, and microwire. In this scenario, the set up included a 132cm CAT-5 intermediate catheter, a Trevo Provue18 microcatheter, and 014 synchro soft wire. This system was advanced through the  balloon guide catheter under the road-map function, with adequate back-flush at the rotating hemostatic valve at that back end of the balloon guide. Microcatheter and the intermediate catheter system were advanced through the terminal ICA and MCA to the level of the occlusion. The micro wire was then carefully advanced through the occluded segment. Microcatheter was then manipulated through the occluded segment and the wire was removed with saline drip at the hub. Intermediate catheter was advanced into the proximal M1 segment. Blood was then aspirated through the hub of the microcatheter, and a gentle contrast injection was performed confirming intraluminal position. A rotating hemostatic valve was then attached to the back end of the microcatheter, and a pressurized and heparinized saline bag was attached to the catheter. 4 x 40 solitaire device was then selected. Back flush was achieved at the rotating hemostatic valve, and then the device was gently advanced through the microcatheter to the distal end. The retriever was then unsheathed by withdrawing the microcatheter under fluoroscopy. Once the retriever was completely unsheathed, the microcatheter was carefully stripped from the delivery device.  Control angiogram was performed from the intermediate catheter. A 3 minute time interval was observed. The intermediate catheter was then attached to the proprietary suction engine and the catheter was slowly advanced over the solitaire until we observed stasis of flow within the intermediate catheter. The balloon at the balloon guide catheter was then inflated under fluoroscopy for proximal flow arrest. Constant aspiration using the proprietary engine was then performed at the intermediate catheter, as the retriever was gently and slowly withdrawn with fluoroscopic observation. Once the retriever was "corked" within the tip of the intermediate catheter, both were removed from the system. Free aspiration was confirmed at the hub of the balloon guide catheter, with free blood return confirmed. The balloon was then deflated, and a control angiogram was performed. Restoration of flow was confirmed. Balloon guide was withdrawn to the common carotid artery segment after placing the transcend wire to the distal cervical segment, to maintain wire cross the ICA origin. Angiogram of the cervical ICA was performed. Given the improved diameter on the conclusion, we elected not to deployed stent at this time, as that would require dual anti-platelet therapy. Balloon guide and wire were then removed. The skin at the puncture site was then cleaned with Chlorhexidine. The 8 French sheath was removed and an 42F angioseal was deployed. Flat panel CT was performed. Patient tolerated the procedure well and remained hemodynamically stable throughout. No complications were encountered and no significant blood loss encountered. IMPRESSION: Status post ultrasound guided access right common femoral artery for right-sided cervical and cerebral angiogram, balloon angioplasty of high-grade right ICA stenosis and mechanical thrombectomy of right M1 occlusion, achieving TICI 3 flow. Angio-Seal for hemostasis. Status post ultrasound-guided access  left common femoral artery for placement of arterial monitoring line for anesthesia/critical care. Signed, Dulcy Fanny. Dellia Nims, RPVI [facility] Radiology PLAN: The patient will remain intubated, given the critical care status ICU status Target systolic blood pressure of 120-140 Right hip straight time 6 hours Left common femoral artery monitoring line. Frequent neurovascular checks Repeat neurologic imaging with CT and/MRI at the discretion of neurology team Electronically Signed   By: Corrie Mckusick D.O.   On: 11/04/2020 08:46   IR US Guide Vasc Access Left  Result Date: 11/04/2020 INDICATION: 65 year old male presents with acute right M1 occlusion, emergent large vessel occlusion, for mechanical thrombectomy EXAM: ULTRASOUND-GUIDED ACCESS RIGHT COMMON FEMORAL ARTERY ULTRASOUND-GUIDED ACCESS LEFT  COMMON FEMORAL ARTERY RIGHT CERVICAL AND CEREBRAL ANGIOGRAM BALLOON ANGIOPLASTY CRITICAL STENOSIS RIGHT ICA RIGHT M1 MECHANICAL THROMBECTOMY ANGIO-SEAL FOR HEMOSTASIS. COMPARISON:  CT IMAGING SAME DAY MEDICATIONS: Preoperative T NK ANESTHESIA/SEDATION: The anesthesia team was present to provide general endotracheal tube anesthesia and for patient monitoring during the procedure. Intubation was performed in biplane/neuro interventional suite. Left radial arterial line was performed by the anesthesia team. Interventional neuro radiology nursing staff was also present. CONTRAST:  70 cc omni 350 FLUOROSCOPY TIME:  Fluoroscopy Time: 17 minutes 48 seconds (1029 mGy). COMPLICATIONS: None TECHNIQUE: Informed written consent was obtained from the patient's family after a thorough discussion of the procedural risks, benefits and alternatives. Specific risks discussed include: Bleeding, infection, contrast reaction, kidney injury/failure, need for further procedure/surgery, arterial injury or dissection, embolization to new territory, intracranial hemorrhage (10-15% risk),  neurologic deterioration, cardiopulmonary collapse, death. All questions were addressed. Maximal Sterile Barrier Technique was utilized including during the procedure including caps, mask, sterile gowns, sterile gloves, sterile drape, hand hygiene and skin antiseptic. A timeout was performed prior to the initiation of the procedure. The anesthesia team was present to provide general endotracheal tube anesthesia and for patient monitoring during the procedure. Interventional neuro radiology nursing staff was also present. FINDINGS: Initial Findings: Right common carotid artery:  Normal course caliber and contour. Right external carotid artery: Patent with antegrade flow. Atherosclerotic plaque extends into the proximal ECA with estimated 50 percent narrowing at the origin. Right internal carotid artery: Advanced atherosclerotic changes at the right carotid bifurcation, with at least 50 percent circumferential calcified plaque in the proximal right ICA, and extension of plaque into the proximal right external carotid artery. By NASCET criteria, the initial stenosis measures at least 81 percent . After balloon angioplasty, stenosis measures less than 50 percent. Right MCA: Since the prior CT, there has been migration of the thrombus into the inferior division, with a superior division frontal branch filling. There is some contamination on the initial angiogram by the left-sided MCA branches given the significant right to left cross fill of the patent anterior communicating artery. Right ACA: A 1 segment patent. A 2 segment perfuses the right territory. Significant cross fill to the left. Completion Findings: Right MCA: TICI 3: Complete perfusion of the territory Significant cross fill persists from the right to the left. Less than 50 percent stenosis at the cervical ICA origin. PROCEDURE: The anesthesia team was present to provide general endotracheal tube anesthesia and for patient monitoring during the procedure.  Intubation was performed in negative pressure [place] in neuro IR holding. Interventional neuro radiology nursing staff was also present. Ultrasound survey of the left inguinal region was performed with images stored and sent to PACs, confirming patency of the vessel. A micropuncture needle was used access the left common femoral artery under ultrasound. With excellent arterial blood flow returned, and an .018 micro wire was passed through the needle, observed enter the abdominal aorta under fluoroscopy. The needle was removed, and a micropuncture sheath was placed over the wire. The inner dilator and wire were removed, and an 035 Bentson wire was advanced under fluoroscopy into the abdominal aorta. The sheath was removed and a standard 4 [place] vascular sheath was placed. The dilator was removed and the sheath was flushed. Ultrasound survey of the right inguinal region was performed with images stored and sent to PACs. Patency of the right common femoral artery confirmed. 11 blade scalpel was used to make a small incision. Blunt dissection was performed with US guidance. A micropuncture  needle was used access the right common femoral artery under ultrasound. With excellent arterial blood flow returned, an .018 micro wire was passed through the needle, observed to enter the abdominal aorta under fluoroscopy. The needle was removed, and a micropuncture sheath was placed over the wire. The inner dilator and wire were removed, and an 035 wire was advanced under fluoroscopy into the abdominal aorta. The sheath was removed and a 25cm 73F straight vascular sheath was placed. The dilator was removed and the sheath was flushed. Sheath was attached to pressurized and heparinized saline bag for constant forward flow. JB 1 catheter was advanced with a Glidewire to the aortic arch. The Glidewire was removed and a double flush was performed. JB 1 was then used to select the innominate artery and the right common carotid artery  origin. Catheter was advanced to the distal common carotid artery and the wire was removed. Angiogram was performed. Once we assessed the significant stenosis in the proximal right ICA, we elected to proceed 1st with balloon angioplasty. Exchange length roadrunner was advanced into the external carotid artery branches and the JB 1 was removed. A coaxial system was then advanced over the 035 wire. This included a 95cm 087 "[place]" balloon guide with coaxial 100cm JB 1 diagnostic catheter. This was advanced to the distal common carotid artery. Wire and catheter were then removed. Double flush of the balloon guide was performed. Angiogram was performed for roadmap technique. Using the roadmap technique, A synchro soft wire and rapid transit catheter were used to cross the irregular stenotic plaque of the right ICA. Catheter wire were advanced into the distal cervical ICA and the wire was removed. A floppy transcend exchange length wire was then placed through the catheter and the catheter was removed. We then proceeded with balloon angioplasty of the cervical ICA segment with a 5 mm x 30 mm Viatrac rapid exchange system. Ten atmospheric pressure was observed. As the balloon was slowly deflated, the balloon guide was advanced over the balloon and wire combination to the distal cervical segment. The balloon and wire were then removed from the system and double flush was performed at the balloon guide. Formal angiogram was performed. Road map function was used once the occluded vessel was identified. Copious back flush was performed and the balloon catheter was attached to heparinized and pressurized saline bag for forward flow. A second coaxial system was then advanced through the balloon catheter, which included the selected intermediate catheter, microcatheter, and microwire. In this scenario, the set up included a 132cm CAT-5 intermediate catheter, a Trevo Provue18 microcatheter, and 014 synchro soft wire. This system  was advanced through the balloon guide catheter under the road-map function, with adequate back-flush at the rotating hemostatic valve at that back end of the balloon guide. Microcatheter and the intermediate catheter system were advanced through the terminal ICA and MCA to the level of the occlusion. The micro wire was then carefully advanced through the occluded segment. Microcatheter was then manipulated through the occluded segment and the wire was removed with saline drip at the hub. Intermediate catheter was advanced into the proximal M1 segment. Blood was then aspirated through the hub of the microcatheter, and a gentle contrast injection was performed confirming intraluminal position. A rotating hemostatic valve was then attached to the back end of the microcatheter, and a pressurized and heparinized saline bag was attached to the catheter. 4 x 40 solitaire device was then selected. Back flush was achieved at the rotating hemostatic valve, and  then the device was gently advanced through the microcatheter to the distal end. The retriever was then unsheathed by withdrawing the microcatheter under fluoroscopy. Once the retriever was completely unsheathed, the microcatheter was carefully stripped from the delivery device. Control angiogram was performed from the intermediate catheter. A 3 minute time interval was observed. The intermediate catheter was then attached to the proprietary suction engine and the catheter was slowly advanced over the solitaire until we observed stasis of flow within the intermediate catheter. The balloon at the balloon guide catheter was then inflated under fluoroscopy for proximal flow arrest. Constant aspiration using the proprietary engine was then performed at the intermediate catheter, as the retriever was gently and slowly withdrawn with fluoroscopic observation. Once the retriever was "corked" within the tip of the intermediate catheter, both were removed from the system. Free  aspiration was confirmed at the hub of the balloon guide catheter, with free blood return confirmed. The balloon was then deflated, and a control angiogram was performed. Restoration of flow was confirmed. Balloon guide was withdrawn to the common carotid artery segment after placing the transcend wire to the distal cervical segment, to maintain wire cross the ICA origin. Angiogram of the cervical ICA was performed. Given the improved diameter on the conclusion, we elected not to deployed stent at this time, as that would require dual anti-platelet therapy. Balloon guide and wire were then removed. The skin at the puncture site was then cleaned with Chlorhexidine. The 8 French sheath was removed and an 35F angioseal was deployed. Flat panel CT was performed. Patient tolerated the procedure well and remained hemodynamically stable throughout. No complications were encountered and no significant blood loss encountered. IMPRESSION: Status post ultrasound guided access right common femoral artery for right-sided cervical and cerebral angiogram, balloon angioplasty of high-grade right ICA stenosis and mechanical thrombectomy of right M1 occlusion, achieving TICI 3 flow. Angio-Seal for hemostasis. Status post ultrasound-guided access left common femoral artery for placement of arterial monitoring line for anesthesia/critical care. Signed, Dulcy Fanny. Dellia Nims, RPVI [facility] Radiology PLAN: The patient will remain intubated, given the critical care status ICU status Target systolic blood pressure of 120-140 Right hip straight time 6 hours Left common femoral artery monitoring line. Frequent neurovascular checks Repeat neurologic imaging with CT and/MRI at the discretion of neurology team Electronically Signed   By: Corrie Mckusick D.O.   On: 11/04/2020 08:46   IR US Guide Vasc Access Right  Result Date: 11/04/2020 INDICATION: 65 year old male presents with acute right M1  occlusion, emergent large vessel occlusion, for mechanical thrombectomy EXAM: ULTRASOUND-GUIDED ACCESS RIGHT COMMON FEMORAL ARTERY ULTRASOUND-GUIDED ACCESS LEFT COMMON FEMORAL ARTERY RIGHT CERVICAL AND CEREBRAL ANGIOGRAM BALLOON ANGIOPLASTY CRITICAL STENOSIS RIGHT ICA RIGHT M1 MECHANICAL THROMBECTOMY ANGIO-SEAL FOR HEMOSTASIS. COMPARISON:  CT IMAGING SAME DAY MEDICATIONS: Preoperative T NK ANESTHESIA/SEDATION: The anesthesia team was present to provide general endotracheal tube anesthesia and for patient monitoring during the procedure. Intubation was performed in biplane/neuro interventional suite. Left radial arterial line was performed by the anesthesia team. Interventional neuro radiology nursing staff was also present. CONTRAST:  70 cc omni 350 FLUOROSCOPY TIME:  Fluoroscopy Time: 17 minutes 48 seconds (1029 mGy). COMPLICATIONS: None TECHNIQUE: Informed written consent was obtained from the patient's family after a thorough discussion of the procedural risks, benefits and alternatives. Specific risks discussed include: Bleeding, infection, contrast reaction, kidney injury/failure, need for further procedure/surgery, arterial injury or dissection, embolization to new territory, intracranial hemorrhage (10-15% risk), neurologic deterioration, cardiopulmonary collapse, death.  All questions were addressed. Maximal Sterile Barrier Technique was utilized including during the procedure including caps, mask, sterile gowns, sterile gloves, sterile drape, hand hygiene and skin antiseptic. A timeout was performed prior to the initiation of the procedure. The anesthesia team was present to provide general endotracheal tube anesthesia and for patient monitoring during the procedure. Interventional neuro radiology nursing staff was also present. FINDINGS: Initial Findings: Right common carotid artery:  Normal course caliber and contour. Right external carotid artery: Patent with antegrade flow. Atherosclerotic plaque extends  into the proximal ECA with estimated 50 percent narrowing at the origin. Right internal carotid artery: Advanced atherosclerotic changes at the right carotid bifurcation, with at least 50 percent circumferential calcified plaque in the proximal right ICA, and extension of plaque into the proximal right external carotid artery. By NASCET criteria, the initial stenosis measures at least 81 percent . After balloon angioplasty, stenosis measures less than 50 percent. Right MCA: Since the prior CT, there has been migration of the thrombus into the inferior division, with a superior division frontal branch filling. There is some contamination on the initial angiogram by the left-sided MCA branches given the significant right to left cross fill of the patent anterior communicating artery. Right ACA: A 1 segment patent. A 2 segment perfuses the right territory. Significant cross fill to the left. Completion Findings: Right MCA: TICI 3: Complete perfusion of the territory Significant cross fill persists from the right to the left. Less than 50 percent stenosis at the cervical ICA origin. PROCEDURE: The anesthesia team was present to provide general endotracheal tube anesthesia and for patient monitoring during the procedure. Intubation was performed in negative pressure [place] in neuro IR holding. Interventional neuro radiology nursing staff was also present. Ultrasound survey of the left inguinal region was performed with images stored and sent to PACs, confirming patency of the vessel. A micropuncture needle was used access the left common femoral artery under ultrasound. With excellent arterial blood flow returned, and an .018 micro wire was passed through the needle, observed enter the abdominal aorta under fluoroscopy. The needle was removed, and a micropuncture sheath was placed over the wire. The inner dilator and wire were removed, and an 035 Bentson wire was advanced under fluoroscopy into the abdominal aorta. The  sheath was removed and a standard 4 [place] vascular sheath was placed. The dilator was removed and the sheath was flushed. Ultrasound survey of the right inguinal region was performed with images stored and sent to PACs. Patency of the right common femoral artery confirmed. 11 blade scalpel was used to make a small incision. Blunt dissection was performed with US guidance. A micropuncture needle was used access the right common femoral artery under ultrasound. With excellent arterial blood flow returned, an .018 micro wire was passed through the needle, observed to enter the abdominal aorta under fluoroscopy. The needle was removed, and a micropuncture sheath was placed over the wire. The inner dilator and wire were removed, and an 035 wire was advanced under fluoroscopy into the abdominal aorta. The sheath was removed and a 25cm 75F straight vascular sheath was placed. The dilator was removed and the sheath was flushed. Sheath was attached to pressurized and heparinized saline bag for constant forward flow. JB 1 catheter was advanced with a Glidewire to the aortic arch. The Glidewire was removed and a double flush was performed. JB 1 was then used to select the innominate artery and the right common carotid artery origin. Catheter was advanced to the distal  common carotid artery and the wire was removed. Angiogram was performed. Once we assessed the significant stenosis in the proximal right ICA, we elected to proceed 1st with balloon angioplasty. Exchange length roadrunner was advanced into the external carotid artery branches and the JB 1 was removed. A coaxial system was then advanced over the 035 wire. This included a 95cm 087 "Walrus" balloon guide with coaxial 100cm JB 1 diagnostic catheter. This was advanced to the distal common carotid artery. Wire and catheter were then removed. Double flush of the balloon guide was performed. Angiogram was performed for roadmap technique. Using the roadmap technique, A  synchro soft wire and rapid transit catheter were used to cross the irregular stenotic plaque of the right ICA. Catheter wire were advanced into the distal cervical ICA and the wire was removed. A floppy transcend exchange length wire was then placed through the catheter and the catheter was removed. We then proceeded with balloon angioplasty of the cervical ICA segment with a 5 mm x 30 mm Viatrac rapid exchange system. Ten atmospheric pressure was observed. As the balloon was slowly deflated, the balloon guide was advanced over the balloon and wire combination to the distal cervical segment. The balloon and wire were then removed from the system and double flush was performed at the balloon guide. Formal angiogram was performed. Road map function was used once the occluded vessel was identified. Copious back flush was performed and the balloon catheter was attached to heparinized and pressurized saline bag for forward flow. A second coaxial system was then advanced through the balloon catheter, which included the selected intermediate catheter, microcatheter, and microwire. In this scenario, the set up included a 132cm CAT-5 intermediate catheter, a Trevo Provue18 microcatheter, and 014 synchro soft wire. This system was advanced through the balloon guide catheter under the road-map function, with adequate back-flush at the rotating hemostatic valve at that back end of the balloon guide. Microcatheter and the intermediate catheter system were advanced through the terminal ICA and MCA to the level of the occlusion. The micro wire was then carefully advanced through the occluded segment. Microcatheter was then manipulated through the occluded segment and the wire was removed with saline drip at the hub. Intermediate catheter was advanced into the proximal M1 segment. Blood was then aspirated through the hub of the microcatheter, and a gentle contrast injection was performed confirming intraluminal position. A  rotating hemostatic valve was then attached to the back end of the microcatheter, and a pressurized and heparinized saline bag was attached to the catheter. 4 x 40 solitaire device was then selected. Back flush was achieved at the rotating hemostatic valve, and then the device was gently advanced through the microcatheter to the distal end. The retriever was then unsheathed by withdrawing the microcatheter under fluoroscopy. Once the retriever was completely unsheathed, the microcatheter was carefully stripped from the delivery device. Control angiogram was performed from the intermediate catheter. A 3 minute time interval was observed. The intermediate catheter was then attached to the proprietary suction engine and the catheter was slowly advanced over the solitaire until we observed stasis of flow within the intermediate catheter. The balloon at the balloon guide catheter was then inflated under fluoroscopy for proximal flow arrest. Constant aspiration using the proprietary engine was then performed at the intermediate catheter, as the retriever was gently and slowly withdrawn with fluoroscopic observation. Once the retriever was "corked" within the tip of the intermediate catheter, both were removed from the system. Free aspiration was confirmed  at the hub of the balloon guide catheter, with free blood return confirmed. The balloon was then deflated, and a control angiogram was performed. Restoration of flow was confirmed. Balloon guide was withdrawn to the common carotid artery segment after placing the transcend wire to the distal cervical segment, to maintain wire cross the ICA origin. Angiogram of the cervical ICA was performed. Given the improved diameter on the conclusion, we elected not to deployed stent at this time, as that would require dual anti-platelet therapy. Balloon guide and wire were then removed. The skin at the puncture site was then cleaned with Chlorhexidine. The 8 French sheath was removed  and an 4F angioseal was deployed. Flat panel CT was performed. Patient tolerated the procedure well and remained hemodynamically stable throughout. No complications were encountered and no significant blood loss encountered. IMPRESSION: Status post ultrasound guided access right common femoral artery for right-sided cervical and cerebral angiogram, balloon angioplasty of high-grade right ICA stenosis and mechanical thrombectomy of right M1 occlusion, achieving TICI 3 flow. Angio-Seal for hemostasis. Status post ultrasound-guided access left common femoral artery for placement of arterial monitoring line for anesthesia/critical care. Signed, Dulcy Fanny. Dellia Nims, RPVI [facility] Radiology PLAN: The patient will remain intubated, given the critical care status ICU status Target systolic blood pressure of 120-140 Right hip straight time 6 hours Left common femoral artery monitoring line. Frequent neurovascular checks Repeat neurologic imaging with CT and/MRI at the discretion of neurology team Electronically Signed   By: Corrie Mckusick D.O.   On: 11/04/2020 08:46   Portable Chest x-ray  Result Date: 11/03/2020 CLINICAL DATA:  Status post endotracheal tube and OG tube placement. EXAM: PORTABLE CHEST 1 VIEW COMPARISON:  Chest x-ray dated 12/02/2015. FINDINGS: Endotracheal tube appears well positioned with tip above the level of the carina. Enteric tube passes below the diaphragm. Mild central pulmonary vascular congestion. Probable mild atelectasis at the medial aspect of the RIGHT lung base. Coarse interstitial markings throughout the remainder of both lungs. No pleural effusion or pneumothorax is seen. IMPRESSION: 1. Endotracheal and enteric tubes appear well positioned. 2. Central pulmonary vascular congestion and coarse interstitial markings throughout both lungs, most likely interstitial edema superimposed on chronic interstitial lung disease. 3. Probable  atelectasis at the RIGHT lung base. Electronically Signed   By: Franki Cabot M.D.   On: 11/03/2020 09:53   ECHOCARDIOGRAM COMPLETE  Result Date: 11/03/2020    ECHOCARDIOGRAM REPORT   Patient Name:   Christopher Lang Date of Exam: 11/03/2020 Medical Rec #:  [id number]       Height:       72.0 in Accession #:    [id number]      Weight:       193.1 lb Date of Birth:  [birth year]       BSA:          2.099 m Patient Age:    82 years        BP:           141/86 mmHg Patient Gender: M               HR:           74 bpm. Exam Location:  Inpatient Procedure: 2D Echo, Cardiac Doppler, Color Doppler and Intracardiac            Opacification Agent Indications:    Acute myocardial infarction  History:        Patient has prior history of  Echocardiogram examinations, most                 recent 06/07/2019. CHF, COPD, Arrythmias:Atrial Fibrillation;                 Risk Factors:Current Smoker, Dyslipidemia, Hypertension and                 Sleep Apnea.  Sonographer:    Clayton Lefort RDCS (AE) Referring Phys: [id number] [place]  1. The mid to distal septum/anterior segments are akinetic. The entire apex is akinetic. The myocardium appears thin. Suspect these findings represent a large wrap around LAD infarction (troponin >24,000). No LV thrombus on contrast imaging. There is severe LV dysfunction, EF 20-25%. Left ventricular ejection fraction, by estimation, is 20 to 25%. The left ventricle has severely decreased function. The left ventricle demonstrates regional wall motion abnormalities (see scoring diagram/findings for description). There is mild left ventricular hypertrophy. Left ventricular diastolic parameters are consistent with Grade I diastolic dysfunction (impaired relaxation).  2. Right ventricular systolic function is normal. The right ventricular size is normal. Tricuspid regurgitation signal is inadequate for assessing PA pressure.  3. The mitral valve is grossly normal. Trivial mitral valve  regurgitation. No evidence of mitral stenosis.  4. The aortic valve is tricuspid. There is moderate calcification of the aortic valve. Aortic valve regurgitation is not visualized. Mild to moderate aortic valve sclerosis/calcification is present, without any evidence of aortic stenosis.  5. The inferior vena cava is normal in size with <50% respiratory variability, suggesting right atrial pressure of 8 mmHg. Comparison(s): Changes from prior study are noted. LVEF now severely reduced 20-25%. WMA suggest LAD infarction. Conclusion(s)/Recommendation(s): Findings consistent with ischemic cardiomyopathy. FINDINGS  Left Ventricle: The mid to distal septum/anterior segments are akinetic. The entire apex is akinetic. The myocardium appears thin. Suspect these findings represent a large wrap around LAD infarction (troponin >24,000). No LV thrombus on contrast imaging. There is severe LV dysfunction, EF 20-25%. Left ventricular ejection fraction, by estimation, is 20 to 25%. The left ventricle has severely decreased function. The left ventricle demonstrates regional wall motion abnormalities. Definity contrast  agent was given IV to delineate the left ventricular endocardial borders. The left ventricular internal cavity size was normal in size. There is mild left ventricular hypertrophy. Left ventricular diastolic parameters are consistent with Grade I diastolic dysfunction (impaired relaxation).  LV Wall Scoring: The mid and distal anterior wall, mid and distal anterior septum, entire apex, and mid inferoseptal segment are akinetic. Right Ventricle: The right ventricular size is normal. No increase in right ventricular wall thickness. Right ventricular systolic function is normal. Tricuspid regurgitation signal is inadequate for assessing PA pressure. Left Atrium: Left atrial size was normal in size. Right Atrium: Right atrial size was normal in size. Pericardium: There is no evidence of pericardial effusion. Mitral Valve:  The mitral valve is grossly normal. Mild mitral annular calcification. Trivial mitral valve regurgitation. No evidence of mitral valve stenosis. MV peak gradient, 5.6 mmHg. The mean mitral valve gradient is 3.0 mmHg. Tricuspid Valve: The tricuspid valve is grossly normal. Tricuspid valve regurgitation is trivial. No evidence of tricuspid stenosis. Aortic Valve: The aortic valve is tricuspid. There is moderate calcification of the aortic valve. Aortic valve regurgitation is not visualized. Mild to moderate aortic valve sclerosis/calcification is present, without any evidence of aortic stenosis. Aortic valve mean gradient measures 4.0 mmHg. Aortic valve peak gradient measures 7.6 mmHg. Aortic valve area, by VTI measures 1.80 cm. Pulmonic Valve: The pulmonic  valve was grossly normal. Pulmonic valve regurgitation is trivial. No evidence of pulmonic stenosis. Aorta: The aortic root is normal in size and structure. Venous: The inferior vena cava is normal in size with less than 50% respiratory variability, suggesting right atrial pressure of 8 mmHg. IAS/Shunts: The atrial septum is grossly normal.  LEFT VENTRICLE PLAX 2D LVIDd:         4.40 cm   Diastology LVIDs:         3.10 cm   LV e' medial:    6.53 cm/s LV PW:         1.20 cm   LV E/e' medial:  12.5 LV IVS:        1.20 cm   LV e' lateral:   6.75 cm/s LVOT diam:     2.00 cm   LV E/e' lateral: 12.1 LV SV:         54 LV SV Index:   26 LVOT Area:     3.14 cm                           3D Volume EF:                          3D EF:        42 %                          LV EDV:       154 ml                          LV ESV:       89 ml                          LV SV:        64 ml RIGHT VENTRICLE             IVC RV Basal diam:  2.90 cm     IVC diam: 1.90 cm RV S prime:     16.10 cm/s TAPSE (M-mode): 2.3 cm LEFT ATRIUM           Index        RIGHT ATRIUM           Index LA diam:      3.30 cm 1.57 cm/m   RA Area:     19.20 cm LA Vol (A4C): 45.0 ml 21.44 ml/m  RA Volume:    56.10 ml  26.73 ml/m  AORTIC VALVE AV Area (Vmax):    2.05 cm AV Area (Vmean):   1.83 cm AV Area (VTI):     1.80 cm AV Vmax:           138.00 cm/s AV Vmean:          98.600 cm/s AV VTI:            0.298 m AV Peak Grad:      7.6 mmHg AV Mean Grad:      4.0 mmHg LVOT Vmax:         90.10 cm/s LVOT Vmean:        57.500 cm/s LVOT VTI:          0.171 m LVOT/AV VTI ratio: 0.57  AORTA Ao Root diam: 3.50 cm Ao Asc diam:  2.90 cm MITRAL VALVE  MV Area (PHT): 3.66 cm     SHUNTS MV Area VTI:   1.70 cm     Systemic VTI:  0.17 m MV Peak grad:  5.6 mmHg     Systemic Diam: 2.00 cm MV Mean grad:  3.0 mmHg MV Vmax:       1.18 m/s MV Vmean:      75.5 cm/s MV Decel Time: 207 msec MV E velocity: 81.50 cm/s MV A velocity: 128.00 cm/s MV E/A ratio:  0.64 Eleonore Chiquito MD Electronically signed by Eleonore Chiquito MD Signature Date/Time: 11/03/2020/4:49:02 PM    Final    IR PERCUTANEOUS ART THROMBECTOMY/INFUSION INTRACRANIAL INC DIAG ANGIO  Result Date: 11/04/2020 INDICATION: 65 year old male presents with acute right M1 occlusion, emergent large vessel occlusion, for mechanical thrombectomy EXAM: ULTRASOUND-GUIDED ACCESS RIGHT COMMON FEMORAL ARTERY ULTRASOUND-GUIDED ACCESS LEFT COMMON FEMORAL ARTERY RIGHT CERVICAL AND CEREBRAL ANGIOGRAM BALLOON ANGIOPLASTY CRITICAL STENOSIS RIGHT ICA RIGHT M1 MECHANICAL THROMBECTOMY ANGIO-SEAL FOR HEMOSTASIS. COMPARISON:  CT IMAGING SAME DAY MEDICATIONS: Preoperative T NK ANESTHESIA/SEDATION: The anesthesia team was present to provide general endotracheal tube anesthesia and for patient monitoring during the procedure. Intubation was performed in biplane/neuro interventional suite. Left radial arterial line was performed by the anesthesia team. Interventional neuro radiology nursing staff was also present. CONTRAST:  70 cc omni 350 FLUOROSCOPY TIME:  Fluoroscopy Time: 17 minutes 48 seconds (1029 mGy). COMPLICATIONS: None TECHNIQUE: Informed written consent was obtained from the patient's family  after a thorough discussion of the procedural risks, benefits and alternatives. Specific risks discussed include: Bleeding, infection, contrast reaction, kidney injury/failure, need for further procedure/surgery, arterial injury or dissection, embolization to new territory, intracranial hemorrhage (10-15% risk), neurologic deterioration, cardiopulmonary collapse, death. All questions were addressed. Maximal Sterile Barrier Technique was utilized including during the procedure including caps, mask, sterile gowns, sterile gloves, sterile drape, hand hygiene and skin antiseptic. A timeout was performed prior to the initiation of the procedure. The anesthesia team was present to provide general endotracheal tube anesthesia and for patient monitoring during the procedure. Interventional neuro radiology nursing staff was also present. FINDINGS: Initial Findings: Right common carotid artery:  Normal course caliber and contour. Right external carotid artery: Patent with antegrade flow. Atherosclerotic plaque extends into the proximal ECA with estimated 50 percent narrowing at the origin. Right internal carotid artery: Advanced atherosclerotic changes at the right carotid bifurcation, with at least 50 percent circumferential calcified plaque in the proximal right ICA, and extension of plaque into the proximal right external carotid artery. By NASCET criteria, the initial stenosis measures at least 81 percent . After balloon angioplasty, stenosis measures less than 50 percent. Right MCA: Since the prior CT, there has been migration of the thrombus into the inferior division, with a superior division frontal branch filling. There is some contamination on the initial angiogram by the left-sided MCA branches given the significant right to left cross fill of the patent anterior communicating artery. Right ACA: A 1 segment patent. A 2 segment perfuses the right territory. Significant cross fill to the left. Completion Findings:  Right MCA: TICI 3: Complete perfusion of the territory Significant cross fill persists from the right to the left. Less than 50 percent stenosis at the cervical ICA origin. PROCEDURE: The anesthesia team was present to provide general endotracheal tube anesthesia and for patient monitoring during the procedure. Intubation was performed in negative pressure [place] in neuro IR holding. Interventional neuro radiology nursing staff was also present. Ultrasound survey of the left inguinal region was performed with images stored  and sent to PACs, confirming patency of the vessel. A micropuncture needle was used access the left common femoral artery under ultrasound. With excellent arterial blood flow returned, and an .018 micro wire was passed through the needle, observed enter the abdominal aorta under fluoroscopy. The needle was removed, and a micropuncture sheath was placed over the wire. The inner dilator and wire were removed, and an 035 Bentson wire was advanced under fluoroscopy into the abdominal aorta. The sheath was removed and a standard 4 [place] vascular sheath was placed. The dilator was removed and the sheath was flushed. Ultrasound survey of the right inguinal region was performed with images stored and sent to PACs. Patency of the right common femoral artery confirmed. 11 blade scalpel was used to make a small incision. Blunt dissection was performed with US guidance. A micropuncture needle was used access the right common femoral artery under ultrasound. With excellent arterial blood flow returned, an .018 micro wire was passed through the needle, observed to enter the abdominal aorta under fluoroscopy. The needle was removed, and a micropuncture sheath was placed over the wire. The inner dilator and wire were removed, and an 035 wire was advanced under fluoroscopy into the abdominal aorta. The sheath was removed and a 25cm 43F straight vascular sheath was placed. The dilator was removed and the sheath was  flushed. Sheath was attached to pressurized and heparinized saline bag for constant forward flow. JB 1 catheter was advanced with a Glidewire to the aortic arch. The Glidewire was removed and a double flush was performed. JB 1 was then used to select the innominate artery and the right common carotid artery origin. Catheter was advanced to the distal common carotid artery and the wire was removed. Angiogram was performed. Once we assessed the significant stenosis in the proximal right ICA, we elected to proceed 1st with balloon angioplasty. Exchange length roadrunner was advanced into the external carotid artery branches and the JB 1 was removed. A coaxial system was then advanced over the 035 wire. This included a 95cm 087 "[place]rus" balloon guide with coaxial 100cm JB 1 diagnostic catheter. This was advanced to the distal common carotid artery. Wire and catheter were then removed. Double flush of the balloon guide was performed. Angiogram was performed for roadmap technique. Using the roadmap technique, A synchro soft wire and rapid transit catheter were used to cross the irregular stenotic plaque of the right ICA. Catheter wire were advanced into the distal cervical ICA and the wire was removed. A floppy transcend exchange length wire was then placed through the catheter and the catheter was removed. We then proceeded with balloon angioplasty of the cervical ICA segment with a 5 mm x 30 mm Viatrac rapid exchange system. Ten atmospheric pressure was observed. As the balloon was slowly deflated, the balloon guide was advanced over the balloon and wire combination to the distal cervical segment. The balloon and wire were then removed from the system and double flush was performed at the balloon guide. Formal angiogram was performed. Road map function was used once the occluded vessel was identified. Copious back flush was performed and the balloon catheter was attached to heparinized and pressurized saline bag for  forward flow. A second coaxial system was then advanced through the balloon catheter, which included the selected intermediate catheter, microcatheter, and microwire. In this scenario, the set up included a 132cm CAT-5 intermediate catheter, a Trevo Provue18 microcatheter, and 014 synchro soft wire. This system was advanced through the balloon guide catheter  under the road-map function, with adequate back-flush at the rotating hemostatic valve at that back end of the balloon guide. Microcatheter and the intermediate catheter system were advanced through the terminal ICA and MCA to the level of the occlusion. The micro wire was then carefully advanced through the occluded segment. Microcatheter was then manipulated through the occluded segment and the wire was removed with saline drip at the hub. Intermediate catheter was advanced into the proximal M1 segment. Blood was then aspirated through the hub of the microcatheter, and a gentle contrast injection was performed confirming intraluminal position. A rotating hemostatic valve was then attached to the back end of the microcatheter, and a pressurized and heparinized saline bag was attached to the catheter. 4 x 40 solitaire device was then selected. Back flush was achieved at the rotating hemostatic valve, and then the device was gently advanced through the microcatheter to the distal end. The retriever was then unsheathed by withdrawing the microcatheter under fluoroscopy. Once the retriever was completely unsheathed, the microcatheter was carefully stripped from the delivery device. Control angiogram was performed from the intermediate catheter. A 3 minute time interval was observed. The intermediate catheter was then attached to the proprietary suction engine and the catheter was slowly advanced over the solitaire until we observed stasis of flow within the intermediate catheter. The balloon at the balloon guide catheter was then inflated under fluoroscopy for  proximal flow arrest. Constant aspiration using the proprietary engine was then performed at the intermediate catheter, as the retriever was gently and slowly withdrawn with fluoroscopic observation. Once the retriever was "corked" within the tip of the intermediate catheter, both were removed from the system. Free aspiration was confirmed at the hub of the balloon guide catheter, with free blood return confirmed. The balloon was then deflated, and a control angiogram was performed. Restoration of flow was confirmed. Balloon guide was withdrawn to the common carotid artery segment after placing the transcend wire to the distal cervical segment, to maintain wire cross the ICA origin. Angiogram of the cervical ICA was performed. Given the improved diameter on the conclusion, we elected not to deployed stent at this time, as that would require dual anti-platelet therapy. Balloon guide and wire were then removed. The skin at the puncture site was then cleaned with Chlorhexidine. The 8 French sheath was removed and an 39F angioseal was deployed. Flat panel CT was performed. Patient tolerated the procedure well and remained hemodynamically stable throughout. No complications were encountered and no significant blood loss encountered. IMPRESSION: Status post ultrasound guided access right common femoral artery for right-sided cervical and cerebral angiogram, balloon angioplasty of high-grade right ICA stenosis and mechanical thrombectomy of right M1 occlusion, achieving TICI 3 flow. Angio-Seal for hemostasis. Status post ultrasound-guided access left common femoral artery for placement of arterial monitoring line for anesthesia/critical care. Signed, Dulcy Fanny. Dellia Nims, RPVI [facility] Radiology PLAN: The patient will remain intubated, given the critical care status ICU status Target systolic blood pressure of 120-140 Right hip straight time 6 hours Left common femoral  artery monitoring line. Frequent neurovascular checks Repeat neurologic imaging with CT and/MRI at the discretion of neurology team Electronically Signed   By: Corrie Mckusick D.O.   On: 11/04/2020 08:46   CT HEAD CODE STROKE WO CONTRAST  Result Date: 11/03/2020 CLINICAL DATA:  Code stroke. EXAM: CT HEAD WITHOUT CONTRAST TECHNIQUE: Contiguous axial images were obtained from the base of the skull through the vertex without intravenous contrast. COMPARISON:  11/01/2013 FINDINGS: Brain: On coronal reformats there is equivocal cortical indistinctness of the right frontal operculum. No hemorrhage or hydrocephalus. Negative for mass. Patchy remote cortically based infarcts at the left vertex. Vascular: Hyperdense right M1 segment Skull: Normal. Negative for fracture or focal lesion. Sinuses/Orbits: No acute finding. Other: Page to Dr. Lorrin Goodell at 6:24 am on 11/03/2020 by text page via the [facility] messaging system. ASPECTS [facility] Stroke Program Early CT Score) - Ganglionic level infarction (caudate, lentiform nuclei, internal capsule, insula, M1-M3 cortex): 6 or 7 - Supraganglionic infarction (M4-M6 cortex): 3 Total score (0-10 with 10 being normal): 9 or 10 IMPRESSION: 1. Dense right M1 segment suspicious for thrombosis. 2. Equivocal for acute infarct at the right insula/operculum. 3. No acute hemorrhage. 4. 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 Electronically Signed   By: Jorje Guild M.D.   On: 11/03/2020 06:27   VAS US CAROTID  Result Date: 11/04/2020 Carotid Arterial Duplex Study Patient Name:  Christopher Lang  Date of Exam:   11/04/2020 Medical Rec #: [id number]        Accession #:    [id number] Date of Birth: 03/17/1955        Patient Gender: M Patient Age:   29 years Exam Location:  [facility] Procedure:      VAS US CAROTID Referring Phys: Cornelius Moras XU --------------------------------------------------------------------------------  Indications:       CVA. Limitations        Today's  exam was limited due to heavy calcification and the                    resulting shadowing and the patient's inability or                    unwillingness to cooperate. Comparison Study:  11-03-2020 CTA neck showed bilateral severe ICA origin                    stenosis. Performing Technologist: Darlin Coco RDMS, RVT  Examination Guidelines: A complete evaluation includes B-mode imaging, spectral Doppler, color Doppler, and power Doppler as needed of all accessible portions of each vessel. Bilateral testing is considered an integral part of a complete examination. Limited examinations for reoccurring indications may be performed as noted.  Right Carotid Findings: +----------+--------+--------+--------+----------------------+--------+           PSV cm/sEDV cm/sStenosisPlaque Description    Comments +----------+--------+--------+--------+----------------------+--------+ CCA Prox  109     30                                             +----------+--------+--------+--------+----------------------+--------+ CCA Mid   93      27      <50%    calcific and irregular         +----------+--------+--------+--------+----------------------+--------+ CCA Distal108     32              heterogenous                   +----------+--------+--------+--------+----------------------+--------+ ICA Prox  362     148     80-99%  calcific and irregular         +----------+--------+--------+--------+----------------------+--------+ ICA Mid   348     120                                            +----------+--------+--------+--------+----------------------+--------+  ICA [id number]     38                                             +----------+--------+--------+--------+----------------------+--------+ ECA       243     25      >50%    calcific and irregular         +----------+--------+--------+--------+----------------------+--------+  +----------+--------+-------+----------------+-------------------+           PSV cm/sEDV cmsDescribe        Arm Pressure (mmHG) +----------+--------+-------+----------------+-------------------+ [id number]            Multiphasic, WNL                    +----------+--------+-------+----------------+-------------------+ +---------+--------+--+--------+--+---------+ VertebralPSV cm/s95EDV cm/s35Antegrade +---------+--------+--+--------+--+---------+  Left Carotid Findings: +----------+--------+--------+--------+--------------------+-------------------+           PSV cm/sEDV cm/sStenosisPlaque Description  Comments            +----------+--------+--------+--------+--------------------+-------------------+ CCA Prox  83      0                                                       +----------+--------+--------+--------+--------------------+-------------------+ CCA Mid   73      0       <50%    heterogenous and                                                          smooth                                  +----------+--------+--------+--------+--------------------+-------------------+ CCA Distal31      0                                                       +----------+--------+--------+--------+--------------------+-------------------+ ICA Prox                          calcific and        Unable to visualize                                   irregular           due to                                                                    calcification/shado  wing                +----------+--------+--------+--------+--------------------+-------------------+ ICA Mid   88      26                                  Velocities may                                                            underestimate                                                             degree of  stenosis                                                        due to more                                                               proximal                                                                  obstruction.        +----------+--------+--------+--------+--------------------+-------------------+ ICA Distal                                            Unable to evaluate                                                        due to patient                                                            movement/weak  signal              +----------+--------+--------+--------+--------------------+-------------------+ ECA       205     21      >50%    calcific and                                                              irregular                               +----------+--------+--------+--------+--------------------+-------------------+ +----------+--------+--------+----------------+-------------------+           PSV cm/sEDV cm/sDescribe        Arm Pressure (mmHG) +----------+--------+--------+----------------+-------------------+ [id number]             Multiphasic, WNL                    +----------+--------+--------+----------------+-------------------+ +---------+--------+--+--------+--+---------+ VertebralPSV cm/s54EDV cm/s16Antegrade +---------+--------+--+--------+--+---------+   Summary: Right Carotid: Velocities in the right ICA are consistent with a 80-99%                stenosis. The ECA appears >50% stenosed. Left Carotid: The ECA appears >50% stenosed. Unable to evaluate proximal ICA               secondary to severe calcification/acoustic shadowing. Vertebrals:  Bilateral vertebral arteries demonstrate antegrade flow. Subclavians: Normal flow hemodynamics were seen in bilateral subclavian              arteries. *See table(s) above for measurements  and observations.     Preliminary    CT ANGIO HEAD CODE STROKE  Result Date: 11/03/2020 CLINICAL DATA:  Assess intracranial arteries. EXAM: CT ANGIOGRAPHY HEAD AND NECK TECHNIQUE: Multidetector CT imaging of the head and neck was performed using the standard protocol during bolus administration of intravenous contrast. Multiplanar CT image reconstructions and MIPs were obtained to evaluate the vascular anatomy. Carotid stenosis measurements (when applicable) are obtained utilizing NASCET criteria, using the distal internal carotid diameter as the denominator. CONTRAST:  19mL OMNIPAQUE IOHEXOL 350 MG/ML SOLN COMPARISON:  None similar FINDINGS: CTA NECK FINDINGS Aortic arch: Atheromatous plaque.  Three vessel branching. Right carotid system: Primarily calcific atherosclerosis with severe ICA origin stenosis to the degree that the lumen is not accurately measurable. No dissection. Left carotid system: Atherosclerosis with bulky mixed density plaque at the left ICA bulb with severe stenosis causing downstream faint underfilling Vertebral arteries: Proximal subclavian atherosclerosis with up to 60% left origin stenosis. Dominant right vertebral artery with atherosclerotic calcification but wide patency to the dura. Faint intermittent flow in the left vertebral artery. Skeleton: Ordinary cervical spine degeneration Other neck: No acute finding Upper chest: Paraseptal emphysema. Review of the MIP images confirms the above findings CTA HEAD FINDINGS Anterior circulation: Heavily calcified carotid siphons with at least moderate stenosis on the right. TOn the left there is quantification due to underfilling. Right M1 occlusion with faint downstream reconstitution. Major vessels on the left show better patency. Negative for aneurysm Posterior circulation: Vertebral and basilar arteries are widely patent. Fetal type bilateral PCA flow. No PCA branch occlusion. Venous sinuses: Unremarkable Anatomic variants: As above  Review of the MIP images confirms the above findings IMPRESSION: 1. Emergent large vessel occlusion at the right M1 segment. 2.  Severe bilateral ICA origin stenosis with underfilling left. 3. Thready intermittent flow in the non dominant left vertebral artery. 4. 60% left subclavian origin stenosis. Electronically Signed   By: Jorje Guild M.D.   On: 11/03/2020 06:41   CT ANGIO NECK CODE STROKE  Result Date: 11/03/2020 CLINICAL DATA:  Assess intracranial arteries. EXAM: CT ANGIOGRAPHY HEAD AND NECK TECHNIQUE: Multidetector CT imaging of the head and neck was performed using the standard protocol during bolus administration of intravenous contrast. Multiplanar CT image reconstructions and MIPs were obtained to evaluate the vascular anatomy. Carotid stenosis measurements (when applicable) are obtained utilizing NASCET criteria, using the distal internal carotid diameter as the denominator. CONTRAST:  197mL OMNIPAQUE IOHEXOL 350 MG/ML SOLN COMPARISON:  None similar FINDINGS: CTA NECK FINDINGS Aortic arch: Atheromatous plaque.  Three vessel branching. Right carotid system: Primarily calcific atherosclerosis with severe ICA origin stenosis to the degree that the lumen is not accurately measurable. No dissection. Left carotid system: Atherosclerosis with bulky mixed density plaque at the left ICA bulb with severe stenosis causing downstream faint underfilling Vertebral arteries: Proximal subclavian atherosclerosis with up to 60% left origin stenosis. Dominant right vertebral artery with atherosclerotic calcification but wide patency to the dura. Faint intermittent flow in the left vertebral artery. Skeleton: Ordinary cervical spine degeneration Other neck: No acute finding Upper chest: Paraseptal emphysema. Review of the MIP images confirms the above findings CTA HEAD FINDINGS Anterior circulation: Heavily calcified carotid siphons with at least moderate stenosis on the right. TOn the left there is quantification  due to underfilling. Right M1 occlusion with faint downstream reconstitution. Major vessels on the left show better patency. Negative for aneurysm Posterior circulation: Vertebral and basilar arteries are widely patent. Fetal type bilateral PCA flow. No PCA branch occlusion. Venous sinuses: Unremarkable Anatomic variants: As above Review of the MIP images confirms the above findings IMPRESSION: 1. Emergent large vessel occlusion at the right M1 segment. 2. Severe bilateral ICA origin stenosis with underfilling left. 3. Thready intermittent flow in the non dominant left vertebral artery. 4. 60% left subclavian origin stenosis. Electronically Signed   By: Jorje Guild M.D.   On: 11/03/2020 06:41    Labs:  CBC: Recent Labs    11/03/20 0603 11/03/20 0622 11/03/20 1016 11/04/20 0614 11/05/20 0405  WBC 15.1*  --   --  12.3* 9.6  HGB 16.8 17.3* 16.3 13.6 13.0  HCT 47.1 51.0 48.0 39.3 38.0*  PLT 210  --   --  145* 117*    COAGS: Recent Labs    11/03/20 0603  INR 1.0  APTT 39*    BMP: Recent Labs    11/03/20 0603 11/03/20 0622 11/03/20 1016 11/04/20 0614 11/05/20 0405  NA 133* 136 136 135 135  K 3.8 3.8 4.3 3.8 3.6  CL 98 98  --  105 104  CO2 25  --   --  22 23  GLUCOSE 194* 201*  --  121* 101*  BUN 11 13  --  11 15  CALCIUM 10.2  --   --  9.5 9.4  CREATININE 0.93 0.70  --  0.89 0.83  GFRNONAA >60  --   --  >60 >60    LIVER FUNCTION TESTS: Recent Labs    11/03/20 0603  BILITOT 1.8*  AST 83*  ALT 30  ALKPHOS 131*  PROT 7.5  ALBUMIN 4.0    TUMOR MARKERS: No results for input(s): AFPTM, CEA, CA199, CHROMGRNA in the last 8760 hours.  Assessment and Plan:  65  y/o M who presented to [facility] on 10/16 as a code stroke s/p right ICA angioplasty w/o stent placement and right distal M1 thrombectomy in NIR seen today for follow up.  Patient noted to have 80-99% right ICA stenosis on follow up carotid US, unable to assess left ICA due to severe calcification. Imaging reviewed  by Dr. Earleen Newport today and given re-stenosis of previously angioplastied right ICA plan to proceed with right ICA angioplasty and possible stent placement tomorrow (10/19) with general anesthesia. Discussed with patient and wife at bedside who are agreeable. Also discussed smoking cessation with patient, nicotine patch offered.  Risks and benefits of cerebral arteriogram with intervention were discussed with the patient including, but not limited to bleeding, infection, vascular injury, contrast induced renal failure, stroke, reperfusion hemorrhage, or even death.  This interventional procedure involves the use of X-rays and because of the nature of the planned procedure, it is possible that we will have prolonged use of X-ray fluoroscopy. Potential radiation risks to you include (but are not limited to) the following: - A slightly elevated risk for cancer  several years later in life. This risk is typically less than 0.5% percent. This risk is low in comparison to the normal incidence of human cancer, which is 33% for women and 50% for men according to the [employer]. - Radiation induced injury can include skin redness, resembling a rash, tissue breakdown / ulcers and hair loss (which can be temporary or permanent).   The likelihood of either of these occurring depends on the difficulty of the procedure and whether you are sensitive to radiation due to previous procedures, disease, or genetic conditions.  IF your procedure requires a prolonged use of radiation, you will be notified and given written instructions for further action.  It is your responsibility to monitor the irradiated area for the 2 weeks following the procedure and to notify your physician if you are concerned that you have suffered a radiation induced injury.    All of the patient's questions were answered, patient is agreeable to proceed.  Consent signed and in chart.   Thank you for this interesting consult.  I greatly  enjoyed meeting Christopher Lang and look forward to participating in their care.  A copy of this report was sent to the requesting provider on this date.  Electronically Signed: Joaquim Nam, PA-C 11/05/2020, 11:32 AM   I spent a total of 40 Minutes in face to face in clinical consultation, greater than 50% of which was counseling/coordinating care for right ICA stent placement.

## 2020-11-05 NOTE — Progress Notes (Signed)
NAME:  TEREK BEE, MRN:  [id number], DOB:  [birth year], LOS: 2 ADMISSION DATE:  11/03/2020, CONSULTATION DATE:  11/05/2020  REFERRING MD:  Erlinda Hong, stroke , CHIEF COMPLAINT: Acute left-sided weakness and chest pain  History of Present Illness:  65 year old man brought in by EMS complaining of chest pain and noted to have left-sided weakness.  EMS gave him aspirin, arrived as a code stroke EKG on arrival to ED showed anterior STEMI V2 to V6 ST elevation.  He was noted to have left facial droop and neglect Head CT showed dense right M1 segment, remote cortical infarcts on left .  CT angiogram confirmed large vessel occlusion of right M1 segment with severe bilateral ICA origin stenosis and 60% left subclavian stenosis. Seen by cardiology, recommended medical management of anterior STEMI in view of acute stroke.  Given TN K , taken to neuro IR. Angiogram showed high-grade stenosis of right ICA bifurcation, treated by PTA, mechanical thrombectomy of right M1 performed Neo-Synephrine needed during procedure, transferred to ICU intubated with sheath in place and sedated on propofol and on Cleviprex for hypertension  Pertinent  Medical History  Mild aortic stenosis Paroxysmal atrial fibrillation Hypertension OSA COPD  Significant Hospital Events: Including procedures, antibiotic start and stop dates in addition to other pertinent events   10/16-received tPA for right ICA bifurcation stenosis followed by M1 thrombectomy with TICI 3 flow Echocardiogram shows evidence of large LAD territory infarct with anterior wall motion abnormality and EF of 25%. 10/17 weaned off Cleviprex and passed swallow evaluation.  IV heparin started for Afib and large AWMI  Interim History / Subjective:   Weaned off cleviprex yesterday and passed repeat swallow evaluation.  1 ppd smoker - largely cue based.   Objective   Blood pressure (!) 147/61, pulse (!) 48, temperature 97.8 F (36.6 C), temperature source Oral,  resp. rate 18, height 6' (1.829 m), weight 87.6 kg, SpO2 93 %.        Intake/Output Summary (Last 24 hours) at 11/05/2020 0820 Last data filed at 11/05/2020 0700 Gross per 24 hour  Intake 544.8 ml  Output 400 ml  Net 144.8 ml    Filed Weights   11/03/20 0600  Weight: 87.6 kg    Examination: General: Elderly man, lying comfortably in bed HENT: Pupils 3 mm bilaterally equally reactive to light, no JVD, no pallor or icterus Lungs: Bilateral breath sounds clear, no accessory muscle use Cardiovascular: S1-S2 regular, no murmur Abdomen: Soft, nontender Extremities: No deformity, left femoral arterial line, distal pulses weak but able to Doppler Neuro: Awake alert follows commands speech appears somewhat dysarthric.  No focal neurological deficits.  Normal coordination.  Normal sensation.   EKG reviewed -ST elevation anterolateral leads V2 to V6, I and aVL with T wave inversions  Resolved Hospital Problem list     Assessment & Plan:  Critically ill due to concomitant acute coronary syndrome/STEMI with acute right MCA CVA status post thrombectomy, requiring titration of Cleviprex to keep systolic blood pressure less than 140 Ischemic cardiomyopathy with ejection fraction 25% Cerebrovascular disease Paroxysmal atrial fibrillation Obstructive sleep apnea COPD. Tobacco dependency  Plan:  -Secondary CVD prevention :  statin, beta-blocker, start low dose Entresto, Chantix for smoking cessation. - No T2DM but may consider SGLT2i for HF - On Plavix and IV heparin. Transition to Plavix + DOAC per stroke neurology - Counseling regarding cardiac risk modification .  Best Practice (right click and "Reselect all SmartList Selections" daily)   Diet/type: Regular consistency (see orders) DVT  prophylaxis: systemic heparin GI prophylaxis: PPI Lines: N/A Foley:  Yes, and it is still needed Code Status:  full code Last date of multidisciplinary goals of care discussion - Patient and  wife updated 10/18  Labs   CBC: Recent Labs  Lab 11/03/20 0603 11/03/20 0622 11/03/20 1016 11/04/20 0614 11/05/20 0405  WBC 15.1*  --   --  12.3* 9.6  NEUTROABS 13.8*  --   --   --   --   HGB 16.8 17.3* 16.3 13.6 13.0  HCT 47.1 51.0 48.0 39.3 38.0*  MCV 93.8  --   --  97.3 98.4  PLT 210  --   --  145* 117*     Basic Metabolic Panel: Recent Labs  Lab 11/03/20 0603 11/03/20 0622 11/03/20 1016 11/04/20 0614 11/05/20 0405  NA 133* 136 136 135 135  K 3.8 3.8 4.3 3.8 3.6  CL 98 98  --  105 104  CO2 25  --   --  22 23  GLUCOSE 194* 201*  --  121* 101*  BUN 11 13  --  11 15  CREATININE 0.93 0.70  --  0.89 0.83  CALCIUM 10.2  --   --  9.5 9.4    GFR: Estimated Creatinine Clearance: 94.8 mL/min (by C-G formula based on SCr of 0.83 mg/dL). Recent Labs  Lab 11/03/20 0603 11/04/20 0614 11/05/20 0405  WBC 15.1* 12.3* 9.6     Liver Function Tests: Recent Labs  Lab 11/03/20 0603  AST 83*  ALT 30  ALKPHOS 131*  BILITOT 1.8*  PROT 7.5  ALBUMIN 4.0    No results for input(s): LIPASE, AMYLASE in the last 168 hours. No results for input(s): AMMONIA in the last 168 hours.  ABG    Component Value Date/Time   PHART 7.258 (L) 11/03/2020 1016   PCO2ART 60.2 (H) 11/03/2020 1016   PO2ART 88 11/03/2020 1016   HCO3 27.1 11/03/2020 1016   TCO2 29 11/03/2020 1016   ACIDBASEDEF 2.0 11/03/2020 1016   O2SAT 95.0 11/03/2020 1016      Coagulation Profile: Recent Labs  Lab 11/03/20 0603  INR 1.0     Cardiac Enzymes: No results for input(s): CKTOTAL, CKMB, CKMBINDEX, TROPONINI in the last 168 hours.  HbA1C: Hgb A1c MFr Bld  Date/Time Value Ref Range Status  11/03/2020 11:37 AM 4.9 4.8 - 5.6 % Final    Comment:    (NOTE) Pre diabetes:          5.7%-6.4%  Diabetes:              >6.4%  Glycemic control for   <7.0% adults with diabetes   08/15/2019 10:02 AM 5.0 <5.7 % of total Hgb Final    Comment:    For the purpose of screening for the presence  of diabetes: . <5.7%       Consistent with the absence of diabetes 5.7-6.4%    Consistent with increased risk for diabetes             (prediabetes) > or =6.5%  Consistent with diabetes . This assay result is consistent with a decreased risk of diabetes. . Currently, no consensus exists regarding use of hemoglobin A1c for diagnosis of diabetes in children. . According to American Diabetes Association (ADA) guidelines, hemoglobin A1c <7.0% represents optimal control in non-pregnant diabetic patients. Different metrics may apply to specific patient populations.  Standards of Medical Care in Diabetes(ADA). .     CBG: Recent Labs  Lab 11/04/20 1532  11/04/20 1948 11/04/20 2330 11/05/20 0330 11/05/20 0759  GLUCAP 121* 95 104* 109* 135*    CRITICAL CARE Performed by: Kipp Brood   Total critical care time: 35 minutes  Critical care time was exclusive of separately billable procedures and treating other patients.  Critical care was necessary to treat or prevent imminent or life-threatening deterioration.  Critical care was time spent personally by me on the following activities: development of treatment plan with patient and/or surrogate as well as nursing, discussions with consultants, evaluation of patient's response to treatment, examination of patient, obtaining history from patient or surrogate, ordering and performing treatments and interventions, ordering and review of laboratory studies, ordering and review of radiographic studies, pulse oximetry, re-evaluation of patient's condition and participation in multidisciplinary rounds.  Kipp Brood, MD [facility] ICU Physician [employer]  Pager: [phone] Mobile: [phone] After hours: [phone].   11/05/2020

## 2020-11-05 NOTE — TOC Benefit Eligibility Note (Signed)
Patient Teacher, English as a foreign language completed.    The patient is currently admitted and upon discharge could be taking Entresto 24-26 mg.  The current 30 day co-pay is, $9.85.   The patient is currently admitted and upon discharge could be taking Jardiance 10 mg.  The current 30 day co-pay is, $9.85.   The patient is currently admitted and upon discharge could be taking Farxiga 10 mg.  The current 30 day co-pay is, $9.85.   The patient is currently admitted and upon discharge could be taking Xarelto 20 mg.  The current 30 day co-pay is, $9.85.   The patient is currently admitted and upon discharge could be taking Eliquis 5 mg.  The current 30 day co-pay is, $9.85.   The patient is insured through [place], [place] Patient Advocate Specialist [place] Team Direct Number: [phone]  Fax: [phone]

## 2020-11-06 ENCOUNTER — Inpatient Hospital Stay (HOSPITAL_COMMUNITY): Payer: Medicare Other

## 2020-11-06 ENCOUNTER — Inpatient Hospital Stay (HOSPITAL_COMMUNITY): Payer: Medicare Other | Admitting: Certified Registered Nurse Anesthetist

## 2020-11-06 ENCOUNTER — Encounter (HOSPITAL_COMMUNITY)
Admission: EM | Disposition: A | Discharge status: Home Health | Payer: Self-pay | Source: Home / Self Care | Attending: Neurology

## 2020-11-06 HISTORY — PX: RADIOLOGY WITH ANESTHESIA: SHX6223

## 2020-11-06 HISTORY — PX: IR US GUIDE VASC ACCESS RIGHT: IMG2390

## 2020-11-06 HISTORY — PX: IR INTRAVSC STENT CERV CAROTID W/EMB-PROT MOD SED INCL ANGIO: IMG2303

## 2020-11-06 HISTORY — PX: IR ANGIO INTRA EXTRACRAN SEL COM CAROTID INNOMINATE BILAT MOD SED: IMG5360

## 2020-11-06 LAB — CBC
HCT: 37.4 % — ABNORMAL LOW (ref 39.0–52.0)
Hemoglobin: 12.9 g/dL — ABNORMAL LOW (ref 13.0–17.0)
MCH: 33.3 pg (ref 26.0–34.0)
MCHC: 34.5 g/dL (ref 30.0–36.0)
MCV: 96.6 fL (ref 80.0–100.0)
Platelets: 134 10*3/uL — ABNORMAL LOW (ref 150–400)
RBC: 3.87 MIL/uL — ABNORMAL LOW (ref 4.22–5.81)
RDW: 12.2 % (ref 11.5–15.5)
WBC: 9.5 10*3/uL (ref 4.0–10.5)
nRBC: 0 % (ref 0.0–0.2)

## 2020-11-06 LAB — GLUCOSE, CAPILLARY
Glucose-Capillary: 100 mg/dL — ABNORMAL HIGH (ref 70–99)
Glucose-Capillary: 103 mg/dL — ABNORMAL HIGH (ref 70–99)

## 2020-11-06 LAB — BASIC METABOLIC PANEL
Anion gap: 6 (ref 5–15)
BUN: 14 mg/dL (ref 8–23)
CO2: 23 mmol/L (ref 22–32)
Calcium: 8.9 mg/dL (ref 8.9–10.3)
Chloride: 100 mmol/L (ref 98–111)
Creatinine, Ser: 0.8 mg/dL (ref 0.61–1.24)
GFR, Estimated: >60 mL/min (ref >60)
Glucose, Bld: 99 mg/dL (ref 70–99)
Potassium: 2.9 mmol/L — ABNORMAL LOW (ref 3.5–5.1)
Sodium: 129 mmol/L — ABNORMAL LOW (ref 135–145)

## 2020-11-06 LAB — HEPARIN LEVEL (UNFRACTIONATED)
Heparin Unfractionated: <0.1 IU/mL — ABNORMAL LOW (ref 0.30–0.70)
Heparin Unfractionated: <0.1 IU/mL — ABNORMAL LOW (ref 0.30–0.70)

## 2020-11-06 SURGERY — IR WITH ANESTHESIA
Anesthesia: Monitor Anesthesia Care

## 2020-11-06 MED ORDER — PROPOFOL 500 MG/50ML IV EMUL
INTRAVENOUS | Status: DC | PRN
  Administered 2020-11-06: 75 ug/kg/min via INTRAVENOUS

## 2020-11-06 MED ORDER — FENTANYL CITRATE (PF) 100 MCG/2ML IJ SOLN
INTRAMUSCULAR | Status: AC
  Filled 2020-11-06: qty 2

## 2020-11-06 MED ORDER — CLOPIDOGREL BISULFATE 300 MG PO TABS: 300.0000 mg | ORAL_TABLET | Freq: Once | ORAL | Status: AC

## 2020-11-06 MED ORDER — IOHEXOL 300 MG/ML  SOLN
100.0000 mL | Freq: Once | INTRAMUSCULAR | Status: AC | PRN
  Administered 2020-11-06: 30 mL via INTRA_ARTERIAL

## 2020-11-06 MED ORDER — HEPARIN SODIUM (PORCINE) 1000 UNIT/ML IJ SOLN
INTRAMUSCULAR | Status: DC | PRN
  Administered 2020-11-06: 8000 [IU] via INTRAVENOUS

## 2020-11-06 MED ORDER — ESMOLOL HCL 100 MG/10ML IV SOLN
INTRAVENOUS | Status: DC | PRN
  Administered 2020-11-06 (×2): 10 mg via INTRAVENOUS
  Administered 2020-11-06: 20 mg via INTRAVENOUS

## 2020-11-06 MED ORDER — CLEVIDIPINE BUTYRATE 0.5 MG/ML IV EMUL
INTRAVENOUS | Status: AC
  Filled 2020-11-06: qty 50

## 2020-11-06 MED ORDER — CLEVIDIPINE BUTYRATE 0.5 MG/ML IV EMUL
INTRAVENOUS | Status: DC | PRN
  Administered 2020-11-06: 3 mg/h via INTRAVENOUS

## 2020-11-06 MED ORDER — ORAL CARE MOUTH RINSE: 15.0000 mL | Freq: Once | OROMUCOSAL | Status: AC

## 2020-11-06 MED ORDER — PROPOFOL 10 MG/ML IV BOLUS
INTRAVENOUS | Status: DC | PRN
  Administered 2020-11-06 (×2): 20 mg via INTRAVENOUS

## 2020-11-06 MED ORDER — CLOPIDOGREL BISULFATE 300 MG PO TABS
ORAL_TABLET | ORAL | Status: AC
  Administered 2020-11-06: 300 mg via ORAL
  Filled 2020-11-06: qty 1

## 2020-11-06 MED ORDER — CHLORHEXIDINE GLUCONATE 0.12 % MT SOLN: 15.0000 mL | Freq: Once | OROMUCOSAL | Status: AC

## 2020-11-06 MED ORDER — LACTATED RINGERS IV SOLN: INTRAVENOUS | Status: DC

## 2020-11-06 MED ORDER — ESMOLOL HCL 100 MG/10ML IV SOLN
INTRAVENOUS | Status: AC
  Filled 2020-11-06: qty 10

## 2020-11-06 MED ORDER — LACTATED RINGERS IV SOLN: INTRAVENOUS | Status: DC | PRN

## 2020-11-06 MED ORDER — IOHEXOL 300 MG/ML  SOLN
100.0000 mL | Freq: Once | INTRAMUSCULAR | Status: AC | PRN
  Administered 2020-11-06: 75 mL via INTRA_ARTERIAL

## 2020-11-06 MED ORDER — FENTANYL CITRATE (PF) 100 MCG/2ML IJ SOLN
INTRAMUSCULAR | Status: DC | PRN
  Administered 2020-11-06 (×2): 25 ug via INTRAVENOUS

## 2020-11-06 MED ORDER — POTASSIUM CHLORIDE CRYS ER 20 MEQ PO TBCR
40.0000 meq | EXTENDED_RELEASE_TABLET | Freq: Once | ORAL | Status: AC
  Administered 2020-11-06: 40 meq via ORAL
  Filled 2020-11-06: qty 2

## 2020-11-06 MED ORDER — CLEVIDIPINE BUTYRATE 0.5 MG/ML IV EMUL: 0.0000 mg/h | INTRAVENOUS | Status: DC

## 2020-11-06 MED ORDER — LIDOCAINE HCL 1 % IJ SOLN
INTRAMUSCULAR | Status: AC
  Administered 2020-11-06: 6 mL via SUBCUTANEOUS
  Filled 2020-11-06: qty 20

## 2020-11-06 MED ORDER — CHLORHEXIDINE GLUCONATE 0.12 % MT SOLN
OROMUCOSAL | Status: AC
  Administered 2020-11-06: 15 mL via OROMUCOSAL
  Filled 2020-11-06: qty 15

## 2020-11-06 NOTE — Procedures (Signed)
Neuro-Interventional Radiology  Post Cerebral Angiogram/Intervention Procedure Note  Operator:    Dr. Earleen Lang Assistant:   None  History:   Mr Christopher Lang is 65 yo male with history of symptomatic right ICA high grade stenosis, treated as a tandem occlusion/ELVO on Sunday 3 days ago, as well as STEMI.  His repeat duplex on admission shows recurrent right ICA 80%-99% stenosis.    High risk for surgery.   Given his string sign/occlusion of the left ICA, urgent carotid stenting is indicated.    Procedure: US guided right CFA access Right cervical/cerebral angio. Left not performed secondary to safety issues, as patient was moving during the case.   Treatment of right ICA high grade symptomatic stenosis with embolic protection, 73m PTA and 973m- 37m109m 16m12mct stent.   Deployment of 26F CELT for hemostasis.   Findings:   String sign of left ICA. Right to left cross-fill of patent Acomm.   >50% stenosis of the right ICA, >50% calcified. No residual stenosis after stent.   Anesthesia:   Anesthesia. MAC  EBL:    ~100~706CB Complication:  None   Medication:  8000 Units IV heparin, Preop dose of 300mg82mplavix  Recommendations: - OK to raise head of bed. - CELT closure device has indication for immediate ambulation.  - Pressure dressing at the right CFA access site - Goal SBP 120-140.   - Frequent NV checks, ICU overnight - NIR to follow - OK to restart therapeutic heparin ggt at 5:15pm, 2 hours after conclusion - continue single anti-platelet/plavix daily (tomorrow on schedule) with profile of single anti-platelet + AC/DOAC per COMPASS/[facility] trials.  - Follow up with Dr. WagneEarleen Lang-6 weeks after DC with repeat carotid duplex  Signed,  Christopher Lang

## 2020-11-06 NOTE — Transfer of Care (Signed)
Immediate Anesthesia Transfer of Care Note  Patient: Christopher Lang  Procedure(s) Performed: STENTING  Patient Location: ICU  Anesthesia Type:MAC  Level of Consciousness: awake and patient cooperative  Airway & Oxygen Therapy: Patient Spontanous Breathing and Patient connected to face mask oxygen  Post-op Assessment: Report given to RN, Post -op Vital signs reviewed and stable, Patient moving all extremities X 4 and Patient able to stick tongue midline  Post vital signs: Reviewed and stable  Last Vitals:  Vitals Value Taken Time  BP 135/66 11/06/20 1600  Temp    Pulse 79 11/06/20 1606  Resp 19 11/06/20 1605  SpO2 96 % 11/06/20 1606  Vitals shown include unvalidated device data.  Last Pain:  Vitals:   11/06/20 1251  TempSrc: Oral  PainSc:          Complications: No notable events documented.

## 2020-11-06 NOTE — Progress Notes (Signed)
SLP Cancellation Note  Patient Details Name: Christopher Lang MRN: [id number] DOB: Oct 21, 1955   Cancelled treatment:       Reason Eval/Treat Not Completed: Acknowledged new bedside swallow evaluation order. RN reporting new s/sx of aspiration since previous SLP swallow eval 10/17. Pt is NPO for procedure at this time. Will f/u as able to assess change in swallow function.     Ellwood Dense, [place], [place] Acute Rehabilitation Services Office Number: [phone]  Christopher Lang 11/06/2020, 12:37 PM

## 2020-11-06 NOTE — Progress Notes (Signed)
eLink Physician-Brief Progress Note Patient Name: KOY LAMP DOB: 1955-06-25 MRN: [id number]   Date of Service  11/06/2020  HPI/Events of Note  K 2.9  eICU Interventions  Ordered PO replacement     Intervention Category Minor Interventions: Electrolytes abnormality - evaluation and management  Tilden Dome 11/06/2020, 6:50 AM

## 2020-11-06 NOTE — Progress Notes (Addendum)
STROKE TEAM PROGRESS NOTE   INTERVAL HISTORY Wife is at the bedside.  Pt neuro stable, no acute event overnight. Pending right ICA stenting today.   Vitals:   11/06/20 0600 11/06/20 0700 11/06/20 0721 11/06/20 0800  BP: 118/78 (!) 127/55  130/83  Pulse: 72 (!) 58  (!) 52  Resp: 14 17  17   Temp:   (!) 97.5 F (36.4 C)   TempSrc:   Axillary   SpO2: 95% 96%  93%  Weight:      Height:       CBC:  Recent Labs  Lab 11/03/20 0603 11/03/20 0622 11/05/20 0405 11/06/20 0425  WBC 15.1*   < > 9.6 9.5  NEUTROABS 13.8*  --   --   --   HGB 16.8   < > 13.0 12.9*  HCT 47.1   < > 38.0* 37.4*  MCV 93.8   < > 98.4 96.6  PLT 210   < > 117* 134*   < > = values in this interval not displayed.   Basic Metabolic Panel:  Recent Labs  Lab 11/05/20 0405 11/06/20 0425  NA 135 129*  K 3.6 2.9*  CL 104 100  CO2 23 23  GLUCOSE 101* 99  BUN 15 14  CREATININE 0.83 0.80  CALCIUM 9.4 8.9   Lipid Panel:  Recent Labs  Lab 11/04/20 0614  CHOL 159  TRIG 187*  HDL 31*  CHOLHDL 5.1  VLDL 37  LDLCALC 91    HgbA1c:  Recent Labs  Lab 11/03/20 1137  HGBA1C 4.9   Urine Drug Screen: No results for input(s): LABOPIA, COCAINSCRNUR, LABBENZ, AMPHETMU, THCU, LABBARB in the last 168 hours.  Alcohol Level No results for input(s): ETH in the last 168 hours.  IMAGING past 24 hours No results found.  PHYSICAL EXAM  Temp:  [97.5 F (36.4 C)-98.9 F (37.2 C)] 97.5 F (36.4 C) (10/19 0721) Pulse Rate:  [44-72] 52 (10/19 0800) Resp:  [14-22] 17 (10/19 0800) BP: (102-159)/(55-118) 130/83 (10/19 0800) SpO2:  [86 %-98 %] 93 % (10/19 0800)  General - Well nourished, well developed, not in acute distress, mild lethargy  Ophthalmologic - fundi not visualized due to noncooperation.  Cardiovascular - Regular rate and rhythm.  Neuro - awake, alert, eyes open, mild lethargy, orientated to age, place, time. No aphasia, mild dysarthria, following all simple commands. Able to name and repeat. No gaze  palsy, tracking bilaterally, visual field full, PERRL. Slight left nasolabial fold flattening. Tongue midline. BUEs and BLEs equal strength symmetrical. Sensation decreased on the left UE and LE, b/l FTN intact grossly, gait not tested.    ASSESSMENT/PLAN RAYVION STUMPH is a 65 y.o. male with PMH significant for glaucoma, COPD, smoker, HLD, OSA, who presents with CP, nausea, dry heaving, and sudden onset left sided weakness. Found to have anterior STEMI and right MCA occlusion.   Stroke:  right MCA infarct due to right M1 occlusion s/p TNK and IR with TICI3 revascularization, embolic secondary to acute STEMI CT head right M1 hyperdense sign  CTA head & neck right M1 occlusion, severe b/l ICA origin stenosis with underfilling on the left, mod to severe stenosis b/l ICA siphon, 60% left subclavian origin stenosis.  Cerebral angio s/p TICI3 of right M1  MRI Patchy acute cerebral infarcts on the right more than left with right-sided petechial hemorrhage. MRA right MCA patent now. No detected flow in the intracranial left ICA but well compensated by the left posterior communicating artery. Chronic critical stenosis  with underfilling of the left ICA bulb. CUS right ICA 80-99% stenosis, left ICA hard to measure due to shadowing  2D Echo EF 20-25%. The mid to distal septum/anterior segments are akinetic. The entire apex is akinetic. The myocardium appears thin. Suspect these findings represent a large wrap around LAD infarction (troponin >24,000). No LV thrombus on contrast imaging. LDL 77 HgbA1c 5.0 VTE prophylaxis - SCDs aspirin 81 mg daily and pletal 100 bid prior to admission, now on heparin IV and plavix Therapy recommendations:  CIR Disposition:  pending  STEMI Cardiomyopathy  Anteriorlateral MI / STEMI on EKG Trop > 24,000 2D Echo EF 20-25%. The mid to distal septum/anterior segments are akinetic. The entire apex is akinetic. The myocardium appears thin. Suspect these findings represent a  large wrap around LAD infarction (troponin >24,000). No LV thrombus on contrast imaging. Cardiology on board, appreciate help On heparin IV and plavix On metoprolol 25mg  bid->12.5mg  bid due to brady  Carotid stenosis CTA head and neck - evere b/l ICA origin stenosis with underfilling on the left MRA No detected flow in the intracranial left ICA but well compensated by the left posterior communicating artery. Chronic critical stenosis with underfilling of the left ICA bulb. CUS right ICA 80-99% stenosis, left ICA hard to measure due to shadowing On heparin IV and plavix now Discussed with Dr. Earleen Newport, will plan for right ICA stenting today  Hypertension Hypotension  Home meds:  cardizem Initial hypotension -> hypertension  Off Neo and cleviprex On metoprolol 25 bid->12.5 bid Stable now at 140s Avoid low BP Long-term BP goal 130-150 given b/l carotid stenosis  Hyperlipidemia Home meds:  lipitor 10 LDL 77, goal < 70 Increase lipitor to 40  Continue statin at discharge  Tobacco abuse Current smoker Smoking cessation counseling will be provided  Other Stroke Risk Factors Advanced Age >/= 55  Obstructive sleep apnea, on CPAP at home Congestive heart failure  Other Active Problems Leukocytosis WBC 17.3->12.3->9.5 COPD  Hospital day # 3  This patient is critically ill due to STEMI, right MCA occlusion s/p TNK and IR, b/l ICA high grade stenosis, cardiomyopathy and at significant risk of neurological worsening, death form heart failure, cardiac arrest, hemorrhagic conversion, recurrent stroke. This patient's care requires constant monitoring of vital signs, hemodynamics, respiratory and cardiac monitoring, review of multiple databases, neurological assessment, discussion with family, other specialists and medical decision making of high complexity. I spent 30 minutes of neurocritical care time in the care of this patient.   Rosalin Hawking, MD PhD Stroke Neurology 11/06/2020 11:43  AM   To contact Stroke Continuity provider, please refer to [email or web address] After hours, contact General Neurology

## 2020-11-06 NOTE — Progress Notes (Signed)
PT Cancellation Note  Patient Details Name: Christopher Lang MRN: [id number] DOB: Jun 16, 1955   Cancelled Treatment:    Reason Eval/Treat Not Completed: Patient at procedure or test/unavailable Patient off unit. Will re-attempt as time allows.   Rumi Taras A. Gilford Rile PT, DPT Acute Rehabilitation Services Pager [phone] Office [phone]    Linna Hoff 11/06/2020, 1:52 PM

## 2020-11-06 NOTE — H&P (Signed)
HPI:  The patient has had a H&P performed within the last 30 days, all history, medications, and exam have been reviewed. The patient denies any interval changes since the H&P.  Medications: Prior to Admission medications   Medication Sig Start Date End Date Taking? Authorizing Provider  aspirin EC 81 MG tablet Take 81 mg by mouth daily.   Yes [provider]  atorvastatin (LIPITOR) 10 MG tablet Take 1 tablet (10 mg total) by mouth daily at 6 PM. 03/09/19  Yes Dettinger, Fransisca Kaufmann, MD  cholecalciferol (VITAMIN D) 1000 units tablet Take 1,000 Units by mouth daily.   Yes [provider]  cilostazol (PLETAL) 100 MG tablet TAKE 1 TABLET BY MOUTH TWICE A DAY Patient taking differently: Take 100 mg by mouth 2 (two) times daily. 05/20/20  Yes Verta Ellen., NP  diltiazem (CARDIZEM CD) 240 MG 24 hr capsule TAKE 1 CAPSULE BY MOUTH EVERY DAY Patient taking differently: Take 240 mg by mouth daily. 05/20/20  Yes Satira Sark, MD  fish oil-omega-3 fatty acids 1000 MG capsule Take 1,000 mg by mouth daily.     Yes [provider]  pantoprazole (PROTONIX) 40 MG tablet Take 1 tablet (40 mg total) by mouth daily before breakfast. Patient not taking: No sig reported 03/09/19   Dettinger, Fransisca Kaufmann, MD     Vital Signs: BP 130/83 (BP Location: Left Arm)   Pulse (!) 52   Temp (!) 97.5 F (36.4 C) (Axillary)   Resp 17   Ht 6' (1.829 m)   Wt 193 lb 2 oz (87.6 kg)   SpO2 93%   BMI 26.19 kg/m   Physical Exam Vitals reviewed.  Constitutional:      Appearance: Normal appearance.  HENT:     Head: Normocephalic and atraumatic.     Mouth/Throat:     Mouth: Mucous membranes are dry.     Pharynx: Oropharynx is clear.  Cardiovascular:     Rate and Rhythm: Regular rhythm. Bradycardia present.     Pulses: Normal pulses.  Pulmonary:     Effort: Pulmonary effort is normal.     Breath sounds: Wheezing present.     Comments: Inspiratory/expiratory wheezing in all  fields Abdominal:     General: Bowel sounds are normal. There is no distension.     Palpations: Abdomen is soft.     Tenderness: There is no abdominal tenderness. There is no guarding.  Musculoskeletal:     Right lower leg: No edema.     Left lower leg: No edema.  Skin:    General: Skin is warm and dry.  Neurological:     Mental Status: He is alert and oriented to person, place, and time.  Psychiatric:        Mood and Affect: Mood normal.        Behavior: Behavior normal.        Thought Content: Thought content normal.        Judgment: Judgment normal.    Mallampati Score:  MD Evaluation Airway: WNL Heart: WNL Abdomen: WNL ASA  Classification: 3 Mallampati/Airway Score: Two  Labs:  CBC: Recent Labs    11/03/20 0603 11/03/20 0622 11/03/20 1016 11/04/20 0614 11/05/20 0405 11/06/20 0425  WBC 15.1*  --   --  12.3* 9.6 9.5  HGB 16.8   < > 16.3 13.6 13.0 12.9*  HCT 47.1   < > 48.0 39.3 38.0* 37.4*  PLT 210  --   --  145* 117* 134*   < > =  values in this interval not displayed.    COAGS: Recent Labs    11/03/20 0603  INR 1.0  APTT 39*    BMP: Recent Labs    11/03/20 0603 11/03/20 0622 11/03/20 1016 11/04/20 0614 11/05/20 0405 11/06/20 0425  NA 133* 136 136 135 135 129*  K 3.8 3.8 4.3 3.8 3.6 2.9*  CL 98 98  --  105 104 100  CO2 25  --   --  22 23 23   GLUCOSE 194* 201*  --  121* 101* 99  BUN 11 13  --  11 15 14   CALCIUM 10.2  --   --  9.5 9.4 8.9  CREATININE 0.93 0.70  --  0.89 0.83 0.80  GFRNONAA >60  --   --  >60 >60 >60    LIVER FUNCTION TESTS: Recent Labs    11/03/20 0603  BILITOT 1.8*  AST 83*  ALT 30  ALKPHOS 131*  PROT 7.5  ALBUMIN 4.0    Assessment/Plan:   Pt resting quietly in bed with wife at bedside sleeping in recliner.  He is A&O, calm and pleasant. He is in no distress. Pt denies pain, CP, SOB, fever, chills or other complaints.  VSS Today's labs: Na++ 129, K+ 2.9 Pt states that he is NPO per order. Pt and wife had no  questions about procedure today.  Plan to proceed to IR today for right ICA angioplasty with stent placement.    Signed: Tyson Alias 11/06/2020, 9:06 AM

## 2020-11-06 NOTE — Progress Notes (Signed)
[facility] for heparin Indication: chest pain/ACS  No Known Allergies  Patient Measurements: Height: 6' (182.9 cm) Weight: 93 kg (205 lb) IBW/kg (Calculated) : 77.6 Heparin Dosing Weight: 87.6  Vital Signs: Temp: 98.9 F (37.2 C) (10/19 1645) Temp Source: Oral (10/19 1645) BP: 136/71 (10/19 1715) Pulse Rate: 76 (10/19 1730)  Labs: Recent Labs     0000 11/03/20 2227 11/04/20 0614 11/04/20 2217 11/05/20 0405 11/06/20 0425 11/06/20 0716  HGB   < >  --  13.6  --  13.0 12.9*  --   HCT  --   --  39.3  --  38.0* 37.4*  --   PLT  --   --  145*  --  117* 134*  --   HEPARINUNFRC  --   --   --    < > 0.35 <0.10* <0.10*  CREATININE  --   --  0.89  --  0.83 0.80  --   TROPONINIHS  --  >24,000*  --   --   --   --   --    < > = values in this interval not displayed.     Estimated Creatinine Clearance: 98.3 mL/min (by C-G formula based on SCr of 0.8 mg/dL).   Assessment: 48 YOM who presented with a R MCA stroke who was found to have a STEMI. Pharmacy consulted to start IV heparin for ACS/STEMI.   Currently on IV heparin at 1150 units/hr. A repeat HL this AM remains undetectable. Per RN, no issues with the IV site and no s/s of overt bleeding anywhere. Pt is scheduled for IR this AM with plans to stop IV heparin at 10 AM.   Now s/p IR procedure and heparin resumed at 5:15 pm, since heparin level undetectable this morning will increase rate  Goal of Therapy:  Heparin level 0.3-0.5 units/ml Monitor platelets by anticoagulation protocol: Yes   Plan:  Increase heparin to 1300 units / hr 8 hour heparin level   Thank you Anette Guarneri, PharmD Please refer to [facility] for unit-specific pharmacist

## 2020-11-06 NOTE — Sedation Documentation (Signed)
Right groin sheath removed, Celt closure device deployed to right groin by Dr. Earleen Newport.

## 2020-11-06 NOTE — Anesthesia Procedure Notes (Addendum)
Date/Time: 11/06/2020 2:18 PM Performed by: Ardyth Harps, CRNA Pre-anesthesia Checklist: Patient identified, Emergency Drugs available, Suction available, Patient being monitored and Timeout performed Patient Re-evaluated:Patient Re-evaluated prior to induction Oxygen Delivery Method: Simple face mask

## 2020-11-06 NOTE — Progress Notes (Signed)
Speech Language Pathology Treatment: Cognitive-Linquistic  Patient Details Name: Christopher Lang MRN: [id number] DOB: 04-13-1955 Today's Date: 11/06/2020 Time: 1208-1226 SLP Time Calculation (min) (ACUTE ONLY): 18 min  Assessment / Plan / Recommendation Clinical Impression  Pt seen for skilled treatment this date with focus on improving speech intelligibility related to dysarthria. Wife, present in room, states that pt's speech remains different from baseline. During simple conversation, pt's articulatory precision is poor with intelligibility further reduced by fast rate of speech and low vocal intensity. After education and training in over articulation, slow rate of speech and increase of vocal intensity, pt produced sentences in structured task with 75% intelligibility. He required consistent verbal and demonstration cues to implement strategies throughout task. Transport arrived to take pt to procedure and session terminated. SLP to f/u for speech/cognitive treatment.   HPI HPI: 65 year old man brought in by EMS complaining of chest pain and noted to have left-sided weakness.   Head CT showed dense right M1 segment, remote cortical infarcts on left .  CT angiogram confirmed large vessel occlusion of right M1 segment with severe bilateral ICA origin stenosis. Angiogram showed high-grade stenosis of right ICA bifurcation, treated by PTA, mechanical thrombectomy of right M1 performed. Intubated for procedure.      SLP Plan  Continue with current plan of care      Recommendations for follow up therapy are one component of a multi-disciplinary discharge planning process, led by the attending physician.  Recommendations may be updated based on patient status, additional functional criteria and insurance authorization.    Recommendations                   Follow up Recommendations: Inpatient Rehab SLP Visit Diagnosis: Dysarthria and anarthria (R47.1) Plan: Continue with current plan of  care       Christopher Lang, Christopher Lang, Christopher Lang Office Number: [phone]  Christopher Lang  11/06/2020, 12:30 PM

## 2020-11-06 NOTE — Anesthesia Preprocedure Evaluation (Addendum)
Anesthesia Evaluation  Patient identified by MRN, date of birth, ID band Patient awake    Reviewed: Allergy & Precautions, NPO status , Patient's Chart, lab work & pertinent test results  Airway Mallampati: I  TM Distance: >3 FB Neck ROM: Full    Dental  (+) Edentulous Upper, Edentulous Lower   Pulmonary sleep apnea , COPD, Current Smoker and Patient abstained from smoking.,    breath sounds clear to auscultation       Cardiovascular hypertension, + Past MI  + dysrhythmias Atrial Fibrillation  Rhythm:Regular Rate:Normal  Echo:  1. The mid to distal septum/anterior segments are akinetic. The entire  apex is akinetic. The myocardium appears thin. Suspect these findings  represent a large wrap around LAD infarction (troponin >24,000). No LV  thrombus on contrast imaging. There is  severe LV dysfunction, EF 20-25%. Left ventricular ejection fraction, by  estimation, is 20 to 25%. The left ventricle has severely decreased  function. The left ventricle demonstrates regional wall motion  abnormalities (see scoring diagram/findings for  description). There is mild left ventricular hypertrophy. Left ventricular  diastolic parameters are consistent with Grade I diastolic dysfunction  (impaired relaxation).  2. Right ventricular systolic function is normal. The right ventricular  size is normal. Tricuspid regurgitation signal is inadequate for assessing  PA pressure.  3. The mitral valve is grossly normal. Trivial mitral valve  regurgitation. No evidence of mitral stenosis.  4. The aortic valve is tricuspid. There is moderate calcification of the  aortic valve. Aortic valve regurgitation is not visualized. Mild to  moderate aortic valve sclerosis/calcification is present, without any  evidence of aortic stenosis.  5. The inferior vena cava is normal in size with <50% respiratory  variability, suggesting right atrial pressure of 8  mmHg.    Neuro/Psych CVA negative psych ROS   GI/Hepatic negative GI ROS, Neg liver ROS,   Endo/Other  negative endocrine ROS  Renal/GU negative Renal ROS     Musculoskeletal negative musculoskeletal ROS (+)   Abdominal Normal abdominal exam  (+)   Peds  Hematology negative hematology ROS (+)   Anesthesia Other Findings   Reproductive/Obstetrics                            Anesthesia Physical Anesthesia Plan  ASA: 4  Anesthesia Plan: MAC   Post-op Pain Management:    Induction: Intravenous  PONV Risk Score and Plan: 0 and Propofol infusion  Airway Management Planned: Natural Airway and Simple Face Mask  Additional Equipment: Arterial line  Intra-op Plan:   Post-operative Plan:   Informed Consent: I have reviewed the patients History and Physical, chart, labs and discussed the procedure including the risks, benefits and alternatives for the proposed anesthesia with the patient or authorized representative who has indicated his/her understanding and acceptance.       Plan Discussed with: CRNA  Anesthesia Plan Comments:        Anesthesia Quick Evaluation

## 2020-11-06 NOTE — Progress Notes (Signed)
[facility] for heparin Indication: chest pain/ACS  No Known Allergies  Patient Measurements: Height: 6' (182.9 cm) Weight: 87.6 kg (193 lb 2 oz) IBW/kg (Calculated) : 77.6 Heparin Dosing Weight: 87.6  Vital Signs: Temp: 97.5 F (36.4 C) (10/19 0721) Temp Source: Axillary (10/19 0721) BP: 130/83 (10/19 0800) Pulse Rate: 52 (10/19 0800)  Labs: Recent Labs    11/03/20 1423 11/03/20 2227 11/04/20 0614 11/04/20 2217 11/05/20 0405 11/06/20 0425 11/06/20 0716  HGB  --   --  13.6  --  13.0 12.9*  --   HCT  --   --  39.3  --  38.0* 37.4*  --   PLT  --   --  145*  --  117* 134*  --   HEPARINUNFRC  --   --   --    < > 0.35 <0.10* <0.10*  CREATININE  --   --  0.89  --  0.83 0.80  --   TROPONINIHS >24,000* >24,000*  --   --   --   --   --    < > = values in this interval not displayed.     Estimated Creatinine Clearance: 98.3 mL/min (by C-G formula based on SCr of 0.8 mg/dL).   Assessment: 89 YOM who presented with a R MCA stroke who was found to have a STEMI. Pharmacy consulted to start IV heparin for ACS/STEMI.   Currently on IV heparin at 1150 units/hr. A repeat HL this AM remains undetectable. Per RN, no issues with the IV site and no s/s of overt bleeding anywhere. Pt is scheduled for IR this AM with plans to stop IV heparin at 10 AM.   Goal of Therapy:  Heparin level 0.3-0.5 units/ml Monitor platelets by anticoagulation protocol: Yes   Plan:  Continue heparin for another hour and stop at 10 AM as instructed per IR. Will plan to resume heparin at increased rate s/p procedure.   Albertina Parr, PharmD., BCPS, BCCCP Clinical Pharmacist Please refer to [facility] for unit-specific pharmacist

## 2020-11-07 ENCOUNTER — Encounter (HOSPITAL_COMMUNITY): Payer: Self-pay | Admitting: Interventional Radiology

## 2020-11-07 DIAGNOSIS — I2102 ST elevation (STEMI) myocardial infarction involving left anterior descending coronary artery: Secondary | ICD-10-CM | POA: Diagnosis not present

## 2020-11-07 DIAGNOSIS — I639 Cerebral infarction, unspecified: Secondary | ICD-10-CM | POA: Diagnosis not present

## 2020-11-07 DIAGNOSIS — I10 Essential (primary) hypertension: Secondary | ICD-10-CM | POA: Diagnosis not present

## 2020-11-07 DIAGNOSIS — I5021 Acute systolic (congestive) heart failure: Secondary | ICD-10-CM | POA: Diagnosis not present

## 2020-11-07 LAB — BASIC METABOLIC PANEL
Anion gap: 8 (ref 5–15)
BUN: 11 mg/dL (ref 8–23)
CO2: 23 mmol/L (ref 22–32)
Calcium: 9.2 mg/dL (ref 8.9–10.3)
Chloride: 104 mmol/L (ref 98–111)
Creatinine, Ser: 0.76 mg/dL (ref 0.61–1.24)
GFR, Estimated: >60 mL/min (ref >60)
Glucose, Bld: 90 mg/dL (ref 70–99)
Potassium: 3.4 mmol/L — ABNORMAL LOW (ref 3.5–5.1)
Sodium: 135 mmol/L (ref 135–145)

## 2020-11-07 LAB — POCT ACTIVATED CLOTTING TIME
Activated Clotting Time: 185 seconds
Activated Clotting Time: 98 seconds

## 2020-11-07 LAB — CBC
HCT: 37.1 % — ABNORMAL LOW (ref 39.0–52.0)
Hemoglobin: 12.8 g/dL — ABNORMAL LOW (ref 13.0–17.0)
MCH: 33.8 pg (ref 26.0–34.0)
MCHC: 34.5 g/dL (ref 30.0–36.0)
MCV: 97.9 fL (ref 80.0–100.0)
Platelets: 129 10*3/uL — ABNORMAL LOW (ref 150–400)
RBC: 3.79 MIL/uL — ABNORMAL LOW (ref 4.22–5.81)
RDW: 12.1 % (ref 11.5–15.5)
WBC: 9.3 10*3/uL (ref 4.0–10.5)
nRBC: 0 % (ref 0.0–0.2)

## 2020-11-07 LAB — HEPARIN LEVEL (UNFRACTIONATED)
Heparin Unfractionated: 0.11 IU/mL — ABNORMAL LOW (ref 0.30–0.70)
Heparin Unfractionated: 0.12 IU/mL — ABNORMAL LOW (ref 0.30–0.70)

## 2020-11-07 MED ORDER — HYDRALAZINE HCL 20 MG/ML IJ SOLN: 5.0000 mg | INTRAMUSCULAR | Status: DC | PRN

## 2020-11-07 MED ORDER — APIXABAN 5 MG PO TABS
5.0000 mg | ORAL_TABLET | Freq: Two times a day (BID) | ORAL | Status: DC
  Administered 2020-11-07 – 2020-11-08 (×2): 5 mg via ORAL
  Filled 2020-11-07 (×2): qty 1

## 2020-11-07 MED ORDER — OXYCODONE HCL 5 MG PO TABS
ORAL_TABLET | ORAL | Status: AC
  Filled 2020-11-07: qty 1

## 2020-11-07 NOTE — Progress Notes (Signed)
Referring Physician(s): Code stroke  Supervising Physician: Corrie Mckusick  Patient Status:  [facility] - In-pt  Chief Complaint: Follow up right ICA stent placement  Subjective:  Patient seen in his room, wife at bedside. He reports that he's feeling well and looking forward to going home which he is hopeful will be soon, he is waiting for a floor bed so he can get out of the ICU. He denies any headache, vision changes, n/v, chest pain, abd pain. He continues to have some trouble with swallowing liquids - feels like they get stuck in his mouth and he can't push them down, but seems to be a little better today. He also reports that the numbness in his legs and right hand have improved.   Allergies: Patient has no known allergies.  Medications: Prior to Admission medications   Medication Sig Start Date End Date Taking? Authorizing Provider  aspirin EC 81 MG tablet Take 81 mg by mouth daily.   Yes [provider]  atorvastatin (LIPITOR) 10 MG tablet Take 1 tablet (10 mg total) by mouth daily at 6 PM. 03/09/19  Yes Dettinger, Fransisca Kaufmann, MD  cholecalciferol (VITAMIN D) 1000 units tablet Take 1,000 Units by mouth daily.   Yes [provider]  cilostazol (PLETAL) 100 MG tablet TAKE 1 TABLET BY MOUTH TWICE A DAY Patient taking differently: Take 100 mg by mouth 2 (two) times daily. 05/20/20  Yes Verta Ellen., NP  diltiazem (CARDIZEM CD) 240 MG 24 hr capsule TAKE 1 CAPSULE BY MOUTH EVERY DAY Patient taking differently: Take 240 mg by mouth daily. 05/20/20  Yes Satira Sark, MD  fish oil-omega-3 fatty acids 1000 MG capsule Take 1,000 mg by mouth daily.     Yes [provider]  pantoprazole (PROTONIX) 40 MG tablet Take 1 tablet (40 mg total) by mouth daily before breakfast. Patient not taking: No sig reported 03/09/19   Dettinger, Fransisca Kaufmann, MD     Vital Signs: BP 140/73 (BP Location: Right Arm)   Pulse 80   Temp (!) 97.5 F (36.4 C)   Resp 15   Ht 6' (1.829 m)    Wt 205 lb (93 kg)   SpO2 100%   BMI 27.80 kg/m   Physical Exam Vitals and nursing note reviewed.  Constitutional:      General: He is not in acute distress. HENT:     Head: Normocephalic.  Cardiovascular:     Rate and Rhythm: Normal rate.     Comments: Right CFA puncture site clean, dry, dressed appropriately. Soft, mildly tender to palpation as expected, non pulsatile. Pulmonary:     Effort: Pulmonary effort is normal.  Skin:    General: Skin is warm and dry.  Neurological:     Mental Status: He is alert. Mental status is at baseline.  Psychiatric:        Mood and Affect: Mood normal.        Behavior: Behavior normal.        Thought Content: Thought content normal.        Judgment: Judgment normal.    Imaging: MR ANGIO HEAD WO CONTRAST  Result Date: 11/04/2020 CLINICAL DATA:  Stroke follow-up EXAM: MRI HEAD WITHOUT CONTRAST MRA HEAD WITHOUT CONTRAST TECHNIQUE: Multiplanar, multi-echo pulse sequences of the brain and surrounding structures were acquired without intravenous contrast. Angiographic images of the Circle of Willis were acquired using MRA technique without intravenous contrast. COMPARISON:  No pertinent prior exam. FINDINGS: MRI HEAD FINDINGS Brain:  Patchy acute cortical infarct along the right MCA and border zone territory but also seen to a lesser extent in the white matter and striatum on the right. Less extensive acute infarcts in the lateral left frontal lobe, MCA territory. Pre-existing cortical infarcts asymmetric to the left. Petechial hemorrhage on the right and hemosiderin staining on the left. No hydrocephalus, collection, or shift Vascular: Absent left ICA flow void Skull and upper cervical spine: Normal marrow signal Sinuses/Orbits: Negative MRA HEAD FINDINGS Anterior circulation: Faint flow related signal in the left cervical ICA but none seen at the skull base or intracranially until ramification by the left posterior communicating artery. The right M1 and  M2 branches are widely patent. No new embolus. Posterior circulation: Dominant right vertebral artery. The distal basilar divides at the level of the superior cerebellar arteries. No branch occlusion or aneurysm Anatomic variants: None significant IMPRESSION: 1. Patchy acute cerebral infarcts on the right more than left with right-sided petechial hemorrhage. 2. No residual or recurrent right MCA thrombus. 3. No detected flow in the intracranial left ICA but well compensated by the left posterior communicating artery. Chronic critical stenosis with underfilling of the left ICA bulb. Electronically Signed   By: Jorje Guild M.D.   On: 11/04/2020 08:09   MR BRAIN WO CONTRAST  Result Date: 11/04/2020 CLINICAL DATA:  Stroke follow-up EXAM: MRI HEAD WITHOUT CONTRAST MRA HEAD WITHOUT CONTRAST TECHNIQUE: Multiplanar, multi-echo pulse sequences of the brain and surrounding structures were acquired without intravenous contrast. Angiographic images of the Circle of Willis were acquired using MRA technique without intravenous contrast. COMPARISON:  No pertinent prior exam. FINDINGS: MRI HEAD FINDINGS Brain: Patchy acute cortical infarct along the right MCA and border zone territory but also seen to a lesser extent in the white matter and striatum on the right. Less extensive acute infarcts in the lateral left frontal lobe, MCA territory. Pre-existing cortical infarcts asymmetric to the left. Petechial hemorrhage on the right and hemosiderin staining on the left. No hydrocephalus, collection, or shift Vascular: Absent left ICA flow void Skull and upper cervical spine: Normal marrow signal Sinuses/Orbits: Negative MRA HEAD FINDINGS Anterior circulation: Faint flow related signal in the left cervical ICA but none seen at the skull base or intracranially until ramification by the left posterior communicating artery. The right M1 and M2 branches are widely patent. No new embolus. Posterior circulation: Dominant right  vertebral artery. The distal basilar divides at the level of the superior cerebellar arteries. No branch occlusion or aneurysm Anatomic variants: None significant IMPRESSION: 1. Patchy acute cerebral infarcts on the right more than left with right-sided petechial hemorrhage. 2. No residual or recurrent right MCA thrombus. 3. No detected flow in the intracranial left ICA but well compensated by the left posterior communicating artery. Chronic critical stenosis with underfilling of the left ICA bulb. Electronically Signed   By: Jorje Guild M.D.   On: 11/04/2020 08:09   IR INTRAVSC STENT CERV CAROTID W/EMB-PROT MOD SED  Result Date: 11/06/2020 INDICATION: 65 year old male with a history of symptomatic high-grade right carotid stenosis, high risk for surgery given cardiac issues EXAM: ULTRASOUND-GUIDED ACCESS RIGHT COMMON FEMORAL ARTERY RIGHT CAROTID STENT WITH EMBOLIC PROTECTION MEDICATIONS: 8000 UNITS IV HEPARIN. 300 mg p.o. Plavix ANESTHESIA/SEDATION: The anesthesia team was present to provide MAC anesthesia and for patient monitoring during the procedure. Left radial arterial line was performed by the anesthesia team. Interventional neuro radiology nursing staff was also present. FLUOROSCOPY TIME:  Fluoroscopy Time: 6 minutes 42 seconds (491 mGy). COMPLICATIONS:  None TECHNIQUE: Informed written consent was obtained from the patient and the patient's family after a thorough discussion of the procedural risks, benefits and alternatives. Specific risks discussed include: Bleeding, infection, contrast reaction, kidney injury/failure, need for further procedure/surgery, arterial injury or dissection, embolization to new territory, revascularization syndrome, neurologic deterioration, cardiopulmonary collapse, death. All questions were addressed. Maximal Sterile Barrier Technique was utilized including during the procedure including caps, mask, sterile gowns, sterile gloves, sterile drape, hand hygiene and skin  antiseptic. A timeout was performed prior to the initiation of the procedure. The anesthesia team was present to provide general endotracheal tube anesthesia and for patient monitoring during the procedure. Interventional neuro radiology nursing staff was also present. PROCEDURE: After the anesthesia team initiated general anesthesia, a time-out was performed. The patient is prepped and draped in the usual sterile fashion, room 2 in the neuro IR suite. Ultrasound survey of the right inguinal region was performed with images stored and sent to PACs. Ultrasound demonstrates patent right common femoral artery. 11 blade scalpel was used to make a small incision. Blunt dissection was performed with ultrasound guidance. A micropuncture needle was used access the right common femoral artery under ultrasound. With excellent arterial blood flow returned, an .018 micro wire was passed through the needle, observed to enter the abdominal aorta under fluoroscopy. The needle was removed, and a micropuncture sheath was placed over the wire. The inner dilator and wire were removed, and an 035 Bentson wire was advanced under fluoroscopy into the abdominal aorta. The sheath was removed and a standard 5 [place] vascular sheath was placed. The dilator was removed and the sheath was flushed. A 88F JB-1 diagnostic catheter was advanced over the wire to the proximal descending thoracic aorta. Wire was then removed. Double flush of the catheter was performed. We then attempted four-vessel cerebral angiogram. Given that the patient had a sub-optimal response to MAC anesthesia, and his heart precluded general anesthesia, we elected to abort a complete 4 vessel angiogram. Angiogram of the left cervical carotid system was performed to document status of the right ICA at the bifurcation. Images were reviewed. We then proceeded with our treatment plan. 8000 units of IV heparin was administered. The JB 1 catheter was used to select the innominate  artery, directed then into the right common carotid artery. With an adequate catheter position in the common carotid artery, roadmap angiogram was performed. The catheter was then navigated with the assistance of a Glidewire into the external carotid artery branches. Wire was removed and a exchange length wire was placed. Catheter was removed on the Rosen wire. The 5 French sheath was then removed and exchanged for 8 French 55 centimeter BrightTip sheath. Sheath was flushed and attached to pressurized and heparinized saline bag for constant forward flow. Then an 8 [place], 95 cm wall wrist balloon tip catheter was prepared on the back table with inflation of the balloon with 50/50 concentration of dilute contrast. The balloon catheter was then advanced over the wire, positioned into the distal right common carotid artery. Copious back flush was performed and the balloon catheter was attached to heparinized and pressurized saline bag for forward flow. Angiogram was then performed. After review of the images and measurements, we elected to proceed with distal embolic protection using Nav 6 device, 4 - 22m size. We selected an 9-7 x 40 mm Xact stent, with a plan for pre-dilation 5 mm x 20 mm balloon angioplasty with a Viatrac balloon. These devices were prepped on the back table,  including the retrieval device of the Nav 6. Confirmation angiogram was performed for road map guide. The bare wire on the NAV6 was advanced through the stenotic region of the ICA, into a distal position within the cervical ICA. The Nav-6 device was then advanced into a safe location above the carotid lesion with deployment of the Nav 6. Timer was started, and a gentle infusion of contrast confirmed forward flow. The deployment device was removed on the bare wire and was disposed. 5 mm balloon angioplasty was then performed. 10 atmospheres was observed. The patient remained hemodynamically stable throughout this maneuver. Balloon was removed  and disposed. The selected stent was then deployed at the lesion with the deployment device removed from the bare wire and disposed. Angiogram was performed. The retrieval device was then passed over the bare wire and the Nav 6 embolic protection device was retrieved. The entire unit was removed from the balloon guide catheter under fluoroscopic observation as this traversed the new stent. Less than 9 minutes of embolic protection open time. Angiogram was performed. We then observed 5 minutes interval and a final angiogram was performed. The 55 centimeter 8 French sheath was removed and 7 [place] Celt was deployed. Patient tolerated the procedure well and remained hemodynamically stable throughout. No complications were encountered and no significant blood loss encountered. FINDINGS: Initial Findings: Left common carotid artery: Normal course caliber and contour with independent origin from the aortic arch. Left external carotid artery: Patent with antegrade flow with moderate atherosclerosis at the origin. Left internal carotid artery: Circumferential calcifications with high-grade stenosis at the origin corresponding to the duplex exam that was recently performed of 80%-99% stenosis, on the high end. Slow flow through the distal cervical segment of the ICA. Diagnostic images of the distal cervical segment and of the intracranial portion were not capable given the patient's suboptimal response to the anesthesia and motion artifact. Right common carotid artery:  Normal course caliber and contour. Right external carotid artery: Patent with antegrade flow. Right internal carotid artery: Circumferential calcific plaque at the proximal right ICA, with irregular margins and greater than 50% residual stenosis after the previous treatment at the time of the stroke treatment. Completion: After angioplasty and stenting there is approximately 30% residual stenosis. Right MCA: M1 segment patent. Insular and opercular segments  patent. Unremarkable caliber and course of the cortical segments. Typical arterial, capillary/ parenchymal, and venous phase. Right ACA: A 1 segment patent. A 2 segment perfuses the right territory. Patent anterior communicating artery rapidly fills the left hemispheric MCA branches IMPRESSION: Status post ultrasound guided access right common femoral artery for right cervical ICA stenting with embolic protection, for treatment of high-grade symptomatic stenosis, high risk for surgery. Angiogram demonstrates a left cervical ICA critical stenosis. Signed, Dulcy Fanny. Dellia Nims, RPVI [facility] Electronically Signed   By: Corrie Mckusick D.O.   On: 11/06/2020 16:15   IR PTA Intracranial  Result Date: 11/04/2020 INDICATION: 65 year old male presents with acute right M1 occlusion, emergent large vessel occlusion, for mechanical thrombectomy EXAM: ULTRASOUND-GUIDED ACCESS RIGHT COMMON FEMORAL ARTERY ULTRASOUND-GUIDED ACCESS LEFT COMMON FEMORAL ARTERY RIGHT CERVICAL AND CEREBRAL ANGIOGRAM BALLOON ANGIOPLASTY CRITICAL STENOSIS RIGHT ICA RIGHT M1 MECHANICAL THROMBECTOMY ANGIO-SEAL FOR HEMOSTASIS. COMPARISON:  CT IMAGING SAME DAY MEDICATIONS: Preoperative T NK ANESTHESIA/SEDATION: The anesthesia team was present to provide general endotracheal tube anesthesia and for patient monitoring during the procedure. Intubation was performed in biplane/neuro interventional suite. Left radial arterial line was performed by the anesthesia team.  Interventional neuro radiology nursing staff was also present. CONTRAST:  70 cc omni 350 FLUOROSCOPY TIME:  Fluoroscopy Time: 17 minutes 48 seconds (1029 mGy). COMPLICATIONS: None TECHNIQUE: Informed written consent was obtained from the patient's family after a thorough discussion of the procedural risks, benefits and alternatives. Specific risks discussed include: Bleeding, infection, contrast reaction, kidney injury/failure, need for  further procedure/surgery, arterial injury or dissection, embolization to new territory, intracranial hemorrhage (10-15% risk), neurologic deterioration, cardiopulmonary collapse, death. All questions were addressed. Maximal Sterile Barrier Technique was utilized including during the procedure including caps, mask, sterile gowns, sterile gloves, sterile drape, hand hygiene and skin antiseptic. A timeout was performed prior to the initiation of the procedure. The anesthesia team was present to provide general endotracheal tube anesthesia and for patient monitoring during the procedure. Interventional neuro radiology nursing staff was also present. FINDINGS: Initial Findings: Right common carotid artery:  Normal course caliber and contour. Right external carotid artery: Patent with antegrade flow. Atherosclerotic plaque extends into the proximal ECA with estimated 50 percent narrowing at the origin. Right internal carotid artery: Advanced atherosclerotic changes at the right carotid bifurcation, with at least 50 percent circumferential calcified plaque in the proximal right ICA, and extension of plaque into the proximal right external carotid artery. By NASCET criteria, the initial stenosis measures at least 81 percent . After balloon angioplasty, stenosis measures less than 50 percent. Right MCA: Since the prior CT, there has been migration of the thrombus into the inferior division, with a superior division frontal branch filling. There is some contamination on the initial angiogram by the left-sided MCA branches given the significant right to left cross fill of the patent anterior communicating artery. Right ACA: A 1 segment patent. A 2 segment perfuses the right territory. Significant cross fill to the left. Completion Findings: Right MCA: TICI 3: Complete perfusion of the territory Significant cross fill persists from the right to the left. Less than 50 percent stenosis at the cervical ICA origin. PROCEDURE: The  anesthesia team was present to provide general endotracheal tube anesthesia and for patient monitoring during the procedure. Intubation was performed in negative pressure [place] in neuro IR holding. Interventional neuro radiology nursing staff was also present. Ultrasound survey of the left inguinal region was performed with images stored and sent to PACs, confirming patency of the vessel. A micropuncture needle was used access the left common femoral artery under ultrasound. With excellent arterial blood flow returned, and an .018 micro wire was passed through the needle, observed enter the abdominal aorta under fluoroscopy. The needle was removed, and a micropuncture sheath was placed over the wire. The inner dilator and wire were removed, and an 035 Bentson wire was advanced under fluoroscopy into the abdominal aorta. The sheath was removed and a standard 4 [place] vascular sheath was placed. The dilator was removed and the sheath was flushed. Ultrasound survey of the right inguinal region was performed with images stored and sent to PACs. Patency of the right common femoral artery confirmed. 11 blade scalpel was used to make a small incision. Blunt dissection was performed with US guidance. A micropuncture needle was used access the right common femoral artery under ultrasound. With excellent arterial blood flow returned, an .018 micro wire was passed through the needle, observed to enter the abdominal aorta under fluoroscopy. The needle was removed, and a micropuncture sheath was placed over the wire. The inner dilator and wire were removed, and an 035 wire was advanced under fluoroscopy into the abdominal aorta. The  sheath was removed and a 25cm 44F straight vascular sheath was placed. The dilator was removed and the sheath was flushed. Sheath was attached to pressurized and heparinized saline bag for constant forward flow. JB 1 catheter was advanced with a Glidewire to the aortic arch. The Glidewire was removed  and a double flush was performed. JB 1 was then used to select the innominate artery and the right common carotid artery origin. Catheter was advanced to the distal common carotid artery and the wire was removed. Angiogram was performed. Once we assessed the significant stenosis in the proximal right ICA, we elected to proceed 1st with balloon angioplasty. Exchange length roadrunner was advanced into the external carotid artery branches and the JB 1 was removed. A coaxial system was then advanced over the 035 wire. This included a 95cm 087 "[place]" balloon guide with coaxial 100cm JB 1 diagnostic catheter. This was advanced to the distal common carotid artery. Wire and catheter were then removed. Double flush of the balloon guide was performed. Angiogram was performed for roadmap technique. Using the roadmap technique, A synchro soft wire and rapid transit catheter were used to cross the irregular stenotic plaque of the right ICA. Catheter wire were advanced into the distal cervical ICA and the wire was removed. A floppy transcend exchange length wire was then placed through the catheter and the catheter was removed. We then proceeded with balloon angioplasty of the cervical ICA segment with a 5 mm x 30 mm Viatrac rapid exchange system. Ten atmospheric pressure was observed. As the balloon was slowly deflated, the balloon guide was advanced over the balloon and wire combination to the distal cervical segment. The balloon and wire were then removed from the system and double flush was performed at the balloon guide. Formal angiogram was performed. Road map function was used once the occluded vessel was identified. Copious back flush was performed and the balloon catheter was attached to heparinized and pressurized saline bag for forward flow. A second coaxial system was then advanced through the balloon catheter, which included the selected intermediate catheter, microcatheter, and microwire. In this scenario, the  set up included a 132cm CAT-5 intermediate catheter, a Trevo Provue18 microcatheter, and 014 synchro soft wire. This system was advanced through the balloon guide catheter under the road-map function, with adequate back-flush at the rotating hemostatic valve at that back end of the balloon guide. Microcatheter and the intermediate catheter system were advanced through the terminal ICA and MCA to the level of the occlusion. The micro wire was then carefully advanced through the occluded segment. Microcatheter was then manipulated through the occluded segment and the wire was removed with saline drip at the hub. Intermediate catheter was advanced into the proximal M1 segment. Blood was then aspirated through the hub of the microcatheter, and a gentle contrast injection was performed confirming intraluminal position. A rotating hemostatic valve was then attached to the back end of the microcatheter, and a pressurized and heparinized saline bag was attached to the catheter. 4 x 40 solitaire device was then selected. Back flush was achieved at the rotating hemostatic valve, and then the device was gently advanced through the microcatheter to the distal end. The retriever was then unsheathed by withdrawing the microcatheter under fluoroscopy. Once the retriever was completely unsheathed, the microcatheter was carefully stripped from the delivery device. Control angiogram was performed from the intermediate catheter. A 3 minute time interval was observed. The intermediate catheter was then attached to the proprietary suction engine and the  catheter was slowly advanced over the solitaire until we observed stasis of flow within the intermediate catheter. The balloon at the balloon guide catheter was then inflated under fluoroscopy for proximal flow arrest. Constant aspiration using the proprietary engine was then performed at the intermediate catheter, as the retriever was gently and slowly withdrawn with fluoroscopic  observation. Once the retriever was "corked" within the tip of the intermediate catheter, both were removed from the system. Free aspiration was confirmed at the hub of the balloon guide catheter, with free blood return confirmed. The balloon was then deflated, and a control angiogram was performed. Restoration of flow was confirmed. Balloon guide was withdrawn to the common carotid artery segment after placing the transcend wire to the distal cervical segment, to maintain wire cross the ICA origin. Angiogram of the cervical ICA was performed. Given the improved diameter on the conclusion, we elected not to deployed stent at this time, as that would require dual anti-platelet therapy. Balloon guide and wire were then removed. The skin at the puncture site was then cleaned with Chlorhexidine. The 8 French sheath was removed and an 50F angioseal was deployed. Flat panel CT was performed. Patient tolerated the procedure well and remained hemodynamically stable throughout. No complications were encountered and no significant blood loss encountered. IMPRESSION: Status post ultrasound guided access right common femoral artery for right-sided cervical and cerebral angiogram, balloon angioplasty of high-grade right ICA stenosis and mechanical thrombectomy of right M1 occlusion, achieving TICI 3 flow. Angio-Seal for hemostasis. Status post ultrasound-guided access left common femoral artery for placement of arterial monitoring line for anesthesia/critical care. Signed, Dulcy Fanny. Dellia Nims, RPVI Vascular and Interventional Radiology Specialists [facility] PLAN: The patient will remain intubated, given the critical care status ICU status Target systolic blood pressure of 120-140 Right hip straight time 6 hours Left common femoral artery monitoring line. Frequent neurovascular checks Repeat neurologic imaging with CT and/MRI at the discretion of neurology team Electronically Signed   By: Corrie Mckusick D.O.   On:  11/04/2020 08:46   IR US Guide Vasc Access Right  Result Date: 11/06/2020 INDICATION: 65 year old male with a history of symptomatic high-grade right carotid stenosis, high risk for surgery given cardiac issues EXAM: ULTRASOUND-GUIDED ACCESS RIGHT COMMON FEMORAL ARTERY RIGHT CAROTID STENT WITH EMBOLIC PROTECTION MEDICATIONS: 8000 UNITS IV HEPARIN. 300 mg p.o. Plavix ANESTHESIA/SEDATION: The anesthesia team was present to provide MAC anesthesia and for patient monitoring during the procedure. Left radial arterial line was performed by the anesthesia team. Interventional neuro radiology nursing staff was also present. FLUOROSCOPY TIME:  Fluoroscopy Time: 6 minutes 42 seconds (491 mGy). COMPLICATIONS: None TECHNIQUE: Informed written consent was obtained from the patient and the patient's family after a thorough discussion of the procedural risks, benefits and alternatives. Specific risks discussed include: Bleeding, infection, contrast reaction, kidney injury/failure, need for further procedure/surgery, arterial injury or dissection, embolization to new territory, revascularization syndrome, neurologic deterioration, cardiopulmonary collapse, death. All questions were addressed. Maximal Sterile Barrier Technique was utilized including during the procedure including caps, mask, sterile gowns, sterile gloves, sterile drape, hand hygiene and skin antiseptic. A timeout was performed prior to the initiation of the procedure. The anesthesia team was present to provide general endotracheal tube anesthesia and for patient monitoring during the procedure. Interventional neuro radiology nursing staff was also present. PROCEDURE: After the anesthesia team initiated general anesthesia, a time-out was performed. The patient is prepped and draped in the usual sterile fashion, room 2 in the neuro IR suite. Ultrasound survey  of the right inguinal region was performed with images stored and sent to PACs. Ultrasound demonstrates  patent right common femoral artery. 11 blade scalpel was used to make a small incision. Blunt dissection was performed with ultrasound guidance. A micropuncture needle was used access the right common femoral artery under ultrasound. With excellent arterial blood flow returned, an .018 micro wire was passed through the needle, observed to enter the abdominal aorta under fluoroscopy. The needle was removed, and a micropuncture sheath was placed over the wire. The inner dilator and wire were removed, and an 035 Bentson wire was advanced under fluoroscopy into the abdominal aorta. The sheath was removed and a standard 5 [place] vascular sheath was placed. The dilator was removed and the sheath was flushed. A 25F JB-1 diagnostic catheter was advanced over the wire to the proximal descending thoracic aorta. Wire was then removed. Double flush of the catheter was performed. We then attempted four-vessel cerebral angiogram. Given that the patient had a sub-optimal response to MAC anesthesia, and his heart precluded general anesthesia, we elected to abort a complete 4 vessel angiogram. Angiogram of the left cervical carotid system was performed to document status of the right ICA at the bifurcation. Images were reviewed. We then proceeded with our treatment plan. 8000 units of IV heparin was administered. The JB 1 catheter was used to select the innominate artery, directed then into the right common carotid artery. With an adequate catheter position in the common carotid artery, roadmap angiogram was performed. The catheter was then navigated with the assistance of a Glidewire into the external carotid artery branches. Wire was removed and a exchange length wire was placed. Catheter was removed on the Rosen wire. The 5 French sheath was then removed and exchanged for 8 French 55 centimeter BrightTip sheath. Sheath was flushed and attached to pressurized and heparinized saline bag for constant forward flow. Then an 8 [place],  95 cm wall wrist balloon tip catheter was prepared on the back table with inflation of the balloon with 50/50 concentration of dilute contrast. The balloon catheter was then advanced over the wire, positioned into the distal right common carotid artery. Copious back flush was performed and the balloon catheter was attached to heparinized and pressurized saline bag for forward flow. Angiogram was then performed. After review of the images and measurements, we elected to proceed with distal embolic protection using Nav 6 device, 4 - 72m size. We selected an 9-7 x 40 mm Xact stent, with a plan for pre-dilation 5 mm x 20 mm balloon angioplasty with a Viatrac balloon. These devices were prepped on the back table, including the retrieval device of the Nav 6. Confirmation angiogram was performed for road map guide. The bare wire on the NAV6 was advanced through the stenotic region of the ICA, into a distal position within the cervical ICA. The Nav-6 device was then advanced into a safe location above the carotid lesion with deployment of the Nav 6. Timer was started, and a gentle infusion of contrast confirmed forward flow. The deployment device was removed on the bare wire and was disposed. 5 mm balloon angioplasty was then performed. 10 atmospheres was observed. The patient remained hemodynamically stable throughout this maneuver. Balloon was removed and disposed. The selected stent was then deployed at the lesion with the deployment device removed from the bare wire and disposed. Angiogram was performed. The retrieval device was then passed over the bare wire and the Nav 6 embolic protection device was retrieved. The entire  unit was removed from the balloon guide catheter under fluoroscopic observation as this traversed the new stent. Less than 9 minutes of embolic protection open time. Angiogram was performed. We then observed 5 minutes interval and a final angiogram was performed. The 55 centimeter 8 French sheath was  removed and 7 [place] Celt was deployed. Patient tolerated the procedure well and remained hemodynamically stable throughout. No complications were encountered and no significant blood loss encountered. FINDINGS: Initial Findings: Left common carotid artery: Normal course caliber and contour with independent origin from the aortic arch. Left external carotid artery: Patent with antegrade flow with moderate atherosclerosis at the origin. Left internal carotid artery: Circumferential calcifications with high-grade stenosis at the origin corresponding to the duplex exam that was recently performed of 80%-99% stenosis, on the high end. Slow flow through the distal cervical segment of the ICA. Diagnostic images of the distal cervical segment and of the intracranial portion were not capable given the patient's suboptimal response to the anesthesia and motion artifact. Right common carotid artery:  Normal course caliber and contour. Right external carotid artery: Patent with antegrade flow. Right internal carotid artery: Circumferential calcific plaque at the proximal right ICA, with irregular margins and greater than 50% residual stenosis after the previous treatment at the time of the stroke treatment. Completion: After angioplasty and stenting there is approximately 30% residual stenosis. Right MCA: M1 segment patent. Insular and opercular segments patent. Unremarkable caliber and course of the cortical segments. Typical arterial, capillary/ parenchymal, and venous phase. Right ACA: A 1 segment patent. A 2 segment perfuses the right territory. Patent anterior communicating artery rapidly fills the left hemispheric MCA branches IMPRESSION: Status post ultrasound guided access right common femoral artery for right cervical ICA stenting with embolic protection, for treatment of high-grade symptomatic stenosis, high risk for surgery. Angiogram demonstrates a left cervical ICA critical stenosis. Signed, Dulcy Fanny. Dellia Nims,  RPVI Vascular and Interventional Radiology Specialists [facility] Electronically Signed   By: Corrie Mckusick D.O.   On: 11/06/2020 16:15   ECHOCARDIOGRAM COMPLETE  Result Date: 11/03/2020    ECHOCARDIOGRAM REPORT   Patient Name:   Christopher [facility] Date of Exam: 11/03/2020 Medical Rec #:  [id number]       Height:       72.0 in Accession #:    [id number]      Weight:       193.1 lb Date of Birth:  1955-03-30       BSA:          2.099 m Patient Age:    65 years        BP:           141/86 mmHg Patient Gender: M               HR:           74 bpm. Exam Location:  Inpatient Procedure: 2D Echo, Cardiac Doppler, Color Doppler and Intracardiac            Opacification Agent Indications:    Acute myocardial infarction  History:        Patient has prior history of Echocardiogram examinations, most                 recent 06/07/2019. CHF, COPD, Arrythmias:Atrial Fibrillation;                 Risk Factors:Current Smoker, Dyslipidemia, Hypertension and  Sleep Apnea.  Sonographer:    Clayton Lefort RDCS (AE) Referring Phys: [id number] [place]  1. The mid to distal septum/anterior segments are akinetic. The entire apex is akinetic. The myocardium appears thin. Suspect these findings represent a large wrap around LAD infarction (troponin >24,000). No LV thrombus on contrast imaging. There is severe LV dysfunction, EF 20-25%. Left ventricular ejection fraction, by estimation, is 20 to 25%. The left ventricle has severely decreased function. The left ventricle demonstrates regional wall motion abnormalities (see scoring diagram/findings for description). There is mild left ventricular hypertrophy. Left ventricular diastolic parameters are consistent with Grade I diastolic dysfunction (impaired relaxation).  2. Right ventricular systolic function is normal. The right ventricular size is normal. Tricuspid regurgitation signal is inadequate for assessing PA pressure.  3. The mitral valve is grossly  normal. Trivial mitral valve regurgitation. No evidence of mitral stenosis.  4. The aortic valve is tricuspid. There is moderate calcification of the aortic valve. Aortic valve regurgitation is not visualized. Mild to moderate aortic valve sclerosis/calcification is present, without any evidence of aortic stenosis.  5. The inferior vena cava is normal in size with <50% respiratory variability, suggesting right atrial pressure of 8 mmHg. Comparison(s): Changes from prior study are noted. LVEF now severely reduced 20-25%. WMA suggest LAD infarction. Conclusion(s)/Recommendation(s): Findings consistent with ischemic cardiomyopathy. FINDINGS  Left Ventricle: The mid to distal septum/anterior segments are akinetic. The entire apex is akinetic. The myocardium appears thin. Suspect these findings represent a large wrap around LAD infarction (troponin >24,000). No LV thrombus on contrast imaging. There is severe LV dysfunction, EF 20-25%. Left ventricular ejection fraction, by estimation, is 20 to 25%. The left ventricle has severely decreased function. The left ventricle demonstrates regional wall motion abnormalities. Definity contrast  agent was given IV to delineate the left ventricular endocardial borders. The left ventricular internal cavity size was normal in size. There is mild left ventricular hypertrophy. Left ventricular diastolic parameters are consistent with Grade I diastolic dysfunction (impaired relaxation).  LV Wall Scoring: The mid and distal anterior wall, mid and distal anterior septum, entire apex, and mid inferoseptal segment are akinetic. Right Ventricle: The right ventricular size is normal. No increase in right ventricular wall thickness. Right ventricular systolic function is normal. Tricuspid regurgitation signal is inadequate for assessing PA pressure. Left Atrium: Left atrial size was normal in size. Right Atrium: Right atrial size was normal in size. Pericardium: There is no evidence of  pericardial effusion. Mitral Valve: The mitral valve is grossly normal. Mild mitral annular calcification. Trivial mitral valve regurgitation. No evidence of mitral valve stenosis. MV peak gradient, 5.6 mmHg. The mean mitral valve gradient is 3.0 mmHg. Tricuspid Valve: The tricuspid valve is grossly normal. Tricuspid valve regurgitation is trivial. No evidence of tricuspid stenosis. Aortic Valve: The aortic valve is tricuspid. There is moderate calcification of the aortic valve. Aortic valve regurgitation is not visualized. Mild to moderate aortic valve sclerosis/calcification is present, without any evidence of aortic stenosis. Aortic valve mean gradient measures 4.0 mmHg. Aortic valve peak gradient measures 7.6 mmHg. Aortic valve area, by VTI measures 1.80 cm. Pulmonic Valve: The pulmonic valve was grossly normal. Pulmonic valve regurgitation is trivial. No evidence of pulmonic stenosis. Aorta: The aortic root is normal in size and structure. Venous: The inferior vena cava is normal in size with less than 50% respiratory variability, suggesting right atrial pressure of 8 mmHg. IAS/Shunts: The atrial septum is grossly normal.  LEFT VENTRICLE PLAX 2D LVIDd:  4.40 cm   Diastology LVIDs:         3.10 cm   LV e' medial:    6.53 cm/s LV PW:         1.20 cm   LV E/e' medial:  12.5 LV IVS:        1.20 cm   LV e' lateral:   6.75 cm/s LVOT diam:     2.00 cm   LV E/e' lateral: 12.1 LV SV:         54 LV SV Index:   26 LVOT Area:     3.14 cm                           3D Volume EF:                          3D EF:        42 %                          LV EDV:       154 ml                          LV ESV:       89 ml                          LV SV:        64 ml RIGHT VENTRICLE             IVC RV Basal diam:  2.90 cm     IVC diam: 1.90 cm RV S prime:     16.10 cm/s TAPSE (M-mode): 2.3 cm LEFT ATRIUM           Index        RIGHT ATRIUM           Index LA diam:      3.30 cm 1.57 cm/m   RA Area:     19.20 cm LA Vol (A4C):  45.0 ml 21.44 ml/m  RA Volume:   56.10 ml  26.73 ml/m  AORTIC VALVE AV Area (Vmax):    2.05 cm AV Area (Vmean):   1.83 cm AV Area (VTI):     1.80 cm AV Vmax:           138.00 cm/s AV Vmean:          98.600 cm/s AV VTI:            0.298 m AV Peak Grad:      7.6 mmHg AV Mean Grad:      4.0 mmHg LVOT Vmax:         90.10 cm/s LVOT Vmean:        57.500 cm/s LVOT VTI:          0.171 m LVOT/AV VTI ratio: 0.57  AORTA Ao Root diam: 3.50 cm Ao Asc diam:  2.90 cm MITRAL VALVE MV Area (PHT): 3.66 cm     SHUNTS MV Area VTI:   1.70 cm     Systemic VTI:  0.17 m MV Peak grad:  5.6 mmHg     Systemic Diam: 2.00 cm MV Mean grad:  3.0 mmHg MV Vmax:       1.18 m/s MV Vmean:      75.5 cm/s MV Decel  Time: 207 msec MV E velocity: 81.50 cm/s MV A velocity: 128.00 cm/s MV E/A ratio:  0.64 Eleonore Chiquito MD Electronically signed by Eleonore Chiquito MD Signature Date/Time: 11/03/2020/4:49:02 PM    Final    VAS US CAROTID  Result Date: 11/06/2020 Carotid Arterial Duplex Study Patient Name:  KENARD MORAWSKI  Date of Exam:   11/04/2020 Medical Rec #: [id number]        Accession #:    [id number] Date of Birth: 1955-06-21        Patient Gender: M Patient Age:   9 years Exam Location:  [facility] Procedure:      VAS US CAROTID Referring Phys: Rosalin Hawking --------------------------------------------------------------------------------  Indications:       CVA. Limitations        Today's exam was limited due to heavy calcification and the                    resulting shadowing and the patient's inability or                    unwillingness to cooperate. Comparison Study:  11-03-2020 CTA neck showed bilateral severe ICA origin                    stenosis. Performing Technologist: Darlin Coco RDMS, RVT  Examination Guidelines: A complete evaluation includes B-mode imaging, spectral Doppler, color Doppler, and power Doppler as needed of all accessible portions of each vessel. Bilateral testing is considered an integral part of a complete  examination. Limited examinations for reoccurring indications may be performed as noted.  Right Carotid Findings: +----------+--------+--------+--------+----------------------+--------+           PSV cm/sEDV cm/sStenosisPlaque Description    Comments +----------+--------+--------+--------+----------------------+--------+ CCA Prox  109     30                                             +----------+--------+--------+--------+----------------------+--------+ CCA Mid   93      27      <50%    calcific and irregular         +----------+--------+--------+--------+----------------------+--------+ CCA Distal108     32              heterogenous                   +----------+--------+--------+--------+----------------------+--------+ ICA Prox  362     148     80-99%  calcific and irregular         +----------+--------+--------+--------+----------------------+--------+ ICA Mid   348     120                                            +----------+--------+--------+--------+----------------------+--------+ ICA Distal117     38                                             +----------+--------+--------+--------+----------------------+--------+ ECA       243     25      >50%    calcific and irregular         +----------+--------+--------+--------+----------------------+--------+ +----------+--------+-------+----------------+-------------------+  PSV cm/sEDV cmsDescribe        Arm Pressure (mmHG) +----------+--------+-------+----------------+-------------------+ [id number]            Multiphasic, WNL                    +----------+--------+-------+----------------+-------------------+ +---------+--------+--+--------+--+---------+ VertebralPSV cm/s95EDV cm/s35Antegrade +---------+--------+--+--------+--+---------+  Left Carotid Findings: +----------+--------+--------+--------+--------------------+-------------------+           PSV cm/sEDV  cm/sStenosisPlaque Description  Comments            +----------+--------+--------+--------+--------------------+-------------------+ CCA Prox  83      0                                                       +----------+--------+--------+--------+--------------------+-------------------+ CCA Mid   73      0       <50%    heterogenous and                                                          smooth                                  +----------+--------+--------+--------+--------------------+-------------------+ CCA Distal31      0                                                       +----------+--------+--------+--------+--------------------+-------------------+ ICA Prox                          calcific and        Unable to visualize                                   irregular           due to                                                                    calcification/shado                                                       wing                +----------+--------+--------+--------+--------------------+-------------------+ ICA Mid   88      26                                  Velocities  may                                                            underestimate                                                             degree of stenosis                                                        due to more                                                               proximal                                                                  obstruction.        +----------+--------+--------+--------+--------------------+-------------------+ ICA Distal                                            Unable to evaluate                                                        due to patient                                                            movement/weak                                                              signal              +----------+--------+--------+--------+--------------------+-------------------+ ECA       205     21      >50%    calcific and  irregular                               +----------+--------+--------+--------+--------------------+-------------------+ +----------+--------+--------+----------------+-------------------+           PSV cm/sEDV cm/sDescribe        Arm Pressure (mmHG) +----------+--------+--------+----------------+-------------------+ [id number]             Multiphasic, WNL                    +----------+--------+--------+----------------+-------------------+ +---------+--------+--+--------+--+---------+ VertebralPSV cm/s54EDV cm/s16Antegrade +---------+--------+--+--------+--+---------+   Summary: Right Carotid: Velocities in the right ICA are consistent with a 80-99%                stenosis. The ECA appears >50% stenosed. Left Carotid: The ECA appears >50% stenosed. Unable to evaluate proximal ICA               secondary to severe calcification/acoustic shadowing. Recommend               correlate with CT angiogram. Vertebrals:  Bilateral vertebral arteries demonstrate antegrade flow. Subclavians: Normal flow hemodynamics were seen in bilateral subclavian              arteries. *See table(s) above for measurements and observations.  Electronically signed by Antony Contras MD on 11/06/2020 at 8:23:56 AM.    Final     Labs:  CBC: Recent Labs    11/04/20 0614 11/05/20 0405 11/06/20 0425 11/07/20 0302  WBC 12.3* 9.6 9.5 9.3  HGB 13.6 13.0 12.9* 12.8*  HCT 39.3 38.0* 37.4* 37.1*  PLT 145* 117* 134* 129*    COAGS: Recent Labs    11/03/20 0603  INR 1.0  APTT 39*    BMP: Recent Labs    11/04/20 0614 11/05/20 0405 11/06/20 0425 11/07/20 0302  NA 135 135 129* 135  K 3.8 3.6 2.9* 3.4*  CL 105 104 100 104  CO2 _0 GLUCOSE 121* 101*  99 90  BUN _1 CALCIUM 9.5 9.4 8.9 9.2  CREATININE 0.89 0.83 0.80 0.76  GFRNONAA >60 >60 >60 >60    LIVER FUNCTION TESTS: Recent Labs    11/03/20 0603  BILITOT 1.8*  AST 83*  ALT 30  ALKPHOS 131*  PROT 7.5  ALBUMIN 4.0    Assessment and Plan:  65 y/o M s/p successful right ICA stent placement yesterday with Dr. Earleen Newport seen today for post procedure follow up.  Patient reports doing well, no complaints besides trouble with swallowing which was present prior to procedure and being addressed by primary team (seen by SLP this morning with recommendations for regular diet/thin liquid), awaiting floor bed.   Discussed follow up with patient and his wife - plan to see Dr. Earleen Newport in [place] clinic 4-6 weeks post discharge for repeat carotid duplex and discussion of possible left ICA stenosis intervention. They are agreeable to this plan. Also strongly encouraged smoking cessation again which patient states he will try.  No further VIR needs as this time - per Dr. Earleen Newport continue single anti-platelet/plavix daily with profile of single anti-platelet + AC/DOAC per COMPASS/[facility] trials.  Plans per neurology/primary team, VIR remains available as needed please call with questions or concerns.   Electronically Signed: Joaquim Nam, PA-C 11/07/2020, 1:55 PM   I spent a total of 15 Minutes at the the patient's bedside AND on the patient's [facility] floor or unit, greater than 50% of which was counseling/coordinating  care for right ICA stent placement follow up.

## 2020-11-07 NOTE — Progress Notes (Signed)
Inpatient Rehabilitation Admissions Coordinator   I met with patient and his wife at bedside. He has progressed with therapy and no longer in need of a CIR admit. They do want HH. I will alert acute team and TOC. We will sign off at this time.  Danne Baxter, RN, MSN Rehab Admissions Coordinator [phone] 11/07/2020 5:03 PM

## 2020-11-07 NOTE — Anesthesia Postprocedure Evaluation (Signed)
Anesthesia Post Note  Patient: Christopher Lang  Procedure(s) Performed: STENTING     Patient location during evaluation: PACU Anesthesia Type: MAC Level of consciousness: awake and alert Pain management: pain level controlled Vital Signs Assessment: post-procedure vital signs reviewed and stable Respiratory status: spontaneous breathing, nonlabored ventilation and respiratory function stable Cardiovascular status: blood pressure returned to baseline and stable Postop Assessment: no apparent nausea or vomiting Anesthetic complications: no   No notable events documented.  Last Vitals:  Vitals:   11/07/20 0500 11/07/20 0600  BP: (!) 130/56 112/61  Pulse: 68 (!) 55  Resp: 17 18  Temp:    SpO2: 92% 93%    Last Pain:  Vitals:   11/07/20 0400  TempSrc: Oral  PainSc: Asleep                 Lynda Rainwater

## 2020-11-07 NOTE — Progress Notes (Signed)
Pt. Arrived to the room from 4NICU. Alert and oriented  x4. Oriented to the room. Bed locked and bed alarm on. Wife at bedside and supportive. Pt in good spirits and ready to move forward. VSS. Call bell in reach.

## 2020-11-07 NOTE — Progress Notes (Signed)
Physical Therapy Treatment Patient Details Name: Christopher Lang MRN: [id number] DOB: Nov 16, 1955 Today's Date: 11/07/2020   History of Present Illness 65 yo male admitted with CP and noted to have L side weakness. CT dense R M1 segment remote cortical infarcts on L. Angiogram R ICA bifurcation mechanical thrombectomy of R M1 performed, s/p US guided R CFA access, R cervical/cerebral angio and stent placement for recurrent R ICA high grade stenosis on 10/19.  10/16 extubated, EKG showed anteriorlateral STEMI.  PMH COPD HTn Afib HLD Sleep apnea    PT Comments    Pt demonstrates improved activity tolerance, balance, and gait today, ambulating >400 ft in hallway half of which without any UE support. Pt still has some difficulty scanning L when in hallway, cues for turning head L to check for obstacles which pt states he understands "because something over there could hit me". Pt does demonstrate higher level balance impairment, with DGI score of 17/24 making pt at risk of falls. Overall, PT feels pt is able to go home with support of wife and other family members, PT emphasizing supervision for all mobility to prevent falls. Pt and wife express understanding, pt agreeable to home with HHPT. CIR notified.      Recommendations for follow up therapy are one component of a multi-disciplinary discharge planning process, led by the attending physician.  Recommendations may be updated based on patient status, additional functional criteria and insurance authorization.  Follow Up Recommendations  Home health PT;Supervision for mobility/OOB     Equipment Recommendations  None recommended by PT    Recommendations for Other Services       Precautions / Restrictions Precautions Precautions: Fall Restrictions Weight Bearing Restrictions: No     Mobility  Bed Mobility Overal bed mobility: Needs Assistance Bed Mobility: Supine to Sit;Sit to Supine     Supine to sit: Supervision Sit to supine:  Supervision   General bed mobility comments: for safety    Transfers Overall transfer level: Needs assistance Equipment used: None Transfers: Sit to/from Stand Sit to Stand: Supervision         General transfer comment: for safety only, repeated STS x5 without UE support performed well.  Ambulation/Gait Ambulation/Gait assistance: Min guard Gait Distance (Feet): 450 Feet Assistive device: IV Pole;None Gait Pattern/deviations: Step-through pattern;Decreased stride length;Narrow base of support Gait velocity: decr   General Gait Details: Initially pt with shuffling gait, improved step length with PT cuing. Additional cuing for attending to L, being in center of hallway vs preference towards L.   Stairs Stairs: Yes Stairs assistance: Min guard Stair Management: Two rails;Alternating pattern;Forwards Number of Stairs: 3     Wheelchair Mobility    Modified Rankin (Stroke Patients Only) Modified Rankin (Stroke Patients Only) Pre-Morbid Rankin Score: No significant disability Modified Rankin: Moderate disability     Balance Overall balance assessment: Needs assistance Sitting-balance support: Feet supported;No upper extremity supported Sitting balance-Leahy Scale: Good     Standing balance support: No upper extremity supported Standing balance-Leahy Scale: Fair                   Standardized Balance Assessment Standardized Balance Assessment : Dynamic Gait Index   Dynamic Gait Index Level Surface: Normal Change in Gait Speed: Normal Gait with Horizontal Head Turns: Mild Impairment Gait with Vertical Head Turns: Mild Impairment Gait and Pivot Turn: Mild Impairment Step Over Obstacle: Moderate Impairment Step Around Obstacles: Mild Impairment Steps: Mild Impairment Total Score: 17      Cognition Arousal/Alertness:  Awake/alert Behavior During Therapy: WFL for tasks assessed/performed Overall Cognitive Status: Impaired/Different from baseline Area  of Impairment: Safety/judgement;Awareness                         Safety/Judgement: Decreased awareness of deficits;Decreased awareness of safety Awareness: Emergent   General Comments: L inattention present, cues for scanning L and R during hallway navigation      Exercises Other Exercises Other Exercises: step ups x10, bilat    General Comments General comments (skin integrity, edema, etc.): vss      Pertinent Vitals/Pain Pain Assessment: No/denies pain    Home Living                      Prior Function            PT Goals (current goals can now be found in the care plan section) Acute Rehab PT Goals Patient Stated Goal: get better, better balance PT Goal Formulation: With patient/family Time For Goal Achievement: 11/19/20 Potential to Achieve Goals: Good Progress towards PT goals: Progressing toward goals    Frequency    Min 4X/week      PT Plan Discharge plan needs to be updated    Co-evaluation              AM-PAC PT "6 Clicks" Mobility   Outcome Measure  Help needed turning from your back to your side while in a flat bed without using bedrails?: None Help needed moving from lying on your back to sitting on the side of a flat bed without using bedrails?: None Help needed moving to and from a bed to a chair (including a wheelchair)?: A Little Help needed standing up from a chair using your arms (e.g., wheelchair or bedside chair)?: A Little Help needed to walk in hospital room?: A Little Help needed climbing 3-5 steps with a railing? : A Little 6 Click Score: 20    End of Session Equipment Utilized During Treatment: Gait belt Activity Tolerance: Patient tolerated treatment well Patient left: with call bell/phone within reach;with family/visitor present;in bed;with bed alarm set Nurse Communication: Mobility status;Other (comment) PT Visit Diagnosis: Unsteadiness on feet (R26.81);Difficulty in walking, not elsewhere classified  (R26.2)     Time: 4235-3614 PT Time Calculation (min) (ACUTE ONLY): 27 min  Charges:  $Gait Training: 8-22 mins $Neuromuscular Re-education: 8-22 mins                     Stacie Glaze, PT DPT Acute Rehabilitation Services Pager [phone]  Office [phone]    [place] 11/07/2020, 4:19 PM

## 2020-11-07 NOTE — Progress Notes (Signed)
ANTICOAGULATION CONSULT NOTE  Pharmacy Consult for heparin Indication: chest pain/ACS  No Known Allergies  Patient Measurements: Height: 6' (182.9 cm) Weight: 93 kg (205 lb) IBW/kg (Calculated) : 77.6 Heparin Dosing Weight: 87.6  Vital Signs: Temp: 98.8 F (37.1 C) (10/20 0000) Temp Source: Oral (10/20 0000) BP: 124/78 (10/20 0100) Pulse Rate: 59 (10/20 0100)  Labs: Recent Labs    11/05/20 0405 11/06/20 0425 11/06/20 0716 11/07/20 0302  HGB 13.0 12.9*  --  12.8*  HCT 38.0* 37.4*  --  37.1*  PLT 117* 134*  --  129*  HEPARINUNFRC 0.35 <0.10* <0.10* 0.11*  CREATININE 0.83 0.80  --  0.76     Estimated Creatinine Clearance: 98.3 mL/min (by C-G formula based on SCr of 0.76 mg/dL).   Assessment: 53 YOM who presented with a R MCA stroke who was found to have a STEMI. Pharmacy consulted to start IV heparin for ACS/STEMI.   Currently on IV heparin at 1150 units/hr. A repeat HL this AM remains undetectable. Per RN, no issues with the IV site and no s/s of overt bleeding anywhere. Pt is scheduled for IR this AM with plans to stop IV heparin at 10 AM.   10/20 AM update: Heparin level low after re-start s/p carotid stenting   Goal of Therapy:  Heparin level 0.3-0.5 units/ml Monitor platelets by anticoagulation protocol: Yes   Plan:  Inc heparin to 1450 units/hr 1200 heparin level  Narda Bonds, PharmD, BCPS Clinical Pharmacist Phone: [phone]

## 2020-11-07 NOTE — Progress Notes (Signed)
Pt transported to 3W via Wheelchair, accompanied by the Wife, alert and oriented, all questions answered, report given to 3W RN.

## 2020-11-07 NOTE — Progress Notes (Addendum)
ANTICOAGULATION CONSULT NOTE  Pharmacy Consult for heparin Indication: chest pain/ACS; h/o Afib   No Known Allergies  Patient Measurements: Height: 6' (182.9 cm) Weight: 93 kg (205 lb) IBW/kg (Calculated) : 77.6 Heparin Dosing Weight: 87.6  Vital Signs: Temp: 97.5 F (36.4 C) (10/20 1100) Temp Source: Oral (10/20 0400) BP: 140/73 (10/20 1221) Pulse Rate: 80 (10/20 1000)  Labs: Recent Labs    11/05/20 0405 11/06/20 0425 11/06/20 0716 11/07/20 0302 11/07/20 1140  HGB 13.0 12.9*  --  12.8*  --   HCT 38.0* 37.4*  --  37.1*  --   PLT 117* 134*  --  129*  --   HEPARINUNFRC 0.35 <0.10* <0.10* 0.11* 0.12*  CREATININE 0.83 0.80  --  0.76  --      Estimated Creatinine Clearance: 98.3 mL/min (by C-G formula based on SCr of 0.76 mg/dL).   Assessment: 62 YOM who presented with a R MCA stroke who was found to have a STEMI. Pharmacy consulted to start IV heparin for ACS/STEMI.   Currently on IV heparin at 1450 units/hr. A repeat HL this afternoon remains subtherapeutic. H/H and Plt low stable. SCr wnl. RN reports no issues with infusion and no s/s of overt bleeding.    Goal of Therapy:  Heparin level 0.3-0.5 units/ml Monitor platelets by anticoagulation protocol: Yes   Plan:  Increase heparin to 1600 units / hr 8 hour heparin level and monitor for s/s of bleeding   Albertina Parr, PharmD., BCPS, BCCCP Clinical Pharmacist Please refer to [facility] for unit-specific pharmacist    Addendum: Now transitioning to Eliquis per neurology with instructions to give first dose tonight. Will start apixaban 5 mg twice daily at 10 PM tonight. Will stop IV heparin with first dose of Eliquis.   Albertina Parr, PharmD., BCPS, BCCCP Clinical Pharmacist Please refer to [facility] for unit-specific pharmacist

## 2020-11-07 NOTE — Progress Notes (Signed)
Progress Note  Patient Name: Christopher Lang Date of Encounter: 11/07/2020  [facility] Cardiologist: Rozann Lesches, MD   Subjective   Feels well.  Wants to go home.   Inpatient Medications    Scheduled Meds:  atorvastatin  40 mg Oral q1800   Chlorhexidine Gluconate Cloth  6 each Topical Daily   cholecalciferol  1,000 Units Oral Daily   clopidogrel  75 mg Oral Daily   labetalol  20 mg Intravenous Once   metoprolol tartrate  12.5 mg Oral BID   nicotine  14 mg Transdermal Daily   sacubitril-valsartan  1 tablet Oral BID   sodium chloride flush  3 mL Intravenous Once   spironolactone  12.5 mg Oral Daily   varenicline  0.5 mg Oral Daily   Followed by   Derrill Memo ON 11/08/2020] varenicline  0.5 mg Oral BID   Continuous Infusions:  heparin 1,450 Units/hr (11/07/20 1016)   PRN Meds: acetaminophen **OR** acetaminophen (TYLENOL) oral liquid 160 mg/5 mL **OR** acetaminophen, hydrALAZINE, senna-docusate   Vital Signs    Vitals:   11/07/20 0700 11/07/20 0800 11/07/20 0900 11/07/20 1000  BP: (!) 111/50 130/69    Pulse: (!) 57 (!) 57 82 80  Resp: 19 (!) _0 Temp: 98.3 F (36.8 C)     TempSrc:      SpO2: 94% 98% 96% 96%  Weight:      Height:        Intake/Output Summary (Last 24 hours) at 11/07/2020 1040 Last data filed at 11/07/2020 0921 Gross per 24 hour  Intake 972.87 ml  Output 50 ml  Net 922.87 ml   Last 3 Weights 11/06/2020 11/03/2020 12/12/2019  Weight (lbs) 205 lb 193 lb 2 oz 207 lb  Weight (kg) 92.987 kg 87.6 kg 93.895 kg      Telemetry    NSR - Personally Reviewed  ECG      Physical Exam   GEN: No acute distress.   Neck: No JVD Cardiac: RRR, no murmurs, rubs, or gallops.  Respiratory: Clear to auscultation bilaterally. GI: Soft, nontender, non-distended  MS: No edema; No deformity. Neuro:  Nonfocal; strength is intact in his left arm- big improvement since Sunday Psych: Normal affect   Labs    High Sensitivity Troponin:   Recent  Labs  Lab 11/03/20 1423 11/03/20 2227  TROPONINIHS >24,000* >24,000*     Chemistry Recent Labs  Lab 11/03/20 0603 11/03/20 0622 11/05/20 0405 11/06/20 0425 11/07/20 0302  NA 133*   < > 135 129* 135  K 3.8   < > 3.6 2.9* 3.4*  CL 98   < > 104 100 104  CO2 25   < > _1 GLUCOSE 194*   < > 101* 99 90  BUN 11   < > _2 CREATININE 0.93   < > 0.83 0.80 0.76  CALCIUM 10.2   < > 9.4 8.9 9.2  PROT 7.5  --   --   --   --   ALBUMIN 4.0  --   --   --   --   AST 83*  --   --   --   --   ALT 30  --   --   --   --   ALKPHOS 131*  --   --   --   --   BILITOT 1.8*  --   --   --   --   GFRNONAA >60   < > >  60 >60 >60  ANIONGAP 10   < > _0 < > = values in this interval not displayed.    Lipids  Recent Labs  Lab 11/04/20 0614  CHOL 159  TRIG 187*  HDL 31*  LDLCALC 91  CHOLHDL 5.1    Hematology Recent Labs  Lab 11/05/20 0405 11/06/20 0425 11/07/20 0302  WBC 9.6 9.5 9.3  RBC 3.86* 3.87* 3.79*  HGB 13.0 12.9* 12.8*  HCT 38.0* 37.4* 37.1*  MCV 98.4 96.6 97.9  MCH 33.7 33.3 33.8  MCHC 34.2 34.5 34.5  RDW 12.4 12.2 12.1  PLT 117* 134* 129*   Thyroid No results for input(s): TSH, FREET4 in the last 168 hours.  BNPNo results for input(s): BNP, PROBNP in the last 168 hours.  DDimer No results for input(s): DDIMER in the last 168 hours.   Radiology    IR INTRAVSC STENT CERV CAROTID W/EMB-PROT MOD SED  Result Date: 11/06/2020 INDICATION: 65 year old male with a history of symptomatic high-grade right carotid stenosis, high risk for surgery given cardiac issues EXAM: ULTRASOUND-GUIDED ACCESS RIGHT COMMON FEMORAL ARTERY RIGHT CAROTID STENT WITH EMBOLIC PROTECTION MEDICATIONS: 8000 UNITS IV HEPARIN. 300 mg p.o. Plavix ANESTHESIA/SEDATION: The anesthesia team was present to provide MAC anesthesia and for patient monitoring during the procedure. Left radial arterial line was performed by the anesthesia team. Interventional neuro radiology nursing staff was also present.  FLUOROSCOPY TIME:  Fluoroscopy Time: 6 minutes 42 seconds (491 mGy). COMPLICATIONS: None TECHNIQUE: Informed written consent was obtained from the patient and the patient's family after a thorough discussion of the procedural risks, benefits and alternatives. Specific risks discussed include: Bleeding, infection, contrast reaction, kidney injury/failure, need for further procedure/surgery, arterial injury or dissection, embolization to new territory, revascularization syndrome, neurologic deterioration, cardiopulmonary collapse, death. All questions were addressed. Maximal Sterile Barrier Technique was utilized including during the procedure including caps, mask, sterile gowns, sterile gloves, sterile drape, hand hygiene and skin antiseptic. A timeout was performed prior to the initiation of the procedure. The anesthesia team was present to provide general endotracheal tube anesthesia and for patient monitoring during the procedure. Interventional neuro radiology nursing staff was also present. PROCEDURE: After the anesthesia team initiated general anesthesia, a time-out was performed. The patient is prepped and draped in the usual sterile fashion, room 2 in the neuro IR suite. Ultrasound survey of the right inguinal region was performed with images stored and sent to PACs. Ultrasound demonstrates patent right common femoral artery. 11 blade scalpel was used to make a small incision. Blunt dissection was performed with ultrasound guidance. A micropuncture needle was used access the right common femoral artery under ultrasound. With excellent arterial blood flow returned, an .018 micro wire was passed through the needle, observed to enter the abdominal aorta under fluoroscopy. The needle was removed, and a micropuncture sheath was placed over the wire. The inner dilator and wire were removed, and an 035 Bentson wire was advanced under fluoroscopy into the abdominal aorta. The sheath was removed and a standard 5  [place] vascular sheath was placed. The dilator was removed and the sheath was flushed. A 44F JB-1 diagnostic catheter was advanced over the wire to the proximal descending thoracic aorta. Wire was then removed. Double flush of the catheter was performed. We then attempted four-vessel cerebral angiogram. Given that the patient had a sub-optimal response to MAC anesthesia, and his heart precluded general anesthesia, we elected to abort a complete 4 vessel angiogram. Angiogram of the left cervical carotid  system was performed to document status of the right ICA at the bifurcation. Images were reviewed. We then proceeded with our treatment plan. 8000 units of IV heparin was administered. The JB 1 catheter was used to select the innominate artery, directed then into the right common carotid artery. With an adequate catheter position in the common carotid artery, roadmap angiogram was performed. The catheter was then navigated with the assistance of a Glidewire into the external carotid artery branches. Wire was removed and a exchange length wire was placed. Catheter was removed on the Rosen wire. The 5 French sheath was then removed and exchanged for 8 French 55 centimeter BrightTip sheath. Sheath was flushed and attached to pressurized and heparinized saline bag for constant forward flow. Then an 8 [place], 95 cm wall wrist balloon tip catheter was prepared on the back table with inflation of the balloon with 50/50 concentration of dilute contrast. The balloon catheter was then advanced over the wire, positioned into the distal right common carotid artery. Copious back flush was performed and the balloon catheter was attached to heparinized and pressurized saline bag for forward flow. Angiogram was then performed. After review of the images and measurements, we elected to proceed with distal embolic protection using Nav 6 device, 4 - 20m size. We selected an 9-7 x 40 mm Xact stent, with a plan for pre-dilation 5 mm x 20  mm balloon angioplasty with a Viatrac balloon. These devices were prepped on the back table, including the retrieval device of the Nav 6. Confirmation angiogram was performed for road map guide. The bare wire on the NAV6 was advanced through the stenotic region of the ICA, into a distal position within the cervical ICA. The Nav-6 device was then advanced into a safe location above the carotid lesion with deployment of the Nav 6. Timer was started, and a gentle infusion of contrast confirmed forward flow. The deployment device was removed on the bare wire and was disposed. 5 mm balloon angioplasty was then performed. 10 atmospheres was observed. The patient remained hemodynamically stable throughout this maneuver. Balloon was removed and disposed. The selected stent was then deployed at the lesion with the deployment device removed from the bare wire and disposed. Angiogram was performed. The retrieval device was then passed over the bare wire and the Nav 6 embolic protection device was retrieved. The entire unit was removed from the balloon guide catheter under fluoroscopic observation as this traversed the new stent. Less than 9 minutes of embolic protection open time. Angiogram was performed. We then observed 5 minutes interval and a final angiogram was performed. The 55 centimeter 8 French sheath was removed and 7 [place] was deployed. Patient tolerated the procedure well and remained hemodynamically stable throughout. No complications were encountered and no significant blood loss encountered. FINDINGS: Initial Findings: Left common carotid artery: Normal course caliber and contour with independent origin from the aortic arch. Left external carotid artery: Patent with antegrade flow with moderate atherosclerosis at the origin. Left internal carotid artery: Circumferential calcifications with high-grade stenosis at the origin corresponding to the duplex exam that was recently performed of 80%-99% stenosis, on  the high end. Slow flow through the distal cervical segment of the ICA. Diagnostic images of the distal cervical segment and of the intracranial portion were not capable given the patient's suboptimal response to the anesthesia and motion artifact. Right common carotid artery:  Normal course caliber and contour. Right external carotid artery: Patent with antegrade flow. Right internal carotid artery:  Circumferential calcific plaque at the proximal right ICA, with irregular margins and greater than 50% residual stenosis after the previous treatment at the time of the stroke treatment. Completion: After angioplasty and stenting there is approximately 30% residual stenosis. Right MCA: M1 segment patent. Insular and opercular segments patent. Unremarkable caliber and course of the cortical segments. Typical arterial, capillary/ parenchymal, and venous phase. Right ACA: A 1 segment patent. A 2 segment perfuses the right territory. Patent anterior communicating artery rapidly fills the left hemispheric MCA branches IMPRESSION: Status post ultrasound guided access right common femoral artery for right cervical ICA stenting with embolic protection, for treatment of high-grade symptomatic stenosis, high risk for surgery. Angiogram demonstrates a left cervical ICA critical stenosis. Signed, Dulcy Fanny. Dellia Nims, RPVI [facility] Electronically Signed   By: Corrie Mckusick D.O.   On: 11/06/2020 16:15   IR US Guide Vasc Access Right  Result Date: 11/06/2020 INDICATION: 65 year old male with a history of symptomatic high-grade right carotid stenosis, high risk for surgery given cardiac issues EXAM: ULTRASOUND-GUIDED ACCESS RIGHT COMMON FEMORAL ARTERY RIGHT CAROTID STENT WITH EMBOLIC PROTECTION MEDICATIONS: 8000 UNITS IV HEPARIN. 300 mg p.o. Plavix ANESTHESIA/SEDATION: The anesthesia team was present to provide MAC anesthesia and for patient monitoring during the  procedure. Left radial arterial line was performed by the anesthesia team. Interventional neuro radiology nursing staff was also present. FLUOROSCOPY TIME:  Fluoroscopy Time: 6 minutes 42 seconds (491 mGy). COMPLICATIONS: None TECHNIQUE: Informed written consent was obtained from the patient and the patient's family after a thorough discussion of the procedural risks, benefits and alternatives. Specific risks discussed include: Bleeding, infection, contrast reaction, kidney injury/failure, need for further procedure/surgery, arterial injury or dissection, embolization to new territory, revascularization syndrome, neurologic deterioration, cardiopulmonary collapse, death. All questions were addressed. Maximal Sterile Barrier Technique was utilized including during the procedure including caps, mask, sterile gowns, sterile gloves, sterile drape, hand hygiene and skin antiseptic. A timeout was performed prior to the initiation of the procedure. The anesthesia team was present to provide general endotracheal tube anesthesia and for patient monitoring during the procedure. Interventional neuro radiology nursing staff was also present. PROCEDURE: After the anesthesia team initiated general anesthesia, a time-out was performed. The patient is prepped and draped in the usual sterile fashion, room 2 in the neuro IR suite. Ultrasound survey of the right inguinal region was performed with images stored and sent to PACs. Ultrasound demonstrates patent right common femoral artery. 11 blade scalpel was used to make a small incision. Blunt dissection was performed with ultrasound guidance. A micropuncture needle was used access the right common femoral artery under ultrasound. With excellent arterial blood flow returned, an .018 micro wire was passed through the needle, observed to enter the abdominal aorta under fluoroscopy. The needle was removed, and a micropuncture sheath was placed over the wire. The inner dilator and wire  were removed, and an 035 Bentson wire was advanced under fluoroscopy into the abdominal aorta. The sheath was removed and a standard 5 [place] vascular sheath was placed. The dilator was removed and the sheath was flushed. A 67F JB-1 diagnostic catheter was advanced over the wire to the proximal descending thoracic aorta. Wire was then removed. Double flush of the catheter was performed. We then attempted four-vessel cerebral angiogram. Given that the patient had a sub-optimal response to MAC anesthesia, and his heart precluded general anesthesia, we elected to abort a complete 4 vessel angiogram. Angiogram of the left cervical carotid system was  performed to document status of the right ICA at the bifurcation. Images were reviewed. We then proceeded with our treatment plan. 8000 units of IV heparin was administered. The JB 1 catheter was used to select the innominate artery, directed then into the right common carotid artery. With an adequate catheter position in the common carotid artery, roadmap angiogram was performed. The catheter was then navigated with the assistance of a Glidewire into the external carotid artery branches. Wire was removed and a exchange length wire was placed. Catheter was removed on the Rosen wire. The 5 French sheath was then removed and exchanged for 8 French 55 centimeter BrightTip sheath. Sheath was flushed and attached to pressurized and heparinized saline bag for constant forward flow. Then an 8 [place], 95 cm wall wrist balloon tip catheter was prepared on the back table with inflation of the balloon with 50/50 concentration of dilute contrast. The balloon catheter was then advanced over the wire, positioned into the distal right common carotid artery. Copious back flush was performed and the balloon catheter was attached to heparinized and pressurized saline bag for forward flow. Angiogram was then performed. After review of the images and measurements, we elected to proceed with  distal embolic protection using Nav 6 device, 4 - 52m size. We selected an 9-7 x 40 mm Xact stent, with a plan for pre-dilation 5 mm x 20 mm balloon angioplasty with a Viatrac balloon. These devices were prepped on the back table, including the retrieval device of the Nav 6. Confirmation angiogram was performed for road map guide. The bare wire on the NAV6 was advanced through the stenotic region of the ICA, into a distal position within the cervical ICA. The Nav-6 device was then advanced into a safe location above the carotid lesion with deployment of the Nav 6. Timer was started, and a gentle infusion of contrast confirmed forward flow. The deployment device was removed on the bare wire and was disposed. 5 mm balloon angioplasty was then performed. 10 atmospheres was observed. The patient remained hemodynamically stable throughout this maneuver. Balloon was removed and disposed. The selected stent was then deployed at the lesion with the deployment device removed from the bare wire and disposed. Angiogram was performed. The retrieval device was then passed over the bare wire and the Nav 6 embolic protection device was retrieved. The entire unit was removed from the balloon guide catheter under fluoroscopic observation as this traversed the new stent. Less than 9 minutes of embolic protection open time. Angiogram was performed. We then observed 5 minutes interval and a final angiogram was performed. The 55 centimeter 8 French sheath was removed and 7 [place] was deployed. Patient tolerated the procedure well and remained hemodynamically stable throughout. No complications were encountered and no significant blood loss encountered. FINDINGS: Initial Findings: Left common carotid artery: Normal course caliber and contour with independent origin from the aortic arch. Left external carotid artery: Patent with antegrade flow with moderate atherosclerosis at the origin. Left internal carotid artery: Circumferential  calcifications with high-grade stenosis at the origin corresponding to the duplex exam that was recently performed of 80%-99% stenosis, on the high end. Slow flow through the distal cervical segment of the ICA. Diagnostic images of the distal cervical segment and of the intracranial portion were not capable given the patient's suboptimal response to the anesthesia and motion artifact. Right common carotid artery:  Normal course caliber and contour. Right external carotid artery: Patent with antegrade flow. Right internal carotid artery: Circumferential calcific  plaque at the proximal right ICA, with irregular margins and greater than 50% residual stenosis after the previous treatment at the time of the stroke treatment. Completion: After angioplasty and stenting there is approximately 30% residual stenosis. Right MCA: M1 segment patent. Insular and opercular segments patent. Unremarkable caliber and course of the cortical segments. Typical arterial, capillary/ parenchymal, and venous phase. Right ACA: A 1 segment patent. A 2 segment perfuses the right territory. Patent anterior communicating artery rapidly fills the left hemispheric MCA branches IMPRESSION: Status post ultrasound guided access right common femoral artery for right cervical ICA stenting with embolic protection, for treatment of high-grade symptomatic stenosis, high risk for surgery. Angiogram demonstrates a left cervical ICA critical stenosis. Signed, Dulcy Fanny. Dellia Nims, RPVI [facility] Radiology Electronically Signed   By: Corrie Mckusick D.O.   On: 11/06/2020 16:15    Cardiac Studies   Low EF  Patient Profile     65 y.o. male CVA and anterior MI  Assessment & Plan    Acute systolic heart failure: Occurred in the setting of anterior MI.  No revascularization.  He is on Entresto, metoprolol and Aldactone.  Blood pressure stable.  I am hesitant to titrate the medications to avoid any  hypotension in the setting of recent stroke.  High risk of apical thrombus formation.  Given history of paroxysmal A. fib and stroke, would like to discharge him on Eliquis if safe from a CVA standpoint.  If not, then we will just continue with antiplatelet therapy and can reecho in a few months.     For questions or updates, please contact [place] Please consult [email or web address] for contact info under        Signed, Larae Grooms, MD  11/07/2020, 10:40 AM

## 2020-11-07 NOTE — Evaluation (Signed)
Clinical/Bedside Swallow Evaluation Patient Details  Name: Christopher Lang MRN: [id number] Date of Birth: 08/04/1955  Today's Date: 11/07/2020 Time: SLP Start Time (ACUTE ONLY): 8676 SLP Stop Time (ACUTE ONLY): 7209 SLP Time Calculation (min) (ACUTE ONLY): 20 min  Past Medical History:  Past Medical History:  Diagnosis Date   Acute CVA (cerebrovascular accident) ([place]) 11/03/2020   Acute left-sided weakness 11/03/2020   Acute ST elevation myocardial infarction (STEMI) ([place]) 11/03/2020   Cataract    Removed Dec 2017/Jan 2018   COPD (chronic obstructive pulmonary disease) ([place])    Essential hypertension    H. pylori infection 11/2016   With documented eradication via biopsy Feb 2019   History of atrial fibrillation    November 2017   Hyperlipidemia    Sleep apnea    Did not tolerate CPAP   Tubulovillous adenoma of rectum 04/2010   Past Surgical History:  Past Surgical History:  Procedure Laterality Date   CATARACT EXTRACTION W/PHACO Right 01/16/2016   Procedure: CATARACT EXTRACTION PHACO AND INTRAOCULAR LENS PLACEMENT RIGHT EYE CDE= 6.97;  Surgeon: Tonny Branch, MD;  Location: [facility];  Service: Ophthalmology;  Laterality: Right;  right   CATARACT EXTRACTION W/PHACO Left 01/30/2016   Procedure: CATARACT EXTRACTION PHACO AND INTRAOCULAR LENS PLACEMENT LEFT EYE CDE 3.13;  Surgeon: Tonny Branch, MD;  Location: [facility];  Service: Ophthalmology;  Laterality: Left;  left   COLONOSCOPY  2012   COLONOSCOPY N/A 12/04/2016   three 4-6 mm tubular adenomas, moderate size external and internal hemorrhoids, redundant left colon, 3 year surveillance   ESOPHAGOGASTRODUODENOSCOPY N/A 12/04/2016   Dr. Oneida Alar: normal esophagus, small hiatal hernia, chronic gastritis with H.pylori, peptic duodenitis, atypical lymphoid proliferation. Surveillance EGD Feb 2019 without abnormal cells, negative H.pylori   ESOPHAGOGASTRODUODENOSCOPY N/A 03/15/2017   mild gastritis, no atypical cells, negative H.pylori    EXCISION, SKI  08/2016   R THIGH   IR CT HEAD LTD  11/03/2020   IR INTRAVSC STENT CERV CAROTID W/EMB-PROT MOD SED INCL ANGIO  11/06/2020   IR PERCUTANEOUS ART THROMBECTOMY/INFUSION INTRACRANIAL INC DIAG ANGIO  11/03/2020   IR PTA INTRACRANIAL  11/04/2020   IR US GUIDE VASC ACCESS LEFT  11/03/2020   IR US GUIDE VASC ACCESS RIGHT  11/03/2020   IR US GUIDE VASC ACCESS RIGHT  11/06/2020   RADIOLOGY WITH ANESTHESIA N/A 11/03/2020   Procedure: RADIOLOGY WITH ANESTHESIA;  Surgeon: Radiologist, Medication, MD;  Location: [place];  Service: Radiology;  Laterality: N/A;   RADIOLOGY WITH ANESTHESIA N/A 11/06/2020   Procedure: STENTING;  Surgeon: Corrie Mckusick, DO;  Location: [place];  Service: Anesthesiology;  Laterality: N/A;   HPI:  65 year old man brought in by EMS complaining of chest pain and noted to have left-sided weakness.   Head CT showed dense right M1 segment, remote cortical infarcts on left .  CT angiogram confirmed large vessel occlusion of right M1 segment with severe bilateral ICA origin stenosis. Angiogram showed high-grade stenosis of right ICA bifurcation, treated by PTA, mechanical thrombectomy of right M1 performed. Intubated for procedure.    Assessment / Plan / Recommendation  Clinical Impression  Pt was seen for a repeated bedside swallow evaluation after onset of new s/s of possible penetration/aspiration per RN. Pt reports coughing with thin liquid which is mitigated by self-discovered strategies of taking small sips and sipping from cup vs. straw. Pt also reports tucking chin is beneficial in mitigating instances of suspected aspiration. SLP trialed thin liquid and regular solids, noting pocketing in the left lateral sulci  with regular solids. Pt demonstrated awareness of pocketing and SLP educated pt on tips for clearing residue (lingual sweep, finger/swab sweep). Pt reported difficulty taking pills with water; SLP suggesting taking pills whole with puree. Recommend continue with  regular/thin liquid diet implementing the following strategies: small sips/bites, chin tuck, lingual/finger sweep of oral cavity with solids. SLP will f/u tomorrow and if overt s/s of probable aspiration persist, will complete instrumental study to objectively view swallow function.  SLP Visit Diagnosis: Dysphagia, unspecified (R13.10)    Aspiration Risk  Mild aspiration risk    Diet Recommendation Regular;Thin liquid   Liquid Administration via: Cup Medication Administration: Whole meds with puree Supervision: Patient able to self feed Compensations: Slow rate;Lingual sweep for clearance of pocketing;Small sips/bites;Chin tuck;Other (Comment) (no straw) Postural Changes: Seated upright at 90 degrees;Remain upright for at least 30 minutes after po intake    Other  Recommendations Oral Care Recommendations: Oral care BID    Recommendations for follow up therapy are one component of a multi-disciplinary discharge planning process, led by the attending physician.  Recommendations may be updated based on patient status, additional functional criteria and insurance authorization.  Follow up Recommendations Inpatient Rehab      Frequency and Duration min 2x/week  2 weeks       Prognosis Prognosis for Safe Diet Advancement: Good      Swallow Study   General Date of Onset: 11/03/20 HPI: 65 year old man brought in by EMS complaining of chest pain and noted to have left-sided weakness.   Head CT showed dense right M1 segment, remote cortical infarcts on left .  CT angiogram confirmed large vessel occlusion of right M1 segment with severe bilateral ICA origin stenosis. Angiogram showed high-grade stenosis of right ICA bifurcation, treated by PTA, mechanical thrombectomy of right M1 performed. Intubated for procedure. Type of Study: Bedside Swallow Evaluation Previous Swallow Assessment: BSE 10/17 Diet Prior to this Study: Regular;Thin liquids Temperature Spikes Noted: No Respiratory  Status: Room air History of Recent Intubation: Yes Length of Intubations (days):  (during procedure only) Date extubated: 11/03/20 Behavior/Cognition: Alert;Cooperative;Pleasant mood Oral Cavity Assessment: Within Functional Limits Oral Care Completed by SLP: No Oral Cavity - Dentition: Edentulous;Dentures, top;Dentures, bottom Vision: Functional for self-feeding Self-Feeding Abilities: Able to feed self Patient Positioning: Upright in chair Baseline Vocal Quality: Normal Volitional Cough: Strong Volitional Swallow: Able to elicit    Oral/Motor/Sensory Function Overall Oral Motor/Sensory Function: Mild impairment Facial ROM: Reduced left Facial Symmetry: Abnormal symmetry left Facial Strength: Reduced left Facial Sensation: Reduced left Lingual ROM: Reduced left Lingual Strength: Reduced Velum: Within Functional Limits Mandible: Within Functional Limits   Ice Chips Ice chips: Not tested   Thin Liquid Thin Liquid: Impaired Presentation: Cup;Straw    Nectar Thick Nectar Thick Liquid: Not tested   Honey Thick Honey Thick Liquid: Not tested   Puree Puree: Not tested   Solid     Solid: Impaired Presentation: Self Fed Oral Phase Impairments: Reduced lingual movement/coordination Oral Phase Functional Implications: Left lateral sulci pocketing;Prolonged oral transit     Dewitt Rota, SLP-Student  Yanitza Shvartsman 11/07/2020,11:26 AM

## 2020-11-07 NOTE — Progress Notes (Signed)
Christopher Lang   INTERVAL HISTORY No family is at the bedside.  Pt is sitting in chair, neuro stable, no acute event overnight.  Had right ICA stenting yesterday, on Plavix and heparin IV, no complaints.  Vitals:   11/07/20 0800 11/07/20 0900 11/07/20 1000 11/07/20 1100  BP: 130/69     Pulse: (!) 57 82 80   Resp: (!) _0 Temp:    (!) 97.5 F (36.4 C)  TempSrc:      SpO2: 98% 96% 96%   Weight:      Height:       CBC:  Recent Labs  Lab 11/03/20 0603 11/03/20 0622 11/06/20 0425 11/07/20 0302  WBC 15.1*   < > 9.5 9.3  NEUTROABS 13.8*  --   --   --   HGB 16.8   < > 12.9* 12.8*  HCT 47.1   < > 37.4* 37.1*  MCV 93.8   < > 96.6 97.9  PLT 210   < > 134* 129*   < > = values in this interval not displayed.   Basic Metabolic Panel:  Recent Labs  Lab 11/06/20 0425 11/07/20 0302  NA 129* 135  K 2.9* 3.4*  CL 100 104  CO2 23 23  GLUCOSE 99 90  BUN 14 11  CREATININE 0.80 0.76  CALCIUM 8.9 9.2   Lipid Panel:  Recent Labs  Lab 11/04/20 0614  CHOL 159  TRIG 187*  HDL 31*  CHOLHDL 5.1  VLDL 37  LDLCALC 91    HgbA1c:  Recent Labs  Lab 11/03/20 1137  HGBA1C 4.9   Urine Drug Screen: No results for input(s): LABOPIA, COCAINSCRNUR, LABBENZ, AMPHETMU, THCU, LABBARB in the last 168 hours.  Alcohol Level No results for input(s): ETH in the last 168 hours.  IMAGING past 24 hours IR INTRAVSC STENT CERV CAROTID W/EMB-PROT MOD SED  Result Date: 11/06/2020 INDICATION: 65 year old male with a history of symptomatic high-grade right carotid stenosis, high risk for surgery given cardiac issues EXAM: ULTRASOUND-GUIDED ACCESS RIGHT COMMON FEMORAL ARTERY RIGHT CAROTID STENT WITH EMBOLIC PROTECTION MEDICATIONS: 8000 UNITS IV HEPARIN. 300 mg p.o. Plavix ANESTHESIA/SEDATION: The anesthesia team was present to provide MAC anesthesia and for patient monitoring during the procedure. Left radial arterial line was performed by the anesthesia team. Interventional neuro  radiology nursing staff was also present. FLUOROSCOPY TIME:  Fluoroscopy Time: 6 minutes 42 seconds (491 mGy). COMPLICATIONS: None TECHNIQUE: Informed written consent was obtained from the patient and the patient's family after a thorough discussion of the procedural risks, benefits and alternatives. Specific risks discussed include: Bleeding, infection, contrast reaction, kidney injury/failure, need for further procedure/surgery, arterial injury or dissection, embolization to new territory, revascularization syndrome, neurologic deterioration, cardiopulmonary collapse, death. All questions were addressed. Maximal Sterile Barrier Technique was utilized including during the procedure including caps, mask, sterile gowns, sterile gloves, sterile drape, hand hygiene and skin antiseptic. A timeout was performed prior to the initiation of the procedure. The anesthesia team was present to provide general endotracheal tube anesthesia and for patient monitoring during the procedure. Interventional neuro radiology nursing staff was also present. PROCEDURE: After the anesthesia team initiated general anesthesia, a time-out was performed. The patient is prepped and draped in the usual sterile fashion, room 2 in the neuro IR suite. Ultrasound survey of the right inguinal region was performed with images stored and sent to PACs. Ultrasound demonstrates patent right common femoral artery. 11 blade scalpel was used to make a small incision. Blunt  dissection was performed with ultrasound guidance. A micropuncture needle was used access the right common femoral artery under ultrasound. With excellent arterial blood flow returned, an .018 micro wire was passed through the needle, observed to enter the abdominal aorta under fluoroscopy. The needle was removed, and a micropuncture sheath was placed over the wire. The inner dilator and wire were removed, and an 035 Bentson wire was advanced under fluoroscopy into the abdominal aorta. The  sheath was removed and a standard 5 [place] vascular sheath was placed. The dilator was removed and the sheath was flushed. A 62F JB-1 diagnostic catheter was advanced over the wire to the proximal descending thoracic aorta. Wire was then removed. Double flush of the catheter was performed. We then attempted four-vessel cerebral angiogram. Given that the patient had a sub-optimal response to MAC anesthesia, and his heart precluded general anesthesia, we elected to abort a complete 4 vessel angiogram. Angiogram of the left cervical carotid system was performed to document status of the right ICA at the bifurcation. Images were reviewed. We then proceeded with our treatment plan. 8000 units of IV heparin was administered. The JB 1 catheter was used to select the innominate artery, directed then into the right common carotid artery. With an adequate catheter position in the common carotid artery, roadmap angiogram was performed. The catheter was then navigated with the assistance of a Glidewire into the external carotid artery branches. Wire was removed and a exchange length wire was placed. Catheter was removed on the Rosen wire. The 5 French sheath was then removed and exchanged for 8 French 55 centimeter BrightTip sheath. Sheath was flushed and attached to pressurized and heparinized saline bag for constant forward flow. Then an 8 [place], 95 cm wall wrist balloon tip catheter was prepared on the back table with inflation of the balloon with 50/50 concentration of dilute contrast. The balloon catheter was then advanced over the wire, positioned into the distal right common carotid artery. Copious back flush was performed and the balloon catheter was attached to heparinized and pressurized saline bag for forward flow. Angiogram was then performed. After review of the images and measurements, we elected to proceed with distal embolic protection using Nav 6 device, 4 - 78m size. We selected an 9-7 x 40 mm Xact stent, with  a plan for pre-dilation 5 mm x 20 mm balloon angioplasty with a Viatrac balloon. These devices were prepped on the back table, including the retrieval device of the Nav 6. Confirmation angiogram was performed for road map guide. The bare wire on the NAV6 was advanced through the stenotic region of the ICA, into a distal position within the cervical ICA. The Nav-6 device was then advanced into a safe location above the carotid lesion with deployment of the Nav 6. Timer was started, and a gentle infusion of contrast confirmed forward flow. The deployment device was removed on the bare wire and was disposed. 5 mm balloon angioplasty was then performed. 10 atmospheres was observed. The patient remained hemodynamically stable throughout this maneuver. Balloon was removed and disposed. The selected stent was then deployed at the lesion with the deployment device removed from the bare wire and disposed. Angiogram was performed. The retrieval device was then passed over the bare wire and the Nav 6 embolic protection device was retrieved. The entire unit was removed from the balloon guide catheter under fluoroscopic observation as this traversed the new stent. Less than 9 minutes of embolic protection open time. Angiogram was performed. We then observed  5 minutes interval and a final angiogram was performed. The 55 centimeter 8 French sheath was removed and 7 [place] Celt was deployed. Patient tolerated the procedure well and remained hemodynamically stable throughout. No complications were encountered and no significant blood loss encountered. FINDINGS: Initial Findings: Left common carotid artery: Normal course caliber and contour with independent origin from the aortic arch. Left external carotid artery: Patent with antegrade flow with moderate atherosclerosis at the origin. Left internal carotid artery: Circumferential calcifications with high-grade stenosis at the origin corresponding to the duplex exam that was recently  performed of 80%-99% stenosis, on the high end. Slow flow through the distal cervical segment of the ICA. Diagnostic images of the distal cervical segment and of the intracranial portion were not capable given the patient's suboptimal response to the anesthesia and motion artifact. Right common carotid artery:  Normal course caliber and contour. Right external carotid artery: Patent with antegrade flow. Right internal carotid artery: Circumferential calcific plaque at the proximal right ICA, with irregular margins and greater than 50% residual stenosis after the previous treatment at the time of the Christopher treatment. Completion: After angioplasty and stenting there is approximately 30% residual stenosis. Right MCA: M1 segment patent. Insular and opercular segments patent. Unremarkable caliber and course of the cortical segments. Typical arterial, capillary/ parenchymal, and venous phase. Right ACA: A 1 segment patent. A 2 segment perfuses the right territory. Patent anterior communicating artery rapidly fills the left hemispheric MCA branches IMPRESSION: Status post ultrasound guided access right common femoral artery for right cervical ICA stenting with embolic protection, for treatment of high-grade symptomatic stenosis, high risk for surgery. Angiogram demonstrates a left cervical ICA critical stenosis. Signed, Dulcy Fanny. Dellia Nims, RPVI [facility] Radiology Electronically Signed   By: Corrie Mckusick D.O.   On: 11/06/2020 16:15   IR US Guide Vasc Access Right  Result Date: 11/06/2020 INDICATION: 65 year old male with a history of symptomatic high-grade right carotid stenosis, high risk for surgery given cardiac issues EXAM: ULTRASOUND-GUIDED ACCESS RIGHT COMMON FEMORAL ARTERY RIGHT CAROTID STENT WITH EMBOLIC PROTECTION MEDICATIONS: 8000 UNITS IV HEPARIN. 300 mg p.o. Plavix ANESTHESIA/SEDATION: The anesthesia team was present to provide MAC anesthesia and for  patient monitoring during the procedure. Left radial arterial line was performed by the anesthesia team. Interventional neuro radiology nursing staff was also present. FLUOROSCOPY TIME:  Fluoroscopy Time: 6 minutes 42 seconds (491 mGy). COMPLICATIONS: None TECHNIQUE: Informed written consent was obtained from the patient and the patient's family after a thorough discussion of the procedural risks, benefits and alternatives. Specific risks discussed include: Bleeding, infection, contrast reaction, kidney injury/failure, need for further procedure/surgery, arterial injury or dissection, embolization to new territory, revascularization syndrome, neurologic deterioration, cardiopulmonary collapse, death. All questions were addressed. Maximal Sterile Barrier Technique was utilized including during the procedure including caps, mask, sterile gowns, sterile gloves, sterile drape, hand hygiene and skin antiseptic. A timeout was performed prior to the initiation of the procedure. The anesthesia team was present to provide general endotracheal tube anesthesia and for patient monitoring during the procedure. Interventional neuro radiology nursing staff was also present. PROCEDURE: After the anesthesia team initiated general anesthesia, a time-out was performed. The patient is prepped and draped in the usual sterile fashion, room 2 in the neuro IR suite. Ultrasound survey of the right inguinal region was performed with images stored and sent to PACs. Ultrasound demonstrates patent right common femoral artery. 11 blade scalpel was used to make a small incision. Blunt dissection was  performed with ultrasound guidance. A micropuncture needle was used access the right common femoral artery under ultrasound. With excellent arterial blood flow returned, an .018 micro wire was passed through the needle, observed to enter the abdominal aorta under fluoroscopy. The needle was removed, and a micropuncture sheath was placed over the wire.  The inner dilator and wire were removed, and an 035 Bentson wire was advanced under fluoroscopy into the abdominal aorta. The sheath was removed and a standard 5 [place] vascular sheath was placed. The dilator was removed and the sheath was flushed. A 37F JB-1 diagnostic catheter was advanced over the wire to the proximal descending thoracic aorta. Wire was then removed. Double flush of the catheter was performed. We then attempted four-vessel cerebral angiogram. Given that the patient had a sub-optimal response to MAC anesthesia, and his heart precluded general anesthesia, we elected to abort a complete 4 vessel angiogram. Angiogram of the left cervical carotid system was performed to document status of the right ICA at the bifurcation. Images were reviewed. We then proceeded with our treatment plan. 8000 units of IV heparin was administered. The JB 1 catheter was used to select the innominate artery, directed then into the right common carotid artery. With an adequate catheter position in the common carotid artery, roadmap angiogram was performed. The catheter was then navigated with the assistance of a Glidewire into the external carotid artery branches. Wire was removed and a exchange length wire was placed. Catheter was removed on the Rosen wire. The 5 French sheath was then removed and exchanged for 8 French 55 centimeter BrightTip sheath. Sheath was flushed and attached to pressurized and heparinized saline bag for constant forward flow. Then an 8 [place], 95 cm wall wrist balloon tip catheter was prepared on the back table with inflation of the balloon with 50/50 concentration of dilute contrast. The balloon catheter was then advanced over the wire, positioned into the distal right common carotid artery. Copious back flush was performed and the balloon catheter was attached to heparinized and pressurized saline bag for forward flow. Angiogram was then performed. After review of the images and measurements, we  elected to proceed with distal embolic protection using Nav 6 device, 4 - 32m size. We selected an 9-7 x 40 mm Xact stent, with a plan for pre-dilation 5 mm x 20 mm balloon angioplasty with a Viatrac balloon. These devices were prepped on the back table, including the retrieval device of the Nav 6. Confirmation angiogram was performed for road map guide. The bare wire on the NAV6 was advanced through the stenotic region of the ICA, into a distal position within the cervical ICA. The Nav-6 device was then advanced into a safe location above the carotid lesion with deployment of the Nav 6. Timer was started, and a gentle infusion of contrast confirmed forward flow. The deployment device was removed on the bare wire and was disposed. 5 mm balloon angioplasty was then performed. 10 atmospheres was observed. The patient remained hemodynamically stable throughout this maneuver. Balloon was removed and disposed. The selected stent was then deployed at the lesion with the deployment device removed from the bare wire and disposed. Angiogram was performed. The retrieval device was then passed over the bare wire and the Nav 6 embolic protection device was retrieved. The entire unit was removed from the balloon guide catheter under fluoroscopic observation as this traversed the new stent. Less than 9 minutes of embolic protection open time. Angiogram was performed. We then observed 5 minutes  interval and a final angiogram was performed. The 55 centimeter 8 French sheath was removed and 7 [place] Celt was deployed. Patient tolerated the procedure well and remained hemodynamically stable throughout. No complications were encountered and no significant blood loss encountered. FINDINGS: Initial Findings: Left common carotid artery: Normal course caliber and contour with independent origin from the aortic arch. Left external carotid artery: Patent with antegrade flow with moderate atherosclerosis at the origin. Left internal carotid  artery: Circumferential calcifications with high-grade stenosis at the origin corresponding to the duplex exam that was recently performed of 80%-99% stenosis, on the high end. Slow flow through the distal cervical segment of the ICA. Diagnostic images of the distal cervical segment and of the intracranial portion were not capable given the patient's suboptimal response to the anesthesia and motion artifact. Right common carotid artery:  Normal course caliber and contour. Right external carotid artery: Patent with antegrade flow. Right internal carotid artery: Circumferential calcific plaque at the proximal right ICA, with irregular margins and greater than 50% residual stenosis after the previous treatment at the time of the Christopher treatment. Completion: After angioplasty and stenting there is approximately 30% residual stenosis. Right MCA: M1 segment patent. Insular and opercular segments patent. Unremarkable caliber and course of the cortical segments. Typical arterial, capillary/ parenchymal, and venous phase. Right ACA: A 1 segment patent. A 2 segment perfuses the right territory. Patent anterior communicating artery rapidly fills the left hemispheric MCA branches IMPRESSION: Status post ultrasound guided access right common femoral artery for right cervical ICA stenting with embolic protection, for treatment of high-grade symptomatic stenosis, high risk for surgery. Angiogram demonstrates a left cervical ICA critical stenosis. Signed, Dulcy Fanny. Dellia Nims, RPVI [facility] Radiology Electronically Signed   By: Corrie Mckusick D.O.   On: 11/06/2020 16:15    PHYSICAL EXAM  Temp:  [97.5 F (36.4 C)-98.9 F (37.2 C)] 97.5 F (36.4 C) (10/20 1100) Pulse Rate:  [55-93] 80 (10/20 1000) Resp:  [9-22] 19 (10/20 1000) BP: (111-173)/(50-92) 130/69 (10/20 0800) SpO2:  [89 %-99 %] 96 % (10/20 1000) Arterial Line BP: (103-154)/(38-70) 121/63 (10/20 1000) Weight:   [93 kg] 93 kg (10/19 1251)  General - Well nourished, well developed, not in acute distress.  Ophthalmologic - fundi not visualized due to noncooperation.  Cardiovascular - Regular rate and rhythm.  Neuro - awake, alert, eyes open, orientated to age, place, time. No aphasia, mild dysarthria, following all simple commands. Able to name and repeat. No gaze palsy, tracking bilaterally, visual field full, PERRL. Slight left nasolabial fold flattening. Tongue midline. BUEs and BLEs equal strength symmetrical. Sensation decreased on the left UE and LE, b/l FTN intact grossly, gait not tested.    ASSESSMENT/PLAN Christopher Lang is a 65 y.o. male with PMH significant for glaucoma, COPD, smoker, HLD, OSA, who presents with CP, nausea, dry heaving, and sudden onset left sided weakness. Found to have anterior STEMI and right MCA occlusion.   Christopher:  right MCA infarct due to right M1 occlusion s/p TNK and IR with TICI3 revascularization, embolic secondary to acute STEMI CT head right M1 hyperdense sign  CTA head & neck right M1 occlusion, severe b/l ICA origin stenosis with underfilling on the left, mod to severe stenosis b/l ICA siphon, 60% left subclavian origin stenosis.  Cerebral angio s/p TICI3 of right M1  MRI Patchy acute cerebral infarcts on the right more than left with right-sided petechial hemorrhage. MRA right MCA patent now. No detected flow  in the intracranial left ICA but well compensated by the left posterior communicating artery. Chronic critical stenosis with underfilling of the left ICA bulb. CUS right ICA 80-99% stenosis, left ICA hard to measure due to shadowing  2D Echo EF 20-25%. The mid to distal septum/anterior segments are akinetic. The entire apex is akinetic. The myocardium appears thin. Suspect these findings represent a large wrap around LAD infarction (troponin >24,000). No LV thrombus on contrast imaging. LDL 77 HgbA1c 5.0 VTE prophylaxis - SCDs aspirin 81 mg daily and  pletal 100 bid prior to admission, now on heparin IV and plavix.  Will transition to Eliquis and continue Plavix. Therapy recommendations:  CIR Disposition:  pending  STEMI Cardiomyopathy  Anteriorlateral MI / STEMI on EKG Trop > 24,000 2D Echo EF 20-25%. The mid to distal septum/anterior segments are akinetic. The entire apex is akinetic. The myocardium appears thin. Suspect these findings represent a large wrap around LAD infarction (troponin >24,000). No LV thrombus on contrast imaging. Cardiology on board, appreciate help On heparin IV and plavix, will transition to Eliquis and continue Plavix. On metoprolol 15m bid->12.541mbid due to brady On Entresto and spironolactone  Carotid stenosis CTA head and neck - evere b/l ICA origin stenosis with underfilling on the left MRA No detected flow in the intracranial left ICA but well compensated by the left posterior communicating artery. Chronic critical stenosis with underfilling of the left ICA bulb. CUS right ICA 80-99% stenosis, left ICA hard to measure due to shadowing On heparin IV and plavix now, will transition to Eliquis and continue Plavix Had right ICA stenting 10/19 with Dr. WaEarleen Newportlan for left carotid artery revascularization in a later date  Hypertension Hypotension  Home meds:  cardizem Initial hypotension -> hypertension  Off Neo and cleviprex On metoprolol 25 bid->12.5 bid On Entresto 24/26 and spironolactone 12.5 Stable now at 110-130 Avoid low BP Long-term BP goal normotensive  Hyperlipidemia Home meds:  lipitor 10 LDL 77, goal < 70 Increase lipitor to 40  Continue statin at discharge  Tobacco abuse Current smoker Smoking cessation counseling will be provided  Other Christopher Risk Factors Advanced Age >/= 6528Obstructive sleep apnea, on CPAP at home Congestive heart failure  Other Active Problems Leukocytosis WBC 17.3->12.3->9.5-> 9.3 COPD  Hospital day # 4  This patient is critically ill due to  STEMI, right MCA occlusion status post TNKase and thrombectomy, bilateral ICA high-grade stenosis status post right ICA stent, cardiomyopathy and at significant risk of neurological worsening, death form heart failure, cardiac arrest, recurrent Christopher, hemorrhagic conversion. This patient's care requires constant monitoring of vital signs, hemodynamics, respiratory and cardiac monitoring, review of multiple databases, neurological assessment, discussion with family, other specialists and medical decision making of high complexity. I spent 35 minutes of neurocritical care time in the care of this patient.   JiRosalin HawkingMD PhD [facility] 11/07/2020 12:16 PM   To contact Christopher Continuity provider, please refer to [email or web address], contact General Neurology

## 2020-11-08 ENCOUNTER — Other Ambulatory Visit (HOSPITAL_COMMUNITY): Payer: Self-pay

## 2020-11-08 DIAGNOSIS — I5021 Acute systolic (congestive) heart failure: Secondary | ICD-10-CM | POA: Diagnosis not present

## 2020-11-08 DIAGNOSIS — I639 Cerebral infarction, unspecified: Secondary | ICD-10-CM | POA: Diagnosis not present

## 2020-11-08 DIAGNOSIS — I2102 ST elevation (STEMI) myocardial infarction involving left anterior descending coronary artery: Secondary | ICD-10-CM | POA: Diagnosis not present

## 2020-11-08 DIAGNOSIS — I10 Essential (primary) hypertension: Secondary | ICD-10-CM | POA: Diagnosis not present

## 2020-11-08 LAB — CBC
HCT: 35.8 % — ABNORMAL LOW (ref 39.0–52.0)
Hemoglobin: 12.4 g/dL — ABNORMAL LOW (ref 13.0–17.0)
MCH: 33.1 pg (ref 26.0–34.0)
MCHC: 34.6 g/dL (ref 30.0–36.0)
MCV: 95.5 fL (ref 80.0–100.0)
Platelets: 137 10*3/uL — ABNORMAL LOW (ref 150–400)
RBC: 3.75 MIL/uL — ABNORMAL LOW (ref 4.22–5.81)
RDW: 12 % (ref 11.5–15.5)
WBC: 9 10*3/uL (ref 4.0–10.5)
nRBC: 0 % (ref 0.0–0.2)

## 2020-11-08 LAB — BASIC METABOLIC PANEL
Anion gap: 6 (ref 5–15)
BUN: 11 mg/dL (ref 8–23)
CO2: 24 mmol/L (ref 22–32)
Calcium: 9.2 mg/dL (ref 8.9–10.3)
Chloride: 105 mmol/L (ref 98–111)
Creatinine, Ser: 0.82 mg/dL (ref 0.61–1.24)
GFR, Estimated: >60 mL/min (ref >60)
Glucose, Bld: 102 mg/dL — ABNORMAL HIGH (ref 70–99)
Potassium: 3.1 mmol/L — ABNORMAL LOW (ref 3.5–5.1)
Sodium: 135 mmol/L (ref 135–145)

## 2020-11-08 LAB — HEPARIN LEVEL (UNFRACTIONATED): Heparin Unfractionated: 1.07 IU/mL — ABNORMAL HIGH (ref 0.30–0.70)

## 2020-11-08 MED ORDER — ATORVASTATIN CALCIUM 40 MG PO TABS
40.0000 mg | ORAL_TABLET | Freq: Every day | ORAL | 0 refills | Status: DC
Start: 2020-11-08 — End: 2021-07-23

## 2020-11-08 MED ORDER — SACUBITRIL-VALSARTAN 24-26 MG PO TABS: 1.0000 | ORAL_TABLET | Freq: Two times a day (BID) | ORAL | 0 refills | Status: DC

## 2020-11-08 MED ORDER — CLOPIDOGREL BISULFATE 75 MG PO TABS: 75.0000 mg | ORAL_TABLET | Freq: Every day | ORAL | 0 refills | Status: AC

## 2020-11-08 MED ORDER — METOPROLOL TARTRATE 25 MG PO TABS: 12.5000 mg | ORAL_TABLET | Freq: Two times a day (BID) | ORAL | 0 refills | Status: DC

## 2020-11-08 MED ORDER — APIXABAN 5 MG PO TABS: 5.0000 mg | ORAL_TABLET | Freq: Two times a day (BID) | ORAL | 0 refills | Status: DC

## 2020-11-08 MED ORDER — VARENICLINE TARTRATE 0.5 MG PO TABS: 0.5000 mg | ORAL_TABLET | Freq: Two times a day (BID) | ORAL | 0 refills | Status: DC

## 2020-11-08 MED ORDER — ACETAMINOPHEN 325 MG PO TABS: 650.0000 mg | ORAL_TABLET | ORAL | 0 refills | Status: DC | PRN

## 2020-11-08 MED ORDER — SENNOSIDES-DOCUSATE SODIUM 8.6-50 MG PO TABS: 1.0000 | ORAL_TABLET | Freq: Every evening | ORAL | 0 refills | Status: DC | PRN

## 2020-11-08 MED ORDER — NICOTINE 14 MG/24HR TD PT24: 14.0000 mg | MEDICATED_PATCH | Freq: Every day | TRANSDERMAL | 0 refills | Status: DC

## 2020-11-08 MED ORDER — SPIRONOLACTONE 25 MG PO TABS: 12.5000 mg | ORAL_TABLET | Freq: Every day | ORAL | 0 refills | Status: DC

## 2020-11-08 MED ORDER — PNEUMOCOCCAL 13-VAL CONJ VACC IM SUSP
0.5000 mL | INTRAMUSCULAR | Status: AC
  Administered 2020-11-08: 0.5 mL via INTRAMUSCULAR
  Filled 2020-11-08: qty 0.5

## 2020-11-08 MED ORDER — INFLUENZA VAC SPLIT QUAD 0.5 ML IM SUSY
0.5000 mL | PREFILLED_SYRINGE | INTRAMUSCULAR | Status: AC
  Administered 2020-11-08: 0.5 mL via INTRAMUSCULAR

## 2020-11-08 NOTE — Discharge Summary (Addendum)
Stroke Discharge Summary  Patient ID: Christopher Lang   MRN: [id number]      DOB: [birth year]  Date of Admission: 11/03/2020 Date of Discharge: 11/08/2020  Attending Physician:  Stroke, Md, MD, Stroke MD Consultant(s):   Treatment Team:  [facility]bcardiology, Rounding, MD cardiology and pulmonary/intensive care   Patient's PCP:  Dettinger, Fransisca Kaufmann, MD  DISCHARGE DIAGNOSIS:   Principal Problem:   Acute right MCA stroke due to right M1 occlusion s/p TNK and IR with TICI3 revascularization   Acute ST elevation myocardial infarction (STEMI) [facility]) Active Problems:   Primary hypertension   Carotid stenosis, bilateral, s/p right ICA stenting   Cardiomyopathy    Smoker   Hypotension    OSA on CPAP   Leukocytosis    Allergies as of 11/08/2020   No Known Allergies      Medication List     STOP taking these medications    aspirin EC 81 MG tablet   cilostazol 100 MG tablet Commonly known as: PLETAL   diltiazem 240 MG 24 hr capsule Commonly known as: CARDIZEM CD   pantoprazole 40 MG tablet Commonly known as: PROTONIX       TAKE these medications    apixaban 5 MG Tabs tablet Commonly known as: ELIQUIS Take 1 tablet (5 mg total) by mouth 2 (two) times daily.   atorvastatin 40 MG tablet Commonly known as: LIPITOR Take 1 tablet (40 mg total) by mouth daily at 6 PM. What changed:  medication strength how much to take   cholecalciferol 1000 units tablet Commonly known as: VITAMIN D Take 1,000 Units by mouth daily.   clopidogrel 75 MG tablet Commonly known as: PLAVIX Take 1 tablet (75 mg total) by mouth daily. Start taking on: November 09, 2020   fish oil-omega-3 fatty acids 1000 MG capsule Take 1,000 mg by mouth daily.   metoprolol tartrate 25 MG tablet Commonly known as: LOPRESSOR Take 0.5 tablets (12.5 mg total) by mouth 2 (two) times daily.   nicotine 14 mg/24hr patch Commonly known as: NICODERM CQ - dosed in mg/24 hours Place 1 patch (14 mg total) onto the  skin daily. Start taking on: November 09, 2020   sacubitril-valsartan 24-26 MG Commonly known as: ENTRESTO Take 1 tablet by mouth 2 (two) times daily.   spironolactone 25 MG tablet Commonly known as: ALDACTONE Take 0.5 tablets (12.5 mg total) by mouth daily. Start taking on: November 09, 2020   varenicline 0.5 MG tablet Commonly known as: CHANTIX Take 1 tablet (0.5 mg total) by mouth 2 (two) times daily.        LABORATORY STUDIES CBC    Component Value Date/Time   WBC 9.0 11/08/2020 0145   RBC 3.75 (L) 11/08/2020 0145   HGB 12.4 (L) 11/08/2020 0145   HGB 14.1 06/23/2018 1445   HCT 35.8 (L) 11/08/2020 0145   HCT 38.5 06/23/2018 1445   PLT 137 (L) 11/08/2020 0145   PLT 240 06/23/2018 1445   MCV 95.5 11/08/2020 0145   MCV 94 06/23/2018 1445   MCH 33.1 11/08/2020 0145   MCHC 34.6 11/08/2020 0145   RDW 12.0 11/08/2020 0145   RDW 11.9 06/23/2018 1445   LYMPHSABS 0.7 11/03/2020 0603   LYMPHSABS 2.9 06/23/2018 1445   MONOABS 0.6 11/03/2020 0603   EOSABS 0.0 11/03/2020 0603   EOSABS 0.1 06/23/2018 1445   BASOSABS 0.0 11/03/2020 0603   BASOSABS 0.0 06/23/2018 1445   CMP    Component Value Date/Time   NA  135 11/08/2020 0145   NA 139 06/23/2018 1445   K 3.1 (L) 11/08/2020 0145   CL 105 11/08/2020 0145   CO2 24 11/08/2020 0145   GLUCOSE 102 (H) 11/08/2020 0145   BUN 11 11/08/2020 0145   BUN 12 06/23/2018 1445   CREATININE 0.82 11/08/2020 0145   CREATININE 1.41 (H) 08/15/2019 1002   CALCIUM 9.2 11/08/2020 0145   PROT 7.5 11/03/2020 0603   PROT 6.7 06/23/2018 1445   ALBUMIN 4.0 11/03/2020 0603   ALBUMIN 4.4 06/23/2018 1445   AST 83 (H) 11/03/2020 0603   ALT 30 11/03/2020 0603   ALKPHOS 131 (H) 11/03/2020 0603   BILITOT 1.8 (H) 11/03/2020 0603   BILITOT 0.4 06/23/2018 1445   GFRNONAA >60 11/08/2020 0145   GFRNONAA >89 09/03/2016 0942   GFRAA 95 06/23/2018 1445   GFRAA >89 09/03/2016 0942   COAGS Lab Results  Component Value Date   INR 1.0 11/03/2020    Lipid Panel    Component Value Date/Time   CHOL 159 11/04/2020 0614   CHOL 160 06/23/2018 1445   TRIG 187 (H) 11/04/2020 0614   HDL 31 (L) 11/04/2020 0614   HDL 29 (L) 06/23/2018 1445   CHOLHDL 5.1 11/04/2020 0614   VLDL 37 11/04/2020 0614   LDLCALC 91 11/04/2020 0614   LDLCALC 77 08/15/2019 1002   HgbA1C  Lab Results  Component Value Date   HGBA1C 4.9 11/03/2020   Urinalysis    Component Value Date/Time   COLORURINE DARK YELLOW 09/03/2016 [place] 09/03/2016 0942   LABSPEC 1.020 09/03/2016 0942   PHURINE 7.0 09/03/2016 [place] 09/03/2016 0942   HGBUR NEGATIVE 09/03/2016 0942   BILIRUBINUR NEGATIVE 09/03/2016 0942   KETONESUR NEGATIVE 09/03/2016 0942   PROTEINUR NEGATIVE 09/03/2016 0942   UROBILINOGEN 1.0 10/31/2013 2104   NITRITE NEGATIVE 09/03/2016 0942   LEUKOCYTESUR NEGATIVE 09/03/2016 0942   Urine Drug Screen No results found for: LABOPIA, COCAINSCRNUR, LABBENZ, AMPHETMU, THCU, LABBARB  Alcohol Level No results found for: ETH   SIGNIFICANT DIAGNOSTIC STUDIES MR ANGIO HEAD WO CONTRAST  Result Date: 11/04/2020 CLINICAL DATA:  Stroke follow-up EXAM: MRI HEAD WITHOUT CONTRAST MRA HEAD WITHOUT CONTRAST TECHNIQUE: Multiplanar, multi-echo pulse sequences of the brain and surrounding structures were acquired without intravenous contrast. Angiographic images of the Circle of Willis were acquired using MRA technique without intravenous contrast. COMPARISON:  No pertinent prior exam. FINDINGS: MRI HEAD FINDINGS Brain: Patchy acute cortical infarct along the right MCA and border zone territory but also seen to a lesser extent in the white matter and striatum on the right. Less extensive acute infarcts in the lateral left frontal lobe, MCA territory. Pre-existing cortical infarcts asymmetric to the left. Petechial hemorrhage on the right and hemosiderin staining on the left. No hydrocephalus, collection, or shift Vascular: Absent left ICA flow  void Skull and upper cervical spine: Normal marrow signal Sinuses/Orbits: Negative MRA HEAD FINDINGS Anterior circulation: Faint flow related signal in the left cervical ICA but none seen at the skull base or intracranially until ramification by the left posterior communicating artery. The right M1 and M2 branches are widely patent. No new embolus. Posterior circulation: Dominant right vertebral artery. The distal basilar divides at the level of the superior cerebellar arteries. No branch occlusion or aneurysm Anatomic variants: None significant IMPRESSION: 1. Patchy acute cerebral infarcts on the right more than left with right-sided petechial hemorrhage. 2. No residual or recurrent right MCA thrombus. 3. No detected flow in the intracranial  left ICA but well compensated by the left posterior communicating artery. Chronic critical stenosis with underfilling of the left ICA bulb. Electronically Signed   By: Jorje Guild M.D.   On: 11/04/2020 08:09   MR BRAIN WO CONTRAST  Result Date: 11/04/2020 CLINICAL DATA:  Stroke follow-up EXAM: MRI HEAD WITHOUT CONTRAST MRA HEAD WITHOUT CONTRAST TECHNIQUE: Multiplanar, multi-echo pulse sequences of the brain and surrounding structures were acquired without intravenous contrast. Angiographic images of the Circle of Willis were acquired using MRA technique without intravenous contrast. COMPARISON:  No pertinent prior exam. FINDINGS: MRI HEAD FINDINGS Brain: Patchy acute cortical infarct along the right MCA and border zone territory but also seen to a lesser extent in the white matter and striatum on the right. Less extensive acute infarcts in the lateral left frontal lobe, MCA territory. Pre-existing cortical infarcts asymmetric to the left. Petechial hemorrhage on the right and hemosiderin staining on the left. No hydrocephalus, collection, or shift Vascular: Absent left ICA flow void Skull and upper cervical spine: Normal marrow signal Sinuses/Orbits: Negative MRA HEAD  FINDINGS Anterior circulation: Faint flow related signal in the left cervical ICA but none seen at the skull base or intracranially until ramification by the left posterior communicating artery. The right M1 and M2 branches are widely patent. No new embolus. Posterior circulation: Dominant right vertebral artery. The distal basilar divides at the level of the superior cerebellar arteries. No branch occlusion or aneurysm Anatomic variants: None significant IMPRESSION: 1. Patchy acute cerebral infarcts on the right more than left with right-sided petechial hemorrhage. 2. No residual or recurrent right MCA thrombus. 3. No detected flow in the intracranial left ICA but well compensated by the left posterior communicating artery. Chronic critical stenosis with underfilling of the left ICA bulb. Electronically Signed   By: Jorje Guild M.D.   On: 11/04/2020 08:09   IR INTRAVSC STENT CERV CAROTID W/EMB-PROT MOD SED  Result Date: 11/06/2020 INDICATION: 65 year old male with a history of symptomatic high-grade right carotid stenosis, high risk for surgery given cardiac issues EXAM: ULTRASOUND-GUIDED ACCESS RIGHT COMMON FEMORAL ARTERY RIGHT CAROTID STENT WITH EMBOLIC PROTECTION MEDICATIONS: 8000 UNITS IV HEPARIN. 300 mg p.o. Plavix ANESTHESIA/SEDATION: The anesthesia team was present to provide MAC anesthesia and for patient monitoring during the procedure. Left radial arterial line was performed by the anesthesia team. Interventional neuro radiology nursing staff was also present. FLUOROSCOPY TIME:  Fluoroscopy Time: 6 minutes 42 seconds (491 mGy). COMPLICATIONS: None TECHNIQUE: Informed written consent was obtained from the patient and the patient's family after a thorough discussion of the procedural risks, benefits and alternatives. Specific risks discussed include: Bleeding, infection, contrast reaction, kidney injury/failure, need for further procedure/surgery, arterial injury or dissection, embolization to new  territory, revascularization syndrome, neurologic deterioration, cardiopulmonary collapse, death. All questions were addressed. Maximal Sterile Barrier Technique was utilized including during the procedure including caps, mask, sterile gowns, sterile gloves, sterile drape, hand hygiene and skin antiseptic. A timeout was performed prior to the initiation of the procedure. The anesthesia team was present to provide general endotracheal tube anesthesia and for patient monitoring during the procedure. Interventional neuro radiology nursing staff was also present. PROCEDURE: After the anesthesia team initiated general anesthesia, a time-out was performed. The patient is prepped and draped in the usual sterile fashion, room 2 in the neuro IR suite. Ultrasound survey of the right inguinal region was performed with images stored and sent to PACs. Ultrasound demonstrates patent right common femoral artery. 11 blade scalpel was used to make a  small incision. Blunt dissection was performed with ultrasound guidance. A micropuncture needle was used access the right common femoral artery under ultrasound. With excellent arterial blood flow returned, an .018 micro wire was passed through the needle, observed to enter the abdominal aorta under fluoroscopy. The needle was removed, and a micropuncture sheath was placed over the wire. The inner dilator and wire were removed, and an 035 Bentson wire was advanced under fluoroscopy into the abdominal aorta. The sheath was removed and a standard 5 [place] vascular sheath was placed. The dilator was removed and the sheath was flushed. A 1F JB-1 diagnostic catheter was advanced over the wire to the proximal descending thoracic aorta. Wire was then removed. Double flush of the catheter was performed. We then attempted four-vessel cerebral angiogram. Given that the patient had a sub-optimal response to MAC anesthesia, and his heart precluded general anesthesia, we elected to abort a complete 4  vessel angiogram. Angiogram of the left cervical carotid system was performed to document status of the right ICA at the bifurcation. Images were reviewed. We then proceeded with our treatment plan. 8000 units of IV heparin was administered. The JB 1 catheter was used to select the innominate artery, directed then into the right common carotid artery. With an adequate catheter position in the common carotid artery, roadmap angiogram was performed. The catheter was then navigated with the assistance of a Glidewire into the external carotid artery branches. Wire was removed and a exchange length wire was placed. Catheter was removed on the Rosen wire. The 5 French sheath was then removed and exchanged for 8 French 55 centimeter BrightTip sheath. Sheath was flushed and attached to pressurized and heparinized saline bag for constant forward flow. Then an 8 [place], 95 cm wall wrist balloon tip catheter was prepared on the back table with inflation of the balloon with 50/50 concentration of dilute contrast. The balloon catheter was then advanced over the wire, positioned into the distal right common carotid artery. Copious back flush was performed and the balloon catheter was attached to heparinized and pressurized saline bag for forward flow. Angiogram was then performed. After review of the images and measurements, we elected to proceed with distal embolic protection using Nav 6 device, 4 - 39m size. We selected an 9-7 x 40 mm Xact stent, with a plan for pre-dilation 5 mm x 20 mm balloon angioplasty with a Viatrac balloon. These devices were prepped on the back table, including the retrieval device of the Nav 6. Confirmation angiogram was performed for road map guide. The bare wire on the NAV6 was advanced through the stenotic region of the ICA, into a distal position within the cervical ICA. The Nav-6 device was then advanced into a safe location above the carotid lesion with deployment of the Nav 6. Timer was started,  and a gentle infusion of contrast confirmed forward flow. The deployment device was removed on the bare wire and was disposed. 5 mm balloon angioplasty was then performed. 10 atmospheres was observed. The patient remained hemodynamically stable throughout this maneuver. Balloon was removed and disposed. The selected stent was then deployed at the lesion with the deployment device removed from the bare wire and disposed. Angiogram was performed. The retrieval device was then passed over the bare wire and the Nav 6 embolic protection device was retrieved. The entire unit was removed from the balloon guide catheter under fluoroscopic observation as this traversed the new stent. Less than 9 minutes of embolic protection open time. Angiogram was performed.  We then observed 5 minutes interval and a final angiogram was performed. The 55 centimeter 8 French sheath was removed and 7 [place] Celt was deployed. Patient tolerated the procedure well and remained hemodynamically stable throughout. No complications were encountered and no significant blood loss encountered. FINDINGS: Initial Findings: Left common carotid artery: Normal course caliber and contour with independent origin from the aortic arch. Left external carotid artery: Patent with antegrade flow with moderate atherosclerosis at the origin. Left internal carotid artery: Circumferential calcifications with high-grade stenosis at the origin corresponding to the duplex exam that was recently performed of 80%-99% stenosis, on the high end. Slow flow through the distal cervical segment of the ICA. Diagnostic images of the distal cervical segment and of the intracranial portion were not capable given the patient's suboptimal response to the anesthesia and motion artifact. Right common carotid artery:  Normal course caliber and contour. Right external carotid artery: Patent with antegrade flow. Right internal carotid artery: Circumferential calcific plaque at the proximal  right ICA, with irregular margins and greater than 50% residual stenosis after the previous treatment at the time of the stroke treatment. Completion: After angioplasty and stenting there is approximately 30% residual stenosis. Right MCA: M1 segment patent. Insular and opercular segments patent. Unremarkable caliber and course of the cortical segments. Typical arterial, capillary/ parenchymal, and venous phase. Right ACA: A 1 segment patent. A 2 segment perfuses the right territory. Patent anterior communicating artery rapidly fills the left hemispheric MCA branches IMPRESSION: Status post ultrasound guided access right common femoral artery for right cervical ICA stenting with embolic protection, for treatment of high-grade symptomatic stenosis, high risk for surgery. Angiogram demonstrates a left cervical ICA critical stenosis. Signed, Dulcy Fanny. Dellia Nims, RPVI [facility] Electronically Signed   By: Corrie Mckusick D.O.   On: 11/06/2020 16:15   IR PTA Intracranial  Result Date: 11/04/2020 INDICATION: 65 year old male presents with acute right M1 occlusion, emergent large vessel occlusion, for mechanical thrombectomy EXAM: ULTRASOUND-GUIDED ACCESS RIGHT COMMON FEMORAL ARTERY ULTRASOUND-GUIDED ACCESS LEFT COMMON FEMORAL ARTERY RIGHT CERVICAL AND CEREBRAL ANGIOGRAM BALLOON ANGIOPLASTY CRITICAL STENOSIS RIGHT ICA RIGHT M1 MECHANICAL THROMBECTOMY ANGIO-SEAL FOR HEMOSTASIS. COMPARISON:  CT IMAGING SAME DAY MEDICATIONS: Preoperative T NK ANESTHESIA/SEDATION: The anesthesia team was present to provide general endotracheal tube anesthesia and for patient monitoring during the procedure. Intubation was performed in biplane/neuro interventional suite. Left radial arterial line was performed by the anesthesia team. Interventional neuro radiology nursing staff was also present. CONTRAST:  70 cc omni 350 FLUOROSCOPY TIME:  Fluoroscopy Time: 17 minutes 48 seconds  (1029 mGy). COMPLICATIONS: None TECHNIQUE: Informed written consent was obtained from the patient's family after a thorough discussion of the procedural risks, benefits and alternatives. Specific risks discussed include: Bleeding, infection, contrast reaction, kidney injury/failure, need for further procedure/surgery, arterial injury or dissection, embolization to new territory, intracranial hemorrhage (10-15% risk), neurologic deterioration, cardiopulmonary collapse, death. All questions were addressed. Maximal Sterile Barrier Technique was utilized including during the procedure including caps, mask, sterile gowns, sterile gloves, sterile drape, hand hygiene and skin antiseptic. A timeout was performed prior to the initiation of the procedure. The anesthesia team was present to provide general endotracheal tube anesthesia and for patient monitoring during the procedure. Interventional neuro radiology nursing staff was also present. FINDINGS: Initial Findings: Right common carotid artery:  Normal course caliber and contour. Right external carotid artery: Patent with antegrade flow. Atherosclerotic plaque extends into the proximal ECA with estimated 50 percent narrowing at the origin.  Right internal carotid artery: Advanced atherosclerotic changes at the right carotid bifurcation, with at least 50 percent circumferential calcified plaque in the proximal right ICA, and extension of plaque into the proximal right external carotid artery. By NASCET criteria, the initial stenosis measures at least 81 percent . After balloon angioplasty, stenosis measures less than 50 percent. Right MCA: Since the prior CT, there has been migration of the thrombus into the inferior division, with a superior division frontal branch filling. There is some contamination on the initial angiogram by the left-sided MCA branches given the significant right to left cross fill of the patent anterior communicating artery. Right ACA: A 1 segment  patent. A 2 segment perfuses the right territory. Significant cross fill to the left. Completion Findings: Right MCA: TICI 3: Complete perfusion of the territory Significant cross fill persists from the right to the left. Less than 50 percent stenosis at the cervical ICA origin. PROCEDURE: The anesthesia team was present to provide general endotracheal tube anesthesia and for patient monitoring during the procedure. Intubation was performed in negative pressure [place] in neuro IR holding. Interventional neuro radiology nursing staff was also present. Ultrasound survey of the left inguinal region was performed with images stored and sent to PACs, confirming patency of the vessel. A micropuncture needle was used access the left common femoral artery under ultrasound. With excellent arterial blood flow returned, and an .018 micro wire was passed through the needle, observed enter the abdominal aorta under fluoroscopy. The needle was removed, and a micropuncture sheath was placed over the wire. The inner dilator and wire were removed, and an 035 Bentson wire was advanced under fluoroscopy into the abdominal aorta. The sheath was removed and a standard 4 [place] vascular sheath was placed. The dilator was removed and the sheath was flushed. Ultrasound survey of the right inguinal region was performed with images stored and sent to PACs. Patency of the right common femoral artery confirmed. 11 blade scalpel was used to make a small incision. Blunt dissection was performed with US guidance. A micropuncture needle was used access the right common femoral artery under ultrasound. With excellent arterial blood flow returned, an .018 micro wire was passed through the needle, observed to enter the abdominal aorta under fluoroscopy. The needle was removed, and a micropuncture sheath was placed over the wire. The inner dilator and wire were removed, and an 035 wire was advanced under fluoroscopy into the abdominal aorta. The sheath  was removed and a 25cm 9F straight vascular sheath was placed. The dilator was removed and the sheath was flushed. Sheath was attached to pressurized and heparinized saline bag for constant forward flow. JB 1 catheter was advanced with a Glidewire to the aortic arch. The Glidewire was removed and a double flush was performed. JB 1 was then used to select the innominate artery and the right common carotid artery origin. Catheter was advanced to the distal common carotid artery and the wire was removed. Angiogram was performed. Once we assessed the significant stenosis in the proximal right ICA, we elected to proceed 1st with balloon angioplasty. Exchange length roadrunner was advanced into the external carotid artery branches and the JB 1 was removed. A coaxial system was then advanced over the 035 wire. This included a 95cm 087 "[place]" balloon guide with coaxial 100cm JB 1 diagnostic catheter. This was advanced to the distal common carotid artery. Wire and catheter were then removed. Double flush of the balloon guide was performed. Angiogram was performed for roadmap technique.  Using the roadmap technique, A synchro soft wire and rapid transit catheter were used to cross the irregular stenotic plaque of the right ICA. Catheter wire were advanced into the distal cervical ICA and the wire was removed. A floppy transcend exchange length wire was then placed through the catheter and the catheter was removed. We then proceeded with balloon angioplasty of the cervical ICA segment with a 5 mm x 30 mm Viatrac rapid exchange system. Ten atmospheric pressure was observed. As the balloon was slowly deflated, the balloon guide was advanced over the balloon and wire combination to the distal cervical segment. The balloon and wire were then removed from the system and double flush was performed at the balloon guide. Formal angiogram was performed. Road map function was used once the occluded vessel was identified. Copious back  flush was performed and the balloon catheter was attached to heparinized and pressurized saline bag for forward flow. A second coaxial system was then advanced through the balloon catheter, which included the selected intermediate catheter, microcatheter, and microwire. In this scenario, the set up included a 132cm CAT-5 intermediate catheter, a Trevo Provue18 microcatheter, and 014 synchro soft wire. This system was advanced through the balloon guide catheter under the road-map function, with adequate back-flush at the rotating hemostatic valve at that back end of the balloon guide. Microcatheter and the intermediate catheter system were advanced through the terminal ICA and MCA to the level of the occlusion. The micro wire was then carefully advanced through the occluded segment. Microcatheter was then manipulated through the occluded segment and the wire was removed with saline drip at the hub. Intermediate catheter was advanced into the proximal M1 segment. Blood was then aspirated through the hub of the microcatheter, and a gentle contrast injection was performed confirming intraluminal position. A rotating hemostatic valve was then attached to the back end of the microcatheter, and a pressurized and heparinized saline bag was attached to the catheter. 4 x 40 solitaire device was then selected. Back flush was achieved at the rotating hemostatic valve, and then the device was gently advanced through the microcatheter to the distal end. The retriever was then unsheathed by withdrawing the microcatheter under fluoroscopy. Once the retriever was completely unsheathed, the microcatheter was carefully stripped from the delivery device. Control angiogram was performed from the intermediate catheter. A 3 minute time interval was observed. The intermediate catheter was then attached to the proprietary suction engine and the catheter was slowly advanced over the solitaire until we observed stasis of flow within the  intermediate catheter. The balloon at the balloon guide catheter was then inflated under fluoroscopy for proximal flow arrest. Constant aspiration using the proprietary engine was then performed at the intermediate catheter, as the retriever was gently and slowly withdrawn with fluoroscopic observation. Once the retriever was "corked" within the tip of the intermediate catheter, both were removed from the system. Free aspiration was confirmed at the hub of the balloon guide catheter, with free blood return confirmed. The balloon was then deflated, and a control angiogram was performed. Restoration of flow was confirmed. Balloon guide was withdrawn to the common carotid artery segment after placing the transcend wire to the distal cervical segment, to maintain wire cross the ICA origin. Angiogram of the cervical ICA was performed. Given the improved diameter on the conclusion, we elected not to deployed stent at this time, as that would require dual anti-platelet therapy. Balloon guide and wire were then removed. The skin at the puncture site was then  cleaned with Chlorhexidine. The 8 French sheath was removed and an 42F angioseal was deployed. Flat panel CT was performed. Patient tolerated the procedure well and remained hemodynamically stable throughout. No complications were encountered and no significant blood loss encountered. IMPRESSION: Status post ultrasound guided access right common femoral artery for right-sided cervical and cerebral angiogram, balloon angioplasty of high-grade right ICA stenosis and mechanical thrombectomy of right M1 occlusion, achieving TICI 3 flow. Angio-Seal for hemostasis. Status post ultrasound-guided access left common femoral artery for placement of arterial monitoring line for anesthesia/critical care. Signed, Dulcy Fanny. Dellia Nims, RPVI [facility] Radiology PLAN: The patient will remain intubated, given the critical care status ICU  status Target systolic blood pressure of 120-140 Right hip straight time 6 hours Left common femoral artery monitoring line. Frequent neurovascular checks Repeat neurologic imaging with CT and/MRI at the discretion of neurology team Electronically Signed   By: Corrie Mckusick D.O.   On: 11/04/2020 08:46   IR [facility]  Result Date: 11/04/2020 INDICATION: 65 year old male presents with acute right M1 occlusion, emergent large vessel occlusion, for mechanical thrombectomy EXAM: ULTRASOUND-GUIDED ACCESS RIGHT COMMON FEMORAL ARTERY ULTRASOUND-GUIDED ACCESS LEFT COMMON FEMORAL ARTERY RIGHT CERVICAL AND CEREBRAL ANGIOGRAM BALLOON ANGIOPLASTY CRITICAL STENOSIS RIGHT ICA RIGHT M1 MECHANICAL THROMBECTOMY ANGIO-SEAL FOR HEMOSTASIS. COMPARISON:  CT IMAGING SAME DAY MEDICATIONS: Preoperative T NK ANESTHESIA/SEDATION: The anesthesia team was present to provide general endotracheal tube anesthesia and for patient monitoring during the procedure. Intubation was performed in biplane/neuro interventional suite. Left radial arterial line was performed by the anesthesia team. Interventional neuro radiology nursing staff was also present. CONTRAST:  70 cc omni 350 FLUOROSCOPY TIME:  Fluoroscopy Time: 17 minutes 48 seconds (1029 mGy). COMPLICATIONS: None TECHNIQUE: Informed written consent was obtained from the patient's family after a thorough discussion of the procedural risks, benefits and alternatives. Specific risks discussed include: Bleeding, infection, contrast reaction, kidney injury/failure, need for further procedure/surgery, arterial injury or dissection, embolization to new territory, intracranial hemorrhage (10-15% risk), neurologic deterioration, cardiopulmonary collapse, death. All questions were addressed. Maximal Sterile Barrier Technique was utilized including during the procedure including caps, mask, sterile gowns, sterile gloves, sterile drape, hand hygiene and skin antiseptic. A timeout was performed prior to  the initiation of the procedure. The anesthesia team was present to provide general endotracheal tube anesthesia and for patient monitoring during the procedure. Interventional neuro radiology nursing staff was also present. FINDINGS: Initial Findings: Right common carotid artery:  Normal course caliber and contour. Right external carotid artery: Patent with antegrade flow. Atherosclerotic plaque extends into the proximal ECA with estimated 50 percent narrowing at the origin. Right internal carotid artery: Advanced atherosclerotic changes at the right carotid bifurcation, with at least 50 percent circumferential calcified plaque in the proximal right ICA, and extension of plaque into the proximal right external carotid artery. By NASCET criteria, the initial stenosis measures at least 81 percent . After balloon angioplasty, stenosis measures less than 50 percent. Right MCA: Since the prior CT, there has been migration of the thrombus into the inferior division, with a superior division frontal branch filling. There is some contamination on the initial angiogram by the left-sided MCA branches given the significant right to left cross fill of the patent anterior communicating artery. Right ACA: A 1 segment patent. A 2 segment perfuses the right territory. Significant cross fill to the left. Completion Findings: Right MCA: TICI 3: Complete perfusion of the territory Significant cross fill persists from the right to the left.  Less than 50 percent stenosis at the cervical ICA origin. PROCEDURE: The anesthesia team was present to provide general endotracheal tube anesthesia and for patient monitoring during the procedure. Intubation was performed in negative pressure [place] in neuro IR holding. Interventional neuro radiology nursing staff was also present. Ultrasound survey of the left inguinal region was performed with images stored and sent to PACs, confirming patency of the vessel. A micropuncture needle was used access  the left common femoral artery under ultrasound. With excellent arterial blood flow returned, and an .018 micro wire was passed through the needle, observed enter the abdominal aorta under fluoroscopy. The needle was removed, and a micropuncture sheath was placed over the wire. The inner dilator and wire were removed, and an 035 Bentson wire was advanced under fluoroscopy into the abdominal aorta. The sheath was removed and a standard 4 [place] vascular sheath was placed. The dilator was removed and the sheath was flushed. Ultrasound survey of the right inguinal region was performed with images stored and sent to PACs. Patency of the right common femoral artery confirmed. 11 blade scalpel was used to make a small incision. Blunt dissection was performed with US guidance. A micropuncture needle was used access the right common femoral artery under ultrasound. With excellent arterial blood flow returned, an .018 micro wire was passed through the needle, observed to enter the abdominal aorta under fluoroscopy. The needle was removed, and a micropuncture sheath was placed over the wire. The inner dilator and wire were removed, and an 035 wire was advanced under fluoroscopy into the abdominal aorta. The sheath was removed and a 25cm 3F straight vascular sheath was placed. The dilator was removed and the sheath was flushed. Sheath was attached to pressurized and heparinized saline bag for constant forward flow. JB 1 catheter was advanced with a Glidewire to the aortic arch. The Glidewire was removed and a double flush was performed. JB 1 was then used to select the innominate artery and the right common carotid artery origin. Catheter was advanced to the distal common carotid artery and the wire was removed. Angiogram was performed. Once we assessed the significant stenosis in the proximal right ICA, we elected to proceed 1st with balloon angioplasty. Exchange length roadrunner was advanced into the external carotid artery  branches and the JB 1 was removed. A coaxial system was then advanced over the 035 wire. This included a 95cm 087 "Walrus" balloon guide with coaxial 100cm JB 1 diagnostic catheter. This was advanced to the distal common carotid artery. Wire and catheter were then removed. Double flush of the balloon guide was performed. Angiogram was performed for roadmap technique. Using the roadmap technique, A synchro soft wire and rapid transit catheter were used to cross the irregular stenotic plaque of the right ICA. Catheter wire were advanced into the distal cervical ICA and the wire was removed. A floppy transcend exchange length wire was then placed through the catheter and the catheter was removed. We then proceeded with balloon angioplasty of the cervical ICA segment with a 5 mm x 30 mm Viatrac rapid exchange system. Ten atmospheric pressure was observed. As the balloon was slowly deflated, the balloon guide was advanced over the balloon and wire combination to the distal cervical segment. The balloon and wire were then removed from the system and double flush was performed at the balloon guide. Formal angiogram was performed. Road map function was used once the occluded vessel was identified. Copious back flush was performed and the balloon catheter was  attached to heparinized and pressurized saline bag for forward flow. A second coaxial system was then advanced through the balloon catheter, which included the selected intermediate catheter, microcatheter, and microwire. In this scenario, the set up included a 132cm CAT-5 intermediate catheter, a Trevo Provue18 microcatheter, and 014 synchro soft wire. This system was advanced through the balloon guide catheter under the road-map function, with adequate back-flush at the rotating hemostatic valve at that back end of the balloon guide. Microcatheter and the intermediate catheter system were advanced through the terminal ICA and MCA to the level of the occlusion. The  micro wire was then carefully advanced through the occluded segment. Microcatheter was then manipulated through the occluded segment and the wire was removed with saline drip at the hub. Intermediate catheter was advanced into the proximal M1 segment. Blood was then aspirated through the hub of the microcatheter, and a gentle contrast injection was performed confirming intraluminal position. A rotating hemostatic valve was then attached to the back end of the microcatheter, and a pressurized and heparinized saline bag was attached to the catheter. 4 x 40 solitaire device was then selected. Back flush was achieved at the rotating hemostatic valve, and then the device was gently advanced through the microcatheter to the distal end. The retriever was then unsheathed by withdrawing the microcatheter under fluoroscopy. Once the retriever was completely unsheathed, the microcatheter was carefully stripped from the delivery device. Control angiogram was performed from the intermediate catheter. A 3 minute time interval was observed. The intermediate catheter was then attached to the proprietary suction engine and the catheter was slowly advanced over the solitaire until we observed stasis of flow within the intermediate catheter. The balloon at the balloon guide catheter was then inflated under fluoroscopy for proximal flow arrest. Constant aspiration using the proprietary engine was then performed at the intermediate catheter, as the retriever was gently and slowly withdrawn with fluoroscopic observation. Once the retriever was "corked" within the tip of the intermediate catheter, both were removed from the system. Free aspiration was confirmed at the hub of the balloon guide catheter, with free blood return confirmed. The balloon was then deflated, and a control angiogram was performed. Restoration of flow was confirmed. Balloon guide was withdrawn to the common carotid artery segment after placing the transcend wire to  the distal cervical segment, to maintain wire cross the ICA origin. Angiogram of the cervical ICA was performed. Given the improved diameter on the conclusion, we elected not to deployed stent at this time, as that would require dual anti-platelet therapy. Balloon guide and wire were then removed. The skin at the puncture site was then cleaned with Chlorhexidine. The 8 French sheath was removed and an 42F angioseal was deployed. Flat panel CT was performed. Patient tolerated the procedure well and remained hemodynamically stable throughout. No complications were encountered and no significant blood loss encountered. IMPRESSION: Status post ultrasound guided access right common femoral artery for right-sided cervical and cerebral angiogram, balloon angioplasty of high-grade right ICA stenosis and mechanical thrombectomy of right M1 occlusion, achieving TICI 3 flow. Angio-Seal for hemostasis. Status post ultrasound-guided access left common femoral artery for placement of arterial monitoring line for anesthesia/critical care. Signed, Dulcy Fanny. Dellia Nims, RPVI [facility] Radiology PLAN: The patient will remain intubated, given the critical care status ICU status Target systolic blood pressure of 120-140 Right hip straight time 6 hours Left common femoral artery monitoring line. Frequent neurovascular checks Repeat neurologic imaging with CT and/MRI at  the discretion of neurology team Electronically Signed   By: Corrie Mckusick D.O.   On: 11/04/2020 08:46   IR US Guide Vasc Access Left  Result Date: 11/04/2020 INDICATION: 66 year old male presents with acute right M1 occlusion, emergent large vessel occlusion, for mechanical thrombectomy EXAM: ULTRASOUND-GUIDED ACCESS RIGHT COMMON FEMORAL ARTERY ULTRASOUND-GUIDED ACCESS LEFT COMMON FEMORAL ARTERY RIGHT CERVICAL AND CEREBRAL ANGIOGRAM BALLOON ANGIOPLASTY CRITICAL STENOSIS RIGHT ICA RIGHT M1 MECHANICAL THROMBECTOMY  ANGIO-SEAL FOR HEMOSTASIS. COMPARISON:  CT IMAGING SAME DAY MEDICATIONS: Preoperative T NK ANESTHESIA/SEDATION: The anesthesia team was present to provide general endotracheal tube anesthesia and for patient monitoring during the procedure. Intubation was performed in biplane/neuro interventional suite. Left radial arterial line was performed by the anesthesia team. Interventional neuro radiology nursing staff was also present. CONTRAST:  70 cc omni 350 FLUOROSCOPY TIME:  Fluoroscopy Time: 17 minutes 48 seconds (1029 mGy). COMPLICATIONS: None TECHNIQUE: Informed written consent was obtained from the patient's family after a thorough discussion of the procedural risks, benefits and alternatives. Specific risks discussed include: Bleeding, infection, contrast reaction, kidney injury/failure, need for further procedure/surgery, arterial injury or dissection, embolization to new territory, intracranial hemorrhage (10-15% risk), neurologic deterioration, cardiopulmonary collapse, death. All questions were addressed. Maximal Sterile Barrier Technique was utilized including during the procedure including caps, mask, sterile gowns, sterile gloves, sterile drape, hand hygiene and skin antiseptic. A timeout was performed prior to the initiation of the procedure. The anesthesia team was present to provide general endotracheal tube anesthesia and for patient monitoring during the procedure. Interventional neuro radiology nursing staff was also present. FINDINGS: Initial Findings: Right common carotid artery:  Normal course caliber and contour. Right external carotid artery: Patent with antegrade flow. Atherosclerotic plaque extends into the proximal ECA with estimated 50 percent narrowing at the origin. Right internal carotid artery: Advanced atherosclerotic changes at the right carotid bifurcation, with at least 50 percent circumferential calcified plaque in the proximal right ICA, and extension of plaque into the proximal  right external carotid artery. By NASCET criteria, the initial stenosis measures at least 81 percent . After balloon angioplasty, stenosis measures less than 50 percent. Right MCA: Since the prior CT, there has been migration of the thrombus into the inferior division, with a superior division frontal branch filling. There is some contamination on the initial angiogram by the left-sided MCA branches given the significant right to left cross fill of the patent anterior communicating artery. Right ACA: A 1 segment patent. A 2 segment perfuses the right territory. Significant cross fill to the left. Completion Findings: Right MCA: TICI 3: Complete perfusion of the territory Significant cross fill persists from the right to the left. Less than 50 percent stenosis at the cervical ICA origin. PROCEDURE: The anesthesia team was present to provide general endotracheal tube anesthesia and for patient monitoring during the procedure. Intubation was performed in negative pressure [place] in neuro IR holding. Interventional neuro radiology nursing staff was also present. Ultrasound survey of the left inguinal region was performed with images stored and sent to PACs, confirming patency of the vessel. A micropuncture needle was used access the left common femoral artery under ultrasound. With excellent arterial blood flow returned, and an .018 micro wire was passed through the needle, observed enter the abdominal aorta under fluoroscopy. The needle was removed, and a micropuncture sheath was placed over the wire. The inner dilator and wire were removed, and an 035 Bentson wire was advanced under fluoroscopy into the abdominal aorta. The sheath was removed and a standard 4  [place] vascular sheath was placed. The dilator was removed and the sheath was flushed. Ultrasound survey of the right inguinal region was performed with images stored and sent to PACs. Patency of the right common femoral artery confirmed. 11 blade scalpel was used  to make a small incision. Blunt dissection was performed with US guidance. A micropuncture needle was used access the right common femoral artery under ultrasound. With excellent arterial blood flow returned, an .018 micro wire was passed through the needle, observed to enter the abdominal aorta under fluoroscopy. The needle was removed, and a micropuncture sheath was placed over the wire. The inner dilator and wire were removed, and an 035 wire was advanced under fluoroscopy into the abdominal aorta. The sheath was removed and a 25cm 33F straight vascular sheath was placed. The dilator was removed and the sheath was flushed. Sheath was attached to pressurized and heparinized saline bag for constant forward flow. JB 1 catheter was advanced with a Glidewire to the aortic arch. The Glidewire was removed and a double flush was performed. JB 1 was then used to select the innominate artery and the right common carotid artery origin. Catheter was advanced to the distal common carotid artery and the wire was removed. Angiogram was performed. Once we assessed the significant stenosis in the proximal right ICA, we elected to proceed 1st with balloon angioplasty. Exchange length roadrunner was advanced into the external carotid artery branches and the JB 1 was removed. A coaxial system was then advanced over the 035 wire. This included a 95cm 087 "[place]" balloon guide with coaxial 100cm JB 1 diagnostic catheter. This was advanced to the distal common carotid artery. Wire and catheter were then removed. Double flush of the balloon guide was performed. Angiogram was performed for roadmap technique. Using the roadmap technique, A synchro soft wire and rapid transit catheter were used to cross the irregular stenotic plaque of the right ICA. Catheter wire were advanced into the distal cervical ICA and the wire was removed. A floppy transcend exchange length wire was then placed through the catheter and the catheter was removed. We  then proceeded with balloon angioplasty of the cervical ICA segment with a 5 mm x 30 mm Viatrac rapid exchange system. Ten atmospheric pressure was observed. As the balloon was slowly deflated, the balloon guide was advanced over the balloon and wire combination to the distal cervical segment. The balloon and wire were then removed from the system and double flush was performed at the balloon guide. Formal angiogram was performed. Road map function was used once the occluded vessel was identified. Copious back flush was performed and the balloon catheter was attached to heparinized and pressurized saline bag for forward flow. A second coaxial system was then advanced through the balloon catheter, which included the selected intermediate catheter, microcatheter, and microwire. In this scenario, the set up included a 132cm CAT-5 intermediate catheter, a Trevo Provue18 microcatheter, and 014 synchro soft wire. This system was advanced through the balloon guide catheter under the road-map function, with adequate back-flush at the rotating hemostatic valve at that back end of the balloon guide. Microcatheter and the intermediate catheter system were advanced through the terminal ICA and MCA to the level of the occlusion. The micro wire was then carefully advanced through the occluded segment. Microcatheter was then manipulated through the occluded segment and the wire was removed with saline drip at the hub. Intermediate catheter was advanced into the proximal M1 segment. Blood was then aspirated through the hub  of the microcatheter, and a gentle contrast injection was performed confirming intraluminal position. A rotating hemostatic valve was then attached to the back end of the microcatheter, and a pressurized and heparinized saline bag was attached to the catheter. 4 x 40 solitaire device was then selected. Back flush was achieved at the rotating hemostatic valve, and then the device was gently advanced through the  microcatheter to the distal end. The retriever was then unsheathed by withdrawing the microcatheter under fluoroscopy. Once the retriever was completely unsheathed, the microcatheter was carefully stripped from the delivery device. Control angiogram was performed from the intermediate catheter. A 3 minute time interval was observed. The intermediate catheter was then attached to the proprietary suction engine and the catheter was slowly advanced over the solitaire until we observed stasis of flow within the intermediate catheter. The balloon at the balloon guide catheter was then inflated under fluoroscopy for proximal flow arrest. Constant aspiration using the proprietary engine was then performed at the intermediate catheter, as the retriever was gently and slowly withdrawn with fluoroscopic observation. Once the retriever was "corked" within the tip of the intermediate catheter, both were removed from the system. Free aspiration was confirmed at the hub of the balloon guide catheter, with free blood return confirmed. The balloon was then deflated, and a control angiogram was performed. Restoration of flow was confirmed. Balloon guide was withdrawn to the common carotid artery segment after placing the transcend wire to the distal cervical segment, to maintain wire cross the ICA origin. Angiogram of the cervical ICA was performed. Given the improved diameter on the conclusion, we elected not to deployed stent at this time, as that would require dual anti-platelet therapy. Balloon guide and wire were then removed. The skin at the puncture site was then cleaned with Chlorhexidine. The 8 French sheath was removed and an 14F angioseal was deployed. Flat panel CT was performed. Patient tolerated the procedure well and remained hemodynamically stable throughout. No complications were encountered and no significant blood loss encountered. IMPRESSION: Status post ultrasound guided access right common femoral artery for  right-sided cervical and cerebral angiogram, balloon angioplasty of high-grade right ICA stenosis and mechanical thrombectomy of right M1 occlusion, achieving TICI 3 flow. Angio-Seal for hemostasis. Status post ultrasound-guided access left common femoral artery for placement of arterial monitoring line for anesthesia/critical care. Signed, Dulcy Fanny. Dellia Nims, RPVI V[facility] Interventional Radiology Specialists [facility] PLAN: The patient will remain intubated, given the critical care status ICU status Target systolic blood pressure of 120-140 Right hip straight time 6 hours Left common femoral artery monitoring line. Frequent neurovascular checks Repeat neurologic imaging with CT and/MRI at the discretion of neurology team Electronically Signed   By: Corrie Mckusick D.O.   On: 11/04/2020 08:46   IR US Guide Vasc Access Right  Result Date: 11/06/2020 INDICATION: 64 year old male with a history of symptomatic high-grade right carotid stenosis, high risk for surgery given cardiac issues EXAM: ULTRASOUND-GUIDED ACCESS RIGHT COMMON FEMORAL ARTERY RIGHT CAROTID STENT WITH EMBOLIC PROTECTION MEDICATIONS: 8000 UNITS IV HEPARIN. 300 mg p.o. Plavix ANESTHESIA/SEDATION: The anesthesia team was present to provide MAC anesthesia and for patient monitoring during the procedure. Left radial arterial line was performed by the anesthesia team. Interventional neuro radiology nursing staff was also present. FLUOROSCOPY TIME:  Fluoroscopy Time: 6 minutes 42 seconds (491 mGy). COMPLICATIONS: None TECHNIQUE: Informed written consent was obtained from the patient and the patient's family after a thorough discussion of the procedural risks, benefits and alternatives. Specific risks  discussed include: Bleeding, infection, contrast reaction, kidney injury/failure, need for further procedure/surgery, arterial injury or dissection, embolization to new territory, revascularization syndrome, neurologic deterioration,  cardiopulmonary collapse, death. All questions were addressed. Maximal Sterile Barrier Technique was utilized including during the procedure including caps, mask, sterile gowns, sterile gloves, sterile drape, hand hygiene and skin antiseptic. A timeout was performed prior to the initiation of the procedure. The anesthesia team was present to provide general endotracheal tube anesthesia and for patient monitoring during the procedure. Interventional neuro radiology nursing staff was also present. PROCEDURE: After the anesthesia team initiated general anesthesia, a time-out was performed. The patient is prepped and draped in the usual sterile fashion, room 2 in the neuro IR suite. Ultrasound survey of the right inguinal region was performed with images stored and sent to PACs. Ultrasound demonstrates patent right common femoral artery. 11 blade scalpel was used to make a small incision. Blunt dissection was performed with ultrasound guidance. A micropuncture needle was used access the right common femoral artery under ultrasound. With excellent arterial blood flow returned, an .018 micro wire was passed through the needle, observed to enter the abdominal aorta under fluoroscopy. The needle was removed, and a micropuncture sheath was placed over the wire. The inner dilator and wire were removed, and an 035 Bentson wire was advanced under fluoroscopy into the abdominal aorta. The sheath was removed and a standard 5 [place] vascular sheath was placed. The dilator was removed and the sheath was flushed. A 85F JB-1 diagnostic catheter was advanced over the wire to the proximal descending thoracic aorta. Wire was then removed. Double flush of the catheter was performed. We then attempted four-vessel cerebral angiogram. Given that the patient had a sub-optimal response to MAC anesthesia, and his heart precluded general anesthesia, we elected to abort a complete 4 vessel angiogram. Angiogram of the left cervical carotid system  was performed to document status of the right ICA at the bifurcation. Images were reviewed. We then proceeded with our treatment plan. 8000 units of IV heparin was administered. The JB 1 catheter was used to select the innominate artery, directed then into the right common carotid artery. With an adequate catheter position in the common carotid artery, roadmap angiogram was performed. The catheter was then navigated with the assistance of a Glidewire into the external carotid artery branches. Wire was removed and a exchange length wire was placed. Catheter was removed on the Rosen wire. The 5 French sheath was then removed and exchanged for 8 French 55 centimeter BrightTip sheath. Sheath was flushed and attached to pressurized and heparinized saline bag for constant forward flow. Then an 8 [place], 95 cm wall wrist balloon tip catheter was prepared on the back table with inflation of the balloon with 50/50 concentration of dilute contrast. The balloon catheter was then advanced over the wire, positioned into the distal right common carotid artery. Copious back flush was performed and the balloon catheter was attached to heparinized and pressurized saline bag for forward flow. Angiogram was then performed. After review of the images and measurements, we elected to proceed with distal embolic protection using Nav 6 device, 4 - 19m size. We selected an 9-7 x 40 mm Xact stent, with a plan for pre-dilation 5 mm x 20 mm balloon angioplasty with a Viatrac balloon. These devices were prepped on the back table, including the retrieval device of the Nav 6. Confirmation angiogram was performed for road map guide. The bare wire on the NAV6 was advanced through the stenotic  region of the ICA, into a distal position within the cervical ICA. The Nav-6 device was then advanced into a safe location above the carotid lesion with deployment of the Nav 6. Timer was started, and a gentle infusion of contrast confirmed forward flow. The  deployment device was removed on the bare wire and was disposed. 5 mm balloon angioplasty was then performed. 10 atmospheres was observed. The patient remained hemodynamically stable throughout this maneuver. Balloon was removed and disposed. The selected stent was then deployed at the lesion with the deployment device removed from the bare wire and disposed. Angiogram was performed. The retrieval device was then passed over the bare wire and the Nav 6 embolic protection device was retrieved. The entire unit was removed from the balloon guide catheter under fluoroscopic observation as this traversed the new stent. Less than 9 minutes of embolic protection open time. Angiogram was performed. We then observed 5 minutes interval and a final angiogram was performed. The 55 centimeter 8 French sheath was removed and 7 [place] Celt was deployed. Patient tolerated the procedure well and remained hemodynamically stable throughout. No complications were encountered and no significant blood loss encountered. FINDINGS: Initial Findings: Left common carotid artery: Normal course caliber and contour with independent origin from the aortic arch. Left external carotid artery: Patent with antegrade flow with moderate atherosclerosis at the origin. Left internal carotid artery: Circumferential calcifications with high-grade stenosis at the origin corresponding to the duplex exam that was recently performed of 80%-99% stenosis, on the high end. Slow flow through the distal cervical segment of the ICA. Diagnostic images of the distal cervical segment and of the intracranial portion were not capable given the patient's suboptimal response to the anesthesia and motion artifact. Right common carotid artery:  Normal course caliber and contour. Right external carotid artery: Patent with antegrade flow. Right internal carotid artery: Circumferential calcific plaque at the proximal right ICA, with irregular margins and greater than 50%  residual stenosis after the previous treatment at the time of the stroke treatment. Completion: After angioplasty and stenting there is approximately 30% residual stenosis. Right MCA: M1 segment patent. Insular and opercular segments patent. Unremarkable caliber and course of the cortical segments. Typical arterial, capillary/ parenchymal, and venous phase. Right ACA: A 1 segment patent. A 2 segment perfuses the right territory. Patent anterior communicating artery rapidly fills the left hemispheric MCA branches IMPRESSION: Status post ultrasound guided access right common femoral artery for right cervical ICA stenting with embolic protection, for treatment of high-grade symptomatic stenosis, high risk for surgery. Angiogram demonstrates a left cervical ICA critical stenosis. Signed, Dulcy Fanny. Dellia Nims, RPVI Vascular and Interventional Radiology Specialists [facility] Electronically Signed   By: Corrie Mckusick D.O.   On: 11/06/2020 16:15   IR US Guide Vasc Access Right  Result Date: 11/04/2020 INDICATION: 65 year old male presents with acute right M1 occlusion, emergent large vessel occlusion, for mechanical thrombectomy EXAM: ULTRASOUND-GUIDED ACCESS RIGHT COMMON FEMORAL ARTERY ULTRASOUND-GUIDED ACCESS LEFT COMMON FEMORAL ARTERY RIGHT CERVICAL AND CEREBRAL ANGIOGRAM BALLOON ANGIOPLASTY CRITICAL STENOSIS RIGHT ICA RIGHT M1 MECHANICAL THROMBECTOMY ANGIO-SEAL FOR HEMOSTASIS. COMPARISON:  CT IMAGING SAME DAY MEDICATIONS: Preoperative T NK ANESTHESIA/SEDATION: The anesthesia team was present to provide general endotracheal tube anesthesia and for patient monitoring during the procedure. Intubation was performed in biplane/neuro interventional suite. Left radial arterial line was performed by the anesthesia team. Interventional neuro radiology nursing staff was also present. CONTRAST:  70 cc omni 350 FLUOROSCOPY TIME:  Fluoroscopy Time: 17 minutes 48 seconds (1029  mGy). COMPLICATIONS: None TECHNIQUE:  Informed written consent was obtained from the patient's family after a thorough discussion of the procedural risks, benefits and alternatives. Specific risks discussed include: Bleeding, infection, contrast reaction, kidney injury/failure, need for further procedure/surgery, arterial injury or dissection, embolization to new territory, intracranial hemorrhage (10-15% risk), neurologic deterioration, cardiopulmonary collapse, death. All questions were addressed. Maximal Sterile Barrier Technique was utilized including during the procedure including caps, mask, sterile gowns, sterile gloves, sterile drape, hand hygiene and skin antiseptic. A timeout was performed prior to the initiation of the procedure. The anesthesia team was present to provide general endotracheal tube anesthesia and for patient monitoring during the procedure. Interventional neuro radiology nursing staff was also present. FINDINGS: Initial Findings: Right common carotid artery:  Normal course caliber and contour. Right external carotid artery: Patent with antegrade flow. Atherosclerotic plaque extends into the proximal ECA with estimated 50 percent narrowing at the origin. Right internal carotid artery: Advanced atherosclerotic changes at the right carotid bifurcation, with at least 50 percent circumferential calcified plaque in the proximal right ICA, and extension of plaque into the proximal right external carotid artery. By NASCET criteria, the initial stenosis measures at least 81 percent . After balloon angioplasty, stenosis measures less than 50 percent. Right MCA: Since the prior CT, there has been migration of the thrombus into the inferior division, with a superior division frontal branch filling. There is some contamination on the initial angiogram by the left-sided MCA branches given the significant right to left cross fill of the patent anterior communicating artery. Right ACA: A 1 segment patent. A 2 segment perfuses the right  territory. Significant cross fill to the left. Completion Findings: Right MCA: TICI 3: Complete perfusion of the territory Significant cross fill persists from the right to the left. Less than 50 percent stenosis at the cervical ICA origin. PROCEDURE: The anesthesia team was present to provide general endotracheal tube anesthesia and for patient monitoring during the procedure. Intubation was performed in negative pressure [place] in neuro IR holding. Interventional neuro radiology nursing staff was also present. Ultrasound survey of the left inguinal region was performed with images stored and sent to PACs, confirming patency of the vessel. A micropuncture needle was used access the left common femoral artery under ultrasound. With excellent arterial blood flow returned, and an .018 micro wire was passed through the needle, observed enter the abdominal aorta under fluoroscopy. The needle was removed, and a micropuncture sheath was placed over the wire. The inner dilator and wire were removed, and an 035 Bentson wire was advanced under fluoroscopy into the abdominal aorta. The sheath was removed and a standard 4 [place] vascular sheath was placed. The dilator was removed and the sheath was flushed. Ultrasound survey of the right inguinal region was performed with images stored and sent to PACs. Patency of the right common femoral artery confirmed. 11 blade scalpel was used to make a small incision. Blunt dissection was performed with US guidance. A micropuncture needle was used access the right common femoral artery under ultrasound. With excellent arterial blood flow returned, an .018 micro wire was passed through the needle, observed to enter the abdominal aorta under fluoroscopy. The needle was removed, and a micropuncture sheath was placed over the wire. The inner dilator and wire were removed, and an 035 wire was advanced under fluoroscopy into the abdominal aorta. The sheath was removed and a 25cm 31F straight  vascular sheath was placed. The dilator was removed and the sheath was flushed. Sheath was  attached to pressurized and heparinized saline bag for constant forward flow. JB 1 catheter was advanced with a Glidewire to the aortic arch. The Glidewire was removed and a double flush was performed. JB 1 was then used to select the innominate artery and the right common carotid artery origin. Catheter was advanced to the distal common carotid artery and the wire was removed. Angiogram was performed. Once we assessed the significant stenosis in the proximal right ICA, we elected to proceed 1st with balloon angioplasty. Exchange length roadrunner was advanced into the external carotid artery branches and the JB 1 was removed. A coaxial system was then advanced over the 035 wire. This included a 95cm 087 "[place]" balloon guide with coaxial 100cm JB 1 diagnostic catheter. This was advanced to the distal common carotid artery. Wire and catheter were then removed. Double flush of the balloon guide was performed. Angiogram was performed for roadmap technique. Using the roadmap technique, A synchro soft wire and rapid transit catheter were used to cross the irregular stenotic plaque of the right ICA. Catheter wire were advanced into the distal cervical ICA and the wire was removed. A floppy transcend exchange length wire was then placed through the catheter and the catheter was removed. We then proceeded with balloon angioplasty of the cervical ICA segment with a 5 mm x 30 mm Viatrac rapid exchange system. Ten atmospheric pressure was observed. As the balloon was slowly deflated, the balloon guide was advanced over the balloon and wire combination to the distal cervical segment. The balloon and wire were then removed from the system and double flush was performed at the balloon guide. Formal angiogram was performed. Road map function was used once the occluded vessel was identified. Copious back flush was performed and the balloon  catheter was attached to heparinized and pressurized saline bag for forward flow. A second coaxial system was then advanced through the balloon catheter, which included the selected intermediate catheter, microcatheter, and microwire. In this scenario, the set up included a 132cm CAT-5 intermediate catheter, a Trevo Provue18 microcatheter, and 014 synchro soft wire. This system was advanced through the balloon guide catheter under the road-map function, with adequate back-flush at the rotating hemostatic valve at that back end of the balloon guide. Microcatheter and the intermediate catheter system were advanced through the terminal ICA and MCA to the level of the occlusion. The micro wire was then carefully advanced through the occluded segment. Microcatheter was then manipulated through the occluded segment and the wire was removed with saline drip at the hub. Intermediate catheter was advanced into the proximal M1 segment. Blood was then aspirated through the hub of the microcatheter, and a gentle contrast injection was performed confirming intraluminal position. A rotating hemostatic valve was then attached to the back end of the microcatheter, and a pressurized and heparinized saline bag was attached to the catheter. 4 x 40 solitaire device was then selected. Back flush was achieved at the rotating hemostatic valve, and then the device was gently advanced through the microcatheter to the distal end. The retriever was then unsheathed by withdrawing the microcatheter under fluoroscopy. Once the retriever was completely unsheathed, the microcatheter was carefully stripped from the delivery device. Control angiogram was performed from the intermediate catheter. A 3 minute time interval was observed. The intermediate catheter was then attached to the proprietary suction engine and the catheter was slowly advanced over the solitaire until we observed stasis of flow within the intermediate catheter. The balloon at the  balloon guide  catheter was then inflated under fluoroscopy for proximal flow arrest. Constant aspiration using the proprietary engine was then performed at the intermediate catheter, as the retriever was gently and slowly withdrawn with fluoroscopic observation. Once the retriever was "corked" within the tip of the intermediate catheter, both were removed from the system. Free aspiration was confirmed at the hub of the balloon guide catheter, with free blood return confirmed. The balloon was then deflated, and a control angiogram was performed. Restoration of flow was confirmed. Balloon guide was withdrawn to the common carotid artery segment after placing the transcend wire to the distal cervical segment, to maintain wire cross the ICA origin. Angiogram of the cervical ICA was performed. Given the improved diameter on the conclusion, we elected not to deployed stent at this time, as that would require dual anti-platelet therapy. Balloon guide and wire were then removed. The skin at the puncture site was then cleaned with Chlorhexidine. The 8 French sheath was removed and an 79F angioseal was deployed. Flat panel CT was performed. Patient tolerated the procedure well and remained hemodynamically stable throughout. No complications were encountered and no significant blood loss encountered. IMPRESSION: Status post ultrasound guided access right common femoral artery for right-sided cervical and cerebral angiogram, balloon angioplasty of high-grade right ICA stenosis and mechanical thrombectomy of right M1 occlusion, achieving TICI 3 flow. Angio-Seal for hemostasis. Status post ultrasound-guided access left common femoral artery for placement of arterial monitoring line for anesthesia/critical care. Signed, Dulcy Fanny. Dellia Nims, RPVI Vascular and Interventional Radiology Specialists [facility] Radiology PLAN: The patient will remain intubated, given the critical care status ICU status Target systolic blood pressure of  120-140 Right hip straight time 6 hours Left common femoral artery monitoring line. Frequent neurovascular checks Repeat neurologic imaging with CT and/MRI at the discretion of neurology team Electronically Signed   By: Corrie Mckusick D.O.   On: 11/04/2020 08:46   Portable Chest x-ray  Result Date: 11/03/2020 CLINICAL DATA:  Status post endotracheal tube and OG tube placement. EXAM: PORTABLE CHEST 1 VIEW COMPARISON:  Chest x-ray dated 12/02/2015. FINDINGS: Endotracheal tube appears well positioned with tip above the level of the carina. Enteric tube passes below the diaphragm. Mild central pulmonary vascular congestion. Probable mild atelectasis at the medial aspect of the RIGHT lung base. Coarse interstitial markings throughout the remainder of both lungs. No pleural effusion or pneumothorax is seen. IMPRESSION: 1. Endotracheal and enteric tubes appear well positioned. 2. Central pulmonary vascular congestion and coarse interstitial markings throughout both lungs, most likely interstitial edema superimposed on chronic interstitial lung disease. 3. Probable atelectasis at the RIGHT lung base. Electronically Signed   By: Franki Cabot M.D.   On: 11/03/2020 09:53   ECHOCARDIOGRAM COMPLETE  Result Date: 11/03/2020    ECHOCARDIOGRAM REPORT   Patient Name:   Christopher Lang Date of Exam: 11/03/2020 Medical Rec #:  [id number]       Height:       72.0 in Accession #:    [id number]      Weight:       193.1 lb Date of Birth:  06/30/1955       BSA:          2.099 m Patient Age:    1 years        BP:           141/86 mmHg Patient Gender: M               HR:  74 bpm. Exam Location:  Inpatient Procedure: 2D Echo, Cardiac Doppler, Color Doppler and Intracardiac            Opacification Agent Indications:    Acute myocardial infarction  History:        Patient has prior history of Echocardiogram examinations, most                 recent 06/07/2019. CHF, COPD, Arrythmias:Atrial Fibrillation;                 Risk  Factors:Current Smoker, Dyslipidemia, Hypertension and                 Sleep Apnea.  Sonographer:    Clayton Lefort RDCS (AE) Referring Phys: [id number] [place]  1. The mid to distal septum/anterior segments are akinetic. The entire apex is akinetic. The myocardium appears thin. Suspect these findings represent a large wrap around LAD infarction (troponin >24,000). No LV thrombus on contrast imaging. There is severe LV dysfunction, EF 20-25%. Left ventricular ejection fraction, by estimation, is 20 to 25%. The left ventricle has severely decreased function. The left ventricle demonstrates regional wall motion abnormalities (see scoring diagram/findings for description). There is mild left ventricular hypertrophy. Left ventricular diastolic parameters are consistent with Grade I diastolic dysfunction (impaired relaxation).  2. Right ventricular systolic function is normal. The right ventricular size is normal. Tricuspid regurgitation signal is inadequate for assessing PA pressure.  3. The mitral valve is grossly normal. Trivial mitral valve regurgitation. No evidence of mitral stenosis.  4. The aortic valve is tricuspid. There is moderate calcification of the aortic valve. Aortic valve regurgitation is not visualized. Mild to moderate aortic valve sclerosis/calcification is present, without any evidence of aortic stenosis.  5. The inferior vena cava is normal in size with <50% respiratory variability, suggesting right atrial pressure of 8 mmHg. Comparison(s): Changes from prior study are noted. LVEF now severely reduced 20-25%. WMA suggest LAD infarction. Conclusion(s)/Recommendation(s): Findings consistent with ischemic cardiomyopathy. FINDINGS  Left Ventricle: The mid to distal septum/anterior segments are akinetic. The entire apex is akinetic. The myocardium appears thin. Suspect these findings represent a large wrap around LAD infarction (troponin >24,000). No LV thrombus on contrast imaging. There  is severe LV dysfunction, EF 20-25%. Left ventricular ejection fraction, by estimation, is 20 to 25%. The left ventricle has severely decreased function. The left ventricle demonstrates regional wall motion abnormalities. Definity contrast  agent was given IV to delineate the left ventricular endocardial borders. The left ventricular internal cavity size was normal in size. There is mild left ventricular hypertrophy. Left ventricular diastolic parameters are consistent with Grade I diastolic dysfunction (impaired relaxation).  LV Wall Scoring: The mid and distal anterior wall, mid and distal anterior septum, entire apex, and mid inferoseptal segment are akinetic. Right Ventricle: The right ventricular size is normal. No increase in right ventricular wall thickness. Right ventricular systolic function is normal. Tricuspid regurgitation signal is inadequate for assessing PA pressure. Left Atrium: Left atrial size was normal in size. Right Atrium: Right atrial size was normal in size. Pericardium: There is no evidence of pericardial effusion. Mitral Valve: The mitral valve is grossly normal. Mild mitral annular calcification. Trivial mitral valve regurgitation. No evidence of mitral valve stenosis. MV peak gradient, 5.6 mmHg. The mean mitral valve gradient is 3.0 mmHg. Tricuspid Valve: The tricuspid valve is grossly normal. Tricuspid valve regurgitation is trivial. No evidence of tricuspid stenosis. Aortic Valve: The aortic valve is tricuspid. There is moderate calcification of  the aortic valve. Aortic valve regurgitation is not visualized. Mild to moderate aortic valve sclerosis/calcification is present, without any evidence of aortic stenosis. Aortic valve mean gradient measures 4.0 mmHg. Aortic valve peak gradient measures 7.6 mmHg. Aortic valve area, by VTI measures 1.80 cm. Pulmonic Valve: The pulmonic valve was grossly normal. Pulmonic valve regurgitation is trivial. No evidence of pulmonic stenosis. Aorta: The  aortic root is normal in size and structure. Venous: The inferior vena cava is normal in size with less than 50% respiratory variability, suggesting right atrial pressure of 8 mmHg. IAS/Shunts: The atrial septum is grossly normal.  LEFT VENTRICLE PLAX 2D LVIDd:         4.40 cm   Diastology LVIDs:         3.10 cm   LV e' medial:    6.53 cm/s LV PW:         1.20 cm   LV E/e' medial:  12.5 LV IVS:        1.20 cm   LV e' lateral:   6.75 cm/s LVOT diam:     2.00 cm   LV E/e' lateral: 12.1 LV SV:         54 LV SV Index:   26 LVOT Area:     3.14 cm                           3D Volume EF:                          3D EF:        42 %                          LV EDV:       154 ml                          LV ESV:       89 ml                          LV SV:        64 ml RIGHT VENTRICLE             IVC RV Basal diam:  2.90 cm     IVC diam: 1.90 cm RV S prime:     16.10 cm/s TAPSE (M-mode): 2.3 cm LEFT ATRIUM           Index        RIGHT ATRIUM           Index LA diam:      3.30 cm 1.57 cm/m   RA Area:     19.20 cm LA Vol (A4C): 45.0 ml 21.44 ml/m  RA Volume:   56.10 ml  26.73 ml/m  AORTIC VALVE AV Area (Vmax):    2.05 cm AV Area (Vmean):   1.83 cm AV Area (VTI):     1.80 cm AV Vmax:           138.00 cm/s AV Vmean:          98.600 cm/s AV VTI:            0.298 m AV Peak Grad:      7.6 mmHg AV Mean Grad:      4.0 mmHg LVOT Vmax:  90.10 cm/s LVOT Vmean:        57.500 cm/s LVOT VTI:          0.171 m LVOT/AV VTI ratio: 0.57  AORTA Ao Root diam: 3.50 cm Ao Asc diam:  2.90 cm MITRAL VALVE MV Area (PHT): 3.66 cm     SHUNTS MV Area VTI:   1.70 cm     Systemic VTI:  0.17 m MV Peak grad:  5.6 mmHg     Systemic Diam: 2.00 cm MV Mean grad:  3.0 mmHg MV Vmax:       1.18 m/s MV Vmean:      75.5 cm/s MV Decel Time: 207 msec MV E velocity: 81.50 cm/s MV A velocity: 128.00 cm/s MV E/A ratio:  0.64 Eleonore Chiquito MD Electronically signed by Eleonore Chiquito MD Signature Date/Time: 11/03/2020/4:49:02 PM    Final    IR PERCUTANEOUS ART  THROMBECTOMY/INFUSION INTRACRANIAL INC DIAG ANGIO  Result Date: 11/04/2020 INDICATION: 65 year old male presents with acute right M1 occlusion, emergent large vessel occlusion, for mechanical thrombectomy EXAM: ULTRASOUND-GUIDED ACCESS RIGHT COMMON FEMORAL ARTERY ULTRASOUND-GUIDED ACCESS LEFT COMMON FEMORAL ARTERY RIGHT CERVICAL AND CEREBRAL ANGIOGRAM BALLOON ANGIOPLASTY CRITICAL STENOSIS RIGHT ICA RIGHT M1 MECHANICAL THROMBECTOMY ANGIO-SEAL FOR HEMOSTASIS. COMPARISON:  CT IMAGING SAME DAY MEDICATIONS: Preoperative T NK ANESTHESIA/SEDATION: The anesthesia team was present to provide general endotracheal tube anesthesia and for patient monitoring during the procedure. Intubation was performed in biplane/neuro interventional suite. Left radial arterial line was performed by the anesthesia team. Interventional neuro radiology nursing staff was also present. CONTRAST:  70 cc omni 350 FLUOROSCOPY TIME:  Fluoroscopy Time: 17 minutes 48 seconds (1029 mGy). COMPLICATIONS: None TECHNIQUE: Informed written consent was obtained from the patient's family after a thorough discussion of the procedural risks, benefits and alternatives. Specific risks discussed include: Bleeding, infection, contrast reaction, kidney injury/failure, need for further procedure/surgery, arterial injury or dissection, embolization to new territory, intracranial hemorrhage (10-15% risk), neurologic deterioration, cardiopulmonary collapse, death. All questions were addressed. Maximal Sterile Barrier Technique was utilized including during the procedure including caps, mask, sterile gowns, sterile gloves, sterile drape, hand hygiene and skin antiseptic. A timeout was performed prior to the initiation of the procedure. The anesthesia team was present to provide general endotracheal tube anesthesia and for patient monitoring during the procedure. Interventional neuro radiology nursing staff was also present. FINDINGS: Initial Findings: Right common  carotid artery:  Normal course caliber and contour. Right external carotid artery: Patent with antegrade flow. Atherosclerotic plaque extends into the proximal ECA with estimated 50 percent narrowing at the origin. Right internal carotid artery: Advanced atherosclerotic changes at the right carotid bifurcation, with at least 50 percent circumferential calcified plaque in the proximal right ICA, and extension of plaque into the proximal right external carotid artery. By NASCET criteria, the initial stenosis measures at least 81 percent . After balloon angioplasty, stenosis measures less than 50 percent. Right MCA: Since the prior CT, there has been migration of the thrombus into the inferior division, with a superior division frontal branch filling. There is some contamination on the initial angiogram by the left-sided MCA branches given the significant right to left cross fill of the patent anterior communicating artery. Right ACA: A 1 segment patent. A 2 segment perfuses the right territory. Significant cross fill to the left. Completion Findings: Right MCA: TICI 3: Complete perfusion of the territory Significant cross fill persists from the right to the left. Less than 50 percent stenosis at the cervical ICA origin. PROCEDURE: The anesthesia team was  present to provide general endotracheal tube anesthesia and for patient monitoring during the procedure. Intubation was performed in negative pressure [place] in neuro IR holding. Interventional neuro radiology nursing staff was also present. Ultrasound survey of the left inguinal region was performed with images stored and sent to PACs, confirming patency of the vessel. A micropuncture needle was used access the left common femoral artery under ultrasound. With excellent arterial blood flow returned, and an .018 micro wire was passed through the needle, observed enter the abdominal aorta under fluoroscopy. The needle was removed, and a micropuncture sheath was placed  over the wire. The inner dilator and wire were removed, and an 035 Bentson wire was advanced under fluoroscopy into the abdominal aorta. The sheath was removed and a standard 4 [place] vascular sheath was placed. The dilator was removed and the sheath was flushed. Ultrasound survey of the right inguinal region was performed with images stored and sent to PACs. Patency of the right common femoral artery confirmed. 11 blade scalpel was used to make a small incision. Blunt dissection was performed with US guidance. A micropuncture needle was used access the right common femoral artery under ultrasound. With excellent arterial blood flow returned, an .018 micro wire was passed through the needle, observed to enter the abdominal aorta under fluoroscopy. The needle was removed, and a micropuncture sheath was placed over the wire. The inner dilator and wire were removed, and an 035 wire was advanced under fluoroscopy into the abdominal aorta. The sheath was removed and a 25cm 20F straight vascular sheath was placed. The dilator was removed and the sheath was flushed. Sheath was attached to pressurized and heparinized saline bag for constant forward flow. JB 1 catheter was advanced with a Glidewire to the aortic arch. The Glidewire was removed and a double flush was performed. JB 1 was then used to select the innominate artery and the right common carotid artery origin. Catheter was advanced to the distal common carotid artery and the wire was removed. Angiogram was performed. Once we assessed the significant stenosis in the proximal right ICA, we elected to proceed 1st with balloon angioplasty. Exchange length roadrunner was advanced into the external carotid artery branches and the JB 1 was removed. A coaxial system was then advanced over the 035 wire. This included a 95cm 087 "Walrus" balloon guide with coaxial 100cm JB 1 diagnostic catheter. This was advanced to the distal common carotid artery. Wire and catheter were  then removed. Double flush of the balloon guide was performed. Angiogram was performed for roadmap technique. Using the roadmap technique, A synchro soft wire and rapid transit catheter were used to cross the irregular stenotic plaque of the right ICA. Catheter wire were advanced into the distal cervical ICA and the wire was removed. A floppy transcend exchange length wire was then placed through the catheter and the catheter was removed. We then proceeded with balloon angioplasty of the cervical ICA segment with a 5 mm x 30 mm Viatrac rapid exchange system. Ten atmospheric pressure was observed. As the balloon was slowly deflated, the balloon guide was advanced over the balloon and wire combination to the distal cervical segment. The balloon and wire were then removed from the system and double flush was performed at the balloon guide. Formal angiogram was performed. Road map function was used once the occluded vessel was identified. Copious back flush was performed and the balloon catheter was attached to heparinized and pressurized saline bag for forward flow. A second coaxial system was  then advanced through the balloon catheter, which included the selected intermediate catheter, microcatheter, and microwire. In this scenario, the set up included a 132cm CAT-5 intermediate catheter, a Trevo Provue18 microcatheter, and 014 synchro soft wire. This system was advanced through the balloon guide catheter under the road-map function, with adequate back-flush at the rotating hemostatic valve at that back end of the balloon guide. Microcatheter and the intermediate catheter system were advanced through the terminal ICA and MCA to the level of the occlusion. The micro wire was then carefully advanced through the occluded segment. Microcatheter was then manipulated through the occluded segment and the wire was removed with saline drip at the hub. Intermediate catheter was advanced into the proximal M1 segment. Blood was  then aspirated through the hub of the microcatheter, and a gentle contrast injection was performed confirming intraluminal position. A rotating hemostatic valve was then attached to the back end of the microcatheter, and a pressurized and heparinized saline bag was attached to the catheter. 4 x 40 solitaire device was then selected. Back flush was achieved at the rotating hemostatic valve, and then the device was gently advanced through the microcatheter to the distal end. The retriever was then unsheathed by withdrawing the microcatheter under fluoroscopy. Once the retriever was completely unsheathed, the microcatheter was carefully stripped from the delivery device. Control angiogram was performed from the intermediate catheter. A 3 minute time interval was observed. The intermediate catheter was then attached to the proprietary suction engine and the catheter was slowly advanced over the solitaire until we observed stasis of flow within the intermediate catheter. The balloon at the balloon guide catheter was then inflated under fluoroscopy for proximal flow arrest. Constant aspiration using the proprietary engine was then performed at the intermediate catheter, as the retriever was gently and slowly withdrawn with fluoroscopic observation. Once the retriever was "corked" within the tip of the intermediate catheter, both were removed from the system. Free aspiration was confirmed at the hub of the balloon guide catheter, with free blood return confirmed. The balloon was then deflated, and a control angiogram was performed. Restoration of flow was confirmed. Balloon guide was withdrawn to the common carotid artery segment after placing the transcend wire to the distal cervical segment, to maintain wire cross the ICA origin. Angiogram of the cervical ICA was performed. Given the improved diameter on the conclusion, we elected not to deployed stent at this time, as that would require dual anti-platelet therapy.  Balloon guide and wire were then removed. The skin at the puncture site was then cleaned with Chlorhexidine. The 8 French sheath was removed and an 64F angioseal was deployed. Flat panel CT was performed. Patient tolerated the procedure well and remained hemodynamically stable throughout. No complications were encountered and no significant blood loss encountered. IMPRESSION: Status post ultrasound guided access right common femoral artery for right-sided cervical and cerebral angiogram, balloon angioplasty of high-grade right ICA stenosis and mechanical thrombectomy of right M1 occlusion, achieving TICI 3 flow. Angio-Seal for hemostasis. Status post ultrasound-guided access left common femoral artery for placement of arterial monitoring line for anesthesia/critical care. Signed, Dulcy Fanny. Dellia Nims, RPVI [facility] Radiology PLAN: The patient will remain intubated, given the critical care status ICU status Target systolic blood pressure of 120-140 Right hip straight time 6 hours Left common femoral artery monitoring line. Frequent neurovascular checks Repeat neurologic imaging with CT and/MRI at the discretion of neurology team Electronically Signed   By: Corrie Mckusick D.O.  On: 11/04/2020 08:46   CT HEAD CODE STROKE WO CONTRAST  Result Date: 11/03/2020 CLINICAL DATA:  Code stroke. EXAM: CT HEAD WITHOUT CONTRAST TECHNIQUE: Contiguous axial images were obtained from the base of the skull through the vertex without intravenous contrast. COMPARISON:  11/01/2013 FINDINGS: Brain: On coronal reformats there is equivocal cortical indistinctness of the right frontal operculum. No hemorrhage or hydrocephalus. Negative for mass. Patchy remote cortically based infarcts at the left vertex. Vascular: Hyperdense right M1 segment Skull: Normal. Negative for fracture or focal lesion. Sinuses/Orbits: No acute finding. Other: Page to Dr. Lorrin Goodell at 6:24 am on 11/03/2020 by  text page via the [facility] messaging system. ASPECTS [facility] Stroke Program Early CT Score) - Ganglionic level infarction (caudate, lentiform nuclei, internal capsule, insula, M1-M3 cortex): 6 or 7 - Supraganglionic infarction (M4-M6 cortex): 3 Total score (0-10 with 10 being normal): 9 or 10 IMPRESSION: 1. Dense right M1 segment suspicious for thrombosis. 2. Equivocal for acute infarct at the right insula/operculum. 3. No acute hemorrhage. 4. 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 Electronically Signed   By: Jorje Guild M.D.   On: 11/03/2020 06:27   VAS US CAROTID  Result Date: 11/06/2020 Carotid Arterial Duplex Study Patient Name:  Christopher Lang  Date of Exam:   11/04/2020 Medical Rec #: [id number]        Accession #:    [id number] Date of Birth: 11/01/1955        Patient Gender: M Patient Age:   47 years Exam Location:  [facility] Procedure:      VAS US CAROTID Referring Phys: Cornelius Moras Shamaine Mulkern --------------------------------------------------------------------------------  Indications:       CVA. Limitations        Today's exam was limited due to heavy calcification and the                    resulting shadowing and the patient's inability or                    unwillingness to cooperate. Comparison Study:  11-03-2020 CTA neck showed bilateral severe ICA origin                    stenosis. Performing Technologist: Darlin Coco RDMS, RVT  Examination Guidelines: A complete evaluation includes B-mode imaging, spectral Doppler, color Doppler, and power Doppler as needed of all accessible portions of each vessel. Bilateral testing is considered an integral part of a complete examination. Limited examinations for reoccurring indications may be performed as noted.  Right Carotid Findings: +----------+--------+--------+--------+----------------------+--------+           PSV cm/sEDV cm/sStenosisPlaque Description    Comments  +----------+--------+--------+--------+----------------------+--------+ CCA Prox  109     30                                             +----------+--------+--------+--------+----------------------+--------+ CCA Mid   93      27      <50%    calcific and irregular         +----------+--------+--------+--------+----------------------+--------+ CCA Distal108     32              heterogenous                   +----------+--------+--------+--------+----------------------+--------+ ICA Prox  362     148  80-99%  calcific and irregular         +----------+--------+--------+--------+----------------------+--------+ ICA Mid   348     120                                            +----------+--------+--------+--------+----------------------+--------+ ICA Distal117     38                                             +----------+--------+--------+--------+----------------------+--------+ ECA       243     25      >50%    calcific and irregular         +----------+--------+--------+--------+----------------------+--------+ +----------+--------+-------+----------------+-------------------+           PSV cm/sEDV cmsDescribe        Arm Pressure (mmHG) +----------+--------+-------+----------------+-------------------+ [id number]            Multiphasic, WNL                    +----------+--------+-------+----------------+-------------------+ +---------+--------+--+--------+--+---------+ VertebralPSV cm/s95EDV cm/s35Antegrade +---------+--------+--+--------+--+---------+  Left Carotid Findings: +----------+--------+--------+--------+--------------------+-------------------+           PSV cm/sEDV cm/sStenosisPlaque Description  Comments            +----------+--------+--------+--------+--------------------+-------------------+ CCA Prox  83      0                                                        +----------+--------+--------+--------+--------------------+-------------------+ CCA Mid   73      0       <50%    heterogenous and                                                          smooth                                  +----------+--------+--------+--------+--------------------+-------------------+ CCA Distal31      0                                                       +----------+--------+--------+--------+--------------------+-------------------+ ICA Prox                          calcific and        Unable to visualize                                   irregular           due to  calcification/shado                                                       wing                +----------+--------+--------+--------+--------------------+-------------------+ ICA Mid   88      26                                  Velocities may                                                            underestimate                                                             degree of stenosis                                                        due to more                                                               proximal                                                                  obstruction.        +----------+--------+--------+--------+--------------------+-------------------+ ICA Distal                                            Unable to evaluate                                                        due to patient  movement/weak                                                             signal              +----------+--------+--------+--------+--------------------+-------------------+ ECA       205     21      >50%    calcific and                                                               irregular                               +----------+--------+--------+--------+--------------------+-------------------+ +----------+--------+--------+----------------+-------------------+           PSV cm/sEDV cm/sDescribe        Arm Pressure (mmHG) +----------+--------+--------+----------------+-------------------+ [id number]             Multiphasic, WNL                    +----------+--------+--------+----------------+-------------------+ +---------+--------+--+--------+--+---------+ VertebralPSV cm/s54EDV cm/s16Antegrade +---------+--------+--+--------+--+---------+   Summary: Right Carotid: Velocities in the right ICA are consistent with a 80-99%                stenosis. The ECA appears >50% stenosed. Left Carotid: The ECA appears >50% stenosed. Unable to evaluate proximal ICA               secondary to severe calcification/acoustic shadowing. Recommend               correlate with CT angiogram. Vertebrals:  Bilateral vertebral arteries demonstrate antegrade flow. Subclavians: Normal flow hemodynamics were seen in bilateral subclavian              arteries. *See table(s) above for measurements and observations.  Electronically signed by Antony Contras MD on 11/06/2020 at 8:23:56 AM.    Final    CT ANGIO HEAD CODE STROKE  Result Date: 11/03/2020 CLINICAL DATA:  Assess intracranial arteries. EXAM: CT ANGIOGRAPHY HEAD AND NECK TECHNIQUE: Multidetector CT imaging of the head and neck was performed using the standard protocol during bolus administration of intravenous contrast. Multiplanar CT image reconstructions and MIPs were obtained to evaluate the vascular anatomy. Carotid stenosis measurements (when applicable) are obtained utilizing NASCET criteria, using the distal internal carotid diameter as the denominator. CONTRAST:  153m OMNIPAQUE IOHEXOL 350 MG/ML SOLN COMPARISON:  None similar FINDINGS: CTA NECK FINDINGS Aortic arch: Atheromatous plaque.   Three vessel branching. Right carotid system: Primarily calcific atherosclerosis with severe ICA origin stenosis to the degree that the lumen is not accurately measurable. No dissection. Left carotid system: Atherosclerosis with bulky mixed density plaque at the left ICA bulb with severe stenosis causing downstream faint underfilling Vertebral arteries: Proximal subclavian atherosclerosis with up to 60% left origin stenosis. Dominant right vertebral artery with atherosclerotic calcification but wide patency to the dura. Faint intermittent flow in the left vertebral artery. Skeleton: Ordinary cervical spine degeneration Other neck: No acute finding Upper chest: Paraseptal emphysema. Review of the MIP images confirms the above  findings CTA HEAD FINDINGS Anterior circulation: Heavily calcified carotid siphons with at least moderate stenosis on the right. TOn the left there is quantification due to underfilling. Right M1 occlusion with faint downstream reconstitution. Major vessels on the left show better patency. Negative for aneurysm Posterior circulation: Vertebral and basilar arteries are widely patent. Fetal type bilateral PCA flow. No PCA branch occlusion. Venous sinuses: Unremarkable Anatomic variants: As above Review of the MIP images confirms the above findings IMPRESSION: 1. Emergent large vessel occlusion at the right M1 segment. 2. Severe bilateral ICA origin stenosis with underfilling left. 3. Thready intermittent flow in the non dominant left vertebral artery. 4. 60% left subclavian origin stenosis. Electronically Signed   By: Jorje Guild M.D.   On: 11/03/2020 06:41   CT ANGIO NECK CODE STROKE  Result Date: 11/03/2020 CLINICAL DATA:  Assess intracranial arteries. EXAM: CT ANGIOGRAPHY HEAD AND NECK TECHNIQUE: Multidetector CT imaging of the head and neck was performed using the standard protocol during bolus administration of intravenous contrast. Multiplanar CT image reconstructions and MIPs were  obtained to evaluate the vascular anatomy. Carotid stenosis measurements (when applicable) are obtained utilizing NASCET criteria, using the distal internal carotid diameter as the denominator. CONTRAST:  174m OMNIPAQUE IOHEXOL 350 MG/ML SOLN COMPARISON:  None similar FINDINGS: CTA NECK FINDINGS Aortic arch: Atheromatous plaque.  Three vessel branching. Right carotid system: Primarily calcific atherosclerosis with severe ICA origin stenosis to the degree that the lumen is not accurately measurable. No dissection. Left carotid system: Atherosclerosis with bulky mixed density plaque at the left ICA bulb with severe stenosis causing downstream faint underfilling Vertebral arteries: Proximal subclavian atherosclerosis with up to 60% left origin stenosis. Dominant right vertebral artery with atherosclerotic calcification but wide patency to the dura. Faint intermittent flow in the left vertebral artery. Skeleton: Ordinary cervical spine degeneration Other neck: No acute finding Upper chest: Paraseptal emphysema. Review of the MIP images confirms the above findings CTA HEAD FINDINGS Anterior circulation: Heavily calcified carotid siphons with at least moderate stenosis on the right. TOn the left there is quantification due to underfilling. Right M1 occlusion with faint downstream reconstitution. Major vessels on the left show better patency. Negative for aneurysm Posterior circulation: Vertebral and basilar arteries are widely patent. Fetal type bilateral PCA flow. No PCA branch occlusion. Venous sinuses: Unremarkable Anatomic variants: As above Review of the MIP images confirms the above findings IMPRESSION: 1. Emergent large vessel occlusion at the right M1 segment. 2. Severe bilateral ICA origin stenosis with underfilling left. 3. Thready intermittent flow in the non dominant left vertebral artery. 4. 60% left subclavian origin stenosis. Electronically Signed   By: JJorje GuildM.D.   On: 11/03/2020 06:41       HISTORY OF PRESENT ILLNESS  Christopher DUGOis a 65y.o. male with PMH significant for glaucoma, COPD smoker, HLD, OSA, who presents with sudden onset left sided weakness.   Wife reports having nausea and dry heaving all of yesterday. Was not feeling well so went to bed at 2300. Got up at 0230 and was reporting some chest pain and feeling flushed. She called EMS. He was still moving all the arms and legs. By the time EMS arrived, he had developed left sided weakness. EKG per EMS with anterior STEMI. He was initially brought in as a code STEMI and in the ED, code stroke was activated too. Found to have anterior STEMI and right MCA occlusion.  Christopher Lang a 65y.o. male with PMH  significant for glaucoma, COPD, smoker, HLD, OSA, who presents with CP, nausea, dry heaving, and sudden onset left sided weakness. Found to have anterior STEMI and right MCA occlusion.  Stroke:  right MCA infarct due to right M1 occlusion s/p TNK and IR with TICI3 revascularization, embolic secondary to acute STEMI CT head right M1 hyperdense sign  CTA head & neck right M1 occlusion, severe b/l ICA origin stenosis with underfilling on the left, mod to severe stenosis b/l ICA siphon, 60% left subclavian origin stenosis.  Cerebral angio s/p TICI3 of right M1  MRI Patchy acute cerebral infarcts on the right more than left with right-sided petechial hemorrhage. MRA right MCA patent now. No detected flow in the intracranial left ICA but well compensated by the left posterior communicating artery. Chronic critical stenosis with underfilling of the left ICA bulb. CUS right ICA 80-99% stenosis, left ICA hard to measure due to shadowing  2D Echo EF 20-25%. The mid to distal septum/anterior segments are akinetic. The entire apex is akinetic. The myocardium appears thin. Suspect these findings represent a large wrap around LAD infarction (troponin >24,000). No LV thrombus on contrast imaging. LDL 77 HgbA1c  5.0 VTE prophylaxis - SCDs aspirin 81 mg daily and pletal 100 bid prior to admission, now on Eliquis and Plavix. Continue on discharge Therapy recommendations:  HH PT/OT Disposition:  pending   STEMI Cardiomyopathy  Anteriorlateral MI / STEMI on EKG Trop > 24,000 2D Echo EF 20-25%. The mid to distal septum/anterior segments are akinetic. The entire apex is akinetic. The myocardium appears thin. Suspect these findings represent a large wrap around LAD infarction (troponin >24,000). No LV thrombus on contrast imaging. Cardiology on board, appreciate help On Eliquis and Plavix. On metoprolol 21m bid->12.574mbid due to brady On Entresto and spironolactone   Carotid stenosis CTA head and neck - evere b/l ICA origin stenosis with underfilling on the left MRA No detected flow in the intracranial left ICA but well compensated by the left posterior communicating artery. Chronic critical stenosis with underfilling of the left ICA bulb. CUS right ICA 80-99% stenosis, left ICA hard to measure due to shadowing On Plavix Had right ICA stenting 10/19 with Dr. WaEarleen Newportlan for left carotid artery revascularization in a later date   Hypertension Hypotension  Home meds:  cardizem Initial hypotension -> hypertension  Off Neo and cleviprex On metoprolol 25 bid->12.5 bid On Entresto 24/26 and spironolactone 12.5 Stable now at 110-130 Avoid low BP Long-term BP goal normotensive   Hyperlipidemia Home meds:  lipitor 10 LDL 77, goal < 70 Increase lipitor to 40  Continue statin at discharge   Tobacco abuse Current smoker Smoking cessation counseling will be provided   Other Stroke Risk Factors Advanced Age >/= 6589Obstructive sleep apnea, on CPAP at home Congestive heart failure   Other Active Problems Leukocytosis WBC 17.3->12.3->9.5-> 9.3 COPD  DISCHARGE EXAM Blood pressure 130/90, pulse 74, temperature 97.8 F (36.6 C), temperature source Oral, resp. rate 18, height 6' (1.829 m),  weight 93 kg, SpO2 100 %.  General - Well nourished, well developed, not in acute distress.   Ophthalmologic - fundi not visualized due to noncooperation.   Cardiovascular - Regular rate and rhythm.   Neuro - awake, alert, eyes open, orientated to age, place, time. No aphasia, mild dysarthria, following all simple commands. Able to name and repeat. No gaze palsy, tracking bilaterally, visual field full, PERRL. Slight left nasolabial fold flattening. Tongue midline. BUEs and BLEs equal strength symmetrical. Sensation  decreased on the left UE and LE, b/l FTN intact grossly, gait not tested.     Discharge Diet       Diet   Diet Heart Room service appropriate? Yes; Fluid consistency: Thin   liquids  DISCHARGE PLAN Disposition:  home with [facility] clopidogrel 75 mg daily and Eliquis (apixaban) daily for secondary stroke prevention until seen by neurology.  Ongoing stroke risk factor control by Primary Care Physician at time of discharge Follow-up PCP [place], Fransisca Kaufmann, MD in 2 weeks. Follow-up in [facility] in 4 weeks, office to schedule an appointment.   45  minutes were spent preparing discharge.  Rosalin Hawking, MD PhD Stroke Neurology 11/08/2020 11:40 AM

## 2020-11-08 NOTE — Care Management Important Message (Signed)
Important Message  Patient Details  Name: Christopher Lang MRN: [id number] Date of Birth: February 03, 1955   Medicare Important Message Given:  Yes     Orbie Pyo 11/08/2020, 3:28 PM

## 2020-11-08 NOTE — TOC Transition Note (Signed)
Transition of Care [facility]) - CM/SW Discharge Note   Patient Details  Name: Christopher Lang MRN: [id number] Date of Birth: 01-Aug-1955  Transition of Care [facility]) CM/SW Contact:  Pollie Friar, RN Phone Number: 11/08/2020, 1:32 PM   Clinical Narrative:    Patient is discharging home with home health services through [place]. Information on the AVS.  Pt has needed DME at home.  Pt provided 30 day free vouchers for [employer] and Eliquis.  Wife is able to provide needed supervision at home.  Pt has transport home today.   Final next level of care: Home w Home Health Services Barriers to Discharge: No Barriers Identified   Patient Goals and CMS Choice   CMS Medicare.gov Compare Post Acute Care list provided to:: Patient Represenative (must comment) Choice offered to / list presented to : Patient, Spouse  Discharge Placement                       Discharge Plan and Services                          HH Arranged: PT, OT [place] Agency: [place] ([facility]) Date [facility] Agency Contacted: 11/08/20   Representative spoke with at [place]: [place] ([place]) Interventions     Readmission Risk Interventions No flowsheet data found.

## 2020-11-08 NOTE — Discharge Instructions (Signed)

## 2020-11-08 NOTE — Progress Notes (Addendum)
PT planning to see pt today therefore will defer ambulation to them. Discussed with pt and wife MI, HF management, daily wts, low sodium, smoking cessation, walking as tolerated, and CRPII. Receptive although pt distracted at times. He plans to use nicotine patches to quit smoking. Will refer to [facility].  [id number] [place], ACSM 11/08/2020 11:55 AM

## 2020-11-08 NOTE — Progress Notes (Addendum)
Progress Note  Patient Name: Christopher Lang Date of Encounter: 11/08/2020  [facility] Cardiologist: Rozann Lesches, MD   Subjective   Feels well.  Getting swallow eval ad speech therapy.   Inpatient Medications    Scheduled Meds:  apixaban  5 mg Oral BID   atorvastatin  40 mg Oral q1800   Chlorhexidine Gluconate Cloth  6 each Topical Daily   cholecalciferol  1,000 Units Oral Daily   clopidogrel  75 mg Oral Daily   labetalol  20 mg Intravenous Once   metoprolol tartrate  12.5 mg Oral BID   nicotine  14 mg Transdermal Daily   sacubitril-valsartan  1 tablet Oral BID   sodium chloride flush  3 mL Intravenous Once   spironolactone  12.5 mg Oral Daily   varenicline  0.5 mg Oral BID   Continuous Infusions:  PRN Meds: acetaminophen **OR** acetaminophen (TYLENOL) oral liquid 160 mg/5 mL **OR** acetaminophen, hydrALAZINE, senna-docusate   Vital Signs    Vitals:   11/07/20 2024 11/08/20 0029 11/08/20 0411 11/08/20 0746  BP: 138/79 (!) 127/53 (!) 143/86 130/90  Pulse: 78 (!) 57 80 74  Resp: _0 Temp: (!) 97.5 F (36.4 C) (!) 97.5 F (36.4 C) (!) 97.5 F (36.4 C) 97.8 F (36.6 C)  TempSrc: Oral Oral Oral Oral  SpO2: 96% 100% 96% 100%  Weight:      Height:        Intake/Output Summary (Last 24 hours) at 11/08/2020 1029 Last data filed at 11/07/2020 1411 Gross per 24 hour  Intake 240 ml  Output --  Net 240 ml   Last 3 Weights 11/06/2020 11/03/2020 12/12/2019  Weight (lbs) 205 lb 193 lb 2 oz 207 lb  Weight (kg) 92.987 kg 87.6 kg 93.895 kg      Telemetry    NSR - Personally Reviewed  ECG      Physical Exam   GEN: No acute distress.   Neck: No JVD Cardiac: RRR, no murmurs, rubs, or gallops.  Respiratory: Clear to auscultation bilaterally. GI: Soft, nontender, non-distended  MS: No edema; No deformity. Neuro:  Nonfocal  Psych: Normal affect   Labs    High Sensitivity Troponin:   Recent Labs  Lab 11/03/20 1423 11/03/20 2227   TROPONINIHS >24,000* >24,000*     Chemistry Recent Labs  Lab 11/03/20 0603 11/03/20 0622 11/06/20 0425 11/07/20 0302 11/08/20 0145  NA 133*   < > 129* 135 135  K 3.8   < > 2.9* 3.4* 3.1*  CL 98   < > 100 104 105  CO2 25   < > _1 GLUCOSE 194*   < > 99 90 102*  BUN 11   < > _2 CREATININE 0.93   < > 0.80 0.76 0.82  CALCIUM 10.2   < > 8.9 9.2 9.2  PROT 7.5  --   --   --   --   ALBUMIN 4.0  --   --   --   --   AST 83*  --   --   --   --   ALT 30  --   --   --   --   ALKPHOS 131*  --   --   --   --   BILITOT 1.8*  --   --   --   --   GFRNONAA >60   < > >60 >60 >60  ANIONGAP 10   < >  _0 < > = values in this interval not displayed.    Lipids  Recent Labs  Lab 11/04/20 0614  CHOL 159  TRIG 187*  HDL 31*  LDLCALC 91  CHOLHDL 5.1    Hematology Recent Labs  Lab 11/06/20 0425 11/07/20 0302 11/08/20 0145  WBC 9.5 9.3 9.0  RBC 3.87* 3.79* 3.75*  HGB 12.9* 12.8* 12.4*  HCT 37.4* 37.1* 35.8*  MCV 96.6 97.9 95.5  MCH 33.3 33.8 33.1  MCHC 34.5 34.5 34.6  RDW 12.2 12.1 12.0  PLT 134* 129* 137*   Thyroid No results for input(s): TSH, FREET4 in the last 168 hours.  BNPNo results for input(s): BNP, PROBNP in the last 168 hours.  DDimer No results for input(s): DDIMER in the last 168 hours.   Radiology    IR INTRAVSC STENT CERV CAROTID W/EMB-PROT MOD SED  Result Date: 11/06/2020 INDICATION: 65 year old male with a history of symptomatic high-grade right carotid stenosis, high risk for surgery given cardiac issues EXAM: ULTRASOUND-GUIDED ACCESS RIGHT COMMON FEMORAL ARTERY RIGHT CAROTID STENT WITH EMBOLIC PROTECTION MEDICATIONS: 8000 UNITS IV HEPARIN. 300 mg p.o. Plavix ANESTHESIA/SEDATION: The anesthesia team was present to provide MAC anesthesia and for patient monitoring during the procedure. Left radial arterial line was performed by the anesthesia team. Interventional neuro radiology nursing staff was also present. FLUOROSCOPY TIME:  Fluoroscopy Time: 6  minutes 42 seconds (491 mGy). COMPLICATIONS: None TECHNIQUE: Informed written consent was obtained from the patient and the patient's family after a thorough discussion of the procedural risks, benefits and alternatives. Specific risks discussed include: Bleeding, infection, contrast reaction, kidney injury/failure, need for further procedure/surgery, arterial injury or dissection, embolization to new territory, revascularization syndrome, neurologic deterioration, cardiopulmonary collapse, death. All questions were addressed. Maximal Sterile Barrier Technique was utilized including during the procedure including caps, mask, sterile gowns, sterile gloves, sterile drape, hand hygiene and skin antiseptic. A timeout was performed prior to the initiation of the procedure. The anesthesia team was present to provide general endotracheal tube anesthesia and for patient monitoring during the procedure. Interventional neuro radiology nursing staff was also present. PROCEDURE: After the anesthesia team initiated general anesthesia, a time-out was performed. The patient is prepped and draped in the usual sterile fashion, room 2 in the neuro IR suite. Ultrasound survey of the right inguinal region was performed with images stored and sent to PACs. Ultrasound demonstrates patent right common femoral artery. 11 blade scalpel was used to make a small incision. Blunt dissection was performed with ultrasound guidance. A micropuncture needle was used access the right common femoral artery under ultrasound. With excellent arterial blood flow returned, an .018 micro wire was passed through the needle, observed to enter the abdominal aorta under fluoroscopy. The needle was removed, and a micropuncture sheath was placed over the wire. The inner dilator and wire were removed, and an 035 Bentson wire was advanced under fluoroscopy into the abdominal aorta. The sheath was removed and a standard 5 [place] vascular sheath was placed. The  dilator was removed and the sheath was flushed. A 7F JB-1 diagnostic catheter was advanced over the wire to the proximal descending thoracic aorta. Wire was then removed. Double flush of the catheter was performed. We then attempted four-vessel cerebral angiogram. Given that the patient had a sub-optimal response to MAC anesthesia, and his heart precluded general anesthesia, we elected to abort a complete 4 vessel angiogram. Angiogram of the left cervical carotid system was performed to document status of the right ICA  at the bifurcation. Images were reviewed. We then proceeded with our treatment plan. 8000 units of IV heparin was administered. The JB 1 catheter was used to select the innominate artery, directed then into the right common carotid artery. With an adequate catheter position in the common carotid artery, roadmap angiogram was performed. The catheter was then navigated with the assistance of a Glidewire into the external carotid artery branches. Wire was removed and a exchange length wire was placed. Catheter was removed on the Rosen wire. The 5 French sheath was then removed and exchanged for 8 French 55 centimeter BrightTip sheath. Sheath was flushed and attached to pressurized and heparinized saline bag for constant forward flow. Then an 8 [place], 95 cm wall wrist balloon tip catheter was prepared on the back table with inflation of the balloon with 50/50 concentration of dilute contrast. The balloon catheter was then advanced over the wire, positioned into the distal right common carotid artery. Copious back flush was performed and the balloon catheter was attached to heparinized and pressurized saline bag for forward flow. Angiogram was then performed. After review of the images and measurements, we elected to proceed with distal embolic protection using Nav 6 device, 4 - 81m size. We selected an 9-7 x 40 mm Xact stent, with a plan for pre-dilation 5 mm x 20 mm balloon angioplasty with a Viatrac  balloon. These devices were prepped on the back table, including the retrieval device of the Nav 6. Confirmation angiogram was performed for road map guide. The bare wire on the NAV6 was advanced through the stenotic region of the ICA, into a distal position within the cervical ICA. The Nav-6 device was then advanced into a safe location above the carotid lesion with deployment of the Nav 6. Timer was started, and a gentle infusion of contrast confirmed forward flow. The deployment device was removed on the bare wire and was disposed. 5 mm balloon angioplasty was then performed. 10 atmospheres was observed. The patient remained hemodynamically stable throughout this maneuver. Balloon was removed and disposed. The selected stent was then deployed at the lesion with the deployment device removed from the bare wire and disposed. Angiogram was performed. The retrieval device was then passed over the bare wire and the Nav 6 embolic protection device was retrieved. The entire unit was removed from the balloon guide catheter under fluoroscopic observation as this traversed the new stent. Less than 9 minutes of embolic protection open time. Angiogram was performed. We then observed 5 minutes interval and a final angiogram was performed. The 55 centimeter 8 French sheath was removed and 7 [place] was deployed. Patient tolerated the procedure well and remained hemodynamically stable throughout. No complications were encountered and no significant blood loss encountered. FINDINGS: Initial Findings: Left common carotid artery: Normal course caliber and contour with independent origin from the aortic arch. Left external carotid artery: Patent with antegrade flow with moderate atherosclerosis at the origin. Left internal carotid artery: Circumferential calcifications with high-grade stenosis at the origin corresponding to the duplex exam that was recently performed of 80%-99% stenosis, on the high end. Slow flow through the  distal cervical segment of the ICA. Diagnostic images of the distal cervical segment and of the intracranial portion were not capable given the patient's suboptimal response to the anesthesia and motion artifact. Right common carotid artery:  Normal course caliber and contour. Right external carotid artery: Patent with antegrade flow. Right internal carotid artery: Circumferential calcific plaque at the proximal right ICA, with irregular  margins and greater than 50% residual stenosis after the previous treatment at the time of the stroke treatment. Completion: After angioplasty and stenting there is approximately 30% residual stenosis. Right MCA: M1 segment patent. Insular and opercular segments patent. Unremarkable caliber and course of the cortical segments. Typical arterial, capillary/ parenchymal, and venous phase. Right ACA: A 1 segment patent. A 2 segment perfuses the right territory. Patent anterior communicating artery rapidly fills the left hemispheric MCA branches IMPRESSION: Status post ultrasound guided access right common femoral artery for right cervical ICA stenting with embolic protection, for treatment of high-grade symptomatic stenosis, high risk for surgery. Angiogram demonstrates a left cervical ICA critical stenosis. Signed, Dulcy Fanny. Dellia Nims, RPVI [facility] Radiology Electronically Signed   By: Corrie Mckusick D.O.   On: 11/06/2020 16:15   IR US Guide Vasc Access Right  Result Date: 11/06/2020 INDICATION: 65 year old male with a history of symptomatic high-grade right carotid stenosis, high risk for surgery given cardiac issues EXAM: ULTRASOUND-GUIDED ACCESS RIGHT COMMON FEMORAL ARTERY RIGHT CAROTID STENT WITH EMBOLIC PROTECTION MEDICATIONS: 8000 UNITS IV HEPARIN. 300 mg p.o. Plavix ANESTHESIA/SEDATION: The anesthesia team was present to provide MAC anesthesia and for patient monitoring during the procedure. Left radial arterial line was  performed by the anesthesia team. Interventional neuro radiology nursing staff was also present. FLUOROSCOPY TIME:  Fluoroscopy Time: 6 minutes 42 seconds (491 mGy). COMPLICATIONS: None TECHNIQUE: Informed written consent was obtained from the patient and the patient's family after a thorough discussion of the procedural risks, benefits and alternatives. Specific risks discussed include: Bleeding, infection, contrast reaction, kidney injury/failure, need for further procedure/surgery, arterial injury or dissection, embolization to new territory, revascularization syndrome, neurologic deterioration, cardiopulmonary collapse, death. All questions were addressed. Maximal Sterile Barrier Technique was utilized including during the procedure including caps, mask, sterile gowns, sterile gloves, sterile drape, hand hygiene and skin antiseptic. A timeout was performed prior to the initiation of the procedure. The anesthesia team was present to provide general endotracheal tube anesthesia and for patient monitoring during the procedure. Interventional neuro radiology nursing staff was also present. PROCEDURE: After the anesthesia team initiated general anesthesia, a time-out was performed. The patient is prepped and draped in the usual sterile fashion, room 2 in the neuro IR suite. Ultrasound survey of the right inguinal region was performed with images stored and sent to PACs. Ultrasound demonstrates patent right common femoral artery. 11 blade scalpel was used to make a small incision. Blunt dissection was performed with ultrasound guidance. A micropuncture needle was used access the right common femoral artery under ultrasound. With excellent arterial blood flow returned, an .018 micro wire was passed through the needle, observed to enter the abdominal aorta under fluoroscopy. The needle was removed, and a micropuncture sheath was placed over the wire. The inner dilator and wire were removed, and an 035 Bentson wire was  advanced under fluoroscopy into the abdominal aorta. The sheath was removed and a standard 5 [place] vascular sheath was placed. The dilator was removed and the sheath was flushed. A 42F JB-1 diagnostic catheter was advanced over the wire to the proximal descending thoracic aorta. Wire was then removed. Double flush of the catheter was performed. We then attempted four-vessel cerebral angiogram. Given that the patient had a sub-optimal response to MAC anesthesia, and his heart precluded general anesthesia, we elected to abort a complete 4 vessel angiogram. Angiogram of the left cervical carotid system was performed to document status of the right ICA at the  bifurcation. Images were reviewed. We then proceeded with our treatment plan. 8000 units of IV heparin was administered. The JB 1 catheter was used to select the innominate artery, directed then into the right common carotid artery. With an adequate catheter position in the common carotid artery, roadmap angiogram was performed. The catheter was then navigated with the assistance of a Glidewire into the external carotid artery branches. Wire was removed and a exchange length wire was placed. Catheter was removed on the Rosen wire. The 5 French sheath was then removed and exchanged for 8 French 55 centimeter BrightTip sheath. Sheath was flushed and attached to pressurized and heparinized saline bag for constant forward flow. Then an 8 [place], 95 cm wall wrist balloon tip catheter was prepared on the back table with inflation of the balloon with 50/50 concentration of dilute contrast. The balloon catheter was then advanced over the wire, positioned into the distal right common carotid artery. Copious back flush was performed and the balloon catheter was attached to heparinized and pressurized saline bag for forward flow. Angiogram was then performed. After review of the images and measurements, we elected to proceed with distal embolic protection using Nav 6 device,  4 - 75m size. We selected an 9-7 x 40 mm Xact stent, with a plan for pre-dilation 5 mm x 20 mm balloon angioplasty with a Viatrac balloon. These devices were prepped on the back table, including the retrieval device of the Nav 6. Confirmation angiogram was performed for road map guide. The bare wire on the NAV6 was advanced through the stenotic region of the ICA, into a distal position within the cervical ICA. The Nav-6 device was then advanced into a safe location above the carotid lesion with deployment of the Nav 6. Timer was started, and a gentle infusion of contrast confirmed forward flow. The deployment device was removed on the bare wire and was disposed. 5 mm balloon angioplasty was then performed. 10 atmospheres was observed. The patient remained hemodynamically stable throughout this maneuver. Balloon was removed and disposed. The selected stent was then deployed at the lesion with the deployment device removed from the bare wire and disposed. Angiogram was performed. The retrieval device was then passed over the bare wire and the Nav 6 embolic protection device was retrieved. The entire unit was removed from the balloon guide catheter under fluoroscopic observation as this traversed the new stent. Less than 9 minutes of embolic protection open time. Angiogram was performed. We then observed 5 minutes interval and a final angiogram was performed. The 55 centimeter 8 French sheath was removed and 7 [place] was deployed. Patient tolerated the procedure well and remained hemodynamically stable throughout. No complications were encountered and no significant blood loss encountered. FINDINGS: Initial Findings: Left common carotid artery: Normal course caliber and contour with independent origin from the aortic arch. Left external carotid artery: Patent with antegrade flow with moderate atherosclerosis at the origin. Left internal carotid artery: Circumferential calcifications with high-grade stenosis at the  origin corresponding to the duplex exam that was recently performed of 80%-99% stenosis, on the high end. Slow flow through the distal cervical segment of the ICA. Diagnostic images of the distal cervical segment and of the intracranial portion were not capable given the patient's suboptimal response to the anesthesia and motion artifact. Right common carotid artery:  Normal course caliber and contour. Right external carotid artery: Patent with antegrade flow. Right internal carotid artery: Circumferential calcific plaque at the proximal right ICA, with irregular margins and  greater than 50% residual stenosis after the previous treatment at the time of the stroke treatment. Completion: After angioplasty and stenting there is approximately 30% residual stenosis. Right MCA: M1 segment patent. Insular and opercular segments patent. Unremarkable caliber and course of the cortical segments. Typical arterial, capillary/ parenchymal, and venous phase. Right ACA: A 1 segment patent. A 2 segment perfuses the right territory. Patent anterior communicating artery rapidly fills the left hemispheric MCA branches IMPRESSION: Status post ultrasound guided access right common femoral artery for right cervical ICA stenting with embolic protection, for treatment of high-grade symptomatic stenosis, high risk for surgery. Angiogram demonstrates a left cervical ICA critical stenosis. Signed, Dulcy Fanny. Dellia Nims, RPVI [facility] Radiology Electronically Signed   By: Corrie Mckusick D.O.   On: 11/06/2020 16:15    Cardiac Studies   S/p anterior MI and CVA  Patient Profile     65 y.o. male with PAF and the above acute issues  Assessment & Plan    S/p intervention for CVA.  Cardiac: acute systolic heart failure.  Euvolemic.  Anterior MI with high risk of apical thrombus.  Plan to treat with ELiquis.  Can be crushed if this is easier for patient swallowing.  Clopidogrel and  ELiquis.  No aspirin to reduce bleeding risk.   HTN: Controlled.    Will arrange cardiac f/u.     For questions or updates, please contact [place] Please consult [email or web address] for contact info under        Signed, Larae Grooms, MD  11/08/2020, 10:29 AM

## 2020-11-08 NOTE — Progress Notes (Signed)
Occupational Therapy Treatment Patient Details Name: Christopher Lang MRN: [id number] DOB: 1955-12-11 Today's Date: 11/08/2020   History of present illness 65 yo male admitted with CP and noted to have L side weakness. CT dense R M1 segment remote cortical infarcts on L. Angiogram R ICA bifurcation mechanical thrombectomy of R M1 performed, s/p US guided R CFA access, R cervical/cerebral angio and stent placement for recurrent R ICA high grade stenosis on 10/19.  10/16 extubated, EKG showed anteriorlateral STEMI.  PMH COPD HTn Afib HLD Sleep apnea   OT comments  Pt. Seen for skilled OT treatment session.  Wife present and very helpful and active in his care.  Pt. Min guard/min a with in room ambulation to sink without use of DME.  Safe technique demonstrated with intermittent furniture walking.  Pt. Able to assist with UB B/D.  Wife to assist with and LB needs.  Pt. Eager for d/c home anticipated today.  Pt. And spouse had no further questions or concerns prior to home.     Recommendations for follow up therapy are one component of a multi-disciplinary discharge planning process, led by the attending physician.  Recommendations may be updated based on patient status, additional functional criteria and insurance authorization.    Follow Up Recommendations  Home health OT    Equipment Recommendations  None recommended by OT    Recommendations for Other Services      Precautions / Restrictions Precautions Precautions: Fall Precaution Comments: watch HR (bradycardia) Restrictions Weight Bearing Restrictions: (P) No       Mobility Bed Mobility Overal bed mobility: Needs Assistance Bed Mobility: Supine to Sit     Supine to sit: Supervision          Transfers Overall transfer level: Needs assistance Equipment used: None Transfers: Sit to/from [employer] Transfers Sit to Stand: Min guard;Min assist Stand pivot transfers: Min guard;Min assist       General transfer  comment: intermittent hha to ambulate from eob to sink counter    Balance                                           ADL either performed or assessed with clinical judgement   ADL Overall ADL's : Needs assistance/impaired     Grooming: Wash/dry face;Wash/dry hands;Sitting;Set up   Upper Body Bathing: Minimal assistance;Sitting Upper Body Bathing Details (indicate cue type and reason): cues to avoid getting cardiac monitors wet while washing up     Upper Body Dressing : Min guard;Sitting Upper Body Dressing Details (indicate cue type and reason): assistance to reposition cardiac monitor through his gown     Toilet Transfer: Min guard Toilet Transfer Details (indicate cue type and reason): simulated during in room ambulation and transfer from eob to sink counter and then seated at sink           General ADL Comments: pt. moving well.  wife present and active in his care.  pt. and spouse wanting privacy during LB bathing so i left after ub portions.  pt.s spouse reports she feels comfortable assisting and has been ambulating with him in the room prior     Vision       Perception     Praxis      Cognition Arousal/Alertness: Awake/alert Behavior During Therapy: [facility] for tasks assessed/performed Overall Cognitive Status: Impaired/Different from baseline Area of Impairment: Safety/judgement;Awareness  Safety/Judgement: Decreased awareness of deficits;Decreased awareness of safety              Exercises     Shoulder Instructions       General Comments  Loves to watch [employer] shows    Pertinent Vitals/ Pain       Pain Assessment: No/denies pain  Home Living                                          Prior Functioning/Environment              Frequency  Min 2X/week        Progress Toward Goals  OT Goals(current goals can now be found in the care plan section)  Progress towards OT  goals: Progressing toward goals     Plan Discharge plan remains appropriate;Discharge plan needs to be updated    Co-evaluation                 AM-PAC OT "6 Clicks" Daily Activity     Outcome Measure   Help from another person eating meals?: A Little Help from another person taking care of personal grooming?: A Little Help from another person toileting, which includes using toliet, bedpan, or urinal?: A Little Help from another person bathing (including washing, rinsing, drying)?: A Little Help from another person to put on and taking off regular upper body clothing?: A Little Help from another person to put on and taking off regular lower body clothing?: A Little 6 Click Score: 18    End of Session    OT Visit Diagnosis: Unsteadiness on feet (R26.81);Muscle weakness (generalized) (M62.81)   Activity Tolerance Patient tolerated treatment well   Patient Left with family/visitor present;Other (comment) (bathing at sink with wife)   Nurse Communication Other (comment) (requested cna check in on them during bathing to see if any additional assistance needed)        Time: 1042-1101 OT Time Calculation (min): 19 min  Charges: OT General Charges $OT Visit: 1 Visit OT Treatments $Self Care/Home Management : 8-22 mins  Sonia Baller, COTA/L Acute Rehabilitation [phone]   Tanya Nones 11/08/2020, 11:20 AM

## 2020-11-08 NOTE — Progress Notes (Signed)
Speech Language Pathology Treatment: Dysphagia  Patient Details Name: Christopher Lang MRN: [id number] DOB: 1955/07/14 Today's Date: 11/08/2020 Time: 7408-1448 SLP Time Calculation (min) (ACUTE ONLY): 15 min  Assessment / Plan / Recommendation Clinical Impression  Pt seen for follow up re: compensatory strategies for oral pocketing and safe swallowing with significant other at beside. There was minimal residue after cracker on upper left dentition and buccal cavity. He can recall strategies but required intermittent supervision to execute. Reminded of toothettes at bedside, liquid wash and masticating on stronger side. He denied instances of coughing with liquids since last visit and consume water via cup without incidence. He reported use of applesauce with pills has assisted transit and control significantly. He is being discharged home today. No further ST needed for swallow and continue regular/thin liquids. His speech is intelligible but not at baseline per significant other which he can seek continued ST if desired.    HPI HPI: 65 year old man brought in by EMS complaining of chest pain and noted to have left-sided weakness.   Head CT showed dense right M1 segment, remote cortical infarcts on left .  CT angiogram confirmed large vessel occlusion of right M1 segment with severe bilateral ICA origin stenosis. Angiogram showed high-grade stenosis of right ICA bifurcation, treated by PTA, mechanical thrombectomy of right M1 performed. Intubated for procedure.      SLP Plan  All goals met      Recommendations for follow up therapy are one component of a multi-disciplinary discharge planning process, led by the attending physician.  Recommendations may be updated based on patient status, additional functional criteria and insurance authorization.    Recommendations  Diet recommendations: Regular;Thin liquid Liquids provided via: Cup Medication Administration: Whole meds with  puree Supervision: Patient able to self feed Compensations: Slow rate;Lingual sweep for clearance of pocketing;Small sips/bites;Chin tuck;Other (Comment) Postural Changes and/or Swallow Maneuvers: Out of bed for meals                Oral Care Recommendations: Oral care BID Follow up Recommendations: None SLP Visit Diagnosis: Dysphagia, oral phase (R13.11) Plan: All goals met       GO                Houston Siren  11/08/2020, 11:07 AM Orbie Pyo Colvin Caroli.Ed Risk analyst [phone] Office [phone]

## 2020-11-11 ENCOUNTER — Telehealth: Payer: Self-pay

## 2020-11-11 ENCOUNTER — Other Ambulatory Visit: Payer: Self-pay | Admitting: *Deleted

## 2020-11-11 DIAGNOSIS — H409 Unspecified glaucoma: Secondary | ICD-10-CM | POA: Diagnosis not present

## 2020-11-11 DIAGNOSIS — F17210 Nicotine dependence, cigarettes, uncomplicated: Secondary | ICD-10-CM | POA: Diagnosis not present

## 2020-11-11 DIAGNOSIS — I429 Cardiomyopathy, unspecified: Secondary | ICD-10-CM | POA: Diagnosis not present

## 2020-11-11 DIAGNOSIS — Z7901 Long term (current) use of anticoagulants: Secondary | ICD-10-CM | POA: Diagnosis not present

## 2020-11-11 DIAGNOSIS — R262 Difficulty in walking, not elsewhere classified: Secondary | ICD-10-CM | POA: Diagnosis not present

## 2020-11-11 DIAGNOSIS — M6281 Muscle weakness (generalized): Secondary | ICD-10-CM | POA: Diagnosis not present

## 2020-11-11 DIAGNOSIS — E785 Hyperlipidemia, unspecified: Secondary | ICD-10-CM | POA: Diagnosis not present

## 2020-11-11 DIAGNOSIS — I959 Hypotension, unspecified: Secondary | ICD-10-CM | POA: Diagnosis not present

## 2020-11-11 DIAGNOSIS — Z7902 Long term (current) use of antithrombotics/antiplatelets: Secondary | ICD-10-CM | POA: Diagnosis not present

## 2020-11-11 DIAGNOSIS — I2109 ST elevation (STEMI) myocardial infarction involving other coronary artery of anterior wall: Secondary | ICD-10-CM | POA: Diagnosis not present

## 2020-11-11 DIAGNOSIS — G4733 Obstructive sleep apnea (adult) (pediatric): Secondary | ICD-10-CM | POA: Diagnosis not present

## 2020-11-11 DIAGNOSIS — Z95828 Presence of other vascular implants and grafts: Secondary | ICD-10-CM | POA: Diagnosis not present

## 2020-11-11 DIAGNOSIS — J449 Chronic obstructive pulmonary disease, unspecified: Secondary | ICD-10-CM | POA: Diagnosis not present

## 2020-11-11 DIAGNOSIS — I69354 Hemiplegia and hemiparesis following cerebral infarction affecting left non-dominant side: Secondary | ICD-10-CM | POA: Diagnosis not present

## 2020-11-11 DIAGNOSIS — I6523 Occlusion and stenosis of bilateral carotid arteries: Secondary | ICD-10-CM | POA: Diagnosis not present

## 2020-11-11 DIAGNOSIS — I10 Essential (primary) hypertension: Secondary | ICD-10-CM | POA: Diagnosis not present

## 2020-11-11 DIAGNOSIS — I63511 Cerebral infarction due to unspecified occlusion or stenosis of right middle cerebral artery: Secondary | ICD-10-CM | POA: Diagnosis not present

## 2020-11-11 NOTE — Patient Outreach (Signed)
[facility]) Care Management  11/11/2020  Christopher Lang 08-25-1955 [id number]   RED ON EMMI ALERT - Stroke Day # 1 Date: 10/23 Red Alert Reason: Questions/problems with medications   Outreach attempt #1, unsuccessful, unable to leave voice message.   Plan: RN CM will send outreach letter and follow up within the next 3-4 business days.  [name], [place], MSN [place] [phone]

## 2020-11-11 NOTE — Telephone Encounter (Signed)
I called to make patient a hospital f/u / TCM appt, but he no longer wants to be a patient here. Says he will see Dr Sherrie Sport from now on - has appt with him 10/26 @ 1:30

## 2020-11-13 DIAGNOSIS — I429 Cardiomyopathy, unspecified: Secondary | ICD-10-CM | POA: Diagnosis not present

## 2020-11-13 DIAGNOSIS — I6523 Occlusion and stenosis of bilateral carotid arteries: Secondary | ICD-10-CM | POA: Diagnosis not present

## 2020-11-13 DIAGNOSIS — Z7901 Long term (current) use of anticoagulants: Secondary | ICD-10-CM | POA: Diagnosis not present

## 2020-11-13 DIAGNOSIS — H409 Unspecified glaucoma: Secondary | ICD-10-CM | POA: Diagnosis not present

## 2020-11-13 DIAGNOSIS — I5022 Chronic systolic (congestive) heart failure: Secondary | ICD-10-CM | POA: Diagnosis not present

## 2020-11-13 DIAGNOSIS — G4733 Obstructive sleep apnea (adult) (pediatric): Secondary | ICD-10-CM | POA: Diagnosis not present

## 2020-11-13 DIAGNOSIS — E785 Hyperlipidemia, unspecified: Secondary | ICD-10-CM | POA: Diagnosis not present

## 2020-11-13 DIAGNOSIS — Z7902 Long term (current) use of antithrombotics/antiplatelets: Secondary | ICD-10-CM | POA: Diagnosis not present

## 2020-11-13 DIAGNOSIS — F17210 Nicotine dependence, cigarettes, uncomplicated: Secondary | ICD-10-CM | POA: Diagnosis not present

## 2020-11-13 DIAGNOSIS — I10 Essential (primary) hypertension: Secondary | ICD-10-CM | POA: Diagnosis not present

## 2020-11-13 DIAGNOSIS — R262 Difficulty in walking, not elsewhere classified: Secondary | ICD-10-CM | POA: Diagnosis not present

## 2020-11-13 DIAGNOSIS — I480 Paroxysmal atrial fibrillation: Secondary | ICD-10-CM | POA: Diagnosis not present

## 2020-11-13 DIAGNOSIS — I63511 Cerebral infarction due to unspecified occlusion or stenosis of right middle cerebral artery: Secondary | ICD-10-CM | POA: Diagnosis not present

## 2020-11-13 DIAGNOSIS — E782 Mixed hyperlipidemia: Secondary | ICD-10-CM | POA: Diagnosis not present

## 2020-11-13 DIAGNOSIS — G4739 Other sleep apnea: Secondary | ICD-10-CM | POA: Diagnosis not present

## 2020-11-13 DIAGNOSIS — I959 Hypotension, unspecified: Secondary | ICD-10-CM | POA: Diagnosis not present

## 2020-11-13 DIAGNOSIS — I69354 Hemiplegia and hemiparesis following cerebral infarction affecting left non-dominant side: Secondary | ICD-10-CM | POA: Diagnosis not present

## 2020-11-13 DIAGNOSIS — J449 Chronic obstructive pulmonary disease, unspecified: Secondary | ICD-10-CM | POA: Diagnosis not present

## 2020-11-13 DIAGNOSIS — M6281 Muscle weakness (generalized): Secondary | ICD-10-CM | POA: Diagnosis not present

## 2020-11-13 DIAGNOSIS — Z95828 Presence of other vascular implants and grafts: Secondary | ICD-10-CM | POA: Diagnosis not present

## 2020-11-13 DIAGNOSIS — I2109 ST elevation (STEMI) myocardial infarction involving other coronary artery of anterior wall: Secondary | ICD-10-CM | POA: Diagnosis not present

## 2020-11-14 ENCOUNTER — Encounter: Payer: Self-pay | Admitting: *Deleted

## 2020-11-14 ENCOUNTER — Other Ambulatory Visit: Payer: Self-pay | Admitting: *Deleted

## 2020-11-14 DIAGNOSIS — H409 Unspecified glaucoma: Secondary | ICD-10-CM | POA: Diagnosis not present

## 2020-11-14 DIAGNOSIS — I429 Cardiomyopathy, unspecified: Secondary | ICD-10-CM | POA: Diagnosis not present

## 2020-11-14 DIAGNOSIS — I959 Hypotension, unspecified: Secondary | ICD-10-CM | POA: Diagnosis not present

## 2020-11-14 DIAGNOSIS — M6281 Muscle weakness (generalized): Secondary | ICD-10-CM | POA: Diagnosis not present

## 2020-11-14 DIAGNOSIS — I10 Essential (primary) hypertension: Secondary | ICD-10-CM | POA: Diagnosis not present

## 2020-11-14 DIAGNOSIS — E785 Hyperlipidemia, unspecified: Secondary | ICD-10-CM | POA: Diagnosis not present

## 2020-11-14 DIAGNOSIS — Z7902 Long term (current) use of antithrombotics/antiplatelets: Secondary | ICD-10-CM | POA: Diagnosis not present

## 2020-11-14 DIAGNOSIS — I69354 Hemiplegia and hemiparesis following cerebral infarction affecting left non-dominant side: Secondary | ICD-10-CM | POA: Diagnosis not present

## 2020-11-14 DIAGNOSIS — J449 Chronic obstructive pulmonary disease, unspecified: Secondary | ICD-10-CM | POA: Diagnosis not present

## 2020-11-14 DIAGNOSIS — I2109 ST elevation (STEMI) myocardial infarction involving other coronary artery of anterior wall: Secondary | ICD-10-CM | POA: Diagnosis not present

## 2020-11-14 DIAGNOSIS — G4733 Obstructive sleep apnea (adult) (pediatric): Secondary | ICD-10-CM | POA: Diagnosis not present

## 2020-11-14 DIAGNOSIS — Z95828 Presence of other vascular implants and grafts: Secondary | ICD-10-CM | POA: Diagnosis not present

## 2020-11-14 DIAGNOSIS — R262 Difficulty in walking, not elsewhere classified: Secondary | ICD-10-CM | POA: Diagnosis not present

## 2020-11-14 DIAGNOSIS — F17210 Nicotine dependence, cigarettes, uncomplicated: Secondary | ICD-10-CM | POA: Diagnosis not present

## 2020-11-14 DIAGNOSIS — I6523 Occlusion and stenosis of bilateral carotid arteries: Secondary | ICD-10-CM | POA: Diagnosis not present

## 2020-11-14 DIAGNOSIS — Z7901 Long term (current) use of anticoagulants: Secondary | ICD-10-CM | POA: Diagnosis not present

## 2020-11-14 DIAGNOSIS — I63511 Cerebral infarction due to unspecified occlusion or stenosis of right middle cerebral artery: Secondary | ICD-10-CM | POA: Diagnosis not present

## 2020-11-14 NOTE — Patient Outreach (Signed)
[facility]) Care Management  [place]  11/14/2020   Christopher Lang 06/04/1955 [id number]   RED ON EMMI ALERT - Stroke Day # 1 Date: 10/23 Red Alert Reason: Questions/problems with medications   Outreach attempt #2, successful.  Identity verified.  This care manager introduced self and stated purpose of call.  [facility] care management services explained.  Wife also present during call.  Social: Lives with wife, she report member does not always listen to recommendations of using walker and asking for assistance, increasing risk of fall.  Education provided for safety and decreased fall risk, he verbalizes understanding.    Conditions: Per chart, history of stroke, A-fib, HTN, CHF, COPD, HLD, and anemia.  Had carotid stent placed during hospitalization, will follow up with IR.  Wife remains concerned about member's swallowing, per [facility], they will request ST sessions.  Already active with PT and OT.  Medications: Reviewed with member and wife.  Taking as instructed.  Did have question about using nicotine patches as well as taking Chantix, advised by provider that it's ok to to both.  Denies having a drink or cigarette since having stroke.  Appointments: Has now PCP, was seen and established with Dr. Nemiah Commander on yesterday.  Does not need transportation assistance to appointments. Will see cardiology on 11/3, neurology on 12/15, and IR will call to schedule.  Advance Directives: Does not have paperwork, wife report she and his sister will be responsible for decision in the event member is unable to speak  Denies needing paperwork.  Consent: Agrees to [facility] involvement.  Encounter Medications:  Outpatient Encounter Medications as of 11/14/2020  Medication Sig   apixaban (ELIQUIS) 5 MG TABS tablet Take 1 tablet (5 mg total) by mouth 2 (two) times daily.   atorvastatin (LIPITOR) 40 MG tablet Take 1 tablet (40 mg total) by mouth daily at 6 PM.   cholecalciferol  (VITAMIN D) 1000 units tablet Take 1,000 Units by mouth daily.   clopidogrel (PLAVIX) 75 MG tablet Take 1 tablet (75 mg total) by mouth daily.   fish oil-omega-3 fatty acids 1000 MG capsule Take 1,000 mg by mouth daily.     metoprolol tartrate (LOPRESSOR) 25 MG tablet Take 0.5 tablets (12.5 mg total) by mouth 2 (two) times daily.   nicotine (NICODERM CQ - DOSED IN MG/24 HOURS) 14 mg/24hr patch Place 1 patch (14 mg total) onto the skin daily.   sacubitril-valsartan (ENTRESTO) 24-26 MG Take 1 tablet by mouth 2 (two) times daily.   spironolactone (ALDACTONE) 25 MG tablet Take 0.5 tablets (12.5 mg total) by mouth daily.   varenicline (CHANTIX) 0.5 MG tablet Take 1 tablet (0.5 mg total) by mouth 2 (two) times daily.   No facility-administered encounter medications on file as of 11/14/2020.    Functional Status:  No flowsheet data found.  Fall/Depression Screening: Fall Risk  11/14/2020 03/19/2017 09/03/2016  Falls in the past year? 0 No No  Number falls in past yr: 0 - -  Injury with Fall? 0 - -   PHQ 2/9 Scores 11/14/2020 06/23/2018 03/19/2017 09/03/2016  PHQ - 2 Score 0 0 0 0    Assessment:   Care Plan Care Plan : RNCM Plan of Care (Adult)  Updates made by [name], RN since 11/14/2020 12:00 AM     Problem: Knowledge deficit related to management of chronic conditions (HTN, CHF, and A-fib) evidenced by recent stroke   Priority: High     Long-Range Goal: Management of chronic conditions (  A-fib, CHF, HTN) and recovery from stroke evidenced by no readmissions   Start Date: 11/14/2020  Expected End Date: 05/13/2021  Priority: High  Note:   Current Barriers:  Knowledge Deficits related to plan of care for management of Atrial Fibrillation, CHF, HTN, and Stroke Chronic Disease Management support and education needs related to Atrial Fibrillation, CHF, HTN, and Stroke  RNCM Clinical Goal(s):  Patient will verbalize understanding of plan for management of Atrial Fibrillation, CHF, HTN,  and stroke take all medications exactly as prescribed and will call provider for medication related questions attend all scheduled medical appointments: Neurology, cardiology & IR work with [facility] to increase strength and independence (has PT, OT and will have ST for concern for swallowing) not experience hospital admission. Hospital Admissions in last 6 months = 6  through collaboration with RN Care manager, provider, and care team.   Interventions:  Inter-disciplinary care team collaboration (see longitudinal plan of care) Evaluation of current treatment plan related to  self management and patient's adherence to plan as established by provider   Hypertension Interventions: Last practice recorded BP readings:  BP Readings from Last 3 Encounters:  11/08/20 130/90  12/12/19 118/82  08/15/19 128/68  Most recent eGFR/CrCl: No results found for: EGFR  No components found for: CRCL  Evaluation of current treatment plan related to hypertension self management and patient's adherence to plan as established by provider; Provided education to patient re: stroke prevention, s/s of heart attack and stroke; Reviewed medications with patient and discussed importance of compliance; Advised patient, providing education and rationale, to monitor blood pressure daily and record, calling PCP for findings outside established parameters;   Heart Failure Interventions: Provided education on low sodium diet; Reviewed Heart Failure Action Plan in depth and provided written copy; Assessed need for readable accurate scales in home; Provided education about placing scale on hard, flat surface; Discussed importance of daily weight and advised patient to weigh and record daily; Reviewed role of diuretics in prevention of fluid overload and management of heart failure; Discussed the importance of keeping all appointments with provider;  10/27 - Report weight today is 188.1 pounds, wife state this has been  stable.  AFIB Interventions:    Counseled on increased risk of stroke due to Afib and benefits of anticoagulation for stroke prevention; Reviewed importance of adherence to anticoagulant exactly as prescribed; Counseled on bleeding risk associated with Eliquis and Plavis and importance of self-monitoring for signs/symptoms of bleeding;  Patient Goals/Self-Care Activities: Patient will self administer medications as prescribed Patient will attend all scheduled provider appointments Patient will continue to perform ADL's independently Patient will continue to perform IADL's independently  Follow Up Plan:  Telephone follow up appointment with care management team member scheduled for:  11/10         Plan:  Follow-up: Patient agrees to Care Plan and Follow-up. Follow-up in 2 week(s).  Will notify PCP of involvement and will send patient education regarding stroke recovery.  [name], [place], MSN [place] [phone]

## 2020-11-16 ENCOUNTER — Other Ambulatory Visit: Payer: Self-pay | Admitting: Cardiology

## 2020-11-16 DIAGNOSIS — E782 Mixed hyperlipidemia: Secondary | ICD-10-CM

## 2020-11-16 DIAGNOSIS — I10 Essential (primary) hypertension: Secondary | ICD-10-CM

## 2020-11-18 DIAGNOSIS — H409 Unspecified glaucoma: Secondary | ICD-10-CM | POA: Diagnosis not present

## 2020-11-18 DIAGNOSIS — R262 Difficulty in walking, not elsewhere classified: Secondary | ICD-10-CM | POA: Diagnosis not present

## 2020-11-18 DIAGNOSIS — Z7901 Long term (current) use of anticoagulants: Secondary | ICD-10-CM | POA: Diagnosis not present

## 2020-11-18 DIAGNOSIS — I10 Essential (primary) hypertension: Secondary | ICD-10-CM | POA: Diagnosis not present

## 2020-11-18 DIAGNOSIS — I6523 Occlusion and stenosis of bilateral carotid arteries: Secondary | ICD-10-CM | POA: Diagnosis not present

## 2020-11-18 DIAGNOSIS — I69354 Hemiplegia and hemiparesis following cerebral infarction affecting left non-dominant side: Secondary | ICD-10-CM | POA: Diagnosis not present

## 2020-11-18 DIAGNOSIS — G4733 Obstructive sleep apnea (adult) (pediatric): Secondary | ICD-10-CM | POA: Diagnosis not present

## 2020-11-18 DIAGNOSIS — Z95828 Presence of other vascular implants and grafts: Secondary | ICD-10-CM | POA: Diagnosis not present

## 2020-11-18 DIAGNOSIS — F17210 Nicotine dependence, cigarettes, uncomplicated: Secondary | ICD-10-CM | POA: Diagnosis not present

## 2020-11-18 DIAGNOSIS — J449 Chronic obstructive pulmonary disease, unspecified: Secondary | ICD-10-CM | POA: Diagnosis not present

## 2020-11-18 DIAGNOSIS — E785 Hyperlipidemia, unspecified: Secondary | ICD-10-CM | POA: Diagnosis not present

## 2020-11-18 DIAGNOSIS — M6281 Muscle weakness (generalized): Secondary | ICD-10-CM | POA: Diagnosis not present

## 2020-11-18 DIAGNOSIS — I429 Cardiomyopathy, unspecified: Secondary | ICD-10-CM | POA: Diagnosis not present

## 2020-11-18 DIAGNOSIS — I959 Hypotension, unspecified: Secondary | ICD-10-CM | POA: Diagnosis not present

## 2020-11-18 DIAGNOSIS — I63511 Cerebral infarction due to unspecified occlusion or stenosis of right middle cerebral artery: Secondary | ICD-10-CM | POA: Diagnosis not present

## 2020-11-18 DIAGNOSIS — Z7902 Long term (current) use of antithrombotics/antiplatelets: Secondary | ICD-10-CM | POA: Diagnosis not present

## 2020-11-18 DIAGNOSIS — I2109 ST elevation (STEMI) myocardial infarction involving other coronary artery of anterior wall: Secondary | ICD-10-CM | POA: Diagnosis not present

## 2020-11-19 NOTE — Progress Notes (Addendum)
Speak   Cardiology Office Note  Date: 11/20/2020   ID: Christopher Lang, DOB [birth year], MRN [id number]  PCP:  Neale Burly, MD  Cardiologist:  Rozann Lesches, MD Electrophysiologist:  None   Chief Complaint: Status post STEMI and CVA  History of Present Illness: Christopher Lang is a 65 y.o. male with a history of CVA, CAD, STEMI, COPD, HTN, HLD, OSA, history of atrial fibrillation.  Recent admission on 11/03/2020 with acute right MCA stroke due to right M1 occlusion status post TNKase and IR with TICI 3 revascularization, acute ST elevation MI.  He presented with sudden onset of left-sided weakness was nausea and dry heaving the prior day.  He got up the next morning early with some chest pain and feeling flushed.  By the time EMS arrived he developed left-sided weakness.  EKG per EMS with anterior STEMI.  He was found to have anterior STEMI and right MCA occlusion.  Status post STEMI troponins were greater than 24,000.  Echocardiogram was 20 to 25% EF with mid to distal septum/anterior segments akinetic.  Entire apex was akinetic.  Myocardium appeared thin.  No LV thrombus.  His diltiazem was discontinued along with aspirin, Pletal, and and Protonix.  He was started on Eliquis 5 mg p.o. twice daily, Plavix 75 mg daily, atorvastatin 40 mg daily, omega-3 fatty acids 1000 mg daily.  Lopressor 12.5 mg p.o. twice daily, Entresto 24/26 mg p.o. twice daily, Aldactone 12.5 mg p.o. daily.  He presents today for follow-up status post CVA and STEMI.  He is doing well without any anginal or exertional symptoms.  No obvious focal neurologic deficits since CVA.  He states he feels a little weak and he is using a walker to ambulate but is doing well.  He states he is receiving speech therapy, occupational therapy and physical therapy at home.  He states while he was in the hospital they ballooned my left carotid artery and stented it.  He states the left side needs to be worked on.  Blood pressures well  controlled today.  He states he has not smoked since discharge from hospital.  He has Nicotrol patches and is taking Chantix.  He is tolerating all of his medications well.  He has an upcoming visit with Dr. Leonie Man at [facility] on the December 15.   Past Medical History:  Diagnosis Date   Acute CVA (cerebrovascular accident) ([place]) 11/03/2020   Acute left-sided weakness 11/03/2020   Acute ST elevation myocardial infarction (STEMI) ([place]) 11/03/2020   Cataract    Removed Dec 2017/Jan 2018   COPD (chronic obstructive pulmonary disease) ([place])    Essential hypertension    H. pylori infection 11/2016   With documented eradication via biopsy Feb 2019   History of atrial fibrillation    November 2017   Hyperlipidemia    Sleep apnea    Did not tolerate CPAP   Tubulovillous adenoma of rectum 04/2010    Past Surgical History:  Procedure Laterality Date   CATARACT EXTRACTION W/PHACO Right 01/16/2016   Procedure: CATARACT EXTRACTION PHACO AND INTRAOCULAR LENS PLACEMENT RIGHT EYE CDE= 6.97;  Surgeon: Tonny Branch, MD;  Location: [facility];  Service: Ophthalmology;  Laterality: Right;  right   CATARACT EXTRACTION W/PHACO Left 01/30/2016   Procedure: CATARACT EXTRACTION PHACO AND INTRAOCULAR LENS PLACEMENT LEFT EYE CDE 3.13;  Surgeon: Tonny Branch, MD;  Location: [facility];  Service: Ophthalmology;  Laterality: Left;  left   COLONOSCOPY  2012   COLONOSCOPY N/A 12/04/2016  three 4-6 mm tubular adenomas, moderate size external and internal hemorrhoids, redundant left colon, 3 year surveillance   ESOPHAGOGASTRODUODENOSCOPY N/A 12/04/2016   Dr. Oneida Alar: normal esophagus, small hiatal hernia, chronic gastritis with H.pylori, peptic duodenitis, atypical lymphoid proliferation. Surveillance EGD Feb 2019 without abnormal cells, negative H.pylori   ESOPHAGOGASTRODUODENOSCOPY N/A 03/15/2017   mild gastritis, no atypical cells, negative H.pylori   EXCISION, SKI  08/2016   R THIGH   IR ANGIO INTRA  EXTRACRAN SEL COM CAROTID INNOMINATE BILAT MOD SED  11/06/2020   IR CT HEAD LTD  11/03/2020   IR INTRAVSC STENT CERV CAROTID W/EMB-PROT MOD SED INCL ANGIO  11/06/2020   IR PERCUTANEOUS ART THROMBECTOMY/INFUSION INTRACRANIAL INC DIAG ANGIO  11/03/2020   IR PTA INTRACRANIAL  11/04/2020   IR US GUIDE VASC ACCESS LEFT  11/03/2020   IR US GUIDE VASC ACCESS RIGHT  11/03/2020   IR US GUIDE VASC ACCESS RIGHT  11/06/2020   RADIOLOGY WITH ANESTHESIA N/A 11/03/2020   Procedure: RADIOLOGY WITH ANESTHESIA;  Surgeon: Radiologist, Medication, MD;  Location: [facility];  Service: Radiology;  Laterality: N/A;   RADIOLOGY WITH ANESTHESIA N/A 11/06/2020   Procedure: STENTING;  Surgeon: Corrie Mckusick, DO;  Location: [facility];  Service: Anesthesiology;  Laterality: N/A;    Current Outpatient Medications  Medication Sig Dispense Refill   apixaban (ELIQUIS) 5 MG TABS tablet Take 1 tablet (5 mg total) by mouth 2 (two) times daily. 60 tablet 0   atorvastatin (LIPITOR) 40 MG tablet Take 1 tablet (40 mg total) by mouth daily at 6 PM. 60 tablet 0   cholecalciferol (VITAMIN D) 1000 units tablet Take 1,000 Units by mouth daily.     clopidogrel (PLAVIX) 75 MG tablet Take 1 tablet (75 mg total) by mouth daily. 60 tablet 0   diltiazem (CARDIZEM CD) 240 MG 24 hr capsule TAKE 1 CAPSULE BY MOUTH EVERY DAY 90 capsule 1   fish oil-omega-3 fatty acids 1000 MG capsule Take 1,000 mg by mouth daily.       metoprolol tartrate (LOPRESSOR) 25 MG tablet Take 0.5 tablets (12.5 mg total) by mouth 2 (two) times daily. 60 tablet 0   nicotine (NICODERM CQ - DOSED IN MG/24 HOURS) 14 mg/24hr patch Place 1 patch (14 mg total) onto the skin daily. 28 patch 0   sacubitril-valsartan (ENTRESTO) 24-26 MG Take 1 tablet by mouth 2 (two) times daily. 60 tablet 0   spironolactone (ALDACTONE) 25 MG tablet Take 0.5 tablets (12.5 mg total) by mouth daily. 60 tablet 0   varenicline (CHANTIX) 0.5 MG tablet Take 1 tablet (0.5 mg total) by mouth 2 (two) times daily.  240 tablet 0   No current facility-administered medications for this visit.   Allergies:  Patient has no known allergies.   Social History: The patient  reports that he has been smoking cigarettes. He has a 45.00 pack-year smoking history. He has never used smokeless tobacco. He reports current alcohol use. He reports that he does not use drugs.   Family History: The patient's family history includes Arthritis in his sister, sister, and sister; CAD in his mother; Cancer in his brother and brother; Deep vein thrombosis in his brother, brother, and sister; Diabetes in his sister and sister; Early death in his father; Heart attack in his brother, mother, sister, sister, and sister; High blood pressure in his mother; Hypertension in his daughter, sister, and sister; Kidney disease in his brother; Leukemia in his father; Seizures in his brother; Stroke in his sister and sister.  ROS:  Please see the history of present illness. Otherwise, complete review of systems is positive for none.  All other systems are reviewed and negative.   Physical Exam: VS:  There were no vitals taken for this visit., BMI There is no height or weight on file to calculate BMI.  Wt Readings from Last 3 Encounters:  11/06/20 205 lb (93 kg)  12/12/19 207 lb (93.9 kg)  08/15/19 199 lb (90.3 kg)    General: Patient appears comfortable at rest. Neck: Supple, no elevated JVP or carotid bruits, no thyromegaly. Lungs: Clear to auscultation, nonlabored breathing at rest. Cardiac: Regular rate and rhythm, no S3 or significant systolic murmur, no pericardial rub. Extremities: No pitting edema, distal pulses 2+. Skin: Warm and dry. Musculoskeletal: No kyphosis. Neuropsychiatric: Alert and oriented x3, affect grossly appropriate.  ECG:  EKG on October 17 demonstrated normal sinus rhythm rate of 66.  Anteroseptal infarct, possibly acute.  Marked T wave abnormalities consider inferolateral ST ischemia/acute MI/STEMI.  He had  significant ST elevations in V2 and V3 and marked T wave inversion in V3, V4, V5 as well as leads I and II.  Recent Labwork: 11/03/2020: ALT 30; AST 83 11/08/2020: BUN 11; Creatinine, Ser 0.82; Hemoglobin 12.4; Platelets 137; Potassium 3.1; Sodium 135     Component Value Date/Time   CHOL 159 11/04/2020 0614   CHOL 160 06/23/2018 1445   TRIG 187 (H) 11/04/2020 0614   HDL 31 (L) 11/04/2020 0614   HDL 29 (L) 06/23/2018 1445   CHOLHDL 5.1 11/04/2020 0614   VLDL 37 11/04/2020 0614   LDLCALC 91 11/04/2020 0614   LDLCALC 77 08/15/2019 1002    Other Studies Reviewed Today:   US guided access right common femoral artery and right carotid artery stent with embolic  INDICATION: 65 year old male with a history of symptomatic high-grade right carotid stenosis, high risk for surgery given cardiac issues   EXAM: ULTRASOUND-GUIDED ACCESS RIGHT COMMON FEMORAL ARTERY   RIGHT CAROTID STENT WITH EMBOLIC PROTECTION  IMPRESSION: Status post ultrasound guided access right common femoral artery for right cervical ICA stenting with embolic protection, for treatment of high-grade symptomatic stenosis, high risk for surgery.   Angiogram demonstrates a left cervical ICA critical stenosis.    MRI Brain 11/04/2020 IMPRESSION: 1. Patchy acute cerebral infarcts on the right more than left with right-sided petechial hemorrhage. 2. No residual or recurrent right MCA thrombus. 3. No detected flow in the intracranial left ICA but well compensated by the left posterior communicating artery. Chronic critical stenosis with underfilling of the left ICA bulb    Carotid artery duplex study 11/06/2020 Summary:  Right Carotid: Velocities in the right ICA are consistent with a 80-99%  stenosis. The ECA appears >50% stenosed.   Left Carotid: The ECA appears >50% stenosed. Unable to evaluate proximal  ICA  secondary to severe calcification/acoustic shadowing.  Recommend : correlate with CT angiogram.   Vertebrals:  Bilateral vertebral arteries demonstrate antegrade flow.  Subclavians: Normal flow hemodynamics were seen in bilateral subclavian  arteries.   *See table(s) above for measurements and observations.     Echocardiograms 11/03/2020 1. The mid to distal septum/anterior segments are akinetic. The entire  apex is akinetic. The myocardium appears thin. Suspect these findings  represent a large wrap around LAD infarction (troponin >24,000). No LV  thrombus on contrast imaging. There is  severe LV dysfunction, EF 20-25%. Left ventricular ejection fraction, by  estimation, is 20 to 25%. The left ventricle has severely decreased  function. The  left ventricle demonstrates regional wall motion  abnormalities (see scoring diagram/findings for  description). There is mild left ventricular hypertrophy. Left ventricular  diastolic parameters are consistent with Grade I diastolic dysfunction  (impaired relaxation).   2. Right ventricular systolic function is normal. The right ventricular  size is normal. Tricuspid regurgitation signal is inadequate for assessing  PA pressure.   3. The mitral valve is grossly normal. Trivial mitral valve  regurgitation. No evidence of mitral stenosis.   4. The aortic valve is tricuspid. There is moderate calcification of the  aortic valve. Aortic valve regurgitation is not visualized. Mild to  moderate aortic valve sclerosis/calcification is present, without any  evidence of aortic stenosis.   5. The inferior vena cava is normal in size with <50% respiratory  variability, suggesting right atrial pressure of 8 mmHg.   Comparison(s): Changes from prior study are noted. LVEF now severely  reduced 20-25%. WMA suggest LAD infarction.   Conclusion(s)/Recommendation(s): Findings consistent with ischemic  cardiomyopathy.     Echocardiogram 06/07/2019:  1. Left ventricular ejection fraction, by estimation, is 60 to 65%. The  left ventricle has normal  function. The left ventricle has no regional  wall motion abnormalities. Left ventricular diastolic parameters were  normal.   2. Right ventricular systolic function is normal. The right ventricular  size is normal. There is normal pulmonary artery systolic pressure.   3. The mitral valve is degenerative. Trivial mitral valve regurgitation.   4. On visual inspection, there appears to be at least mild calcific  aortic stenosis (albeit hemodynamics do not support this). The aortic  valve is tricuspid. Aortic valve regurgitation is not visualized. Aortic  valve area, by VTI measures 2.21 cm.  Aortic valve mean gradient measures 6.0 mmHg. Aortic valve Vmax measures  1.68 m/s.   5. The inferior vena cava is normal in size with greater than 50%  respiratory variability, suggesting right atrial pressure of 3 mmHg.   Comparison(s): Echocardiogram done 12/03/15 showed an EF of 65%.    Assessment and Plan:  1. Acute CVA (cerebrovascular accident) ([place])   2. Acute ST elevation myocardial infarction (STEMI) involving left anterior descending (LAD) coronary artery (HCC)   3. Stenosis of carotid artery, unspecified laterality   4. Essential hypertension   5. Mixed hyperlipidemia   6. Tobacco abuse    1. Acute CVA (cerebrovascular accident) ([place]) Right MCA infarct due to right M1 Occlusion s/p TNK and IR with.  He had intervention to right carotid artery with ballooning and stenting.  He states he needs to have the same done to his left side.  He will be seeing [facility] neurology on December 15 for follow-up.  2. Acute ST elevation myocardial infarction (STEMI) involving left anterior descending (LAD) coronary artery (HCC)/ Ischemic cardiomyopathy Status post anterolateral MI/ STEMI with trop > 24000. Echocardiogram EF 20-25%. The mid to distal septum / anterior segments were akinetic. Entire apex was akinetic. Continuing Eliquis and Plavix. Metoprolol decreased to 12.5 mg po bid. Continuing  Entresto 24/26/mg po bid and spironolactone 12.5 mg po daily.  He denies any current anginal symptoms or nitroglycerin use.  He is tolerating all of his medications well.  No bleeding on Eliquis or Plavix.  We will get a follow-up basic metabolic panel and magnesium in 2 weeks.  If electrolytes and renal function look good we will uptitrate Entresto and/or spironolactone.  Will need a follow-up echocardiogram in approximately 3 months.  3. Stenosis of carotid artery, unspecified laterality  CTA head and  neck : Severe bilateral ICA origin stenosis with underfilling on the left. MRI : no detected flow in the intracranial left ICA but well compensated by left posterior communicating artery. Chronic critical stenosis with underfilling of the left ICA bulb. Carotid duplex :  Right ICA 80-99% stenosis. Left hard to measure due to shadowing. Status post RICA stenting November 06, 2020 Dr Jacqualyn Posey. Plan was for LICA revascularization at a later date.  He has no focal neurological deficits today.  4. Essential hypertension Continue Lopressor 12.5 mg p.o. twice daily.  Continue Entresto 24/26 mg p.o. twice daily.  Continue spironolactone 12.5 mg p.o. daily  5. Mixed hyperlipidemia LDL was 77 goal of 70. Atorvastatin increased to 40 mg po daily.  Blood pressure is well controlled today at 130/80.  6. Tobacco abuse He states he has not smoked since discharge from hospital and does not have a craving for cigarettes.  He has been using Nicotrol patches and currently on Chantix.  Medication Adjustments/Labs and Tests Ordered: Current medicines are reviewed at length with the patient today.  Concerns regarding medicines are outlined above.   Disposition: Follow-up with Dr. Domenic Polite or APP 3 months  Signed, Levell July, NP 11/20/2020 7:05 PM    [facility] [place], [place], [place] [address] Phone: [phone]; Fax: [phone]

## 2020-11-20 DIAGNOSIS — I6523 Occlusion and stenosis of bilateral carotid arteries: Secondary | ICD-10-CM | POA: Diagnosis not present

## 2020-11-20 DIAGNOSIS — E785 Hyperlipidemia, unspecified: Secondary | ICD-10-CM | POA: Diagnosis not present

## 2020-11-20 DIAGNOSIS — M6281 Muscle weakness (generalized): Secondary | ICD-10-CM | POA: Diagnosis not present

## 2020-11-20 DIAGNOSIS — I429 Cardiomyopathy, unspecified: Secondary | ICD-10-CM | POA: Diagnosis not present

## 2020-11-20 DIAGNOSIS — J449 Chronic obstructive pulmonary disease, unspecified: Secondary | ICD-10-CM | POA: Diagnosis not present

## 2020-11-20 DIAGNOSIS — I959 Hypotension, unspecified: Secondary | ICD-10-CM | POA: Diagnosis not present

## 2020-11-20 DIAGNOSIS — R262 Difficulty in walking, not elsewhere classified: Secondary | ICD-10-CM | POA: Diagnosis not present

## 2020-11-20 DIAGNOSIS — I10 Essential (primary) hypertension: Secondary | ICD-10-CM | POA: Diagnosis not present

## 2020-11-20 DIAGNOSIS — Z95828 Presence of other vascular implants and grafts: Secondary | ICD-10-CM | POA: Diagnosis not present

## 2020-11-20 DIAGNOSIS — I69354 Hemiplegia and hemiparesis following cerebral infarction affecting left non-dominant side: Secondary | ICD-10-CM | POA: Diagnosis not present

## 2020-11-20 DIAGNOSIS — F17210 Nicotine dependence, cigarettes, uncomplicated: Secondary | ICD-10-CM | POA: Diagnosis not present

## 2020-11-20 DIAGNOSIS — Z7902 Long term (current) use of antithrombotics/antiplatelets: Secondary | ICD-10-CM | POA: Diagnosis not present

## 2020-11-20 DIAGNOSIS — I63511 Cerebral infarction due to unspecified occlusion or stenosis of right middle cerebral artery: Secondary | ICD-10-CM | POA: Diagnosis not present

## 2020-11-20 DIAGNOSIS — I2109 ST elevation (STEMI) myocardial infarction involving other coronary artery of anterior wall: Secondary | ICD-10-CM | POA: Diagnosis not present

## 2020-11-20 DIAGNOSIS — G4733 Obstructive sleep apnea (adult) (pediatric): Secondary | ICD-10-CM | POA: Diagnosis not present

## 2020-11-20 DIAGNOSIS — Z7901 Long term (current) use of anticoagulants: Secondary | ICD-10-CM | POA: Diagnosis not present

## 2020-11-20 DIAGNOSIS — H409 Unspecified glaucoma: Secondary | ICD-10-CM | POA: Diagnosis not present

## 2020-11-21 ENCOUNTER — Encounter: Payer: Self-pay | Admitting: Family Medicine

## 2020-11-21 ENCOUNTER — Ambulatory Visit: Payer: Medicare Other | Admitting: Family Medicine

## 2020-11-21 ENCOUNTER — Telehealth: Payer: Self-pay | Admitting: Cardiology

## 2020-11-21 VITALS — BP 130/80 | HR 58 | Ht 72.0 in | Wt 191.0 lb

## 2020-11-21 DIAGNOSIS — E782 Mixed hyperlipidemia: Secondary | ICD-10-CM

## 2020-11-21 DIAGNOSIS — I6529 Occlusion and stenosis of unspecified carotid artery: Secondary | ICD-10-CM

## 2020-11-21 DIAGNOSIS — I10 Essential (primary) hypertension: Secondary | ICD-10-CM | POA: Diagnosis not present

## 2020-11-21 DIAGNOSIS — I639 Cerebral infarction, unspecified: Secondary | ICD-10-CM | POA: Diagnosis not present

## 2020-11-21 DIAGNOSIS — Z79899 Other long term (current) drug therapy: Secondary | ICD-10-CM | POA: Diagnosis not present

## 2020-11-21 DIAGNOSIS — I350 Nonrheumatic aortic (valve) stenosis: Secondary | ICD-10-CM

## 2020-11-21 DIAGNOSIS — Z720 Tobacco use: Secondary | ICD-10-CM

## 2020-11-21 DIAGNOSIS — I2102 ST elevation (STEMI) myocardial infarction involving left anterior descending coronary artery: Secondary | ICD-10-CM

## 2020-11-21 NOTE — Telephone Encounter (Signed)
Patient states he received a call shortly after his appointment today, but he does not know who called or what it was regarding. Please advise.

## 2020-11-21 NOTE — Patient Instructions (Addendum)
Medication Instructions:  Your physician recommends that you continue on your current medications as directed. Please refer to the Current Medication list given to you today.  Labwork: BMET & Mg in 2 weeks (12/05/2020) at [facility] or [facility] [place] 8:00 am - 4:00 pm Non-fasting No appointment needed  Testing/Procedures: Your physician has requested that you have an echocardiogram in 3 months. Echocardiography is a painless test that uses sound waves to create images of your heart. It provides your doctor with information about the size and shape of your heart and how well your heart's chambers and valves are working. This procedure takes approximately one hour. There are no restrictions for this procedure.  Follow-Up: Your physician recommends that you schedule a follow-up appointment in: 6 months  Any Other Special Instructions Will Be Listed Below (If Applicable).  If you need a refill on your cardiac medications before your next appointment, please call your pharmacy.

## 2020-11-21 NOTE — Telephone Encounter (Signed)
Advised that echocardiogram is needed in 3 months and he would be contacted with an appointment. Verbalized understanding.

## 2020-11-21 NOTE — Addendum Note (Signed)
Addended by: Merlene Laughter on: 11/21/2020 11:07 AM   Modules accepted: Orders

## 2020-11-22 DIAGNOSIS — I10 Essential (primary) hypertension: Secondary | ICD-10-CM | POA: Diagnosis not present

## 2020-11-22 DIAGNOSIS — I6523 Occlusion and stenosis of bilateral carotid arteries: Secondary | ICD-10-CM | POA: Diagnosis not present

## 2020-11-22 DIAGNOSIS — G4733 Obstructive sleep apnea (adult) (pediatric): Secondary | ICD-10-CM | POA: Diagnosis not present

## 2020-11-22 DIAGNOSIS — I2109 ST elevation (STEMI) myocardial infarction involving other coronary artery of anterior wall: Secondary | ICD-10-CM | POA: Diagnosis not present

## 2020-11-22 DIAGNOSIS — Z95828 Presence of other vascular implants and grafts: Secondary | ICD-10-CM | POA: Diagnosis not present

## 2020-11-22 DIAGNOSIS — M6281 Muscle weakness (generalized): Secondary | ICD-10-CM | POA: Diagnosis not present

## 2020-11-22 DIAGNOSIS — Z7902 Long term (current) use of antithrombotics/antiplatelets: Secondary | ICD-10-CM | POA: Diagnosis not present

## 2020-11-22 DIAGNOSIS — E785 Hyperlipidemia, unspecified: Secondary | ICD-10-CM | POA: Diagnosis not present

## 2020-11-22 DIAGNOSIS — I63511 Cerebral infarction due to unspecified occlusion or stenosis of right middle cerebral artery: Secondary | ICD-10-CM | POA: Diagnosis not present

## 2020-11-22 DIAGNOSIS — R262 Difficulty in walking, not elsewhere classified: Secondary | ICD-10-CM | POA: Diagnosis not present

## 2020-11-22 DIAGNOSIS — I69354 Hemiplegia and hemiparesis following cerebral infarction affecting left non-dominant side: Secondary | ICD-10-CM | POA: Diagnosis not present

## 2020-11-22 DIAGNOSIS — F17210 Nicotine dependence, cigarettes, uncomplicated: Secondary | ICD-10-CM | POA: Diagnosis not present

## 2020-11-22 DIAGNOSIS — H409 Unspecified glaucoma: Secondary | ICD-10-CM | POA: Diagnosis not present

## 2020-11-22 DIAGNOSIS — Z7901 Long term (current) use of anticoagulants: Secondary | ICD-10-CM | POA: Diagnosis not present

## 2020-11-22 DIAGNOSIS — I429 Cardiomyopathy, unspecified: Secondary | ICD-10-CM | POA: Diagnosis not present

## 2020-11-22 DIAGNOSIS — I959 Hypotension, unspecified: Secondary | ICD-10-CM | POA: Diagnosis not present

## 2020-11-22 DIAGNOSIS — J449 Chronic obstructive pulmonary disease, unspecified: Secondary | ICD-10-CM | POA: Diagnosis not present

## 2020-11-25 DIAGNOSIS — M6281 Muscle weakness (generalized): Secondary | ICD-10-CM | POA: Diagnosis not present

## 2020-11-25 DIAGNOSIS — F17210 Nicotine dependence, cigarettes, uncomplicated: Secondary | ICD-10-CM | POA: Diagnosis not present

## 2020-11-25 DIAGNOSIS — J449 Chronic obstructive pulmonary disease, unspecified: Secondary | ICD-10-CM | POA: Diagnosis not present

## 2020-11-25 DIAGNOSIS — E785 Hyperlipidemia, unspecified: Secondary | ICD-10-CM | POA: Diagnosis not present

## 2020-11-25 DIAGNOSIS — R262 Difficulty in walking, not elsewhere classified: Secondary | ICD-10-CM | POA: Diagnosis not present

## 2020-11-25 DIAGNOSIS — I63511 Cerebral infarction due to unspecified occlusion or stenosis of right middle cerebral artery: Secondary | ICD-10-CM | POA: Diagnosis not present

## 2020-11-25 DIAGNOSIS — I2109 ST elevation (STEMI) myocardial infarction involving other coronary artery of anterior wall: Secondary | ICD-10-CM | POA: Diagnosis not present

## 2020-11-25 DIAGNOSIS — I69354 Hemiplegia and hemiparesis following cerebral infarction affecting left non-dominant side: Secondary | ICD-10-CM | POA: Diagnosis not present

## 2020-11-25 DIAGNOSIS — H409 Unspecified glaucoma: Secondary | ICD-10-CM | POA: Diagnosis not present

## 2020-11-25 DIAGNOSIS — G4733 Obstructive sleep apnea (adult) (pediatric): Secondary | ICD-10-CM | POA: Diagnosis not present

## 2020-11-25 DIAGNOSIS — Z7901 Long term (current) use of anticoagulants: Secondary | ICD-10-CM | POA: Diagnosis not present

## 2020-11-25 DIAGNOSIS — I6523 Occlusion and stenosis of bilateral carotid arteries: Secondary | ICD-10-CM | POA: Diagnosis not present

## 2020-11-25 DIAGNOSIS — Z7902 Long term (current) use of antithrombotics/antiplatelets: Secondary | ICD-10-CM | POA: Diagnosis not present

## 2020-11-25 DIAGNOSIS — Z95828 Presence of other vascular implants and grafts: Secondary | ICD-10-CM | POA: Diagnosis not present

## 2020-11-25 DIAGNOSIS — I429 Cardiomyopathy, unspecified: Secondary | ICD-10-CM | POA: Diagnosis not present

## 2020-11-25 DIAGNOSIS — I959 Hypotension, unspecified: Secondary | ICD-10-CM | POA: Diagnosis not present

## 2020-11-25 DIAGNOSIS — I10 Essential (primary) hypertension: Secondary | ICD-10-CM | POA: Diagnosis not present

## 2020-11-26 ENCOUNTER — Ambulatory Visit (INDEPENDENT_AMBULATORY_CARE_PROVIDER_SITE_OTHER): Payer: Medicare Other

## 2020-11-26 ENCOUNTER — Other Ambulatory Visit: Payer: Self-pay

## 2020-11-26 DIAGNOSIS — I10 Essential (primary) hypertension: Secondary | ICD-10-CM | POA: Diagnosis not present

## 2020-11-26 DIAGNOSIS — F17210 Nicotine dependence, cigarettes, uncomplicated: Secondary | ICD-10-CM

## 2020-11-26 DIAGNOSIS — I69354 Hemiplegia and hemiparesis following cerebral infarction affecting left non-dominant side: Secondary | ICD-10-CM

## 2020-11-26 DIAGNOSIS — I959 Hypotension, unspecified: Secondary | ICD-10-CM

## 2020-11-26 DIAGNOSIS — Z7901 Long term (current) use of anticoagulants: Secondary | ICD-10-CM | POA: Diagnosis not present

## 2020-11-26 DIAGNOSIS — E785 Hyperlipidemia, unspecified: Secondary | ICD-10-CM | POA: Diagnosis not present

## 2020-11-26 DIAGNOSIS — G4733 Obstructive sleep apnea (adult) (pediatric): Secondary | ICD-10-CM

## 2020-11-26 DIAGNOSIS — I2109 ST elevation (STEMI) myocardial infarction involving other coronary artery of anterior wall: Secondary | ICD-10-CM

## 2020-11-26 DIAGNOSIS — I429 Cardiomyopathy, unspecified: Secondary | ICD-10-CM

## 2020-11-26 DIAGNOSIS — H409 Unspecified glaucoma: Secondary | ICD-10-CM

## 2020-11-26 DIAGNOSIS — J449 Chronic obstructive pulmonary disease, unspecified: Secondary | ICD-10-CM | POA: Diagnosis not present

## 2020-11-26 DIAGNOSIS — I6523 Occlusion and stenosis of bilateral carotid arteries: Secondary | ICD-10-CM

## 2020-11-27 DIAGNOSIS — I69354 Hemiplegia and hemiparesis following cerebral infarction affecting left non-dominant side: Secondary | ICD-10-CM | POA: Diagnosis not present

## 2020-11-27 DIAGNOSIS — I959 Hypotension, unspecified: Secondary | ICD-10-CM | POA: Diagnosis not present

## 2020-11-27 DIAGNOSIS — I6523 Occlusion and stenosis of bilateral carotid arteries: Secondary | ICD-10-CM | POA: Diagnosis not present

## 2020-11-27 DIAGNOSIS — M6281 Muscle weakness (generalized): Secondary | ICD-10-CM | POA: Diagnosis not present

## 2020-11-27 DIAGNOSIS — I63511 Cerebral infarction due to unspecified occlusion or stenosis of right middle cerebral artery: Secondary | ICD-10-CM | POA: Diagnosis not present

## 2020-11-27 DIAGNOSIS — R262 Difficulty in walking, not elsewhere classified: Secondary | ICD-10-CM | POA: Diagnosis not present

## 2020-11-27 DIAGNOSIS — I2109 ST elevation (STEMI) myocardial infarction involving other coronary artery of anterior wall: Secondary | ICD-10-CM | POA: Diagnosis not present

## 2020-11-27 DIAGNOSIS — F17210 Nicotine dependence, cigarettes, uncomplicated: Secondary | ICD-10-CM | POA: Diagnosis not present

## 2020-11-27 DIAGNOSIS — H409 Unspecified glaucoma: Secondary | ICD-10-CM | POA: Diagnosis not present

## 2020-11-27 DIAGNOSIS — Z95828 Presence of other vascular implants and grafts: Secondary | ICD-10-CM | POA: Diagnosis not present

## 2020-11-27 DIAGNOSIS — I10 Essential (primary) hypertension: Secondary | ICD-10-CM | POA: Diagnosis not present

## 2020-11-27 DIAGNOSIS — I429 Cardiomyopathy, unspecified: Secondary | ICD-10-CM | POA: Diagnosis not present

## 2020-11-27 DIAGNOSIS — Z7901 Long term (current) use of anticoagulants: Secondary | ICD-10-CM | POA: Diagnosis not present

## 2020-11-27 DIAGNOSIS — J449 Chronic obstructive pulmonary disease, unspecified: Secondary | ICD-10-CM | POA: Diagnosis not present

## 2020-11-27 DIAGNOSIS — Z7902 Long term (current) use of antithrombotics/antiplatelets: Secondary | ICD-10-CM | POA: Diagnosis not present

## 2020-11-27 DIAGNOSIS — E785 Hyperlipidemia, unspecified: Secondary | ICD-10-CM | POA: Diagnosis not present

## 2020-11-27 DIAGNOSIS — G4733 Obstructive sleep apnea (adult) (pediatric): Secondary | ICD-10-CM | POA: Diagnosis not present

## 2020-11-28 ENCOUNTER — Other Ambulatory Visit: Payer: Self-pay | Admitting: *Deleted

## 2020-11-28 DIAGNOSIS — G4733 Obstructive sleep apnea (adult) (pediatric): Secondary | ICD-10-CM | POA: Diagnosis not present

## 2020-11-28 DIAGNOSIS — Z7902 Long term (current) use of antithrombotics/antiplatelets: Secondary | ICD-10-CM | POA: Diagnosis not present

## 2020-11-28 DIAGNOSIS — I429 Cardiomyopathy, unspecified: Secondary | ICD-10-CM | POA: Diagnosis not present

## 2020-11-28 DIAGNOSIS — I2109 ST elevation (STEMI) myocardial infarction involving other coronary artery of anterior wall: Secondary | ICD-10-CM | POA: Diagnosis not present

## 2020-11-28 DIAGNOSIS — R262 Difficulty in walking, not elsewhere classified: Secondary | ICD-10-CM | POA: Diagnosis not present

## 2020-11-28 DIAGNOSIS — I63511 Cerebral infarction due to unspecified occlusion or stenosis of right middle cerebral artery: Secondary | ICD-10-CM | POA: Diagnosis not present

## 2020-11-28 DIAGNOSIS — Z95828 Presence of other vascular implants and grafts: Secondary | ICD-10-CM | POA: Diagnosis not present

## 2020-11-28 DIAGNOSIS — I69354 Hemiplegia and hemiparesis following cerebral infarction affecting left non-dominant side: Secondary | ICD-10-CM | POA: Diagnosis not present

## 2020-11-28 DIAGNOSIS — H409 Unspecified glaucoma: Secondary | ICD-10-CM | POA: Diagnosis not present

## 2020-11-28 DIAGNOSIS — E785 Hyperlipidemia, unspecified: Secondary | ICD-10-CM | POA: Diagnosis not present

## 2020-11-28 DIAGNOSIS — Z7901 Long term (current) use of anticoagulants: Secondary | ICD-10-CM | POA: Diagnosis not present

## 2020-11-28 DIAGNOSIS — F17210 Nicotine dependence, cigarettes, uncomplicated: Secondary | ICD-10-CM | POA: Diagnosis not present

## 2020-11-28 DIAGNOSIS — I6523 Occlusion and stenosis of bilateral carotid arteries: Secondary | ICD-10-CM | POA: Diagnosis not present

## 2020-11-28 DIAGNOSIS — J449 Chronic obstructive pulmonary disease, unspecified: Secondary | ICD-10-CM | POA: Diagnosis not present

## 2020-11-28 DIAGNOSIS — M6281 Muscle weakness (generalized): Secondary | ICD-10-CM | POA: Diagnosis not present

## 2020-11-28 DIAGNOSIS — I10 Essential (primary) hypertension: Secondary | ICD-10-CM | POA: Diagnosis not present

## 2020-11-28 DIAGNOSIS — I959 Hypotension, unspecified: Secondary | ICD-10-CM | POA: Diagnosis not present

## 2020-11-28 NOTE — Patient Outreach (Signed)
[place] [facility]) Care Management  11/28/2020  JERMARI TAMARGO 16-Apr-1955 [id number]   Outgoing call placed to member, no answer, unable to leave voice message.  Will follow up within the next 3-4 business days.  [name], [place], MSN [place] [phone]

## 2020-12-02 ENCOUNTER — Other Ambulatory Visit: Payer: Self-pay | Admitting: Interventional Radiology

## 2020-12-02 DIAGNOSIS — I63411 Cerebral infarction due to embolism of right middle cerebral artery: Secondary | ICD-10-CM

## 2020-12-02 DIAGNOSIS — I10 Essential (primary) hypertension: Secondary | ICD-10-CM | POA: Diagnosis not present

## 2020-12-02 DIAGNOSIS — I2102 ST elevation (STEMI) myocardial infarction involving left anterior descending coronary artery: Secondary | ICD-10-CM | POA: Diagnosis not present

## 2020-12-02 DIAGNOSIS — Z79899 Other long term (current) drug therapy: Secondary | ICD-10-CM | POA: Diagnosis not present

## 2020-12-03 ENCOUNTER — Other Ambulatory Visit: Payer: Self-pay | Admitting: *Deleted

## 2020-12-03 DIAGNOSIS — I63511 Cerebral infarction due to unspecified occlusion or stenosis of right middle cerebral artery: Secondary | ICD-10-CM | POA: Diagnosis not present

## 2020-12-03 DIAGNOSIS — R262 Difficulty in walking, not elsewhere classified: Secondary | ICD-10-CM | POA: Diagnosis not present

## 2020-12-03 DIAGNOSIS — I6523 Occlusion and stenosis of bilateral carotid arteries: Secondary | ICD-10-CM | POA: Diagnosis not present

## 2020-12-03 DIAGNOSIS — F17210 Nicotine dependence, cigarettes, uncomplicated: Secondary | ICD-10-CM | POA: Diagnosis not present

## 2020-12-03 DIAGNOSIS — G4733 Obstructive sleep apnea (adult) (pediatric): Secondary | ICD-10-CM | POA: Diagnosis not present

## 2020-12-03 DIAGNOSIS — E785 Hyperlipidemia, unspecified: Secondary | ICD-10-CM | POA: Diagnosis not present

## 2020-12-03 DIAGNOSIS — J449 Chronic obstructive pulmonary disease, unspecified: Secondary | ICD-10-CM | POA: Diagnosis not present

## 2020-12-03 DIAGNOSIS — I2109 ST elevation (STEMI) myocardial infarction involving other coronary artery of anterior wall: Secondary | ICD-10-CM | POA: Diagnosis not present

## 2020-12-03 DIAGNOSIS — I959 Hypotension, unspecified: Secondary | ICD-10-CM | POA: Diagnosis not present

## 2020-12-03 DIAGNOSIS — I10 Essential (primary) hypertension: Secondary | ICD-10-CM | POA: Diagnosis not present

## 2020-12-03 DIAGNOSIS — Z95828 Presence of other vascular implants and grafts: Secondary | ICD-10-CM | POA: Diagnosis not present

## 2020-12-03 DIAGNOSIS — Z7901 Long term (current) use of anticoagulants: Secondary | ICD-10-CM | POA: Diagnosis not present

## 2020-12-03 DIAGNOSIS — Z7902 Long term (current) use of antithrombotics/antiplatelets: Secondary | ICD-10-CM | POA: Diagnosis not present

## 2020-12-03 DIAGNOSIS — M6281 Muscle weakness (generalized): Secondary | ICD-10-CM | POA: Diagnosis not present

## 2020-12-03 DIAGNOSIS — I429 Cardiomyopathy, unspecified: Secondary | ICD-10-CM | POA: Diagnosis not present

## 2020-12-03 DIAGNOSIS — H409 Unspecified glaucoma: Secondary | ICD-10-CM | POA: Diagnosis not present

## 2020-12-03 DIAGNOSIS — I69354 Hemiplegia and hemiparesis following cerebral infarction affecting left non-dominant side: Secondary | ICD-10-CM | POA: Diagnosis not present

## 2020-12-03 LAB — BASIC METABOLIC PANEL
BUN/Creatinine Ratio: 10 (ref 10–24)
BUN: 9 mg/dL (ref 8–27)
CO2: 22 mmol/L (ref 20–29)
Calcium: 10.4 mg/dL — ABNORMAL HIGH (ref 8.6–10.2)
Chloride: 103 mmol/L (ref 96–106)
Creatinine, Ser: 0.88 mg/dL (ref 0.76–1.27)
Glucose: 89 mg/dL (ref 70–99)
Potassium: 3.4 mmol/L — ABNORMAL LOW (ref 3.5–5.2)
Sodium: 141 mmol/L (ref 134–144)
eGFR: 94 mL/min/{1.73_m2} (ref >59)

## 2020-12-03 LAB — MAGNESIUM: Magnesium: 1.8 mg/dL (ref 1.6–2.3)

## 2020-12-03 NOTE — Patient Outreach (Signed)
[facility]) Care Management  12/03/2020  LEOPOLDO MAZZIE [birth year] [id number]   Outreach attempt #2, successful.  Does not have much time to talk, denies any urgent concerns, encouraged to contact this care manager with questions.  Agrees to follow up within the next month.  Care Plan : [facility] Plan of Care (Adult)  Updates made by [name], RN since 12/03/2020 12:00 AM     Problem: Knowledge deficit related to management of chronic conditions (HTN, CHF, and A-fib) evidenced by recent stroke   Priority: High     Long-Range Goal: Management of chronic conditions (A-fib, CHF, HTN) and recovery from stroke evidenced by no readmissions   Start Date: 11/14/2020  Expected End Date: 05/13/2021  This Visit's Progress: On track  Priority: High  Note:   Current Barriers:  Knowledge Deficits related to plan of care for management of Atrial Fibrillation, CHF, HTN, and Stroke Chronic Disease Management support and education needs related to Atrial Fibrillation, CHF, HTN, and Stroke  RNCM Clinical Goal(s):  Patient will verbalize understanding of plan for management of Atrial Fibrillation, CHF, HTN, and stroke take all medications exactly as prescribed and will call provider for medication related questions attend all scheduled medical appointments: Neurology, cardiology & IR work with [facility] to increase strength and independence (has PT, OT and will have ST for concern for swallowing) not experience hospital admission. Hospital Admissions in last 6 months = 6  through collaboration with RN Care manager, provider, and care team.   Interventions:  Inter-disciplinary care team collaboration (see longitudinal plan of care) Evaluation of current treatment plan related to  self management and patient's adherence to plan as established by provider   Hypertension Interventions: Last practice recorded BP readings:  BP Readings from Last 3 Encounters:  11/08/20 130/90  12/12/19  118/82  08/15/19 128/68  Most recent eGFR/CrCl: No results found for: EGFR  No components found for: CRCL  Evaluation of current treatment plan related to hypertension self management and patient's adherence to plan as established by provider; Provided education to patient re: stroke prevention, s/s of heart attack and stroke; Reviewed medications with patient and discussed importance of compliance; Advised patient, providing education and rationale, to monitor blood pressure daily and record, calling PCP for findings outside established parameters;   Heart Failure Interventions: Provided education on low sodium diet; Reviewed Heart Failure Action Plan in depth and provided written copy; Assessed need for readable accurate scales in home; Provided education about placing scale on hard, flat surface; Discussed importance of daily weight and advised patient to weigh and record daily; Reviewed role of diuretics in prevention of fluid overload and management of heart failure; Discussed the importance of keeping all appointments with provider;  10/27 - Report weight today is 188.1 pounds, wife state this has been stable.  AFIB Interventions:    Counseled on increased risk of stroke due to Afib and benefits of anticoagulation for stroke prevention; Reviewed importance of adherence to anticoagulant exactly as prescribed; Counseled on bleeding risk associated with Eliquis and Plavis and importance of self-monitoring for signs/symptoms of bleeding;   Update 11/15 - Member report speech therapist is currently in the home to work with him.  Has follow up appointment with neurology on 12/15 and will start cardiac rehab on 12/21.  Was seen by cardiology on 11/3, BP, HR, and weights were all stable.  No reported issues noted, no medication changes.  Will follow up with cardiology in 6 months.   Patient Goals/Self-Care Activities:  Patient will self administer medications as prescribed Patient will  attend all scheduled provider appointments Patient will continue to perform ADL's independently Patient will continue to perform IADL's independently  Follow Up Plan:  Telephone follow up appointment with care management team member scheduled for:  12/13 The patient has been provided with contact information for the care management team and has been advised to call with any health related questions or concerns.       [name], [place], MSN [place] [phone]

## 2020-12-04 DIAGNOSIS — J449 Chronic obstructive pulmonary disease, unspecified: Secondary | ICD-10-CM | POA: Diagnosis not present

## 2020-12-04 DIAGNOSIS — I6523 Occlusion and stenosis of bilateral carotid arteries: Secondary | ICD-10-CM | POA: Diagnosis not present

## 2020-12-04 DIAGNOSIS — I63511 Cerebral infarction due to unspecified occlusion or stenosis of right middle cerebral artery: Secondary | ICD-10-CM | POA: Diagnosis not present

## 2020-12-04 DIAGNOSIS — I69354 Hemiplegia and hemiparesis following cerebral infarction affecting left non-dominant side: Secondary | ICD-10-CM | POA: Diagnosis not present

## 2020-12-04 DIAGNOSIS — F17210 Nicotine dependence, cigarettes, uncomplicated: Secondary | ICD-10-CM | POA: Diagnosis not present

## 2020-12-04 DIAGNOSIS — I429 Cardiomyopathy, unspecified: Secondary | ICD-10-CM | POA: Diagnosis not present

## 2020-12-04 DIAGNOSIS — R262 Difficulty in walking, not elsewhere classified: Secondary | ICD-10-CM | POA: Diagnosis not present

## 2020-12-04 DIAGNOSIS — G4733 Obstructive sleep apnea (adult) (pediatric): Secondary | ICD-10-CM | POA: Diagnosis not present

## 2020-12-04 DIAGNOSIS — I959 Hypotension, unspecified: Secondary | ICD-10-CM | POA: Diagnosis not present

## 2020-12-04 DIAGNOSIS — I2109 ST elevation (STEMI) myocardial infarction involving other coronary artery of anterior wall: Secondary | ICD-10-CM | POA: Diagnosis not present

## 2020-12-04 DIAGNOSIS — I10 Essential (primary) hypertension: Secondary | ICD-10-CM | POA: Diagnosis not present

## 2020-12-04 DIAGNOSIS — E785 Hyperlipidemia, unspecified: Secondary | ICD-10-CM | POA: Diagnosis not present

## 2020-12-04 DIAGNOSIS — H409 Unspecified glaucoma: Secondary | ICD-10-CM | POA: Diagnosis not present

## 2020-12-04 DIAGNOSIS — Z95828 Presence of other vascular implants and grafts: Secondary | ICD-10-CM | POA: Diagnosis not present

## 2020-12-04 DIAGNOSIS — Z7901 Long term (current) use of anticoagulants: Secondary | ICD-10-CM | POA: Diagnosis not present

## 2020-12-04 DIAGNOSIS — M6281 Muscle weakness (generalized): Secondary | ICD-10-CM | POA: Diagnosis not present

## 2020-12-04 DIAGNOSIS — Z7902 Long term (current) use of antithrombotics/antiplatelets: Secondary | ICD-10-CM | POA: Diagnosis not present

## 2020-12-05 DIAGNOSIS — F17210 Nicotine dependence, cigarettes, uncomplicated: Secondary | ICD-10-CM | POA: Diagnosis not present

## 2020-12-05 DIAGNOSIS — I2109 ST elevation (STEMI) myocardial infarction involving other coronary artery of anterior wall: Secondary | ICD-10-CM | POA: Diagnosis not present

## 2020-12-05 DIAGNOSIS — I6523 Occlusion and stenosis of bilateral carotid arteries: Secondary | ICD-10-CM | POA: Diagnosis not present

## 2020-12-05 DIAGNOSIS — R262 Difficulty in walking, not elsewhere classified: Secondary | ICD-10-CM | POA: Diagnosis not present

## 2020-12-05 DIAGNOSIS — I429 Cardiomyopathy, unspecified: Secondary | ICD-10-CM | POA: Diagnosis not present

## 2020-12-05 DIAGNOSIS — Z7902 Long term (current) use of antithrombotics/antiplatelets: Secondary | ICD-10-CM | POA: Diagnosis not present

## 2020-12-05 DIAGNOSIS — E785 Hyperlipidemia, unspecified: Secondary | ICD-10-CM | POA: Diagnosis not present

## 2020-12-05 DIAGNOSIS — G4733 Obstructive sleep apnea (adult) (pediatric): Secondary | ICD-10-CM | POA: Diagnosis not present

## 2020-12-05 DIAGNOSIS — Z7901 Long term (current) use of anticoagulants: Secondary | ICD-10-CM | POA: Diagnosis not present

## 2020-12-05 DIAGNOSIS — I63511 Cerebral infarction due to unspecified occlusion or stenosis of right middle cerebral artery: Secondary | ICD-10-CM | POA: Diagnosis not present

## 2020-12-05 DIAGNOSIS — I69354 Hemiplegia and hemiparesis following cerebral infarction affecting left non-dominant side: Secondary | ICD-10-CM | POA: Diagnosis not present

## 2020-12-05 DIAGNOSIS — H409 Unspecified glaucoma: Secondary | ICD-10-CM | POA: Diagnosis not present

## 2020-12-05 DIAGNOSIS — J449 Chronic obstructive pulmonary disease, unspecified: Secondary | ICD-10-CM | POA: Diagnosis not present

## 2020-12-05 DIAGNOSIS — Z95828 Presence of other vascular implants and grafts: Secondary | ICD-10-CM | POA: Diagnosis not present

## 2020-12-05 DIAGNOSIS — I959 Hypotension, unspecified: Secondary | ICD-10-CM | POA: Diagnosis not present

## 2020-12-05 DIAGNOSIS — M6281 Muscle weakness (generalized): Secondary | ICD-10-CM | POA: Diagnosis not present

## 2020-12-05 DIAGNOSIS — I10 Essential (primary) hypertension: Secondary | ICD-10-CM | POA: Diagnosis not present

## 2020-12-06 ENCOUNTER — Telehealth: Payer: Self-pay | Admitting: *Deleted

## 2020-12-06 DIAGNOSIS — Z79899 Other long term (current) drug therapy: Secondary | ICD-10-CM

## 2020-12-06 DIAGNOSIS — I4891 Unspecified atrial fibrillation: Secondary | ICD-10-CM

## 2020-12-06 MED ORDER — ENTRESTO 49-51 MG PO TABS: 1.0000 | ORAL_TABLET | Freq: Two times a day (BID) | ORAL | 6 refills | Status: DC

## 2020-12-06 NOTE — Telephone Encounter (Signed)
-----   Message from Merlene Laughter, RN sent at 12/03/2020  3:34 PM EST -----  ----- Message ----- From: Verta Ellen., NP Sent: 12/03/2020   8:03 AM EST To: Laurine Blazer, LPN  Labs looks good. Potassium just slightly below normal. Calcium slightly above normal. Go ahead and increase Entresto to 49/51 mg po bid and get a follow up BMET and Magnesium in two weeks   Verta Ellen, NP  12/03/2020 8:01 AM

## 2020-12-06 NOTE — Telephone Encounter (Signed)
Patient informed and verbalized understanding of plan. Copy sent to PCP 

## 2020-12-14 DIAGNOSIS — G4733 Obstructive sleep apnea (adult) (pediatric): Secondary | ICD-10-CM | POA: Diagnosis not present

## 2020-12-19 DIAGNOSIS — I4891 Unspecified atrial fibrillation: Secondary | ICD-10-CM | POA: Diagnosis not present

## 2020-12-19 DIAGNOSIS — Z79899 Other long term (current) drug therapy: Secondary | ICD-10-CM | POA: Diagnosis not present

## 2020-12-20 ENCOUNTER — Telehealth: Payer: Self-pay | Admitting: *Deleted

## 2020-12-20 LAB — BASIC METABOLIC PANEL
BUN/Creatinine Ratio: 10 (ref 10–24)
BUN: 9 mg/dL (ref 8–27)
CO2: 22 mmol/L (ref 20–29)
Calcium: 10.3 mg/dL — ABNORMAL HIGH (ref 8.6–10.2)
Chloride: 101 mmol/L (ref 96–106)
Creatinine, Ser: 0.9 mg/dL (ref 0.76–1.27)
Glucose: 103 mg/dL — ABNORMAL HIGH (ref 70–99)
Potassium: 4 mmol/L (ref 3.5–5.2)
Sodium: 140 mmol/L (ref 134–144)
eGFR: 94 mL/min/{1.73_m2} (ref >59)

## 2020-12-20 NOTE — Telephone Encounter (Signed)
-----   Message from Erma Heritage, [place] sent at 12/20/2020 12:07 PM EST ----- Ordered by Jonni Sanger - Electrolytes and kidney function remain stable following recent medication adjustments. Continue current regimen.

## 2020-12-20 NOTE — Telephone Encounter (Signed)
Laurine Blazer, LPN  01/24/6151  7:94 PM EST Back to Top    Notified, copy to pcp.

## 2020-12-31 ENCOUNTER — Other Ambulatory Visit: Payer: Self-pay | Admitting: *Deleted

## 2020-12-31 NOTE — Patient Outreach (Signed)
[facility]) Care Management  [place]  12/31/2020   VALERIO PINARD 29-Nov-1955 [id number]   Outgoing call placed to member, successful.  Denies any urgent concerns, encouraged to contact this care manager with questions.    Encounter Medications:  Outpatient Encounter Medications as of 12/31/2020  Medication Sig   apixaban (ELIQUIS) 5 MG TABS tablet Take 1 tablet (5 mg total) by mouth 2 (two) times daily.   atorvastatin (LIPITOR) 40 MG tablet Take 1 tablet (40 mg total) by mouth daily at 6 PM.   cholecalciferol (VITAMIN D) 1000 units tablet Take 1,000 Units by mouth daily.   clopidogrel (PLAVIX) 75 MG tablet Take 1 tablet (75 mg total) by mouth daily.   metoprolol tartrate (LOPRESSOR) 25 MG tablet Take 0.5 tablets (12.5 mg total) by mouth 2 (two) times daily.   nicotine (NICODERM CQ - DOSED IN MG/24 HOURS) 14 mg/24hr patch Place 1 patch (14 mg total) onto the skin daily.   sacubitril-valsartan (ENTRESTO) 49-51 MG Take 1 tablet by mouth 2 (two) times daily.   spironolactone (ALDACTONE) 25 MG tablet Take 0.5 tablets (12.5 mg total) by mouth daily.   varenicline (CHANTIX) 0.5 MG tablet Take 1 tablet (0.5 mg total) by mouth 2 (two) times daily.   No facility-administered encounter medications on file as of 12/31/2020.    Functional Status:  No flowsheet data found.  Fall/Depression Screening: Fall Risk  11/14/2020 03/19/2017 09/03/2016  Falls in the past year? 0 No No  Number falls in past yr: 0 - -  Injury with Fall? 0 - -   PHQ 2/9 Scores 11/14/2020 06/23/2018 03/19/2017 09/03/2016  PHQ - 2 Score 0 0 0 0    Assessment:   Care Plan Care Plan : RNCM Plan of Care (Adult)  Updates made by [name], RN since 12/31/2020 12:00 AM     Problem: Knowledge deficit related to management of chronic conditions (HTN, CHF, and A-fib) evidenced by recent stroke   Priority: High     Long-Range Goal: Management of chronic conditions (A-fib, CHF, HTN) and  recovery from stroke evidenced by no readmissions   Start Date: 11/14/2020  Expected End Date: 05/13/2021  This Visit's Progress: On track  Recent Progress: On track  Priority: High  Note:   Current Barriers:  Knowledge Deficits related to plan of care for management of Atrial Fibrillation, CHF, HTN, and Stroke Chronic Disease Management support and education needs related to Atrial Fibrillation, CHF, HTN, and Stroke  RNCM Clinical Goal(s):  Patient will verbalize understanding of plan for management of Atrial Fibrillation, CHF, HTN, and stroke take all medications exactly as prescribed and will call provider for medication related questions attend all scheduled medical appointments: Neurology, cardiology & IR work with [facility] to increase strength and independence (has PT, OT and will have ST for concern for swallowing) not experience hospital admission. Hospital Admissions in last 6 months = 6  through collaboration with RN Care manager, provider, and care team.   Interventions:  Inter-disciplinary care team collaboration (see longitudinal plan of care) Evaluation of current treatment plan related to  self management and patient's adherence to plan as established by provider   Hypertension Interventions: Last practice recorded BP readings:  BP Readings from Last 3 Encounters:  11/08/20 130/90  12/12/19 118/82  08/15/19 128/68  Most recent eGFR/CrCl: No results found for: EGFR  No components found for: CRCL  Evaluation of current treatment plan related to hypertension self management and patient's adherence to plan as  established by provider; Provided education to patient re: stroke prevention, s/s of heart attack and stroke; Reviewed medications with patient and discussed importance of compliance; Advised patient, providing education and rationale, to monitor blood pressure daily and record, calling PCP for findings outside established parameters;   Heart Failure  Interventions: Provided education on low sodium diet; Reviewed Heart Failure Action Plan in depth and provided written copy; Assessed need for readable accurate scales in home; Provided education about placing scale on hard, flat surface; Discussed importance of daily weight and advised patient to weigh and record daily; Reviewed role of diuretics in prevention of fluid overload and management of heart failure; Discussed the importance of keeping all appointments with provider;  Update 10/27 - Report weight today is 188.1 pounds, wife state this has been stable.  AFIB Interventions:    Counseled on increased risk of stroke due to Afib and benefits of anticoagulation for stroke prevention; Reviewed importance of adherence to anticoagulant exactly as prescribed; Counseled on bleeding risk associated with Eliquis and Plavis and importance of self-monitoring for signs/symptoms of bleeding;   Update 11/15 - Member report speech therapist is currently in the home to work with him.  Has follow up appointment with neurology on 12/15 and will start cardiac rehab on 12/21.  Was seen by cardiology on 11/3, BP, HR, and weights were all stable.  No reported issues noted, no medication changes.  Will follow up with cardiology in 6 months.   Patient Goals/Self-Care Activities: Patient will self administer medications as prescribed Patient will attend all scheduled provider appointments Patient will continue to perform ADL's independently Patient will continue to perform IADL's independently  Follow Up Plan:  Telephone follow up appointment with care management team member scheduled for:  01/28/2021 The patient has been provided with contact information for the care management team and has been advised to call with any health related questions or concerns.    Update 12/13 - Report he has continued to improve well with therapy at home, speech is much clearer today than with initial assessment.  He has  follow up with neuro this Thursday and will have orientation for cardiac rehab next week.  Once orientation is complete, he will have his rehab schedule.  State that he is tolerating his increased dose of Entresto without problems (dose changed on 11/18).  He continues to monitor blood pressure, HR, and weight daily, unable to get to notebook for readings today but state they have been normal.  Does report weight was 185 pounds this morning, decreased from last outreach.  Report concern for problems with sleeping, inquires if any medications could cause this.  He is taking Chantix, side effect of insomnia.  He will discuss with provider during office visit this week.  State he also has follow up with PCP on 1/3.          Plan:  Follow-up: Patient agrees to Care Plan and Follow-up. Follow-up in 1 month(s).  [name], [place], MSN [place] [phone]

## 2020-12-31 NOTE — Patient Instructions (Signed)
Visit Information  Thank you for taking time to visit with me today. Please don't hesitate to contact me if I can be of assistance to you before our next scheduled telephone appointment.  Following are the goals we discussed today:  Continue daily weights, BP, and HR recordings.  Discuss insomnia with provider.  Our next appointment is by telephone next month   The patient verbalized understanding of instructions, educational materials, and care plan provided today and agreed to receive a mailed copy of patient instructions, educational materials, and care plan.   The patient has been provided with contact information for the care management team and has been advised to call with any health related questions or concerns.    [name], [place], MSN [place] [phone]

## 2021-01-02 ENCOUNTER — Ambulatory Visit: Payer: Medicare Other | Admitting: Neurology

## 2021-01-02 ENCOUNTER — Other Ambulatory Visit: Payer: Self-pay

## 2021-01-02 ENCOUNTER — Inpatient Hospital Stay: Payer: Medicare Other | Admitting: Neurology

## 2021-01-02 VITALS — BP 178/81 | HR 80 | Ht 72.0 in | Wt 191.0 lb

## 2021-01-02 DIAGNOSIS — I2109 ST elevation (STEMI) myocardial infarction involving other coronary artery of anterior wall: Secondary | ICD-10-CM

## 2021-01-02 DIAGNOSIS — I63511 Cerebral infarction due to unspecified occlusion or stenosis of right middle cerebral artery: Secondary | ICD-10-CM | POA: Diagnosis not present

## 2021-01-02 DIAGNOSIS — I63239 Cerebral infarction due to unspecified occlusion or stenosis of unspecified carotid arteries: Secondary | ICD-10-CM

## 2021-01-02 NOTE — Progress Notes (Signed)
[facility] [address] [place] [address] [phone]       OFFICE CONSULT NOTE  [name] Date of Birth:  1955/02/17 Medical Record Number:  [id number]   Referring MD: Rosalin Hawking  Reason for Referral: Stroke  HPI: Christopher Lang is a 65 year old African-American male seen today for initial office consultation visit for stroke.  History is obtained from the patient, review of electronic medical records and personally reviewed pertinent available imaging films in PACS.  He has past medical history significant for hyperlipidemia, sleep apnea, coronary artery disease, COPD, atrial fibrillation who presented on 11/03/2020 with chest pain and feeling flushed by the time EMS were called by the wife and they arrived they noted she had left sided weakness EKGs done by EMS suggested anterior STEMI he was brought in as a code STEMI in the ED code stroke was activated and he was found to have significant left hemiplegia.  CT scan of the head showed a hyperdense right M1 1 sign and CT angiogram of the head and neck confirmed right M1 occlusion with severe bilateral ICA origin stenosis with underfilling of the left carotid with moderate to severe stenosis of bilateral carotid siphons and 60% left subclavian origin stenosis.  Patient was given IV TNKase followed by successful mechanical thrombectomy and  TICI 3 revascularization of the right MCA by Dr. Earleen Newport.  He is kept in the ICU with close monitoring and made steady improvement.  MRI scan showed patchy acute infarcts on the right more than the left with trace petechial hemorrhage.  Carotid ultrasound showed 80 to 90% right ICA stenosis and left ICA stenosis difficult to measure due to severe/narrowing.  2D echo showed ejection fraction of 20 to 25% with entire apex being a kinetic.  Myocardium appeared thin.  LDL cholesterol was 77 mg percent hemoglobin A1c 5.0.  Patient a few days later subsequent underwent elective right  carotid artery angioplasty and stenting by Dr. Earleen Newport which was uneventful.  Started on Eliquis for his A. fib and cardiomyopathy and as well as Plavix for his stent.  Patient states is done well since discharge.  He is having some minor bruising but no bleeding complications.  Is tolerating Lipitor well without muscle aches and pains.  He does get some cramps at night but these are not bothersome.  He has followed up with cardiologist who has not increase the dose of Entresto recently and did some follow-up labs and has scheduled an echocardiogram next month.  Patient is able to walk independently without shortness of breath.  Difficulty walking is mostly related to one leg being shorter than the other.  He is currently just finished his physical occupational therapy.  He has some diminished fine motor skills on the left and has trouble buttoning his shirts.  He has not yet followed up with Dr. Earleen Newport to consider left carotid artery revascularization.  He feels he is able to do most activities of daily living: Himself except he is walking with a cane and has some diminished fine motor skills.  ROS:   14 system review of systems is positive for weakness, leg cramps, muscle aches, difficulty walking, thigh pain, leg pain all other systems negative  PMH:  Past Medical History:  Diagnosis Date   Acute CVA (cerebrovascular accident) ([place]) 11/03/2020   Acute left-sided weakness 11/03/2020   Acute ST elevation myocardial infarction (STEMI) ([place]) 11/03/2020   Cataract    Removed Dec 2017/Jan 2018   COPD (chronic obstructive  pulmonary disease) ([place])    Essential hypertension    H. 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  Tobacco Use   Smoking status: Former    Packs/day: 1.00    Years: 45.00    Pack years: 45.00    Types: Cigarettes    Quit date: 11/03/2020    Years since quitting: 0.1   Smokeless tobacco: Never   Tobacco comments:    started back - pack per day  Vaping Use   Vaping Use: Never used  Substance and Sexual Activity   Alcohol use: Yes    Alcohol/week: 0.0 standard drinks    Comment: occas   Drug use: No   Sexual activity: Yes    Birth control/protection: Post-menopausal  Other Topics Concern   Not on file  Social History Narrative   Lives with wife [name]   Leisure - couch potato   Social Determinants of Health   Financial Resource Strain: Low Risk    Difficulty of Paying Living Expenses: Not hard at all  Food Insecurity: No Food Insecurity   Worried About Charity fundraiser in the Last Year: Never true   Arboriculturist in the Last Year: Never true  Transportation Needs: No Transportation Needs   Lack of Transportation (Medical): No   Lack of Transportation (Non-Medical): No  Physical Activity: Inactive   Days of Exercise per Week: 0 days   Minutes of Exercise per Session: 0 min  Stress: No Stress Concern Present   Feeling of Stress : Only a little  Social Connections: Not on file  Intimate Partner Violence: Not At Risk   Fear of Current or Ex-Partner: No   Emotionally Abused: No   Physically Abused: No   Sexually Abused: No    Medications:   Current Outpatient Medications on File Prior to Visit  Medication Sig Dispense Refill   apixaban (ELIQUIS) 5 MG TABS tablet Take 1 tablet (5 mg total) by mouth 2 (two) times daily. 60 tablet 0   atorvastatin (LIPITOR) 40 MG tablet Take 1 tablet (40 mg total) by mouth daily at 6 PM. 60 tablet 0   clopidogrel (PLAVIX) 75 MG tablet Take 1  tablet (75 mg total) by mouth daily. 60 tablet 0   metoprolol tartrate (LOPRESSOR) 25 MG tablet Take 0.5 tablets (12.5 mg total) by mouth 2 (two) times daily. 60 tablet 0   sacubitril-valsartan (ENTRESTO) 49-51 MG Take 1 tablet by mouth 2 (two) times daily. 60 tablet 6   spironolactone (ALDACTONE) 25 MG tablet Take 0.5 tablets (12.5 mg total) by mouth daily. 60 tablet 0   varenicline (CHANTIX) 0.5 MG tablet Take 1 tablet (0.5 mg total) by mouth 2 (two) times daily. 240 tablet 0   No current facility-administered medications on file prior to visit.    Allergies:  No Known Allergies  Physical Exam General: Mildly obese  middle-aged African-American male, seated, in no evident distress Head: head  normocephalic and atraumatic.   Neck: supple with no carotid or supraclavicular bruits Cardiovascular: regular rate and rhythm, no murmurs Musculoskeletal: no deformity Skin:  no rash/petichiae Vascular:  Normal pulses all extremities  Neurologic Exam Mental Status: Awake and fully alert. Oriented to place and time. Recent and remote memory intact. Attention span, concentration and fund of knowledge appropriate. Mood and affect appropriate.  Cranial Nerves: Fundoscopic exam reveals sharp disc margins. Pupils equal, briskly reactive to light. Extraocular movements full without nystagmus. Visual fields full to confrontation. Hearing intact. Facial sensation intact.  Mild left lower facial asymmetry., tongue, palate moves normally and symmetrically.  Motor: Normal bulk and tone. Normal strength in all tested extremity muscles. Sensory.: intact to touch , pinprick , position and vibratory sensation.  Coordination: Rapid alternating movements normal in all extremities. Finger-to-nose and heel-to-shin performed accurately bilaterally. Gait and Station: Arises from chair without difficulty. Stance is normal. Gait demonstrates normal stride length and balance but favors left leg as it is shorter than the  right.. Able to heel, toe and tandem walk with moderate difficulty.  Reflexes: 1+ and symmetric. Toes downgoing.   NIHSS  1 Modified Rankin  2   ASSESSMENT: 65 year old African-American male with right MCA infarct secondary to right M1 occlusion due to embolization from high-grade proximal carotid stenosis as well as acute myocardial infarction treated with IV TNK followed by successful mechanical thrombectomy with TICI 3 revascularization.  Subsequent elective right proximal carotid angioplasty stenting done a few days later.  Vascular risk factors of coronary artery disease hypertension, hyperlipidemia, smoking, ischemic cardiomyopathy     PLAN:I had a long discussion with the patient with regards to his recent stroke, symptomatic carotid stenosis, acute myocardial infarction and risk for recurrent stroke and answered questions.  Continue Eliquis and Plavix for stroke prevention and follow-up with Dr. Earleen Newport for elective left carotid revascularization consideration.  Maintain aggressive risk factor modification with strict control of hypertension with blood pressure goal below 130/90, diabetes with hemoglobin A1c goal below 6.5% and lipids with LDL cholesterol goal below 70 mg percent.  I complemented him on smoking cessation and advised him to continue to do so.  He was encouraged to eat a healthy diet with lots of fruits, vegetables, cereals, whole grains and to be active and exercise regularly.  He was advised to follow-up with his cardiologist for his cardiac prevention strategy.  He will return for follow-up in the future in 3 to 4 months or call earlier if necessary.  Greater than 50% time during this 45-minute consultation visit were spent on counseling and coordination of care about his recent stroke and carotid stenosis and revascularization and answering questions Antony Contras, MD  Note: This document was prepared with digital dictation and possible smart phrase technology. Any  transcriptional errors that result from this process are unintentional.

## 2021-01-02 NOTE — Patient Instructions (Signed)
I had a long discussion with the patient with regards to his recent stroke, symptomatic carotid stenosis, acute myocardial infarction and risk for recurrent stroke and answered questions.  Continue Eliquis and Plavix for stroke prevention and follow-up with Dr. Earleen Newport for elective left carotid revascularization consideration.  Maintain aggressive risk factor modification with strict control of hypertension with blood pressure goal below 130/90, diabetes with hemoglobin A1c goal below 6.5% and lipids with LDL cholesterol goal below 70 mg percent.  I complemented him on smoking cessation and advised him to continue to do so.  He was encouraged to eat a healthy diet with lots of fruits, vegetables, cereals, whole grains and to be active and exercise regularly.  He was advised to follow-up with his cardiologist for his cardiac prevention strategy.  He will return for follow-up in the future in 3 to 4 months or call earlier if necessary. Stroke Prevention Some medical conditions and behaviors can lead to a higher chance of having a stroke. You can help prevent a stroke by eating healthy, exercising, not smoking, and managing any medical conditions you have. Stroke is a leading cause of functional impairment. Primary prevention is particularly important because a majority of strokes are first-time events. Stroke changes the lives of not only those who experience a stroke but also their family and other caregivers. How can this condition affect me? A stroke is a medical emergency and should be treated right away. A stroke can lead to brain damage and can sometimes be life-threatening. If a person gets medical treatment right away, there is a better chance of surviving and recovering from a stroke. What can increase my risk? The following medical conditions may increase your risk of a stroke: Cardiovascular disease. High blood pressure (hypertension). Diabetes. High cholesterol. Sickle cell disease. Blood  clotting disorders (hypercoagulable state). Obesity. Sleep disorders (obstructive sleep apnea). Other risk factors include: Being older than age 56. Having a history of blood clots, stroke, or mini-stroke (transient ischemic attack, TIA). Genetic factors, such as race, ethnicity, or a family history of stroke. Smoking cigarettes or using other tobacco products. Taking birth control pills, especially if you also use tobacco. Heavy use of alcohol or drugs, especially cocaine and methamphetamine. Physical inactivity. What actions can I take to prevent this? Manage your health conditions High cholesterol levels. Eating a healthy diet is important for preventing high cholesterol. If cholesterol cannot be managed through diet alone, you may need to take medicines. Take any prescribed medicines to control your cholesterol as told by your health care provider. Hypertension. To reduce your risk of stroke, try to keep your blood pressure below 130/80. Eating a healthy diet and exercising regularly are important for controlling blood pressure. If these steps are not enough to manage your blood pressure, you may need to take medicines. Take any prescribed medicines to control hypertension as told by your health care provider. Ask your health care provider if you should monitor your blood pressure at home. Have your blood pressure checked every year, even if your blood pressure is normal. Blood pressure increases with age and some medical conditions. Diabetes. Eating a healthy diet and exercising regularly are important parts of managing your blood sugar (glucose). If your blood sugar cannot be managed through diet and exercise, you may need to take medicines. Take any prescribed medicines to control your diabetes as told by your health care provider. Get evaluated for obstructive sleep apnea. Talk to your health care provider about getting a sleep evaluation if you  snore a lot or have excessive  sleepiness. Make sure that any other medical conditions you have, such as atrial fibrillation or atherosclerosis, are managed. Nutrition Follow instructions from your health care provider about what to eat or drink to help manage your health condition. These instructions may include: Reducing your daily calorie intake. Limiting how much salt (sodium) you use to 1,500 milligrams (mg) each day. Using only healthy fats for cooking, such as olive oil, canola oil, or sunflower oil. Eating healthy foods. You can do this by: Choosing foods that are high in fiber, such as whole grains, and fresh fruits and vegetables. Eating at least 5 servings of fruits and vegetables a day. Try to fill one-half of your plate with fruits and vegetables at each meal. Choosing lean protein foods, such as lean cuts of meat, poultry without skin, fish, tofu, beans, and nuts. Eating low-fat dairy products. Avoiding foods that are high in sodium. This can help lower blood pressure. Avoiding foods that have saturated fat, trans fat, and cholesterol. This can help prevent high cholesterol. Avoiding processed and prepared foods. Counting your daily carbohydrate intake.  Lifestyle If you drink alcohol: Limit how much you have to: 0-1 drink a day for women who are not pregnant. 0-2 drinks a day for men. Know how much alcohol is in your drink. In the [place] one drink equals one 12 oz bottle of beer (364mL), one 5 oz glass of wine (130mL), or one 1 oz glass of hard liquor (85mL). Do not use any products that contain nicotine or tobacco. These products include cigarettes, chewing tobacco, and vaping devices, such as e-cigarettes. If you need help quitting, ask your health care provider. Avoid secondhand smoke. Do not use drugs. Activity  Try to stay at a healthy weight. Get at least 30 minutes of exercise on most days, such as: Fast walking. Biking. Swimming. Medicines Take over-the-counter and prescription medicines  only as told by your health care provider. Aspirin or blood thinners (antiplatelets or anticoagulants) may be recommended to reduce your risk of forming blood clots that can lead to stroke. Avoid taking birth control pills. Talk to your health care provider about the risks of taking birth control pills if: You are over 81 years old. You smoke. You get very bad headaches. You have had a blood clot. Where to find more information American Stroke Association: www.strokeassociation.org Get help right away if: You or a loved one has any symptoms of a stroke. "BE FAST" is an easy way to remember the main warning signs of a stroke: B - Balance. Signs are dizziness, sudden trouble walking, or loss of balance. E - Eyes. Signs are trouble seeing or a sudden change in vision. F - Face. Signs are sudden weakness or numbness of the face, or the face or eyelid drooping on one side. A - Arms. Signs are weakness or numbness in an arm. This happens suddenly and usually on one side of the body. S - Speech. Signs are sudden trouble speaking, slurred speech, or trouble understanding what people say. T - Time. Time to call emergency services. Write down what time symptoms started. You or a loved one has other signs of a stroke, such as: A sudden, severe headache with no known cause. Nausea or vomiting. Seizure. These symptoms may represent a serious problem that is an emergency. Do not wait to see if the symptoms will go away. Get medical help right away. Call your local emergency services (911 in the [place] Do not  drive yourself to the hospital. Summary You can help to prevent a stroke by eating healthy, exercising, not smoking, limiting alcohol intake, and managing any medical conditions you may have. Do not use any products that contain nicotine or tobacco. These include cigarettes, chewing tobacco, and vaping devices, such as e-cigarettes. If you need help quitting, ask your health care provider. Remember "BE  FAST" for warning signs of a stroke. Get help right away if you or a loved one has any of these signs. This information is not intended to replace advice given to you by your health care provider. Make sure you discuss any questions you have with your health care provider. Document Revised: 08/07/2019 Document Reviewed: 08/07/2019 Elsevier Patient Education  [employer].

## 2021-01-08 ENCOUNTER — Encounter (HOSPITAL_COMMUNITY): Admission: RE | Admit: 2021-01-08 | Payer: Medicare Other | Source: Ambulatory Visit

## 2021-01-13 DIAGNOSIS — G4733 Obstructive sleep apnea (adult) (pediatric): Secondary | ICD-10-CM | POA: Diagnosis not present

## 2021-01-21 DIAGNOSIS — E782 Mixed hyperlipidemia: Secondary | ICD-10-CM | POA: Diagnosis not present

## 2021-01-21 DIAGNOSIS — I5022 Chronic systolic (congestive) heart failure: Secondary | ICD-10-CM | POA: Diagnosis not present

## 2021-01-21 DIAGNOSIS — I480 Paroxysmal atrial fibrillation: Secondary | ICD-10-CM | POA: Diagnosis not present

## 2021-01-21 DIAGNOSIS — G4739 Other sleep apnea: Secondary | ICD-10-CM | POA: Diagnosis not present

## 2021-01-21 DIAGNOSIS — I10 Essential (primary) hypertension: Secondary | ICD-10-CM | POA: Diagnosis not present

## 2021-01-23 ENCOUNTER — Ambulatory Visit
Admission: RE | Admit: 2021-01-23 | Discharge: 2021-01-23 | Disposition: A | Discharge status: Home / Self Care | Payer: Self-pay | Source: Ambulatory Visit | Attending: Physician Assistant | Admitting: Physician Assistant

## 2021-01-23 DIAGNOSIS — I63511 Cerebral infarction due to unspecified occlusion or stenosis of right middle cerebral artery: Secondary | ICD-10-CM | POA: Diagnosis not present

## 2021-01-23 DIAGNOSIS — I63411 Cerebral infarction due to embolism of right middle cerebral artery: Secondary | ICD-10-CM

## 2021-01-23 DIAGNOSIS — I63233 Cerebral infarction due to unspecified occlusion or stenosis of bilateral carotid arteries: Secondary | ICD-10-CM | POA: Diagnosis not present

## 2021-01-23 HISTORY — PX: IR RADIOLOGIST EVAL & MGMT: IMG5224

## 2021-01-23 NOTE — Progress Notes (Signed)
Chief Complaint: Prior acute ELVO Left ICA high grade stenosis  Referring Physician(s): Kinde A  Neurology: Dr. Antony Contras  History of Present Illness: Christopher Lang is a 66 y.o. male presenting as a scheduled follow up to NIR/VIR clinic, previously treated at [facility] for Code Stroke 11/03/2020, with mechanical thrombectomy of right MCA, and then interval right ICA stenting.   Christopher Lang is here today in the clinic by himself for the appointment.   He was admitted emergently to [facility] 11/03/20 with acute MI, with his original symptoms of nausea, dry-heaving, and chest pain.  When the EMS arrived to evaluate, left sided weakness had started.  He was treated at [facility] for STEMI, with deferral of cardiac cath for the thrombectomy performed of the left ICA/left MCA.    Presenting NIHSS: 15, mRS: 0.   Mechanical thrombectomy was performed 11/03/20, then with interval right ICA stenting for symptomatic high grade stenosis.    Discharge was 11/08/2020, with his last neurology appointment 01/02/21.   Today he tells me that he feels he has recovered very well, with new baseline mRS of 2, and NIHSS of 1.  He feels that he has some reduced sensation of the left hand, but his lower extremities remain normal.  He does not perceive any strength difference in his hand compared to the right.   He is right-handed.   CTA on admission demonstrates essentially a critical left ICA stenosis/string sign.  Duplex did not measure an elevated velocity at the bifurcation, as there was calcified plaque obscuring the measurement.  Certainly beyond the bifurcation, there is spectral broadening and parvus tardus waveform compatible with the high grade stenosis on the CTA.   ECHO 11/03/2020: EF: 20 - 25%, with severely decreased function, and wall motion abnormality suggesting LAD infarction.  Impression: Ischemic cardiomyopathy  EKG: 11/03/20: STEMI Anterior-septal   Currently he is taking maximal  medical therapy, including anti-lipids, plavix, HTN medication, and eliquis for A-fib.   He tells me "I haven't smoked a thing since October 16th", and I did congratulate him on early success.   Past Medical History:  Diagnosis Date   Acute CVA (cerebrovascular accident) ([place]) 11/03/2020   Acute left-sided weakness 11/03/2020   Acute ST elevation myocardial infarction (STEMI) ([place]) 11/03/2020   Cataract    Removed Dec 2017/Jan 2018   COPD (chronic obstructive pulmonary disease) ([place])    Essential hypertension    H. pylori infection 11/2016   With documented eradication via biopsy Feb 2019   History of atrial fibrillation    November 2017   Hyperlipidemia    Sleep apnea    Did not tolerate CPAP   Tubulovillous adenoma of rectum 04/2010    Past Surgical History:  Procedure Laterality Date   CATARACT EXTRACTION W/PHACO Right 01/16/2016   Procedure: CATARACT EXTRACTION PHACO AND INTRAOCULAR LENS PLACEMENT RIGHT EYE CDE= 6.97;  Surgeon: Tonny Branch, MD;  Location: [facility];  Service: Ophthalmology;  Laterality: Right;  right   CATARACT EXTRACTION W/PHACO Left 01/30/2016   Procedure: CATARACT EXTRACTION PHACO AND INTRAOCULAR LENS PLACEMENT LEFT EYE CDE 3.13;  Surgeon: Tonny Branch, MD;  Location: [facility];  Service: Ophthalmology;  Laterality: Left;  left   COLONOSCOPY  2012   COLONOSCOPY N/A 12/04/2016   three 4-6 mm tubular adenomas, moderate size external and internal hemorrhoids, redundant left colon, 3 year surveillance   ESOPHAGOGASTRODUODENOSCOPY N/A 12/04/2016   Dr. Oneida Alar: normal esophagus, small hiatal hernia, chronic gastritis with H.pylori, peptic duodenitis, atypical lymphoid proliferation.  Surveillance EGD Feb 2019 without abnormal cells, negative H.pylori   ESOPHAGOGASTRODUODENOSCOPY N/A 03/15/2017   mild gastritis, no atypical cells, negative H.pylori   EXCISION, SKI  08/2016   R THIGH   IR ANGIO INTRA EXTRACRAN SEL COM CAROTID INNOMINATE BILAT MOD SED  11/06/2020   IR CT HEAD  LTD  11/03/2020   IR INTRAVSC STENT CERV CAROTID W/EMB-PROT MOD SED INCL ANGIO  11/06/2020   IR PERCUTANEOUS ART THROMBECTOMY/INFUSION INTRACRANIAL INC DIAG ANGIO  11/03/2020   IR PTA INTRACRANIAL  11/04/2020   IR US GUIDE VASC ACCESS LEFT  11/03/2020   IR US GUIDE VASC ACCESS RIGHT  11/03/2020   IR US GUIDE VASC ACCESS RIGHT  11/06/2020   RADIOLOGY WITH ANESTHESIA N/A 11/03/2020   Procedure: RADIOLOGY WITH ANESTHESIA;  Surgeon: Radiologist, Medication, MD;  Location: [facility];  Service: Radiology;  Laterality: N/A;   RADIOLOGY WITH ANESTHESIA N/A 11/06/2020   Procedure: STENTING;  Surgeon: Corrie Mckusick, DO;  Location: [facility];  Service: Anesthesiology;  Laterality: N/A;    Allergies: Patient has no known allergies.  Medications: Prior to Admission medications   Medication Sig Start Date End Date Taking? Authorizing Provider  apixaban (ELIQUIS) 5 MG TABS tablet Take 1 tablet (5 mg total) by mouth 2 (two) times daily. 11/08/20   [name], PA-C  atorvastatin (LIPITOR) 40 MG tablet Take 1 tablet (40 mg total) by mouth daily at 6 PM. 11/08/20   [name], PA-C  clopidogrel (PLAVIX) 75 MG tablet Take 1 tablet (75 mg total) by mouth daily. 11/09/20   [name], PA-C  diphenhydrAMINE (BENADRYL) 25 MG tablet Take 50 mg by mouth at bedtime as needed for sleep.    [provider]  Melatonin 10 MG CAPS Take 10 mg by mouth at bedtime as needed (sleep).    [provider]  metoprolol tartrate (LOPRESSOR) 25 MG tablet Take 0.5 tablets (12.5 mg total) by mouth 2 (two) times daily. 11/08/20   [name], PA-C  sacubitril-valsartan (ENTRESTO) 49-51 MG Take 1 tablet by mouth 2 (two) times daily. 12/06/20   Verta Ellen., NP  spironolactone (ALDACTONE) 25 MG tablet Take 0.5 tablets (12.5 mg total) by mouth daily. 11/09/20   [name], PA-C  varenicline (CHANTIX) 0.5 MG tablet Take 1 tablet (0.5 mg total) by mouth 2 (two) times daily. 11/08/20    [name], PA-C     Family History  Problem Relation Age of Onset   CAD Mother    High blood pressure Mother    Heart attack Mother    Leukemia Father    Early death Father    Heart attack Sister    Diabetes Sister    Cancer Brother    Deep vein thrombosis Brother    Hypertension Daughter    Stroke Sister    Stroke Sister    Heart attack Sister    Deep vein thrombosis Sister    Hypertension Sister    Diabetes Sister    Arthritis Sister    Arthritis Sister    Arthritis Sister    Hypertension Sister    Heart attack Sister    Cancer Brother        KIDNEY   Kidney disease Brother    Deep vein thrombosis Brother    Seizures Brother    Heart attack Brother    Colon cancer Neg Hx    Colon polyps Neg Hx     Social History   Socioeconomic History  Marital status: Married    Spouse name: [name]   Number of children: 2   Years of education: GED   Highest education level: Not on file  Occupational History   Occupation: disability    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  Tobacco Use   Smoking status: Former    Packs/day: 1.00    Years: 45.00    Pack years: 45.00    Types: Cigarettes    Quit date: 11/03/2020    Years since quitting: 0.2   Smokeless tobacco: Never   Tobacco comments:    started back - pack per day  Vaping Use   Vaping Use: Never used  Substance and Sexual Activity   Alcohol use: Yes    Alcohol/week: 0.0 standard drinks    Comment: occas   Drug use: No   Sexual activity: Yes    Birth control/protection: Post-menopausal  Other Topics Concern   Not on file  Social History Narrative   Lives with wife [name]   Leisure - couch potato   Social Determinants of Health   Financial Resource Strain: Low Risk    Difficulty of Paying Living Expenses: Not hard at all  Food Insecurity: No Food Insecurity   Worried About Charity fundraiser in the Last Year: Never true   Arboriculturist in the Last Year: Never true  Transportation Needs: No  Transportation Needs   Lack of Transportation (Medical): No   Lack of Transportation (Non-Medical): No  Physical Activity: Inactive   Days of Exercise per Week: 0 days   Minutes of Exercise per Session: 0 min  Stress: No Stress Concern Present   Feeling of Stress : Only a little  Social Connections: Not on file       Review of Systems: A 12 point ROS discussed and pertinent positives are indicated in the HPI above.  All other systems are negative.  Review of Systems  Vital Signs: BP (!) 164/87 (BP Location: Right Arm)    Pulse 75    SpO2 95%   Physical Exam General: 66 yo male appearing stated age.  Well-developed, well-nourished.  No distress. HEENT: Atraumatic, normocephalic.  Conjugate gaze, extra-ocular motor intact. No scleral icterus or scleral injection. No lesions on external ears, nose, lips, or gums.  Oral mucosa moist, pink.  Neck: Symmetric with no goiter enlargement.  Chest/Lungs:  Symmetric chest with inspiration/expiration.  No labored breathing.  Clear to auscultation with no wheezes, rhonchi, or rales.  Heart:  No third heart sounds appreciated. No JVD appreciated.  Abdomen:  Soft, NT/ND, with + bowel sounds.   Genito-urinary: Deferred Neurologic: Alert & Oriented to person, place, and time.   Normal affect and insight.  Appropriate questions.  Moving all 4 extremities with gross sensory intact.  Strength of the bilateral upper extremities symmetric 5/5 Strength of the bilateral lower extremities symmetric 5/5 Pulse Exam:  No right bruit appreciated.   Bruit of the left neck. This gets softer with auscultation of the supraclavicular level.  Palpable bilateral radial arteries Extremities: No swelling of the lower extremity.     Mallampati Score:     Imaging: No results found.  Labs:  CBC: Recent Labs    11/05/20 0405 11/06/20 0425 11/07/20 0302 11/08/20 0145  WBC 9.6 9.5 9.3 9.0  HGB 13.0 12.9* 12.8* 12.4*  HCT 38.0* 37.4* 37.1* 35.8*  PLT 117*  134* 129* 137*    COAGS: Recent Labs    11/03/20 0603  INR 1.0  APTT 39*  BMP: Recent Labs    11/05/20 0405 11/06/20 0425 11/07/20 0302 11/08/20 0145 12/02/20 1337 12/19/20 1036  NA 135 129* 135 135 141 140  K 3.6 2.9* 3.4* 3.1* 3.4* 4.0  CL 104 100 104 105 103 101  CO2 23 23 23 24 22 22   GLUCOSE 101* 99 90 102* 89 103*  BUN 15 14 11 11 9 9   CALCIUM 9.4 8.9 9.2 9.2 10.4* 10.3*  CREATININE 0.83 0.80 0.76 0.82 0.88 0.90  GFRNONAA >60 >60 >60 >60  --   --     LIVER FUNCTION TESTS: Recent Labs    11/03/20 0603  BILITOT 1.8*  AST 83*  ALT 30  ALKPHOS 131*  PROT 7.5  ALBUMIN 4.0    TUMOR MARKERS: No results for input(s): AFPTM, CEA, CA199, CHROMGRNA in the last 8760 hours.     Assessment & Plan:    Christopher Lang is a very pleasant 66 year-old male with CVA and CAD, treated in October 2022 with acute STEMI and right ICA/MCA acute ELVO/stroke, with medical care for his cardiac event and mechanical thrombectomy/ICA stenting for his stroke.   He has recovered from his previous event, and has contralateral asymptomatic carotid arterial disease, with high grade stenosis of the left ICA.    Non-invasive imaging with CTA shows critical stenosis/string sign, and the carotid duplex did not measure a velocity at the bifurcation secondary to calcified plaque.     Today I had a lengthy discussion with Christopher Lang regarding the definition and epidemiology of stroke/TIA, associated cardiovascular risk factors, pathology/pathophysiology, the natural history of extracranial carotid arterial disease/risk of recurrent TIA/stroke, and the goals of therapy in this setting. Specifically, reducing risk of recurrent stroke/disability.  We discussed known association of extracranial carotid atherosclerotic disease and ipsilateral stroke risk, with causative risk generally accepted at least 15% - 25%.   I also introduced the contemporary concept of intensive medical management (IMM), considered the  synergistic benefit of multiple continuous and life-long approaches to stroke risk factor reduction and behavioral modification, endorsed by the American Heart Association.   Recommendations from American Heart Association (AHA Guidelines, Kleindorfer) and other multi-disciplinary guidelines were emphasized, including: Lifestyle modification (reduce sodium, physical activity/weight reduction, limit ETOH/illicit substances), Treatment of hypertension (targeting =/< 130-140/90); High-intensity statin therapy (targeting LDL < 100 (optimally < 70); Anti-platelet therapy; Tobacco cessation; blood-glucose control (targeting hgb A1C <7.0).  With regard to smoking cessation, he has quit at this time, and I congratulated him on his efforts.    Further, we discussed interventional strategies as protective against recurrent stroke, including history of carotid endarterectomy (CEA) and carotid artery stenting (CAS), and the indication and role of revascularization.   We discussed clinical equipoise that is currently accepted with CEA and CAS, including CREST Trial 5-year follow up citing <3% risk for both (CREST Long Term), 5-year cumulative risk of 6.4%/CAS vs 6.5%/CEA cited by ICSS, and the equivalent 5-year stroke-free survival reported by ACT-1.  We discussed the major points of the CREST trial regarding peri-operative risk, which was determined to be no different.  This included a discussion of the individual endpoints of myocardial infarction, stroke, death.   We spoke further regarding his cardiac history, and his peri-operative risks for any surgery.     Finally, I did share my impression that he is a candidate for revascularization based on history and high grade stenosis, with the ultimate goal of minimizing peri-procedural/peri-operative stroke to achieve lowest possible risk/co-morbidity long-term.    After our discussion, and  consideration of his perceived standard surgical risk category, we offered  angiogram and possible stenting with embolic protection for left carotid critical stenosis.   Plan: - Plan for cerebral angiogram and left carotid stenting with embolic protection with Dr. Earleen Lang at [facility].  This will be with anesthesia team support.  - Continue intensive medical management, as above - He will need to continue on plavix, but must hold eliquis for 48 hours before, so we will need to reach out to his cardiology team for peri-operative clearance.         1 -  Rationale for IMM - Carlis Stable. PMID: 83382505 2 - AHA Guidelines, Kleindorfer DO, et al. PMID: 39767341 3 - CREST Long-Term Brott TG, et al. PMID: 93790240 4 - ICSS, Bonati elt al. PMID: 97353299 5 - ACT -1, Rosenfield et al.  PMID: [id number]   Electronically Signed: Corrie Mckusick 01/23/2021, 3:15 PM   I spent a total of    40 Minutes in face to face in clinical consultation, greater than 50% of which was counseling/coordinating care for cerebrovascular disease, prior right ICA/MCA stroke with thrombectomy and right ICA stenting, left ICA high grade stenosis, possible angiogram and carotid stenting

## 2021-01-24 ENCOUNTER — Encounter: Payer: Self-pay | Admitting: *Deleted

## 2021-01-28 ENCOUNTER — Other Ambulatory Visit: Payer: Self-pay | Admitting: *Deleted

## 2021-01-28 NOTE — Patient Outreach (Signed)
[name]) Care Management  [name]  01/28/2021   Christopher Lang May 29, 1955 [id number]   Outgoing call placed to member, successful.  Denies any urgent concerns, encouraged to contact this care manager with questions.    Encounter Medications:  Outpatient Encounter Medications as of 01/28/2021  Medication Sig Note   apixaban (ELIQUIS) 5 MG TABS tablet Take 1 tablet (5 mg total) by mouth 2 (two) times daily.    atorvastatin (LIPITOR) 40 MG tablet Take 1 tablet (40 mg total) by mouth daily at 6 PM.    clopidogrel (PLAVIX) 75 MG tablet Take 1 tablet (75 mg total) by mouth daily.    diphenhydrAMINE (BENADRYL) 25 MG tablet Take 50 mg by mouth at bedtime as needed for sleep. 01/06/2021: Hasnt started   Melatonin 10 MG CAPS Take 10 mg by mouth at bedtime as needed (sleep). 01/06/2021: Hasnt started   metoprolol tartrate (LOPRESSOR) 25 MG tablet Take 0.5 tablets (12.5 mg total) by mouth 2 (two) times daily.    sacubitril-valsartan (ENTRESTO) 49-51 MG Take 1 tablet by mouth 2 (two) times daily.    spironolactone (ALDACTONE) 25 MG tablet Take 0.5 tablets (12.5 mg total) by mouth daily.    varenicline (CHANTIX) 0.5 MG tablet Take 1 tablet (0.5 mg total) by mouth 2 (two) times daily.    No facility-administered encounter medications on file as of 01/28/2021.    Functional Status:  No flowsheet data found.  Fall/Depression Screening: Fall Risk  11/14/2020 03/19/2017 09/03/2016  Falls in the past year? 0 No No  Number falls in past yr: 0 - -  Injury with Fall? 0 - -   PHQ 2/9 Scores 11/14/2020 06/23/2018 03/19/2017 09/03/2016  PHQ - 2 Score 0 0 0 0    Assessment:   Care Plan Care Plan : RNCM Plan of Care (Adult)  Updates made by [name], RN since 01/28/2021 12:00 AM     Problem: Knowledge deficit related to management of chronic conditions (HTN, CHF, and A-fib) evidenced by recent stroke   Priority: High     Long-Range Goal: Management of chronic  conditions (A-fib, CHF, HTN) and recovery from stroke evidenced by no readmissions   Start Date: 11/14/2020  Expected End Date: 05/13/2021  This Visit's Progress: On track  Recent Progress: On track  Priority: High  Note:   Current Barriers:  Knowledge Deficits related to plan of care for management of Atrial Fibrillation, CHF, HTN, and Stroke Chronic Disease Management support and education needs related to Atrial Fibrillation, CHF, HTN, and Stroke  RNCM Clinical Goal(s):  Patient will verbalize understanding of plan for management of Atrial Fibrillation, CHF, HTN, and stroke as evidenced by verbalization of plan of care for adequate management take all medications exactly as prescribed and will call provider for medication related questions as evidenced by member reported adherence attend all scheduled medical appointments: Neurology, cardiology & IR as evidenced by Neurology, cardiology & IR not experience hospital admission as evidenced by review of EMR. Hospital Admissions in last 6 months = 6  through collaboration with RN Care manager, provider, and care team.   Interventions:  Inter-disciplinary care team collaboration (see longitudinal plan of care) Evaluation of current treatment plan related to  self management and patient's adherence to plan as established by provider   Hypertension Interventions: Last practice recorded BP readings:  BP Readings from Last 3 Encounters:  11/08/20 130/90  12/12/19 118/82  08/15/19 128/68  Most recent eGFR/CrCl: No results found for: EGFR  No components found for: CRCL  Evaluation of current treatment plan related to hypertension self management and patient's adherence to plan as established by provider; Provided education to patient re: stroke prevention, s/s of heart attack and stroke; Reviewed medications with patient and discussed importance of compliance; Advised patient, providing education and rationale, to monitor blood pressure  daily and record, calling PCP for findings outside established parameters;   Heart Failure Interventions: Provided education on low sodium diet; Reviewed Heart Failure Action Plan in depth and provided written copy; Assessed need for readable accurate scales in home; Provided education about placing scale on hard, flat surface; Discussed importance of daily weight and advised patient to weigh and record daily; Reviewed role of diuretics in prevention of fluid overload and management of heart failure; Discussed the importance of keeping all appointments with provider;  Update 10/27 - Report weight today is 188.1 pounds, wife state this has been stable.  AFIB Interventions:    Counseled on increased risk of stroke due to Afib and benefits of anticoagulation for stroke prevention; Reviewed importance of adherence to anticoagulant exactly as prescribed; Counseled on bleeding risk associated with Eliquis and Plavis and importance of self-monitoring for signs/symptoms of bleeding;   Update 11/15 - Member report speech therapist is currently in the home to work with him.  Has follow up appointment with neurology on 12/15 and will start cardiac rehab on 12/21.  Was seen by cardiology on 11/3, BP, HR, and weights were all stable.  No reported issues noted, no medication changes.  Will follow up with cardiology in 6 months.   Patient Goals/Self-Care Activities: Patient will self administer medications as prescribed Patient will attend all scheduled provider appointments Patient will continue to perform ADL's independently Patient will continue to perform IADL's independently  Follow Up Plan:  Telephone follow up appointment with care management team member scheduled for:  02/25/2021 The patient has been provided with contact information for the care management team and has been advised to call with any health related questions or concerns.    Update 12/13 - Report he has continued to improve well  with therapy at home, speech is much clearer today than with initial assessment.  He has follow up with neuro this Thursday and will have orientation for cardiac rehab next week.  Once orientation is complete, he will have his rehab schedule.  State that he is tolerating his increased dose of Entresto without problems (dose changed on 11/18).  He continues to monitor blood pressure, HR, and weight daily, unable to get to notebook for readings today but state they have been normal.  Does report weight was 185 pounds this morning, decreased from last outreach.  Report concern for problems with sleeping, inquires if any medications could cause this.  He is taking Chantix, side effect of insomnia.  He will discuss with provider during office visit this week.  State he also has follow up with PCP on 1/3.   Update 1/10 - Member report doing well, was seen by neurology, PCP, and IR since last outreach.  PCP increased Spironolactone to a full tablet daily instead of half due to elevated blood pressures, state it has not started to decrease.  Last reading was 140/78.  Aware that he will need carotid procedure for revascularization, waiting for IR office to call back to schedule surgery date.  He has planned to stay in hospital at least overnight, denies questions about procedure.           Plan:  Follow-up: Patient agrees to Care Plan and Follow-up. Follow-up in 1 month(s).  [name], RN, MSN, [place] Manager [phone]

## 2021-02-13 ENCOUNTER — Ambulatory Visit (INDEPENDENT_AMBULATORY_CARE_PROVIDER_SITE_OTHER): Payer: Commercial Managed Care - HMO

## 2021-02-13 DIAGNOSIS — I2102 ST elevation (STEMI) myocardial infarction involving left anterior descending coronary artery: Secondary | ICD-10-CM | POA: Diagnosis not present

## 2021-02-13 DIAGNOSIS — I350 Nonrheumatic aortic (valve) stenosis: Secondary | ICD-10-CM | POA: Diagnosis not present

## 2021-02-13 LAB — ECHOCARDIOGRAM COMPLETE
AR max vel: 2.21 cm2
AV Area VTI: 2.07 cm2
AV Area mean vel: 2.16 cm2
AV Mean grad: 5 mmHg
AV Peak grad: 7.8 mmHg
Ao pk vel: 1.4 m/s
Area-P 1/2: 3.65 cm2
Calc EF: 34 %
MV M vel: 4.32 m/s
MV Peak grad: 74.6 mmHg
Radius: 0.3 cm
S' Lateral: 4.84 cm
Single Plane A2C EF: 34.6 %
Single Plane A4C EF: 37 %

## 2021-02-14 ENCOUNTER — Other Ambulatory Visit: Payer: Self-pay

## 2021-02-14 NOTE — Patient Outreach (Signed)
[name] Care Management  02/14/2021  Christopher Lang 02/24/1955 [id number]   First telephone outreach attempt to obtain mRS. No answer. Unable to leave message for returned call.  [facility] Management Assistant [phone]

## 2021-02-18 ENCOUNTER — Other Ambulatory Visit: Payer: Self-pay

## 2021-02-18 NOTE — Patient Outreach (Signed)
[facility]) Care Management  02/18/2021  Christopher Lang [birth year] [id number]   Telephone outreach to patient to obtain mRS was successfully completed. MRS=1   [place] Care Management Assistant

## 2021-02-19 ENCOUNTER — Telehealth: Payer: Self-pay | Admitting: *Deleted

## 2021-02-19 MED ORDER — METOPROLOL SUCCINATE ER 25 MG PO TB24: 25.0000 mg | ORAL_TABLET | Freq: Every day | ORAL | 1 refills | Status: DC

## 2021-02-19 NOTE — Telephone Encounter (Signed)
Patient informed and verbalized understanding of plan. Copy sent to PCP 

## 2021-02-19 NOTE — Telephone Encounter (Signed)
-----   Message from [place], [place] sent at 02/14/2021  4:43 PM EST ----- Ordered by Jonni Sanger - Please let the patient know that his echocardiogram unfortunately shows that the pumping function of his heart remains significantly reduced at 20 to 25% (normal is 55-65%). He has mild leakage along the mitral valve but no significant valve abnormalities. He is going to require further medication adjustments for his cardiomyopathy and if EF continues to remain reduced, would need to consider EP referral for consideration of ICD placement. At this time, would recommend switching Lopressor 12.5 mg twice daily to Toprol-XL 25 mg daily. Please arrange for closer follow-up with Dr. Domenic Polite or an APP as he will need further medication adjustments and the likely addition of an SGLT2 inhibitor. Already on Spironolactone and Entresto.

## 2021-02-19 NOTE — Addendum Note (Signed)
Addended by: Merlene Laughter on: 02/19/2021 02:03 PM   Modules accepted: Orders

## 2021-02-19 NOTE — Telephone Encounter (Signed)
Please send these med changes to [facility]

## 2021-02-20 ENCOUNTER — Other Ambulatory Visit: Payer: Medicare Other

## 2021-02-20 ENCOUNTER — Ambulatory Visit: Payer: Commercial Managed Care - HMO | Admitting: Cardiology

## 2021-02-25 ENCOUNTER — Other Ambulatory Visit: Payer: Self-pay | Admitting: *Deleted

## 2021-02-25 NOTE — Patient Outreach (Signed)
[facility]) Care Management Telephonic RN Care Manager Note   02/25/2021 Name:  Christopher Lang MRN:  [id number] DOB:  16-Jul-1955  Summary: [name] call placed to member, successful.   Denies any urgent concerns, encouraged to contact this care manager with questions.    Recommendations/Changes made from today's visit: Call IR to follow up on scheduling of carotid procedure.  Subjective: Christopher Lang is an 66 y.o. year old male who is a primary patient of Hasanaj, Samul Dada, MD. The care management team was consulted for assistance with care management and/or care coordination needs.    Telephonic RN Care Manager completed Telephone Visit today.  Objective:   Medications Reviewed Today     Reviewed by Bridgette Habermann, CPhT (Pharmacy Technician) on 01/06/21 at 1159  Med List Status: Complete   Medication Order Taking? Sig Documenting Provider Last Dose Status Informant  apixaban (ELIQUIS) 5 MG TABS tablet [id number] Yes Take 1 tablet (5 mg total) by mouth 2 (two) times daily. [name], PA-C  Active Self  atorvastatin (LIPITOR) 40 MG tablet [id number] Yes Take 1 tablet (40 mg total) by mouth daily at 6 PM. [name], PA-C  Active Self  clopidogrel (PLAVIX) 75 MG tablet [id number] Yes Take 1 tablet (75 mg total) by mouth daily. [name], PA-C  Active Self  diphenhydrAMINE (BENADRYL) 25 MG tablet [id number]  Take 50 mg by mouth at bedtime as needed for sleep. [provider]  Active Self           Med Note Jilda Roche A   Mon Jan 06, 2021 11:58 AM) Hasnt started  Melatonin 10 MG CAPS [id number]  Take 10 mg by mouth at bedtime as needed (sleep). [provider]  Active Self           Med Note Jilda Roche A   Mon Jan 06, 2021 11:59 AM) Hasnt started  metoprolol tartrate (LOPRESSOR) 25 MG tablet [id number] Yes Take 0.5 tablets (12.5 mg total) by mouth 2 (two) times daily. [name], PA-C  Active Self  sacubitril-valsartan  (ENTRESTO) 49-51 MG [id number] Yes Take 1 tablet by mouth 2 (two) times daily. Verta Ellen., NP  Active Self  spironolactone (ALDACTONE) 25 MG tablet [id number] Yes Take 0.5 tablets (12.5 mg total) by mouth daily. [name], PA-C  Active Self  varenicline (CHANTIX) 0.5 MG tablet [id number] Yes Take 1 tablet (0.5 mg total) by mouth 2 (two) times daily. [name], PA-C  Active Self             SDOH:  (Social Determinants of Health) assessments and interventions performed:     Care Plan  Review of patient past medical history, allergies, medications, health status, including review of consultants reports, laboratory and other test data, was performed as part of comprehensive evaluation for care management services.   Care Plan : [facility] Plan of Care (Adult)  Updates made by [name], RN since 02/25/2021 12:00 AM     Problem: Knowledge deficit related to management of chronic conditions (HTN, CHF, and A-fib) evidenced by recent stroke   Priority: High     Long-Range Goal: Management of chronic conditions (A-fib, CHF, HTN) and recovery from stroke evidenced by no readmissions   Start Date: 11/14/2020  Expected End Date: 05/13/2021  This Visit's Progress: On track  Recent Progress: On track  Priority: High  Note:   Current Barriers:  Knowledge Deficits related to plan of care for  management of Atrial Fibrillation, CHF, HTN, and Stroke Chronic Disease Management support and education needs related to Atrial Fibrillation, CHF, HTN, and Stroke  RNCM Clinical Goal(s):  Patient will verbalize understanding of plan for management of Atrial Fibrillation, CHF, HTN, and stroke as evidenced by verbalization of plan of care for adequate management take all medications exactly as prescribed and will call provider for medication related questions as evidenced by member reported adherence attend all scheduled medical appointments: Neurology, cardiology & IR as evidenced by  Neurology, cardiology & IR not experience hospital admission as evidenced by review of EMR. Hospital Admissions in last 6 months = 6  through collaboration with RN Care manager, provider, and care team.   Interventions:  Inter-disciplinary care team collaboration (see longitudinal plan of care) Evaluation of current treatment plan related to  self management and patient's adherence to plan as established by provider   Hypertension Interventions: Last practice recorded BP readings:  BP Readings from Last 3 Encounters:  11/08/20 130/90  12/12/19 118/82  08/15/19 128/68  Most recent eGFR/CrCl: No results found for: EGFR  No components found for: CRCL  Evaluation of current treatment plan related to hypertension self management and patient's adherence to plan as established by provider; Provided education to patient re: stroke prevention, s/s of heart attack and stroke; Reviewed medications with patient and discussed importance of compliance; Advised patient, providing education and rationale, to monitor blood pressure daily and record, calling PCP for findings outside established parameters;   Heart Failure Interventions: Provided education on low sodium diet; Reviewed Heart Failure Action Plan in depth and provided written copy; Assessed need for readable accurate scales in home; Provided education about placing scale on hard, flat surface; Discussed importance of daily weight and advised patient to weigh and record daily; Reviewed role of diuretics in prevention of fluid overload and management of heart failure; Discussed the importance of keeping all appointments with provider;  Update 10/27 - Report weight today is 188.1 pounds, wife state this has been stable.  AFIB Interventions:    Counseled on increased risk of stroke due to Afib and benefits of anticoagulation for stroke prevention; Reviewed importance of adherence to anticoagulant exactly as prescribed; Counseled on  bleeding risk associated with Eliquis and Plavis and importance of self-monitoring for signs/symptoms of bleeding;   Update 11/15 - Member report speech therapist is currently in the home to work with him.  Has follow up appointment with neurology on 12/15 and will start cardiac rehab on 12/21.  Was seen by cardiology on 11/3, BP, HR, and weights were all stable.  No reported issues noted, no medication changes.  Will follow up with cardiology in 6 months.   Patient Goals/Self-Care Activities: Patient will self administer medications as prescribed Patient will attend all scheduled provider appointments Patient will continue to perform ADL's independently Patient will continue to perform IADL's independently  Follow Up Plan:  Telephone follow up appointment with care management team member scheduled for:  03/06/2021 The patient has been provided with contact information for the care management team and has been advised to call with any health related questions or concerns.    Update 12/13 - Report he has continued to improve well with therapy at home, speech is much clearer today than with initial assessment.  He has follow up with neuro this Thursday and will have orientation for cardiac rehab next week.  Once orientation is complete, he will have his rehab schedule.  State that he is tolerating his increased dose  of Entresto without problems (dose changed on 11/18).  He continues to monitor blood pressure, HR, and weight daily, unable to get to notebook for readings today but state they have been normal.  Does report weight was 185 pounds this morning, decreased from last outreach.  Report concern for problems with sleeping, inquires if any medications could cause this.  He is taking Chantix, side effect of insomnia.  He will discuss with provider during office visit this week.  State he also has follow up with PCP on 1/3.   Update 1/10 - Member report doing well, was seen by neurology, PCP, and IR  since last outreach.  PCP increased Spironolactone to a full tablet daily instead of half due to elevated blood pressures, state it has not started to decrease.  Last reading was 140/78.  Aware that he will need carotid procedure for revascularization, waiting for IR office to call back to schedule surgery date.  He has planned to stay in hospital at least overnight, denies questions about procedure.    Update 2/7 - Spoke to member, has been seen by cardiology for Echo, EF remains 20-25%.  Will have medication changes (Metoprolol to Toprol XL), waiting for this to be delivered, cardiology follow up scheduled for 3/1.  There has been discussion regarding ICD placement if condition does not improve.  Weight has been stable, today was 184 pounds. Report blood pressure range 140-150/70-80 and HR range 40-50's.  He was scheduled to have cardiac rehab orientation however decision was made to wait until after carotid procedure.  He is still waiting on call to schedule.  Call was place to IR, spoke with Olegario Shearer, state she requested Zacarias Pontes scheduler, Anderson Malta, to call for date.  Call placed to Anderson Malta [phone]), message left to call this care manager and/or member directly.        Plan:  Telephone follow up appointment with care management team member scheduled for:  2 weeks The patient has been provided with contact information for the care management team and has been advised to call with any health related questions or concerns.   [name], RN, MSN, [place] Manager [phone]

## 2021-02-28 ENCOUNTER — Other Ambulatory Visit (HOSPITAL_COMMUNITY): Payer: Self-pay | Admitting: Radiology

## 2021-02-28 DIAGNOSIS — I6522 Occlusion and stenosis of left carotid artery: Secondary | ICD-10-CM

## 2021-03-03 ENCOUNTER — Telehealth (HOSPITAL_COMMUNITY): Payer: Self-pay

## 2021-03-03 ENCOUNTER — Telehealth: Payer: Self-pay | Admitting: Cardiology

## 2021-03-03 ENCOUNTER — Other Ambulatory Visit: Payer: Self-pay | Admitting: Interventional Radiology

## 2021-03-03 LAB — SARS CORONAVIRUS 2 (TAT 6-24 HRS): SARS Coronavirus 2: NEGATIVE

## 2021-03-03 NOTE — Telephone Encounter (Signed)
° °  Patient Name: Christopher Lang  DOB: 31-May-1955 MRN: [id number]  Primary Cardiologist: Rozann Lesches, MD  Chart reviewed as part of pre-operative protocol coverage.   Given complex history, I reviewed further with Dr. Domenic Polite who replied, " Medically complex patient, I reviewed the chart.  Given his comorbid illnesses and substrate, he is a high risk patient for invasive surgical procedures in general.  Having said that, a left carotid stent would not fall into a high risk vascular surgical category and would be an option that he could consider.  He should keep follow-up office visit as scheduled to continue ongoing efforts at medical therapy optimization.  Pursuing a Myoview evaluation at this point would not appreciably alter his periprocedural risk, almost certainly would be abnormal, and the question of whether he would potentially benefit from cardiac revascularization is an entirely different question unrelated to his ability to have a left carotid stent implanted.  It may be that he completed his infarct during prior hospitalization." Per further clarification in secure chat, Dr. Domenic Polite feels he may proceed with procedure as requested, that he is likely going to be risky no matter what we do, and delaying procedure will not do him much good. Our team will keep intended follow-up for 3/1 as the bigger question remains whether he needs cardiac revascularization or just medical therapy adjustment as more of a long-range plan.  I reached out to patient for update on how he is doing. The patient affirms he has been doing well without any new cardiac symptoms. Therefore, based on ACC/AHA guidelines, the patient would be at acceptable risk for the planned procedure without further cardiovascular testing. The patient was advised that if he develops new symptoms prior to surgery to contact our office to arrange for a follow-up visit, and he verbalized understanding.  Per pharmD, Per office protocol,  patient can hold Eliquis for 2 days prior to procedure.   Patient will not need bridging with Lovenox (enoxaparin) around procedure. I informed patient of this recommendation and he verbalized understanding.  Will route this bundled recommendation to requesting provider via Epic fax function. Please call with questions.   Charlie Pitter, PA-C 03/03/2021, 5:01 PM

## 2021-03-03 NOTE — Telephone Encounter (Signed)
° °  Patient Name: Christopher Lang  DOB: [birth year] MRN: [id number]  Primary Cardiologist: Rozann Lesches, MD  Chart reviewed as part of pre-operative protocol coverage. Patient was last seen by Levell July NP on 11/2020 with complex hx to include CVA, CAD, STEMI, COPD, HTN, HLD, OSA, atrial fibrillation. Per notes, had "Recent admission on 11/03/2020 with acute right MCA stroke due to right M1 occlusion status post TNKase and IR with TICI 3 revascularization, acute ST elevation MI.  He presented with sudden onset of left-sided weakness was nausea and dry heaving the prior day.  He got up the next morning early with some chest pain and feeling flushed.  By the time EMS arrived he developed left-sided weakness.  EKG per EMS with anterior STEMI." He was evaluated in the ED and deemed not to be a candidate for invasive cath given his acute CVA s/p TNK. Troponin >24k, EF 20-25% with akinetic apex. Discharged on Eliquis + Plavix amongst other medical therapy. Per last OV 11/2020, required walker for ambulation. Recent repeat echo 02/13/21 showed continued LV dysfunction with EF 20-25%, recommended close follow-up for medication adjustment and med titration with possible referral to EP to consider ICD. Given complexity of history and non-revascularized patient with ambulatory dysfunction, unable to clear by traditional measures therefore will involve Dr. Domenic Polite in decision making for medical clearance for procedure, will also route to pharm for input on Eliquis. Dr. Domenic Polite - Please route response to P CV DIV PREOP (the pre-op pool). Thank you.  Patient has OV scheduled 3/1 with Cecilie Kicks, NP.   Charlie Pitter, PA-C 03/03/2021, 3:04 PM

## 2021-03-03 NOTE — Telephone Encounter (Signed)
° °  Pre-operative Risk Assessment    Patient Name: Christopher Lang  DOB: [birth year] MRN: [id number]      Request for Surgical Clearance    Procedure:  Left Carotid Stent  Date of Surgery:  Clearance 03/06/21                                  Surgeon:  Dr. Damita Dunnings Surgeon's Group or Practice Name:  [facility] Phone number:  [phone] Fax number:  [phone]   Type of Clearance Requested:  Pharmacy- Hold Eliquis 48 hrs.    Type of Anesthesia:  Not Indicated   Additional requests/questions:   Gertha Calkin   03/03/2021, 2:18 PM

## 2021-03-03 NOTE — Telephone Encounter (Signed)
Patient with diagnosis of atrial fibrillation on Eliquis for anticoagulation.    Procedure: left carotid stent Date of procedure: 03/06/21   CHA2DS2-VASc Score = 5   This indicates a 7.2% annual risk of stroke. The patient's score is based upon: CHF History: 0 HTN History: 1 Diabetes History: 0 Stroke History: 2 Vascular Disease History: 1 Age Score: 1 Gender Score: 0  Per chart patient had acute right MCA stroke due to right M1 occlusion 11/03/2020.   Dr. Domenic Polite has reviewed chart and is aware of risk and need for hold  CrCl 90 Platelet count 137  Per office protocol, patient can hold Eliquis for 2 days prior to procedure.   Patient will not need bridging with Lovenox (enoxaparin) around procedure.

## 2021-03-03 NOTE — Telephone Encounter (Signed)
Called to inform patient that he does not need to come to [facility] for p2y12 per Dr. Earleen Newport, he will only need to go to get his COVID test. AW

## 2021-03-04 ENCOUNTER — Other Ambulatory Visit: Payer: Self-pay

## 2021-03-04 ENCOUNTER — Telehealth: Payer: Self-pay | Admitting: Cardiology

## 2021-03-04 ENCOUNTER — Encounter (HOSPITAL_COMMUNITY): Payer: Self-pay | Admitting: Interventional Radiology

## 2021-03-04 ENCOUNTER — Other Ambulatory Visit: Payer: Self-pay | Admitting: Pharmacist

## 2021-03-04 MED ORDER — APIXABAN 5 MG PO TABS: 5.0000 mg | ORAL_TABLET | Freq: Two times a day (BID) | ORAL | 5 refills | Status: DC

## 2021-03-04 NOTE — Progress Notes (Signed)
Mr. Mccauley denies chest pain or shortness of breath at this time.  Mr. Loughmiller does get `short of breath with exertion. Patient denies having any s/s of Covid in his household.  Patient denies any known exposure to Covid. Mr. Ostermiller tested negative for Covid on 03/03/21.  Mr. Durr reports that he has a little bit of weakness in left leg, he is able to walk without any assistive devices.  Mr. Seabrook stated that he took last doses of Eliquis and Plavix last evening. I read Dr. Pasty Arch note on 01/23/21 which said hold Eliquis 48 hours and continue Plavix, I notified Mr. Old of this instructions, patient voice understanding.  PCR is Durenda Age Cardiologist is Dr. Rozann Lesches.  I instructed Mr. Jupin to shower with antibiotic soap, if it is available.  Dry off with a clean towel. Do not put lotion, powder, cologne or deodorant or makeup.No jewelry or piercings. Men may shave their face and neck. Woman should not shave. No nail polish, artificial or acrylic nails. Wear clean clothes, brush your teeth. Glasses, contact lens,dentures or partials may not be worn in the OR. If you need to wear them, please bring a case for glasses, do not wear contacts or bring a case, the hospital does not have contact cases, dentures or partials will have to be removed , make sure they are clean, we will provide a denture cup to put them in. You will need some one to drive you home and a responsible person over the age of 19 to stay with you for the first 24 hours after surgery.   I've asked anesthesiologist PA-C to review chart.

## 2021-03-04 NOTE — Telephone Encounter (Signed)
Pharmacy, can you please comment on how long Eliquis can be held for upcoming procedure?  Thank you! 

## 2021-03-04 NOTE — Telephone Encounter (Signed)
° °  Pre-operative Risk Assessment    Patient Name: Christopher Lang  DOB: Jan 13, 1956 MRN: [id number]      Request for Surgical Clearance    Procedure:   left carotid stent   Date of Surgery:  Clearance TBD                                 Surgeon:  Dr. Liana Crocker Surgeon's Group or Practice Name:  [facility]  Phone number:  [phone] Fax number:  [phone]   Type of Clearance Requested:   - Pharmacy:  Hold Apixaban (Eliquis) 48 hours   Type of Anesthesia:  Not Indicated   Additional requests/questions:    Sandrea Hammond   03/04/2021, 1:05 PM

## 2021-03-05 ENCOUNTER — Other Ambulatory Visit: Payer: Self-pay | Admitting: Student

## 2021-03-05 ENCOUNTER — Encounter (HOSPITAL_COMMUNITY): Payer: Self-pay | Admitting: Interventional Radiology

## 2021-03-05 ENCOUNTER — Other Ambulatory Visit: Payer: Self-pay | Admitting: Radiology

## 2021-03-05 NOTE — Progress Notes (Signed)
Anesthesia Chart Review: Christopher Lang   Case: [id number] Date/Time: 03/06/21 0815   Procedure: CAROTID STENT (Left)   Anesthesia type: General   Pre-op diagnosis: STENOSIS   Location: [facility] OR RADIOLOGY ROOM / [facility]   Surgeons: Corrie Mckusick, DO       DISCUSSION: Patient is a 66 year old male scheduled for cerebral angiogram and left carotid stenting with embolic protection with Dr. Earleen Newport at [facility].    History includes former smoker (quit 11/03/20), COPD, HTN, HLD, afib (12/02/15), CAD (anterolateral STEMI 11/03/20, treated medically due to co-diagnosis of acute CVA), CHF (acute systolic CHF 19/14/78 in setting of STEMI), ischemic cardiomyopathy, murmur (mild MR, AV sclerosis w/o stenosis 02/13/21 echo), PVD, dyspnea, CVA (right MCA infarct with M1 occlusion 11/03/20, s/p TNK, mechanical thrombectomy M1; mild residula left sided weakness), carotid artery stenosis (s/p right carotid stent 11/06/20), OSA (intolerant to CPAP).   - [facility]) admission 11/03/20 - 11/08/20 for CODE STEMI and CODE STROKE. EMS called around 2:30 AM for chest pain, and by the time he arrived he had also developed left sided weakness. EKG showed anterolateral STEMI. Imaging showed no ICH or aortic dissection, but with right MCA occlusion. Troponins > 24K. Not a candidate for emergent LHC due to acute CVA. CVA treated with TNK and IR with TICI3 revascularization 11/03/20. Also with severe bilateral ICA stenosis, s/p right carotid stent 11/06/20 with plans for left carotid revascularization at a later date.  Echo showed new LV dysfunction with EF 20-25%, akinesis of the entire apex, no LV thrombus. Discharged on Eliquis, Plavix, Entresto, spironolactone, low dose metoprolol (12.5 mg due to bradycardia).   He had IR follow-up with Dr. Earleen Newport on 01/23/21. Patient felt recovered from his previous events.  He had known contralateral asymptomatic carotid artery disease with high-grade stenosis of the left ICA. Revascularization  recommended for reduction of CVA risk. Following discussion, decision to proceed with cerebral angiogram and left carotid stenting with embolic protection. Per Dr. Earleen Newport, "He will need to continue on plavix, but must hold eliquis for 48 hours before."  Preoperative cardiology input outline by Melina Copa, PA-C on 03/03/21: "Given complex history, I reviewed further with Dr. Domenic Polite who replied, 'Medically complex patient, I reviewed the chart.  Given his comorbid illnesses and substrate, he is a high risk patient for invasive surgical procedures in general.  Having said that, a left carotid stent would not fall into a high risk vascular surgical category and would be an option that he could consider.  He should keep follow-up office visit as scheduled to continue ongoing efforts at medical therapy optimization.  Pursuing a Myoview evaluation at this point would not appreciably alter his periprocedural risk, almost certainly would be abnormal, and the question of whether he would potentially benefit from cardiac revascularization is an entirely different question unrelated to his ability to have a left carotid stent implanted.  It may be that he completed his infarct during prior hospitalization.' Per further clarification in secure chat, Dr. Domenic Polite feels he may proceed with procedure as requested, that he is likely going to be risky no matter what we do, and delaying procedure will not do him much good. Our team will keep intended follow-up for 3/1 as the bigger question remains whether he needs cardiac revascularization or just medical therapy adjustment as more of a long-range plan.   I reached out to patient for update on how he is doing. The patient affirms he has been doing well without any new cardiac symptoms.  Therefore, based on ACC/AHA guidelines, the patient would be at acceptable risk for the planned procedure without further cardiovascular testing. The patient was advised that if he develops new  symptoms prior to surgery to contact our office to arrange for a follow-up visit, and he verbalized understanding.   Per pharmD, Per office protocol, patient can hold Eliquis for 2 days prior to procedure.   Patient will not need bridging with Lovenox (enoxaparin) around procedure. I informed patient of this recommendation and he verbalized understanding."  Per 03/04/21 PAT RN phone note, she reviewed Dr. Pasty Arch recommendation to hold Eliquis for 48 hours before procedure and continue Plavix.  03/03/2021 preprocedure COVID-19 test negative.  Anesthesia team to evaluate on the day of procedure.    VS:  BP Readings from Last 3 Encounters:  01/23/21 (!) 164/87  01/02/21 (!) 178/81  11/21/20 130/80   Pulse Readings from Last 3 Encounters:  01/23/21 75  01/02/21 80  11/21/20 (!) 58      PROVIDERS: Neale Burly, MD is PCP  - Rozann Lesches, MD is cardiologist - Antony Contras, MD is neurologist. Last visit 01/02/21.  He recommended to maintain aggressive risk factor modification, continue Eliquis and Plavix for stroke prevention, and follow-up with Dr. Earleen Newport for consideration of elective left carotid revascularization - Corrie Mckusick, DO is IR   LABS: For day of procedure as indicated. Last results include: Lab Results  Component Value Date   WBC 9.0 11/08/2020   HGB 12.4 (L) 11/08/2020   HCT 35.8 (L) 11/08/2020   PLT 137 (L) 11/08/2020   GLUCOSE 103 (H) 12/19/2020   ALT 30 11/03/2020   AST 83 (H) 11/03/2020   NA 140 12/19/2020   K 4.0 12/19/2020   CL 101 12/19/2020   CREATININE 0.90 12/19/2020   BUN 9 12/19/2020   CO2 22 12/19/2020   INR 1.0 11/03/2020   HGBA1C 4.9 11/03/2020     IMAGES: MRI/MRA Brain 11/04/20 (post CVA): IMPRESSION: 1. Patchy acute cerebral infarcts on the right more than left with right-sided petechial hemorrhage. 2. No residual or recurrent right MCA thrombus. 3. No detected flow in the intracranial left ICA but well compensated by the left  posterior communicating artery. Chronic critical stenosis with underfilling of the left ICA bulb.   Carotid Angiogram with Right carotid stent 11/06/20: Completion:  - After angioplasty and stenting there is approximately 30% residual  stenosis.  - Right MCA: M1 segment patent. Insular and opercular segments patent.  Unremarkable caliber and course of the cortical segments. Typical  arterial, capillary/ parenchymal, and venous phase.  - Right ACA: A 1 segment patent. A 2 segment perfuses the right  territory. Patent anterior communicating artery rapidly fills the  left hemispheric MCA branches  IMPRESSION:  - Status post ultrasound guided access right common femoral artery for  right cervical ICA stenting with embolic protection, for treatment  of high-grade symptomatic stenosis, high risk for surgery.  - Angiogram demonstrates a left cervical ICA critical stenosis.      Right Cervical and Cerebral angiogram/IR PTA 11/03/20:  IMPRESSION: Status post ultrasound guided access right common femoral artery for right-sided cervical and cerebral angiogram, balloon angioplasty of high-grade right ICA stenosis and mechanical thrombectomy of right M1 occlusion, achieving TICI 3 flow.    CTA Head & Neck 11/03/20: IMPRESSION: 1. Emergent large vessel occlusion at the right M1 segment. 2. Severe bilateral ICA origin stenosis with underfilling left. 3. Thready intermittent flow in the non dominant left vertebral artery. 4. 60%  left subclavian origin stenosis.  1V PCXR 11/03/20 (in setting of STEMI/CVA): IMPRESSION: 1. Endotracheal and enteric tubes appear well positioned. 2. Central pulmonary vascular congestion and coarse interstitial markings throughout both lungs, most likely interstitial edema superimposed on chronic interstitial lung disease. 3. Probable atelectasis at the RIGHT lung base.   EKG: Last EKGs on 11/03/20 and 11/04/20 were in setting of acute STEMI.  Would plan for  updated EKG on the day of procedure.    CV: Echo 02/13/21: IMPRESSIONS   1. The mid to distal anteroseptal, anterior, anterolateral inferoseptal  walls are akinetic. The apex is akinetic. Marland Kitchen Left ventricular ejection  fraction, by estimation, is 20 to 25%. The left ventricle has severely  decreased function. The left ventricle  demonstrates regional wall motion abnormalities (see scoring  diagram/findings for description). Left ventricular diastolic parameters  are consistent with Grade II diastolic dysfunction (pseudonormalization).  Elevated left atrial pressure.   2. Right ventricular systolic function is normal. The right ventricular  size is normal. Tricuspid regurgitation signal is inadequate for assessing  PA pressure.   3. Left atrial size was mildly dilated.   4. The mitral valve is abnormal. Mild mitral valve regurgitation. No  evidence of mitral stenosis.   5. The aortic valve is tricuspid. There is moderate calcification of the  aortic valve. There is moderate thickening of the aortic valve. Aortic  valve regurgitation is not visualized. No aortic stenosis is present.   6. The inferior vena cava is normal in size with greater than 50%  respiratory variability, suggesting right atrial pressure of 3 mmHg.  - Comparison(s): LVEF 20-25%.  - Comparison LVEF 60-65% 12/03/15 & 06/07/19; LVEF 20-25% with mid-distal septum/anterior segment akinesis, entire apex akinetic in setting of anterolateral STEMI 11/03/20)   Carotid US 11/04/20:  Summary:  - Right Carotid: Velocities in the right ICA are consistent with a 80-99%  stenosis. The ECA appears >50% stenosed.  - Left Carotid: The ECA appears >50% stenosed. Unable to evaluate proximal  ICA  secondary to severe calcification/acoustic shadowing.  Recommend : correlate with CT angiogram.  - Vertebrals:  Bilateral vertebral arteries demonstrate antegrade flow.  - Subclavians: Normal flow hemodynamics were seen in bilateral subclavian   arteries.  - S/p right carotid stent 11/06/20.   Past Medical History:  Diagnosis Date   Acute CVA (cerebrovascular accident) ([place]) 11/03/2020   Acute left-sided weakness 11/03/2020   Acute ST elevation myocardial infarction (STEMI) ([place]) 11/03/2020   Cataract    Removed Dec 2017/Jan 2018   CHF (congestive heart failure) ([facility])    COPD (chronic obstructive pulmonary disease) ([facility])    Coronary artery disease    Dyspnea    Essential hypertension    H. pylori infection 11/2016   With documented eradication via biopsy Feb 2019   Heart murmur    History of atrial fibrillation    November 2017   Hyperlipidemia    Peripheral vascular disease ([place])    Sleep apnea    Did not tolerate CPAP   Tubulovillous adenoma of rectum 04/2010    Past Surgical History:  Procedure Laterality Date   CATARACT EXTRACTION W/PHACO Right 01/16/2016   Procedure: CATARACT EXTRACTION PHACO AND INTRAOCULAR LENS PLACEMENT RIGHT EYE CDE= 6.97;  Surgeon: Tonny Branch, MD;  Location: [facility];  Service: Ophthalmology;  Laterality: Right;  right   CATARACT EXTRACTION W/PHACO Left 01/30/2016   Procedure: CATARACT EXTRACTION PHACO AND INTRAOCULAR LENS PLACEMENT LEFT EYE CDE 3.13;  Surgeon: Tonny Branch, MD;  Location: [facility];  Service: Ophthalmology;  Laterality: Left;  left   COLONOSCOPY  2012   COLONOSCOPY N/A 12/04/2016   three 4-6 mm tubular adenomas, moderate size external and internal hemorrhoids, redundant left colon, 3 year surveillance   ESOPHAGOGASTRODUODENOSCOPY N/A 12/04/2016   Dr. Oneida Alar: normal esophagus, small hiatal hernia, chronic gastritis with H.pylori, peptic duodenitis, atypical lymphoid proliferation. Surveillance EGD Feb 2019 without abnormal cells, negative H.pylori   ESOPHAGOGASTRODUODENOSCOPY N/A 03/15/2017   mild gastritis, no atypical cells, negative H.pylori   EXCISION, SKI  08/2016   R THIGH   IR ANGIO INTRA EXTRACRAN SEL COM CAROTID INNOMINATE BILAT MOD SED  11/06/2020   IR CT HEAD LTD   11/03/2020   IR INTRAVSC STENT CERV CAROTID W/EMB-PROT MOD SED INCL ANGIO  11/06/2020   IR PERCUTANEOUS ART THROMBECTOMY/INFUSION INTRACRANIAL INC DIAG ANGIO  11/03/2020   IR PTA INTRACRANIAL  11/04/2020   IR RADIOLOGIST EVAL & MGMT  01/23/2021   IR US GUIDE VASC ACCESS LEFT  11/03/2020   IR US GUIDE VASC ACCESS RIGHT  11/03/2020   IR US GUIDE VASC ACCESS RIGHT  11/06/2020   RADIOLOGY WITH ANESTHESIA N/A 11/03/2020   Procedure: RADIOLOGY WITH ANESTHESIA;  Surgeon: Radiologist, Medication, MD;  Location: [place];  Service: Radiology;  Laterality: N/A;   RADIOLOGY WITH ANESTHESIA N/A 11/06/2020   Procedure: STENTING;  Surgeon: Corrie Mckusick, DO;  Location: [place];  Service: Anesthesiology;  Laterality: N/A;    MEDICATIONS: No current facility-administered medications for this encounter.    atorvastatin (LIPITOR) 40 MG tablet   clopidogrel (PLAVIX) 75 MG tablet   metoprolol succinate (TOPROL XL) 25 MG 24 hr tablet   sacubitril-valsartan (ENTRESTO) 49-51 MG   spironolactone (ALDACTONE) 25 MG tablet   varenicline (CHANTIX) 0.5 MG tablet   apixaban (ELIQUIS) 5 MG TABS tablet   Melatonin 10 MG CAPS    Myra Gianotti, PA-C Surgical Short Stay/Anesthesiology [facility] Phone [phone] [facility] Phone [phone] 03/05/2021 1:51 PM

## 2021-03-05 NOTE — Anesthesia Preprocedure Evaluation (Addendum)
Anesthesia Evaluation  Patient identified by MRN, date of birth, ID band Patient awake    Reviewed: Allergy & Precautions, NPO status , Patient's Chart, lab work & pertinent test results, reviewed documented beta blocker date and time   History of Anesthesia Complications Negative for: history of anesthetic complications  Airway Mallampati: I  TM Distance: >3 FB Neck ROM: Full    Dental  (+) Edentulous Upper, Edentulous Lower   Pulmonary sleep apnea (does not use his CPAP) , COPD, Patient abstained from smoking., former smoker,  03/03/2021 SARS coronavirus NEG   breath sounds clear to auscultation       Cardiovascular hypertension, Pt. on medications and Pt. on home beta blockers (-) angina+ CAD, + Past MI, + Peripheral Vascular Disease and +CHF (Entresto)  + dysrhythmias Atrial Fibrillation  Rhythm:Irregular Rate:Normal  Echo 02/13/21: IMPRESSIONS  1. The mid to distal anteroseptal, anterior, anterolateral inferoseptal  walls are akinetic. The apex is akinetic. Marland Kitchen Left ventricular ejection  fraction, by estimation, is 20 to 25%. The left ventricle has severely  decreased function. The left ventricle  demonstrates regional wall motion abnormalities (see scoring  diagram/findings for description). Left ventricular diastolic parameters  are consistent with Grade II diastolic dysfunction (pseudonormalization).  Elevated left atrial pressure.  2. Right ventricular systolic function is normal. The right ventricular  size is normal. Tricuspid regurgitation signal is inadequate for assessing  PA pressure.  3. Left atrial size was mildly dilated.  4. The mitral valve is abnormal. Mild mitral valve regurgitation. No  evidence of mitral stenosis.  5. The aortic valve is tricuspid. There is moderate calcification of the  aortic valve. There is moderate thickening of the aortic valve. Aortic  valve regurgitation is not visualized. No  aortic stenosis is present.  6. The inferior vena cava is normal in size with greater than 50%  respiratory variability, suggesting right atrial pressure of 3 mmHg.  - Comparison(s): LVEF 20-25%.    Neuro/Psych CVA (L sided weakness), Residual Symptoms    GI/Hepatic negative GI ROS, Neg liver ROS,   Endo/Other  negative endocrine ROS  Renal/GU      Musculoskeletal   Abdominal   Peds  Hematology eliquis   Anesthesia Other Findings   Reproductive/Obstetrics                           Anesthesia Physical Anesthesia Plan  ASA: 4  Anesthesia Plan: General   Post-op Pain Management: Minimal or no pain anticipated   Induction: Intravenous  PONV Risk Score and Plan: 2 and Ondansetron and Dexamethasone  Airway Management Planned: Oral ETT  Additional Equipment: Arterial line  Intra-op Plan:   Post-operative Plan: Extubation in OR  Informed Consent: I have reviewed the patients History and Physical, chart, labs and discussed the procedure including the risks, benefits and alternatives for the proposed anesthesia with the patient or authorized representative who has indicated his/her understanding and acceptance.     Dental advisory given  Plan Discussed with: CRNA and Surgeon  Anesthesia Plan Comments: (See PAT note written 03/05/2021 by Myra Gianotti, PA-C. Presentation 11/03/20 with acute anterolateral STEMI and acute CVA. No a candidate for LHC due to acute CVA. S/p M1 thrombectomy 11/03/20, right carotid stent 11/06/20. Ischemic CM, EF 20-25% managed medically. Cardiologist Dr. Domenic Polite provided preoperative input: "Medically complex patient, I reviewed the chart.  Given his comorbid illnesses and substrate, he is a high risk patient for invasive surgical procedures in general.  Having  said that, a left carotid stent would not fall into a high risk vascular surgical category and would be an option that he could consider.  He should keep  follow-up office visit as scheduled to continue ongoing efforts at medical therapy optimization.  Pursuing a Myoview evaluation at this point would not appreciably alter his periprocedural risk, almost certainly would be abnormal, and the question of whether he would potentially benefit from cardiac revascularization is an entirely different question unrelated to his ability to have a left carotid stent implanted.  It may be that he completed his infarct during prior hospitalization." )      Anesthesia Quick Evaluation

## 2021-03-05 NOTE — Telephone Encounter (Signed)
Duplicate request, pt already cleared 2/13 phone note. Procedure is tomorrow.

## 2021-03-05 NOTE — Telephone Encounter (Signed)
I left a detailed message that we have faxed clearance on 03/03/21. I will re-fax clearance notes today as well.

## 2021-03-05 NOTE — H&P (Signed)
Chief Complaint: Patient was seen in consultation today for left ICA stenosis   Referring Physician(s): [place]  Supervising Physician: Corrie Mckusick  Patient Status: [facility] - Out-pt  History of Present Illness: Christopher Lang is a 66 y.o. male with a medical history significant for OSA, COPD, tobacco use, HTN and paroxysmal atrial fibrillation. He was admitted to [facility] 11/03/20 with sudden onset left-sided weakness, chest pain and nausea. He was found to have an anterior STEMI and large vessel occlusion of the right M1 segement with severe bilateral ICA  origin stenosis and 60% left subclavian stenosis. He was given tNKase by neurology and underwent right M1 mechanical thrombectomy as well as PTA of the high grade symptomatic stenosis of the right ICA bifurcation 11/03/20 in IR. The patient was noted to have 80-99% right ICA re-stenosis on follow up carotid US and the patient presented to IR for right ICA angioplasty with stent  placement 11/06/20.   At that time IR was unable to fully assess the left ICA stenosis due to severe calcification. The patient was discharged home 11/08/20 with outpatient follow up and he met with Dr. Earleen Newport 01/23/21 to discuss the previously treated right ICA stenosis and possible treatment options for the left ICA stenosis.   CTA during his October admission demonstrated critically left ICA stenosis/string sign. Duplex did not measure an elevated velocity at the bifurcation, as there was calcified plaque obscuring the measurement. Beyond the bifurcation, there was spectral broadening and parvus tardus waveform compatible with the high grade stenosis on the CTA.   Dr. Earleen Newport informed the patient that he was a candidate for revascularization of the left ICA and they discussed the risks/benefits of this option versus continued medical management. The patient elected to pursue revascularization which he knows will be done with general anesthesia and planned overnight  observation.   Past Medical History:  Diagnosis Date   Acute CVA (cerebrovascular accident) ([place]) 11/03/2020   Acute left-sided weakness 11/03/2020   Acute ST elevation myocardial infarction (STEMI) ([place]) 11/03/2020   Carotid artery disease [facility])    Cataract    Removed Dec 2017/Jan 2018   CHF (congestive heart failure) (H[facility])    COPD (chronic obstructive pulmonary disease) (H[facility])    Coronary artery disease    Dyspnea    Essential hypertension    H. pylori infection 11/2016   With documented eradication via biopsy Feb 2019   Heart murmur    History of atrial fibrillation    November 2017   Hyperlipidemia    Peripheral vascular disease ([place])    Sleep apnea    Did not tolerate CPAP   Tubulovillous adenoma of rectum 04/2010    Past Surgical History:  Procedure Laterality Date   CATARACT EXTRACTION W/PHACO Right 01/16/2016   Procedure: CATARACT EXTRACTION PHACO AND INTRAOCULAR LENS PLACEMENT RIGHT EYE CDE= 6.97;  Surgeon: Tonny Branch, MD;  Location: [facility];  Service: Ophthalmology;  Laterality: Right;  right   CATARACT EXTRACTION W/PHACO Left 01/30/2016   Procedure: CATARACT EXTRACTION PHACO AND INTRAOCULAR LENS PLACEMENT LEFT EYE CDE 3.13;  Surgeon: Tonny Branch, MD;  Location: [facility];  Service: Ophthalmology;  Laterality: Left;  left   COLONOSCOPY  2012   COLONOSCOPY N/A 12/04/2016   three 4-6 mm tubular adenomas, moderate size external and internal hemorrhoids, redundant left colon, 3 year surveillance   ESOPHAGOGASTRODUODENOSCOPY N/A 12/04/2016   Dr. Oneida Alar: normal esophagus, small hiatal hernia, chronic gastritis with H.pylori, peptic duodenitis, atypical lymphoid proliferation. Surveillance EGD Feb 2019 without abnormal  cells, negative H.pylori   ESOPHAGOGASTRODUODENOSCOPY N/A 03/15/2017   mild gastritis, no atypical cells, negative H.pylori   EXCISION, SKI  08/2016   R THIGH   IR ANGIO INTRA EXTRACRAN SEL COM CAROTID INNOMINATE BILAT MOD SED  11/06/2020   IR CT HEAD LTD   11/03/2020   IR INTRAVSC STENT CERV CAROTID W/EMB-PROT MOD SED INCL ANGIO  11/06/2020   IR PERCUTANEOUS ART THROMBECTOMY/INFUSION INTRACRANIAL INC DIAG ANGIO  11/03/2020   IR PTA INTRACRANIAL  11/04/2020   IR RADIOLOGIST EVAL & MGMT  01/23/2021   IR US GUIDE VASC ACCESS LEFT  11/03/2020   IR US GUIDE VASC ACCESS RIGHT  11/03/2020   IR US GUIDE VASC ACCESS RIGHT  11/06/2020   RADIOLOGY WITH ANESTHESIA N/A 11/03/2020   Procedure: RADIOLOGY WITH ANESTHESIA;  Surgeon: Radiologist, Medication, MD;  Location: [facility];  Service: Radiology;  Laterality: N/A;   RADIOLOGY WITH ANESTHESIA N/A 11/06/2020   Procedure: STENTING;  Surgeon: Corrie Mckusick, DO;  Location: [place];  Service: Anesthesiology;  Laterality: N/A;    Allergies: Patient has no known allergies.  Medications: Prior to Admission medications   Medication Sig Start Date End Date Taking? Authorizing Provider  apixaban (ELIQUIS) 5 MG TABS tablet Take 1 tablet (5 mg total) by mouth 2 (two) times daily. 03/04/21   Satira Sark, MD  atorvastatin (LIPITOR) 40 MG tablet Take 1 tablet (40 mg total) by mouth daily at 6 PM. 11/08/20   [name], PA-C  clopidogrel (PLAVIX) 75 MG tablet Take 1 tablet (75 mg total) by mouth daily. 11/09/20   [name], PA-C  Melatonin 10 MG CAPS Take 10 mg by mouth at bedtime as needed (sleep).    [provider]  metoprolol succinate (TOPROL XL) 25 MG 24 hr tablet Take 1 tablet (25 mg total) by mouth daily. 02/19/21   Strader, Fransisco Hertz, PA-C  sacubitril-valsartan (ENTRESTO) 49-51 MG Take 1 tablet by mouth 2 (two) times daily. 12/06/20   Verta Ellen., NP  spironolactone (ALDACTONE) 25 MG tablet Take 0.5 tablets (12.5 mg total) by mouth daily. Patient taking differently: Take 25 mg by mouth daily. 11/09/20   [name], PA-C  varenicline (CHANTIX) 0.5 MG tablet Take 1 tablet (0.5 mg total) by mouth 2 (two) times daily. 11/08/20   [name], PA-C     Family History   Problem Relation Age of Onset   CAD Mother    High blood pressure Mother    Heart attack Mother    Leukemia Father    Early death Father    Heart attack Sister    Diabetes Sister    Cancer Brother    Deep vein thrombosis Brother    Hypertension Daughter    Stroke Sister    Stroke Sister    Heart attack Sister    Deep vein thrombosis Sister    Hypertension Sister    Diabetes Sister    Arthritis Sister    Arthritis Sister    Arthritis Sister    Hypertension Sister    Heart attack Sister    Cancer Brother        KIDNEY   Kidney disease Brother    Deep vein thrombosis Brother    Seizures Brother    Heart attack Brother    Colon cancer Neg Hx    Colon polyps Neg Hx     Social History   Socioeconomic History   Marital status: Married    Spouse name: [name]  Number of children: 2   Years of education: GED   Highest education level: Not on file  Occupational History   Occupation: disability    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  Tobacco Use   Smoking status: Former    Packs/day: 1.00    Years: 45.00    Pack years: 45.00    Types: Cigarettes    Quit date: 11/03/2020    Years since quitting: 0.3   Smokeless tobacco: Never   Tobacco comments:    started back - pack per day  Vaping Use   Vaping Use: Never used  Substance and Sexual Activity   Alcohol use: Not Currently    Comment: none since 11/03/20   Drug use: No   Sexual activity: Yes  Other Topics Concern   Not on file  Social History Narrative   Lives with wife [name]   Leisure - couch potato   Social Determinants of Health   Financial Resource Strain: Low Risk    Difficulty of Paying Living Expenses: Not hard at all  Food Insecurity: No Food Insecurity   Worried About Charity fundraiser in the Last Year: Never true   [place] in the Last Year: Never true  Transportation Needs: No Transportation Needs   Lack of Transportation (Medical): No   Lack of Transportation (Non-Medical): No  Physical  Activity: Inactive   Days of Exercise per Week: 0 days   Minutes of Exercise per Session: 0 min  Stress: No Stress Concern Present   Feeling of Stress : Only a little  Social Connections: Not on file    Review of Systems: A 12 point ROS discussed and pertinent positives are indicated in the HPI above.  All other systems are negative.  Review of Systems  Constitutional:  Negative for appetite change and fatigue.  Respiratory:  Negative for cough and shortness of breath.   Cardiovascular:  Negative for chest pain and leg swelling.  Gastrointestinal:  Negative for abdominal pain, diarrhea, nausea and vomiting.  Neurological:  Negative for dizziness, weakness and headaches.   Vital Signs: Temp: 97.8, BP 140/51, HR 60, RR 17, 95% on RA  Physical Exam Constitutional:      General: He is not in acute distress.    Appearance: He is not ill-appearing.  HENT:     Mouth/Throat:     Mouth: Mucous membranes are moist.     Pharynx: Oropharynx is clear.  Cardiovascular:     Rate and Rhythm: Normal rate and regular rhythm.     Pulses: Normal pulses.     Heart sounds: Normal heart sounds.  Pulmonary:     Effort: Pulmonary effort is normal.     Breath sounds: Normal breath sounds.  Abdominal:     General: Bowel sounds are normal.     Palpations: Abdomen is soft.     Tenderness: There is no abdominal tenderness.  Musculoskeletal:     Right lower leg: No edema.     Left lower leg: No edema.  Skin:    General: Skin is warm and dry.  Neurological:     Mental Status: He is alert and oriented to person, place, and time.    Imaging: ECHOCARDIOGRAM COMPLETE  Result Date: 02/13/2021    ECHOCARDIOGRAM REPORT   Patient Name:   BRIGHAM COBBINS Date of Exam: 02/13/2021 Medical Rec #:  [id number]       Height:       72.0 in Accession #:  [id number]      Weight:       191.0 lb Date of Birth:  September 22, 1955       BSA:          2.089 m Patient Age:    62 years        BP:           142/78 mmHg Patient  Gender: M               HR:           50 bpm. Exam Location:  [facility] Procedure: 2D Echo, 3D Echo, Cardiac Doppler, Color Doppler and Strain Analysis Indications:    I35.0 Nonrheumatic aortic (valve) stenosis  History:        Patient has prior history of Echocardiogram examinations, most                 recent 12/04/2020. Stroke and COPD, Arrythmias:Atrial                 Fibrillation; Risk Factors:Former Smoker, Hypertension and                 Dyslipidemia.  Sonographer:    Caesar Chestnut Referring Phys: [id number] Verta Ellen. IMPRESSIONS  1. The mid to distal anteroseptal, anterior, anterolateral inferoseptal walls are akinetic. The apex is akinetic. Marland Kitchen Left ventricular ejection fraction, by estimation, is 20 to 25%. The left ventricle has severely decreased function. The left ventricle demonstrates regional wall motion abnormalities (see scoring diagram/findings for description). Left ventricular diastolic parameters are consistent with Grade II diastolic dysfunction (pseudonormalization). Elevated left atrial pressure.  2. Right ventricular systolic function is normal. The right ventricular size is normal. Tricuspid regurgitation signal is inadequate for assessing PA pressure.  3. Left atrial size was mildly dilated.  4. The mitral valve is abnormal. Mild mitral valve regurgitation. No evidence of mitral stenosis.  5. The aortic valve is tricuspid. There is moderate calcification of the aortic valve. There is moderate thickening of the aortic valve. Aortic valve regurgitation is not visualized. No aortic stenosis is present.  6. The inferior vena cava is normal in size with greater than 50% respiratory variability, suggesting right atrial pressure of 3 mmHg. Comparison(s): LVEF 20-25%. FINDINGS  Left Ventricle: The mid to distal anteroseptal, anterior, anterolateral inferoseptal walls are akinetic. The apex is akinetic. Left ventricular ejection fraction, by estimation, is 20 to 25%. The left ventricle has  severely decreased function. The left ventricle demonstrates regional wall motion abnormalities. The left ventricular internal cavity size was normal in size. There is no left ventricular hypertrophy. Left ventricular diastolic parameters are consistent with Grade II diastolic dysfunction (pseudonormalization). Elevated left atrial pressure. Right Ventricle: The right ventricular size is normal. No increase in right ventricular wall thickness. Right ventricular systolic function is normal. Tricuspid regurgitation signal is inadequate for assessing PA pressure. Left Atrium: Left atrial size was mildly dilated. Right Atrium: Right atrial size was normal in size. Pericardium: There is no evidence of pericardial effusion. Mitral Valve: The mitral valve is abnormal. There is mild thickening of the mitral valve leaflet(s). There is mild calcification of the mitral valve leaflet(s). Mild mitral annular calcification. Mild mitral valve regurgitation. No evidence of mitral valve stenosis. Tricuspid Valve: The tricuspid valve is normal in structure. Tricuspid valve regurgitation is trivial. No evidence of tricuspid stenosis. Aortic Valve: The aortic valve is tricuspid. There is moderate calcification of the aortic valve. There is moderate thickening of the aortic valve. There is  moderate aortic valve annular calcification. Aortic valve regurgitation is not visualized. No aortic stenosis is present. Aortic valve mean gradient measures 5.0 mmHg. Aortic valve peak gradient measures 7.8 mmHg. Aortic valve area, by VTI measures 2.07 cm. Pulmonic Valve: The pulmonic valve was not well visualized. Pulmonic valve regurgitation is mild. No evidence of pulmonic stenosis. Aorta: The aortic root is normal in size and structure. Venous: The inferior vena cava is normal in size with greater than 50% respiratory variability, suggesting right atrial pressure of 3 mmHg. IAS/Shunts: No atrial level shunt detected by color flow Doppler.  LEFT  VENTRICLE PLAX 2D LVIDd:         5.94 cm      Diastology LVIDs:         4.84 cm      LV e' medial:    5.00 cm/s LV PW:         0.99 cm      LV E/e' medial:  23.8 LV IVS:        0.90 cm      LV e' lateral:   4.91 cm/s LVOT diam:     2.10 cm      LV E/e' lateral: 24.2 LV SV:         73 LV SV Index:   35           2D Longitudinal Strain LVOT Area:     3.46 cm     2D Strain GLS Avg:     -9.2 %  LV Volumes (MOD) LV vol d, MOD A2C: 179.0 ml LV vol d, MOD A4C: 184.0 ml LV vol s, MOD A2C: 117.0 ml LV vol s, MOD A4C: 116.0 ml LV SV MOD A2C:     62.0 ml LV SV MOD A4C:     184.0 ml LV SV MOD BP:      61.0 ml RIGHT VENTRICLE RV Basal diam:  4.05 cm RV Mid diam:    2.29 cm RV S prime:     10.20 cm/s TAPSE (M-mode): 2.2 cm RIGHT ATRIUM           Index RA Area:     14.50 cm RA Volume:   36.00 ml  17.23 ml/m  AORTIC VALVE                     PULMONIC VALVE AV Area (Vmax):    2.21 cm      PV Vmax:          0.71 m/s AV Area (Vmean):   2.16 cm      PV Peak grad:     2.0 mmHg AV Area (VTI):     2.07 cm      PR End Diast Vel: 5.86 msec AV Vmax:           140.00 cm/s AV Vmean:          107.000 cm/s AV VTI:            0.353 m AV Peak Grad:      7.8 mmHg AV Mean Grad:      5.0 mmHg LVOT Vmax:         89.40 cm/s LVOT Vmean:        66.700 cm/s LVOT VTI:          0.211 m LVOT/AV VTI ratio: 0.60  AORTA Ao Root diam: 3.20 cm Ao Asc diam:  3.00 cm MITRAL VALVE MV Area (PHT): 3.65 cm  SHUNTS MV Decel Time: 208 msec       Systemic VTI:  0.21 m MR Peak grad:    74.6 mmHg    Systemic Diam: 2.10 cm MR Mean grad:    53.5 mmHg MR Vmax:         432.00 cm/s MR Vmean:        350.5 cm/s MR PISA:         24.91 cm MR PISA Eff ROA: 4 mm MR PISA Radius:  0.30 cm MV E velocity: 119.00 cm/s MV A velocity: 67.60 cm/s MV E/A ratio:  1.76 Carlyle Dolly MD Electronically signed by Carlyle Dolly MD Signature Date/Time: 02/13/2021/4:46:57 PM    Final     Labs:  CBC: Recent Labs    11/06/20 0425 11/07/20 0302 11/08/20 0145 03/06/21 0717  WBC  9.5 9.3 9.0 6.0  HGB 12.9* 12.8* 12.4* 13.1  HCT 37.4* 37.1* 35.8* 38.8*  PLT 134* 129* 137* 176    COAGS: Recent Labs    11/03/20 0603  INR 1.0  APTT 39*    BMP: Recent Labs    11/05/20 0405 11/06/20 0425 11/07/20 0302 11/08/20 0145 12/02/20 1337 12/19/20 1036  NA 135 129* 135 135 141 140  K 3.6 2.9* 3.4* 3.1* 3.4* 4.0  CL 104 100 104 105 103 101  CO2 23 23 23 24 22 22   GLUCOSE 101* 99 90 102* 89 103*  BUN 15 14 11 11 9 9   CALCIUM 9.4 8.9 9.2 9.2 10.4* 10.3*  CREATININE 0.83 0.80 0.76 0.82 0.88 0.90  GFRNONAA >60 >60 >60 >60  --   --     LIVER FUNCTION TESTS: Recent Labs    11/03/20 0603  BILITOT 1.8*  AST 83*  ALT 30  ALKPHOS 131*  PROT 7.5  ALBUMIN 4.0    TUMOR MARKERS: No results for input(s): AFPTM, CEA, CA199, CHROMGRNA in the last 8760 hours.  Assessment and Plan:  Left ICA stenosis: Gwyndolyn Saxon L. Laurance Flatten, 66 year old male, presents today to the [facility] department for an image-guided diagnostic cerebral angiogram with possible left ICA stent placement. This procedure will be done with general anesthesia and planned overnight observation in the Neuro ICU.   Risks and benefits of this procedure were discussed with the patient including, but not limited to bleeding, infection, vascular injury or contrast induced renal failure.  This interventional procedure involves the use of X-rays and because of the nature of the planned procedure, it is possible that we will have prolonged use of X-ray fluoroscopy.  Potential radiation risks to you include (but are not limited to) the following: - A slightly elevated risk for cancer  several years later in life. This risk is typically less than 0.5% percent. This risk is low in comparison to the normal incidence of human cancer, which is 33% for women and 50% for men according to the [place]. - Radiation induced injury can include skin redness, resembling a rash, tissue  breakdown / ulcers and hair loss (which can be temporary or permanent).   The likelihood of either of these occurring depends on the difficulty of the procedure and whether you are sensitive to radiation due to previous procedures, disease, or genetic conditions.   IF your procedure requires a prolonged use of radiation, you will be notified and given written instructions for further action.  It is your responsibility to monitor the irradiated area for the 2 weeks following the procedure and to notify your physician if you are  concerned that you have suffered a radiation induced injury.    All of the patient's questions were answered, patient is agreeable to proceed. He has been NPO. He continues to take Plavix but has held his Eliquis for the past two days.   Consent signed and in chart.   Thank you for this interesting consult.  I greatly enjoyed meeting JERREN FLINCHBAUGH and look forward to participating in their care.  A copy of this report was sent to the requesting provider on this date.  Electronically Signed: Soyla Dryer, AGACNP-BC [phone] 03/06/2021, 7:26 AM   I spent a total of  30 Minutes   in face to face in clinical consultation, greater than 50% of which was counseling/coordinating care for image-guided cerebral angiogram with possible left ICA stent.

## 2021-03-05 NOTE — H&P (Deleted)
  The note originally documented on this encounter has been moved the the encounter in which it belongs.  

## 2021-03-05 NOTE — Telephone Encounter (Signed)
As below, duplicate clearance -I addressed and faxed this on 03/03/21. Arbie Cookey, could you please call surgeon to ensure they received our recommendations on 2/13 since we got a repeat entry into the box? During my phone call with patient on 2/13 I had informed him to hold Eliquis 2 days before the procedure already. Please route back to preop box if there are any additional questions they need answered, otherwise will remove.

## 2021-03-06 ENCOUNTER — Inpatient Hospital Stay (HOSPITAL_COMMUNITY): Payer: Medicare Other | Admitting: Vascular Surgery

## 2021-03-06 ENCOUNTER — Ambulatory Visit (HOSPITAL_COMMUNITY)
Admission: RE | Admit: 2021-03-06 | Discharge: 2021-03-06 | Disposition: A | Discharge status: Home / Self Care | Payer: Medicare Other | Source: Ambulatory Visit | Attending: Interventional Radiology | Admitting: Interventional Radiology

## 2021-03-06 ENCOUNTER — Encounter (HOSPITAL_COMMUNITY): Payer: Self-pay | Admitting: Interventional Radiology

## 2021-03-06 ENCOUNTER — Other Ambulatory Visit: Payer: Self-pay

## 2021-03-06 ENCOUNTER — Inpatient Hospital Stay (HOSPITAL_BASED_OUTPATIENT_CLINIC_OR_DEPARTMENT_OTHER): Payer: Medicare Other | Admitting: Vascular Surgery

## 2021-03-06 ENCOUNTER — Ambulatory Visit: Payer: Self-pay | Admitting: *Deleted

## 2021-03-06 ENCOUNTER — Other Ambulatory Visit: Payer: Self-pay | Admitting: *Deleted

## 2021-03-06 ENCOUNTER — Encounter (HOSPITAL_COMMUNITY)
Admission: RE | Disposition: A | Discharge status: Home / Self Care | Payer: Self-pay | Source: Home / Self Care | Attending: Interventional Radiology

## 2021-03-06 ENCOUNTER — Inpatient Hospital Stay (HOSPITAL_COMMUNITY)
Admission: RE | Admit: 2021-03-06 | Discharge: 2021-03-07 | DRG: 068 | Disposition: A | Discharge status: Home / Self Care | Payer: Medicare Other | Attending: Interventional Radiology | Admitting: Interventional Radiology

## 2021-03-06 DIAGNOSIS — Z79899 Other long term (current) drug therapy: Secondary | ICD-10-CM | POA: Diagnosis not present

## 2021-03-06 DIAGNOSIS — G4733 Obstructive sleep apnea (adult) (pediatric): Secondary | ICD-10-CM | POA: Diagnosis present

## 2021-03-06 DIAGNOSIS — Z833 Family history of diabetes mellitus: Secondary | ICD-10-CM

## 2021-03-06 DIAGNOSIS — Z7902 Long term (current) use of antithrombotics/antiplatelets: Secondary | ICD-10-CM

## 2021-03-06 DIAGNOSIS — I255 Ischemic cardiomyopathy: Secondary | ICD-10-CM | POA: Diagnosis not present

## 2021-03-06 DIAGNOSIS — Z7901 Long term (current) use of anticoagulants: Secondary | ICD-10-CM

## 2021-03-06 DIAGNOSIS — I6522 Occlusion and stenosis of left carotid artery: Secondary | ICD-10-CM

## 2021-03-06 DIAGNOSIS — J449 Chronic obstructive pulmonary disease, unspecified: Secondary | ICD-10-CM | POA: Diagnosis not present

## 2021-03-06 DIAGNOSIS — I10 Essential (primary) hypertension: Secondary | ICD-10-CM | POA: Diagnosis not present

## 2021-03-06 DIAGNOSIS — I6523 Occlusion and stenosis of bilateral carotid arteries: Secondary | ICD-10-CM | POA: Diagnosis not present

## 2021-03-06 DIAGNOSIS — E785 Hyperlipidemia, unspecified: Secondary | ICD-10-CM | POA: Diagnosis present

## 2021-03-06 DIAGNOSIS — I69354 Hemiplegia and hemiparesis following cerebral infarction affecting left non-dominant side: Secondary | ICD-10-CM | POA: Diagnosis not present

## 2021-03-06 DIAGNOSIS — I739 Peripheral vascular disease, unspecified: Secondary | ICD-10-CM | POA: Diagnosis not present

## 2021-03-06 DIAGNOSIS — Z806 Family history of leukemia: Secondary | ICD-10-CM | POA: Diagnosis not present

## 2021-03-06 DIAGNOSIS — I69352 Hemiplegia and hemiparesis following cerebral infarction affecting left dominant side: Secondary | ICD-10-CM | POA: Diagnosis not present

## 2021-03-06 DIAGNOSIS — Z87891 Personal history of nicotine dependence: Secondary | ICD-10-CM | POA: Diagnosis not present

## 2021-03-06 DIAGNOSIS — I252 Old myocardial infarction: Secondary | ICD-10-CM | POA: Diagnosis not present

## 2021-03-06 DIAGNOSIS — I480 Paroxysmal atrial fibrillation: Secondary | ICD-10-CM | POA: Diagnosis present

## 2021-03-06 DIAGNOSIS — I63239 Cerebral infarction due to unspecified occlusion or stenosis of unspecified carotid arteries: Secondary | ICD-10-CM | POA: Diagnosis not present

## 2021-03-06 DIAGNOSIS — I5022 Chronic systolic (congestive) heart failure: Secondary | ICD-10-CM | POA: Diagnosis not present

## 2021-03-06 DIAGNOSIS — Z20822 Contact with and (suspected) exposure to covid-19: Secondary | ICD-10-CM | POA: Diagnosis not present

## 2021-03-06 DIAGNOSIS — Z8249 Family history of ischemic heart disease and other diseases of the circulatory system: Secondary | ICD-10-CM | POA: Diagnosis not present

## 2021-03-06 DIAGNOSIS — I2510 Atherosclerotic heart disease of native coronary artery without angina pectoris: Secondary | ICD-10-CM

## 2021-03-06 DIAGNOSIS — I509 Heart failure, unspecified: Secondary | ICD-10-CM | POA: Diagnosis not present

## 2021-03-06 DIAGNOSIS — Z841 Family history of disorders of kidney and ureter: Secondary | ICD-10-CM

## 2021-03-06 DIAGNOSIS — I110 Hypertensive heart disease with heart failure: Secondary | ICD-10-CM | POA: Diagnosis not present

## 2021-03-06 DIAGNOSIS — I63232 Cerebral infarction due to unspecified occlusion or stenosis of left carotid arteries: Secondary | ICD-10-CM

## 2021-03-06 DIAGNOSIS — Z823 Family history of stroke: Secondary | ICD-10-CM

## 2021-03-06 DIAGNOSIS — I6529 Occlusion and stenosis of unspecified carotid artery: Secondary | ICD-10-CM | POA: Diagnosis present

## 2021-03-06 DIAGNOSIS — Z8261 Family history of arthritis: Secondary | ICD-10-CM

## 2021-03-06 HISTORY — DX: Dyspnea, unspecified: R06.00

## 2021-03-06 HISTORY — PX: IR ANGIO INTRA EXTRACRAN SEL COM CAROTID INNOMINATE BILAT MOD SED: IMG5360

## 2021-03-06 HISTORY — PX: IR US GUIDE VASC ACCESS RIGHT: IMG2390

## 2021-03-06 HISTORY — DX: Heart failure, unspecified: I50.9

## 2021-03-06 HISTORY — DX: Disorder of arteries and arterioles, unspecified: I77.9

## 2021-03-06 HISTORY — DX: Peripheral vascular disease, unspecified: I73.9

## 2021-03-06 HISTORY — PX: RADIOLOGY WITH ANESTHESIA: SHX6223

## 2021-03-06 HISTORY — DX: Cardiac murmur, unspecified: R01.1

## 2021-03-06 HISTORY — DX: Atherosclerotic heart disease of native coronary artery without angina pectoris: I25.10

## 2021-03-06 LAB — CBC
HCT: 38.8 % — ABNORMAL LOW (ref 39.0–52.0)
Hemoglobin: 13.1 g/dL (ref 13.0–17.0)
MCH: 31.4 pg (ref 26.0–34.0)
MCHC: 33.8 g/dL (ref 30.0–36.0)
MCV: 93 fL (ref 80.0–100.0)
Platelets: 176 10*3/uL (ref 150–400)
RBC: 4.17 MIL/uL — ABNORMAL LOW (ref 4.22–5.81)
RDW: 12.1 % (ref 11.5–15.5)
WBC: 6 10*3/uL (ref 4.0–10.5)
nRBC: 0 % (ref 0.0–0.2)

## 2021-03-06 LAB — BASIC METABOLIC PANEL
Anion gap: 9 (ref 5–15)
BUN: 13 mg/dL (ref 8–23)
CO2: 24 mmol/L (ref 22–32)
Calcium: 9.9 mg/dL (ref 8.9–10.3)
Chloride: 106 mmol/L (ref 98–111)
Creatinine, Ser: 0.94 mg/dL (ref 0.61–1.24)
GFR, Estimated: >60 mL/min (ref >60)
Glucose, Bld: 117 mg/dL — ABNORMAL HIGH (ref 70–99)
Potassium: 3.8 mmol/L (ref 3.5–5.1)
Sodium: 139 mmol/L (ref 135–145)

## 2021-03-06 LAB — PROTIME-INR
INR: 1.1 (ref 0.8–1.2)
Prothrombin Time: 14.1 seconds (ref 11.4–15.2)

## 2021-03-06 SURGERY — IR WITH ANESTHESIA
Anesthesia: General | Laterality: Left

## 2021-03-06 MED ORDER — CLOPIDOGREL BISULFATE 75 MG PO TABS
300.0000 mg | ORAL_TABLET | Freq: Once | ORAL | Status: DC
Start: 2021-03-06 — End: 2021-03-07

## 2021-03-06 MED ORDER — PROPOFOL 10 MG/ML IV BOLUS
INTRAVENOUS | Status: DC | PRN
  Administered 2021-03-06: 50 mg via INTRAVENOUS

## 2021-03-06 MED ORDER — LABETALOL HCL 5 MG/ML IV SOLN
INTRAVENOUS | Status: DC | PRN
  Administered 2021-03-06 (×2): 10 mg via INTRAVENOUS

## 2021-03-06 MED ORDER — GLYCOPYRROLATE 0.2 MG/ML IJ SOLN
INTRAMUSCULAR | Status: DC | PRN
  Administered 2021-03-06 (×2): .1 mg via INTRAVENOUS

## 2021-03-06 MED ORDER — CHLORHEXIDINE GLUCONATE 0.12 % MT SOLN: 15.0000 mL | Freq: Once | OROMUCOSAL | Status: AC

## 2021-03-06 MED ORDER — PROMETHAZINE HCL 25 MG/ML IJ SOLN: 6.2500 mg | INTRAMUSCULAR | Status: DC | PRN

## 2021-03-06 MED ORDER — PHENYLEPHRINE HCL-NACL 20-0.9 MG/250ML-% IV SOLN
INTRAVENOUS | Status: DC | PRN
  Administered 2021-03-06: 50 ug/min via INTRAVENOUS

## 2021-03-06 MED ORDER — MEPERIDINE HCL 25 MG/ML IJ SOLN: 6.2500 mg | INTRAMUSCULAR | Status: DC | PRN

## 2021-03-06 MED ORDER — ROCURONIUM BROMIDE 10 MG/ML (PF) SYRINGE
PREFILLED_SYRINGE | INTRAVENOUS | Status: DC | PRN
Start: 2021-03-06 — End: 2021-03-06
  Administered 2021-03-06: 30 mg via INTRAVENOUS
  Administered 2021-03-06: 70 mg via INTRAVENOUS

## 2021-03-06 MED ORDER — MIDAZOLAM HCL 2 MG/2ML IJ SOLN: 0.5000 mg | Freq: Once | INTRAMUSCULAR | Status: DC | PRN

## 2021-03-06 MED ORDER — ASPIRIN 325 MG PO TABS: 650.0000 mg | ORAL_TABLET | Freq: Once | ORAL | Status: DC

## 2021-03-06 MED ORDER — ONDANSETRON HCL 4 MG/2ML IJ SOLN
INTRAMUSCULAR | Status: DC | PRN
  Administered 2021-03-06: 4 mg via INTRAVENOUS

## 2021-03-06 MED ORDER — SUGAMMADEX SODIUM 200 MG/2ML IV SOLN
INTRAVENOUS | Status: DC | PRN
  Administered 2021-03-06: 200 mg via INTRAVENOUS

## 2021-03-06 MED ORDER — OXYCODONE HCL 5 MG PO TABS: 5.0000 mg | ORAL_TABLET | Freq: Once | ORAL | Status: DC | PRN

## 2021-03-06 MED ORDER — IOHEXOL 300 MG/ML  SOLN
100.0000 mL | Freq: Once | INTRAMUSCULAR | Status: AC | PRN
  Administered 2021-03-06: 50 mL via INTRA_ARTERIAL

## 2021-03-06 MED ORDER — SODIUM CHLORIDE 0.9 % IV SOLN: INTRAVENOUS | Status: DC

## 2021-03-06 MED ORDER — LACTATED RINGERS IV SOLN: INTRAVENOUS | Status: DC

## 2021-03-06 MED ORDER — HEPARIN SODIUM (PORCINE) 1000 UNIT/ML IJ SOLN
INTRAMUSCULAR | Status: DC | PRN
  Administered 2021-03-06: 5000 [IU] via INTRAVENOUS

## 2021-03-06 MED ORDER — CHLORHEXIDINE GLUCONATE 0.12 % MT SOLN
OROMUCOSAL | Status: AC
  Administered 2021-03-06: 15 mL via OROMUCOSAL
  Filled 2021-03-06: qty 15

## 2021-03-06 MED ORDER — EPHEDRINE SULFATE-NACL 50-0.9 MG/10ML-% IV SOSY
PREFILLED_SYRINGE | INTRAVENOUS | Status: DC | PRN
  Administered 2021-03-06 (×2): 5 mg via INTRAVENOUS
  Administered 2021-03-06: 10 mg via INTRAVENOUS

## 2021-03-06 MED ORDER — FENTANYL CITRATE (PF) 250 MCG/5ML IJ SOLN
INTRAMUSCULAR | Status: DC | PRN
  Administered 2021-03-06: 100 ug via INTRAVENOUS

## 2021-03-06 MED ORDER — IOHEXOL 300 MG/ML  SOLN
100.0000 mL | Freq: Once | INTRAMUSCULAR | Status: AC | PRN
  Administered 2021-03-06: 30 mL via INTRA_ARTERIAL

## 2021-03-06 MED ORDER — FENTANYL CITRATE (PF) 100 MCG/2ML IJ SOLN: 25.0000 ug | INTRAMUSCULAR | Status: DC | PRN

## 2021-03-06 MED ORDER — LIDOCAINE 2% (20 MG/ML) 5 ML SYRINGE
INTRAMUSCULAR | Status: DC | PRN
Start: 2021-03-06 — End: 2021-03-06
  Administered 2021-03-06: 20 mg via INTRAVENOUS

## 2021-03-06 MED ORDER — ETOMIDATE 2 MG/ML IV SOLN
INTRAVENOUS | Status: DC | PRN
  Administered 2021-03-06: 16 mg via INTRAVENOUS

## 2021-03-06 MED ORDER — ORAL CARE MOUTH RINSE: 15.0000 mL | Freq: Once | OROMUCOSAL | Status: AC

## 2021-03-06 MED ORDER — DEXAMETHASONE SODIUM PHOSPHATE 10 MG/ML IJ SOLN
INTRAMUSCULAR | Status: DC | PRN
Start: 2021-03-06 — End: 2021-03-06
  Administered 2021-03-06: 10 mg via INTRAVENOUS

## 2021-03-06 MED ORDER — OXYCODONE HCL 5 MG/5ML PO SOLN: 5.0000 mg | Freq: Once | ORAL | Status: DC | PRN

## 2021-03-06 NOTE — Procedures (Signed)
Neuro-Interventional Radiology  Post Cerebral Angiogram Procedure Note  Operator:    Dr. Earleen Newport Assistant:   None  History:    Mr Purdom is a very pleasant 66 year-old male with left ICA disease, critical stenosis, presenting for cerebral angiogram and possible treatment.    Procedure: US guided right CFA access Cervical & Cerebral Angiogram Attempt of left ICA stenting.    Findings:   Multi-focal calcified stenosis of the bulb and ICA.  Inability to navigate distally beyond the critical stenosis.   Anesthesia:   GETA  EBL:    88LN     Complication:  None   Medication: 5K U IV heparin  Recommendations: - PACU recovery --> med surg bed overnight - right hip straight x 4 hours - Restart eliquis tomorrow - Frequent NV checks, right CFA site, neuro checks - target normal SBP range  Signed,  Dulcy Fanny. Earleen Newport, DO

## 2021-03-06 NOTE — Progress Notes (Signed)
Patient transferred to 3W16, receiving RN at bedside, notified spouse of patients location. No questions/concerns from receiving RN or patient.  Rowe Pavy, RN

## 2021-03-06 NOTE — Sedation Documentation (Signed)
8 fr angioseal deployed

## 2021-03-06 NOTE — Sedation Documentation (Signed)
Pt transport to PACU bay 9 via stretcher accompanied by RN and CRNA. Heather RN at bedside to receive pt. Right groin remains level 0. Bilateral distal pulses present via doppler. Pt awake and responds appropriately. No s/s of distress at this time.

## 2021-03-06 NOTE — Anesthesia Procedure Notes (Signed)
Procedure Name: Intubation Date/Time: 03/06/2021 9:02 AM Performed by: Lance Coon, CRNA Pre-anesthesia Checklist: Patient identified, Emergency Drugs available, Suction available, Patient being monitored and Timeout performed Patient Re-evaluated:Patient Re-evaluated prior to induction Oxygen Delivery Method: Circle system utilized Preoxygenation: Pre-oxygenation with 100% oxygen Induction Type: IV induction Ventilation: Mask ventilation without difficulty Laryngoscope Size: Miller and 3 Grade View: Grade I Tube type: Oral Tube size: 7.5 mm Number of attempts: 1 Airway Equipment and Method: Stylet Placement Confirmation: ETT inserted through vocal cords under direct vision, positive ETCO2 and breath sounds checked- equal and bilateral Secured at: 22 cm Tube secured with: Tape Dental Injury: Teeth and Oropharynx as per pre-operative assessment

## 2021-03-06 NOTE — Patient Outreach (Signed)
[facility]) Care Management  03/06/2021  Christopher Lang Jan 19, 1956 [id number]   Member scheduled for outreach today to follow up on carotid intervention.  Noted he is currently admitted to hospital for procedure.  Will follow up with member post discharge.  [name], RN, MSN, [place] Manager [phone]

## 2021-03-06 NOTE — Transfer of Care (Signed)
Immediate Anesthesia Transfer of Care Note  Patient: Christopher Lang  Procedure(s) Performed: CAROTID STENT (Left)  Patient Location: PACU  Anesthesia Type:General  Level of Consciousness: drowsy and patient cooperative  Airway & Oxygen Therapy: Patient Spontanous Breathing and Patient connected to face mask oxygen  Post-op Assessment: Report given to RN and Post -op Vital signs reviewed and stable  Post vital signs: Reviewed and stable  Last Vitals:  Vitals Value Taken Time  BP 147/69 03/06/21 1115  Temp 36.3 C 03/06/21 1115  Pulse 79 03/06/21 1119  Resp 15 03/06/21 1119  SpO2 95 % 03/06/21 1119  Vitals shown include unvalidated device data.  Last Pain:  Vitals:   03/06/21 1115  TempSrc:   PainSc: 0-No pain      Patients Stated Pain Goal: 0 (15/05/69 7948)  Complications: No notable events documented.

## 2021-03-06 NOTE — Anesthesia Procedure Notes (Signed)
Arterial Line Insertion Start/End2/16/2023 7:50 AM, 03/06/2021 8:10 AM Performed by: Lance Coon, CRNA, CRNA  Patient location: Pre-op. Preanesthetic checklist: patient identified, IV checked, site marked, risks and benefits discussed, surgical consent, monitors and equipment checked, pre-op evaluation, timeout performed and anesthesia consent Lidocaine 1% used for infiltration Left, radial was placed Catheter size: 20 G Hand hygiene performed , maximum sterile barriers used  and Seldinger technique used  Attempts: 2 Procedure performed without using ultrasound guided technique. Ultrasound Notes:anatomy identified Following insertion, dressing applied and Biopatch. Post procedure assessment: normal and unchanged  Patient tolerated the procedure well with no immediate complications.

## 2021-03-06 NOTE — Anesthesia Postprocedure Evaluation (Signed)
Anesthesia Post Note  Patient: MIHIR FLANIGAN  Procedure(s) Performed: CAROTID STENT (Left)     Patient location during evaluation: PACU Anesthesia Type: General Level of consciousness: awake and alert, patient cooperative and oriented Pain management: pain level controlled Vital Signs Assessment: post-procedure vital signs reviewed and stable Respiratory status: spontaneous breathing, nonlabored ventilation and respiratory function stable Cardiovascular status: blood pressure returned to baseline and stable Postop Assessment: no apparent nausea or vomiting and adequate PO intake Anesthetic complications: no   No notable events documented.  Last Vitals:  Vitals:   03/06/21 1130 03/06/21 1145  BP: (!) 142/64 (!) 127/50  Pulse: 82 74  Resp: 14 12  Temp:    SpO2: 91% 93%    Last Pain:  Vitals:   03/06/21 1145  TempSrc:   PainSc: 0-No pain                 Lynesha Bango,E. Gerldine Suleiman

## 2021-03-06 NOTE — Progress Notes (Signed)
Pt arrived to unit. VSS, pt alert & oriented. Site stable. Bed in low position, alarm on, call bell in reach.  Gwendolyn Grant, RN

## 2021-03-07 ENCOUNTER — Encounter (HOSPITAL_COMMUNITY): Payer: Self-pay | Admitting: Interventional Radiology

## 2021-03-07 DIAGNOSIS — Z87891 Personal history of nicotine dependence: Secondary | ICD-10-CM | POA: Diagnosis not present

## 2021-03-07 DIAGNOSIS — Z8261 Family history of arthritis: Secondary | ICD-10-CM | POA: Diagnosis not present

## 2021-03-07 DIAGNOSIS — I6523 Occlusion and stenosis of bilateral carotid arteries: Secondary | ICD-10-CM | POA: Diagnosis present

## 2021-03-07 DIAGNOSIS — I6522 Occlusion and stenosis of left carotid artery: Secondary | ICD-10-CM | POA: Diagnosis present

## 2021-03-07 DIAGNOSIS — I739 Peripheral vascular disease, unspecified: Secondary | ICD-10-CM | POA: Diagnosis present

## 2021-03-07 DIAGNOSIS — J449 Chronic obstructive pulmonary disease, unspecified: Secondary | ICD-10-CM | POA: Diagnosis present

## 2021-03-07 DIAGNOSIS — I2510 Atherosclerotic heart disease of native coronary artery without angina pectoris: Secondary | ICD-10-CM | POA: Diagnosis present

## 2021-03-07 DIAGNOSIS — I480 Paroxysmal atrial fibrillation: Secondary | ICD-10-CM | POA: Diagnosis present

## 2021-03-07 DIAGNOSIS — I5022 Chronic systolic (congestive) heart failure: Secondary | ICD-10-CM | POA: Diagnosis present

## 2021-03-07 DIAGNOSIS — E785 Hyperlipidemia, unspecified: Secondary | ICD-10-CM | POA: Diagnosis present

## 2021-03-07 DIAGNOSIS — Z823 Family history of stroke: Secondary | ICD-10-CM | POA: Diagnosis not present

## 2021-03-07 DIAGNOSIS — I255 Ischemic cardiomyopathy: Secondary | ICD-10-CM | POA: Diagnosis present

## 2021-03-07 DIAGNOSIS — Z806 Family history of leukemia: Secondary | ICD-10-CM | POA: Diagnosis not present

## 2021-03-07 DIAGNOSIS — Z7902 Long term (current) use of antithrombotics/antiplatelets: Secondary | ICD-10-CM | POA: Diagnosis not present

## 2021-03-07 DIAGNOSIS — Z8249 Family history of ischemic heart disease and other diseases of the circulatory system: Secondary | ICD-10-CM | POA: Diagnosis not present

## 2021-03-07 DIAGNOSIS — Z833 Family history of diabetes mellitus: Secondary | ICD-10-CM | POA: Diagnosis not present

## 2021-03-07 DIAGNOSIS — I252 Old myocardial infarction: Secondary | ICD-10-CM | POA: Diagnosis not present

## 2021-03-07 DIAGNOSIS — I69354 Hemiplegia and hemiparesis following cerebral infarction affecting left non-dominant side: Secondary | ICD-10-CM | POA: Diagnosis not present

## 2021-03-07 DIAGNOSIS — G4733 Obstructive sleep apnea (adult) (pediatric): Secondary | ICD-10-CM | POA: Diagnosis present

## 2021-03-07 DIAGNOSIS — Z841 Family history of disorders of kidney and ureter: Secondary | ICD-10-CM | POA: Diagnosis not present

## 2021-03-07 DIAGNOSIS — Z7901 Long term (current) use of anticoagulants: Secondary | ICD-10-CM | POA: Diagnosis not present

## 2021-03-07 DIAGNOSIS — Z79899 Other long term (current) drug therapy: Secondary | ICD-10-CM | POA: Diagnosis not present

## 2021-03-07 DIAGNOSIS — I110 Hypertensive heart disease with heart failure: Secondary | ICD-10-CM | POA: Diagnosis present

## 2021-03-07 DIAGNOSIS — Z20822 Contact with and (suspected) exposure to covid-19: Secondary | ICD-10-CM | POA: Diagnosis present

## 2021-03-07 NOTE — Discharge Summary (Signed)
Chief Complaint: left ICA stenosis    Referring Physician(s): Wendell   Supervising Physician: Corrie Mckusick   Patient Status: [facility] Lang - Out-pt   History of Present Illness: Christopher Lang is a 66 y.o. male with a medical history significant for OSA, COPD, tobacco use, HTN and paroxysmal atrial fibrillation. He was admitted to [facility] 11/03/20 with sudden onset left-sided weakness, chest pain and nausea. He was found to have an anterior STEMI and large vessel occlusion of the right M1 segement with severe bilateral ICA  origin stenosis and 60% left subclavian stenosis. He was given tNKase by neurology and underwent right M1 mechanical thrombectomy as well as PTA of the high grade symptomatic stenosis of the right ICA bifurcation 11/03/20 in IR. The patient was noted to have 80-99% right ICA re-stenosis on follow up carotid US and the patient presented to IR for right ICA angioplasty with stent  placement 11/06/20.    At that time IR was unable to fully assess the left ICA stenosis due to severe calcification. The patient was discharged home 11/08/20 with outpatient follow up and he met with Dr. Earleen Newport 01/23/21 to discuss the previously treated right ICA stenosis and possible treatment options for the left ICA stenosis.    CTA during his October admission demonstrated critically left ICA stenosis/string sign. Duplex did not measure an elevated velocity at the bifurcation, as there was calcified plaque obscuring the measurement. Beyond the bifurcation, there was spectral broadening and parvus tardus waveform compatible with the high grade stenosis on the CTA.    Interval: Christopher Lang underwent cerebral angiogram yesterday via right CFA access.   Attempt was made to perform left ICA stenting, and was discontinued secondary to multiple sequential critical stenosis secondary to calcified plaque and inability to navigate.   He recovered in PACU and then med-surg bed.    He is feeling well this am,  and denies any new symptoms.  He has ambulated this am to the bathroom.  His wife has stayed with him overnight, and she has no concerns.    Past Medical History:  Diagnosis Date   Acute CVA (cerebrovascular accident) ([place]) 11/03/2020   Acute left-sided weakness 11/03/2020   Acute ST elevation myocardial infarction (STEMI) ([place]) 11/03/2020   Carotid artery disease [facility])    Cataract    Removed Dec 2017/Jan 2018   CHF (congestive heart failure) (H[facility])    COPD (chronic obstructive pulmonary disease) (H[facility])    Coronary artery disease    Dyspnea    Essential hypertension    H. pylori infection 11/2016   With documented eradication via biopsy Feb 2019   Heart murmur    History of atrial fibrillation    November 2017   Hyperlipidemia    Peripheral vascular disease ([place])    Sleep apnea    Did not tolerate CPAP   Tubulovillous adenoma of rectum 04/2010    Past Surgical History:  Procedure Laterality Date   CATARACT EXTRACTION W/PHACO Right 01/16/2016   Procedure: CATARACT EXTRACTION PHACO AND INTRAOCULAR LENS PLACEMENT RIGHT EYE CDE= 6.97;  Surgeon: Tonny Branch, MD;  Location: [facility];  Service: Ophthalmology;  Laterality: Right;  right   CATARACT EXTRACTION W/PHACO Left 01/30/2016   Procedure: CATARACT EXTRACTION PHACO AND INTRAOCULAR LENS PLACEMENT LEFT EYE CDE 3.13;  Surgeon: Tonny Branch, MD;  Location: [facility];  Service: Ophthalmology;  Laterality: Left;  left   COLONOSCOPY  2012   COLONOSCOPY N/A 12/04/2016   three 4-6 mm tubular adenomas, moderate size external  and internal hemorrhoids, redundant left colon, 3 year surveillance   ESOPHAGOGASTRODUODENOSCOPY N/A 12/04/2016   Dr. Oneida Alar: normal esophagus, small hiatal hernia, chronic gastritis with H.pylori, peptic duodenitis, atypical lymphoid proliferation. Surveillance EGD Feb 2019 without abnormal cells, negative H.pylori   ESOPHAGOGASTRODUODENOSCOPY N/A 03/15/2017   mild gastritis, no atypical cells, negative H.pylori   EXCISION, SKI   08/2016   R THIGH   IR ANGIO INTRA EXTRACRAN SEL COM CAROTID INNOMINATE BILAT MOD SED  11/06/2020   IR ANGIO INTRA EXTRACRAN SEL COM CAROTID INNOMINATE BILAT MOD SED  03/06/2021   IR CT HEAD LTD  11/03/2020   IR INTRAVSC STENT CERV CAROTID W/EMB-PROT MOD SED INCL ANGIO  11/06/2020   IR PERCUTANEOUS ART THROMBECTOMY/INFUSION INTRACRANIAL INC DIAG ANGIO  11/03/2020   IR PTA INTRACRANIAL  11/04/2020   IR RADIOLOGIST EVAL & MGMT  01/23/2021   IR US GUIDE VASC ACCESS LEFT  11/03/2020   IR US GUIDE VASC ACCESS RIGHT  11/03/2020   IR US GUIDE VASC ACCESS RIGHT  11/06/2020   IR US GUIDE VASC ACCESS RIGHT  03/06/2021   RADIOLOGY WITH ANESTHESIA N/A 11/03/2020   Procedure: RADIOLOGY WITH ANESTHESIA;  Surgeon: Radiologist, Medication, MD;  Location: [place];  Service: Radiology;  Laterality: N/A;   RADIOLOGY WITH ANESTHESIA N/A 11/06/2020   Procedure: STENTING;  Surgeon: Corrie Mckusick, DO;  Location: [place];  Service: Anesthesiology;  Laterality: N/A;    Allergies: Patient has no known allergies.  Medications: Prior to Admission medications   Medication Sig Start Date End Date Taking? Authorizing Provider  atorvastatin (LIPITOR) 40 MG tablet Take 1 tablet (40 mg total) by mouth daily at 6 PM. 11/08/20  Yes [name], PA-C  clopidogrel (PLAVIX) 75 MG tablet Take 1 tablet (75 mg total) by mouth daily. 11/09/20  Yes [name], PA-C  metoprolol succinate (TOPROL XL) 25 MG 24 hr tablet Take 1 tablet (25 mg total) by mouth daily. 02/19/21  Yes Strader, [place] M, PA-C  sacubitril-valsartan (ENTRESTO) 49-51 MG Take 1 tablet by mouth 2 (two) times daily. 12/06/20  Yes Verta Ellen., NP  spironolactone (ALDACTONE) 25 MG tablet Take 0.5 tablets (12.5 mg total) by mouth daily. Patient taking differently: Take 25 mg by mouth daily. 11/09/20  Yes [name], PA-C  varenicline (CHANTIX) 0.5 MG tablet Take 1 tablet (0.5 mg total) by mouth 2 (two) times daily. 11/08/20  Yes [name], PA-C  apixaban (ELIQUIS) 5 MG TABS tablet Take 1 tablet (5 mg total) by mouth 2 (two) times daily. 03/04/21   Satira Sark, MD  Melatonin 10 MG CAPS Take 10 mg by mouth at bedtime as needed (sleep).    [provider]     Family History  Problem Relation Age of Onset   CAD Mother    High blood pressure Mother    Heart attack Mother    Leukemia Father    Early death Father    Heart attack Sister    Diabetes Sister    Cancer Brother    Deep vein thrombosis Brother    Hypertension Daughter    Stroke Sister    Stroke Sister    Heart attack Sister    Deep vein thrombosis Sister    Hypertension Sister    Diabetes Sister    Arthritis Sister    Arthritis Sister    Arthritis Sister    Hypertension Sister    Heart attack Sister    Cancer Brother  KIDNEY   Kidney disease Brother    Deep vein thrombosis Brother    Seizures Brother    Heart attack Brother    Colon cancer Neg Hx    Colon polyps Neg Hx     Social History   Socioeconomic History   Marital status: Married    Spouse name: Consulting civil engineer   Number of children: 2   Years of education: GED   Highest education level: Not on file  Occupational History   Occupation: disability    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  Tobacco Use   Smoking status: Former    Packs/day: 1.00    Years: 45.00    Pack years: 45.00    Types: Cigarettes    Quit date: 11/03/2020    Years since quitting: 0.3   Smokeless tobacco: Never   Tobacco comments:    started back - pack per day  Vaping Use   Vaping Use: Never used  Substance and Sexual Activity   Alcohol use: Not Currently    Comment: none since 11/03/20   Drug use: No   Sexual activity: Yes  Other Topics Concern   Not on file  Social History Narrative   Lives with wife Christopher Lang   Leisure - couch potato   Social Determinants of Health   Financial Resource Strain: Low Risk    Difficulty of Paying Living Expenses: Not hard at all  Food Insecurity: No Food  Insecurity   Worried About Charity fundraiser in the Last Year: Never true   [place] in the Last Year: Never true  Transportation Needs: No Transportation Needs   Lack of Transportation (Medical): No   Lack of Transportation (Non-Medical): No  Physical Activity: Inactive   Days of Exercise per Week: 0 days   Minutes of Exercise per Session: 0 min  Stress: No Stress Concern Present   Feeling of Stress : Only a little  Social Connections: Not on file      Review of Systems: A 12 point ROS discussed and pertinent positives are indicated in the HPI above.  All other systems are negative.  Review of Systems  Vital Signs: BP (!) 114/54 (BP Location: Right Arm)    Pulse (!) 56    Temp 98.1 F (36.7 C) (Oral)    Resp 16    Ht 6' (1.829 m)    Wt 83.9 kg    SpO2 95%    BMI 25.09 kg/m   Physical Exam General: 66 yo male appearing stated age.  Well-developed, well-nourished.  No distress. HEENT: Atraumatic, normocephalic.  Conjugate gaze, extra-ocular motor intact. No scleral icterus or scleral injection. No lesions on external ears, nose, lips, or gums.  Oral mucosa moist, pink.  Neck: Symmetric with no goiter enlargement.  Chest/Lungs:  Symmetric chest with inspiration/expiration.  No labored breathing.  Clear to auscultation with no wheezes, rhonchi, or rales.  Heart:  No third heart sounds appreciated. No JVD appreciated. Known afib. Abdomen:  Soft, NT/ND, with + bowel sounds.   Genito-urinary: Deferred Neurologic: Alert & Oriented to person, place, and time.   Normal affect and insight.  Appropriate questions.   CN's intact.  Upper and lower motor intact.   No new weakness or sensory deficit.  Pulse Exam:  Right carotid bruit.  No bruit on the left.  Extremities: Right CFA access soft, no ecchymosis.  No swelling.  Warm extremities.    Imaging: IR US Guide Vasc Access Right  Result Date: 03/06/2021 INDICATION:  66 year old male presents for cervical and cerebral angiogram  and possible carotid stenting for high-grade left-sided asymptomatic stenosis EXAM: ULTRASOUND-GUIDED ACCESS RIGHT COMMON FEMORAL ARTERY CERVICAL AND CEREBRAL ANGIOGRAM CAROTID ARTERY STENTING, DISCONTINUED ANGIO-SEAL FOR HEMOSTASIS MEDICATIONS: 5000 units IV heparin ANESTHESIA/SEDATION: The anesthesia team was present to provide general endotracheal tube anesthesia and for patient monitoring during the procedure. Intubation was performed in biplane/neurointerventional suite. Left radial arterial line was performed by the anesthesia team. Interventional neuro radiology nursing staff was also present. FLUOROSCOPY: Radiation Exposure Index (as provided by the fluoroscopic device): 7,062 mGy Kerma COMPLICATIONS: None TECHNIQUE: Informed written consent was obtained from the patient's family after a thorough discussion of the procedural risks, benefits and alternatives. Specific risks discussed include: Bleeding, infection, contrast reaction, kidney injury/failure, need for further procedure/surgery, arterial injury or dissection, stroke/neurologic deterioration, inability to proceed with stenting, cardiopulmonary collapse, death. All questions were addressed. Maximal Sterile Barrier Technique was utilized including during the procedure including caps, mask, sterile gowns, sterile gloves, sterile drape, hand hygiene and skin antiseptic. A timeout was performed prior to the initiation of the procedure. The anesthesia team was present to provide general endotracheal tube anesthesia and for patient monitoring during the procedure. Interventional neuro radiology nursing staff was also present. PROCEDURE: After the anesthesia team initiated general anesthesia, a time-out was performed. The patient is prepped and draped in the usual sterile fashion, room 2 in the neuro IR suite. Ultrasound survey of the right inguinal region was performed with images stored and sent to PACs. Right CFA was documented to be patent. 11 blade  scalpel was used to make a small incision. Blunt dissection was performed with ultrasound guidance. A micropuncture needle was used access the right common femoral artery under ultrasound. With excellent arterial blood flow returned, an .018 micro wire was passed through the needle, observed to enter the abdominal aorta under fluoroscopy. The needle was removed, and a micropuncture sheath was placed over the wire. The inner dilator and wire were removed, and an 035 Bentson wire was advanced under fluoroscopy into the abdominal aorta. The sheath was removed and a standard 5 [place] vascular sheath was placed. The dilator was removed and the sheath was flushed. A 27F JB-1 diagnostic catheter was advanced over the wire to the proximal descending thoracic aorta. Wire was then removed. Double flush of the catheter was performed. We then proceeded with cerebral angiogram. Images were reviewed. We then proceeded with our treatment plan. 5000 units of IV heparin was administered. The JB 1 catheter was used to select the left common carotid artery. With an adequate catheter position in the common carotid artery, roadmap angiogram was performed. The catheter was then navigated with the assistance of a Glidewire into the external carotid artery branches. Wire was removed and a rosen wire was placed. Catheter was removed on the Rosen wire. The 5 French sheath was then removed and exchanged for 8 [place] standard sheath. Sheath was flushed and attached to pressurized and heparinized saline bag for constant forward flow. Then an 8 [place], 62 cm Walrus balloon tip catheter was prepared on the back table with inflation of the balloon with 50/50 concentration of dilute contrast. The balloon catheter was then advanced over the wire, positioned into the origin of the external carotid artery. Wire was removed, and under aspiration the catheter was withdrawn into the common carotid artery. Copious back flush was performed and the balloon  catheter was attached to heparinized and pressurized saline bag for forward flow. Angiogram was then performed. After  review of the images and measurements, we elected to proceed with an attempt to cross the multifocal disease of the bulb and proximal left internal carotid artery with wire and catheter combination using proximal balloon protection. This decision was due to the multifocal calcified plaque with 3 sequential critical stenoses after a segment of moderate calcified stenosis at the bulb. A synchro soft wire and a rapid transit microcatheter were navigated just beyond the balloon guide. Before we attempted to cross the lesion, the balloon on the balloon guide was inflated for stasis of flow/proximal protection. We then attempted to cross the multifocal plaque using first the synchro soft wire and the rapid transit catheter, then a combination of Aristotle wire and rapid transit catheter. We ultimately also attempted a headliner double angle 012 microwire with the rapid transit. Ultimately the critical stenosis in the third of 4 sequential plaque we were unable to cross. After approximately 22 minutes of fluoro time, we elected to discontinue the attempt. A final angiogram was performed. A contralateral angiogram of the right common carotid artery was performed confirming cross flow, with slip streaming from the in flowing ICA terminus, as well as slip streaming from some retrograde flow from the posterior circulation. Eight French sheath was removed and 10F Angioseal was deployed. Patient tolerated the procedure well and remained hemodynamically stable throughout. No complications were encountered and no significant blood loss encountered. Patient was extubated with neurologic exam intact. FINDINGS: Ultrasound demonstrates patent right common femoral artery Right common carotid artery: Patent right common carotid artery without significant tortuosity or atherosclerotic plaque. Right ICA: Patent stent at the  right carotid bifurcation. External carotid artery remains patent. Normal course caliber and contour of the distal right ICA with patent petrous segment, ophthalmic segment, catheter segment and terminus. Large caliber right A1 segment perfuses the right ACA territory with patent anterior communicating artery and adequate cross flow to the left hemisphere perfusing the ACA and left MCA territory. Right M1 and MCA branches patent. Left carotid: Angiogram left common carotid artery demonstrates less than 50% stenosis in the mid CCA secondary to calcified plaque. Advanced calcified and soft plaque at the left carotid bifurcation and the proximal left ICA. There is a series of 4 calcified plaques at the bulb involving the origin of the external carotid artery, and then 3 per calcified plaques involving the proximal left ICA with critical stenosis at each site. The highest degree of narrowing appears within the second of 4 plaques. Small caliber distal left ICA secondary to underfilling. Unchanged cervical and cerebral angiogram of both left and right ICA after discontinuation of the attempt to cross the sequential plaques of the left ICA. No irregularity or dissection identified within the left common carotid artery. Neuro exam intact after extubation of the patient. IMPRESSION: Status post ultrasound-guided access of right common femoral artery for cervical and cerebral angiogram and discontinued attempt of left ICA stenting as above. Angio-Seal for hemostasis. Signed, Dulcy Fanny. Dellia Nims, RPVI [facility] Electronically Signed   By: Corrie Mckusick D.O.   On: 03/06/2021 15:04   ECHOCARDIOGRAM COMPLETE  Result Date: 02/13/2021    ECHOCARDIOGRAM REPORT   Patient Name:   DAMONE FANCHER Date of Exam: 02/13/2021 Medical Rec #:  [id number]       Height:       72.0 in Accession #:    [id number]      Weight:       191.0 lb Date of Birth:  1955/06/19  BSA:           2.089 m Patient Age:    40 years        BP:           142/78 mmHg Patient Gender: M               HR:           50 bpm. Exam Location:  [place] Procedure: 2D Echo, 3D Echo, Cardiac Doppler, Color Doppler and Strain Analysis Indications:    I35.0 Nonrheumatic aortic (valve) stenosis  History:        Patient has prior history of Echocardiogram examinations, most                 recent 12/04/2020. Stroke and COPD, Arrythmias:Atrial                 Fibrillation; Risk Factors:Former Smoker, Hypertension and                 Dyslipidemia.  Sonographer:    Caesar Chestnut Referring Phys: [id number] Verta Ellen. IMPRESSIONS  1. The mid to distal anteroseptal, anterior, anterolateral inferoseptal walls are akinetic. The apex is akinetic. Marland Kitchen Left ventricular ejection fraction, by estimation, is 20 to 25%. The left ventricle has severely decreased function. The left ventricle demonstrates regional wall motion abnormalities (see scoring diagram/findings for description). Left ventricular diastolic parameters are consistent with Grade II diastolic dysfunction (pseudonormalization). Elevated left atrial pressure.  2. Right ventricular systolic function is normal. The right ventricular size is normal. Tricuspid regurgitation signal is inadequate for assessing PA pressure.  3. Left atrial size was mildly dilated.  4. The mitral valve is abnormal. Mild mitral valve regurgitation. No evidence of mitral stenosis.  5. The aortic valve is tricuspid. There is moderate calcification of the aortic valve. There is moderate thickening of the aortic valve. Aortic valve regurgitation is not visualized. No aortic stenosis is present.  6. The inferior vena cava is normal in size with greater than 50% respiratory variability, suggesting right atrial pressure of 3 mmHg. Comparison(s): LVEF 20-25%. FINDINGS  Left Ventricle: The mid to distal anteroseptal, anterior, anterolateral inferoseptal walls are akinetic. The apex is akinetic. Left ventricular  ejection fraction, by estimation, is 20 to 25%. The left ventricle has severely decreased function. The left ventricle demonstrates regional wall motion abnormalities. The left ventricular internal cavity size was normal in size. There is no left ventricular hypertrophy. Left ventricular diastolic parameters are consistent with Grade II diastolic dysfunction (pseudonormalization). Elevated left atrial pressure. Right Ventricle: The right ventricular size is normal. No increase in right ventricular wall thickness. Right ventricular systolic function is normal. Tricuspid regurgitation signal is inadequate for assessing PA pressure. Left Atrium: Left atrial size was mildly dilated. Right Atrium: Right atrial size was normal in size. Pericardium: There is no evidence of pericardial effusion. Mitral Valve: The mitral valve is abnormal. There is mild thickening of the mitral valve leaflet(s). There is mild calcification of the mitral valve leaflet(s). Mild mitral annular calcification. Mild mitral valve regurgitation. No evidence of mitral valve stenosis. Tricuspid Valve: The tricuspid valve is normal in structure. Tricuspid valve regurgitation is trivial. No evidence of tricuspid stenosis. Aortic Valve: The aortic valve is tricuspid. There is moderate calcification of the aortic valve. There is moderate thickening of the aortic valve. There is moderate aortic valve annular calcification. Aortic valve regurgitation is not visualized. No aortic stenosis is present. Aortic valve mean gradient measures 5.0 mmHg. Aortic valve peak  gradient measures 7.8 mmHg. Aortic valve area, by VTI measures 2.07 cm. Pulmonic Valve: The pulmonic valve was not well visualized. Pulmonic valve regurgitation is mild. No evidence of pulmonic stenosis. Aorta: The aortic root is normal in size and structure. Venous: The inferior vena cava is normal in size with greater than 50% respiratory variability, suggesting right atrial pressure of 3 mmHg.  IAS/Shunts: No atrial level shunt detected by color flow Doppler.  LEFT VENTRICLE PLAX 2D LVIDd:         5.94 cm      Diastology LVIDs:         4.84 cm      LV e' medial:    5.00 cm/s LV PW:         0.99 cm      LV E/e' medial:  23.8 LV IVS:        0.90 cm      LV e' lateral:   4.91 cm/s LVOT diam:     2.10 cm      LV E/e' lateral: 24.2 LV SV:         73 LV SV Index:   35           2D Longitudinal Strain LVOT Area:     3.46 cm     2D Strain GLS Avg:     -9.2 %  LV Volumes (MOD) LV vol d, MOD A2C: 179.0 ml LV vol d, MOD A4C: 184.0 ml LV vol s, MOD A2C: 117.0 ml LV vol s, MOD A4C: 116.0 ml LV SV MOD A2C:     62.0 ml LV SV MOD A4C:     184.0 ml LV SV MOD BP:      61.0 ml RIGHT VENTRICLE RV Basal diam:  4.05 cm RV Mid diam:    2.29 cm RV S prime:     10.20 cm/s TAPSE (M-mode): 2.2 cm RIGHT ATRIUM           Index RA Area:     14.50 cm RA Volume:   36.00 ml  17.23 ml/m  AORTIC VALVE                     PULMONIC VALVE AV Area (Vmax):    2.21 cm      PV Vmax:          0.71 m/s AV Area (Vmean):   2.16 cm      PV Peak grad:     2.0 mmHg AV Area (VTI):     2.07 cm      PR End Diast Vel: 5.86 msec AV Vmax:           140.00 cm/s AV Vmean:          107.000 cm/s AV VTI:            0.353 m AV Peak Grad:      7.8 mmHg AV Mean Grad:      5.0 mmHg LVOT Vmax:         89.40 cm/s LVOT Vmean:        66.700 cm/s LVOT VTI:          0.211 m LVOT/AV VTI ratio: 0.60  AORTA Ao Root diam: 3.20 cm Ao Asc diam:  3.00 cm MITRAL VALVE MV Area (PHT): 3.65 cm       SHUNTS MV Decel Time: 208 msec       Systemic VTI:  0.21 m Christopher Peak grad:    74.6  mmHg    Systemic Diam: 2.10 cm Christopher Mean grad:    53.5 mmHg Christopher Vmax:         432.00 cm/s Christopher Vmean:        350.5 cm/s Christopher PISA:         24.91 cm Christopher PISA Eff ROA: 4 mm Christopher PISA Radius:  0.30 cm MV E velocity: 119.00 cm/s MV A velocity: 67.60 cm/s MV E/A ratio:  1.76 Carlyle Dolly MD Electronically signed by Carlyle Dolly MD Signature Date/Time: 02/13/2021/4:46:57 PM    Final    IR ANGIO INTRA EXTRACRAN  SEL COM CAROTID INNOMINATE BILAT MOD SED  Result Date: 03/06/2021 INDICATION: 66 year old male presents for cervical and cerebral angiogram and possible carotid stenting for high-grade left-sided asymptomatic stenosis EXAM: ULTRASOUND-GUIDED ACCESS RIGHT COMMON FEMORAL ARTERY CERVICAL AND CEREBRAL ANGIOGRAM CAROTID ARTERY STENTING, DISCONTINUED ANGIO-SEAL FOR HEMOSTASIS MEDICATIONS: 5000 units IV heparin ANESTHESIA/SEDATION: The anesthesia team was present to provide general endotracheal tube anesthesia and for patient monitoring during the procedure. Intubation was performed in biplane/neurointerventional suite. Left radial arterial line was performed by the anesthesia team. Interventional neuro radiology nursing staff was also present. FLUOROSCOPY: Radiation Exposure Index (as provided by the fluoroscopic device): 5,188 mGy Kerma COMPLICATIONS: None TECHNIQUE: Informed written consent was obtained from the patient's family after a thorough discussion of the procedural risks, benefits and alternatives. Specific risks discussed include: Bleeding, infection, contrast reaction, kidney injury/failure, need for further procedure/surgery, arterial injury or dissection, stroke/neurologic deterioration, inability to proceed with stenting, cardiopulmonary collapse, death. All questions were addressed. Maximal Sterile Barrier Technique was utilized including during the procedure including caps, mask, sterile gowns, sterile gloves, sterile drape, hand hygiene and skin antiseptic. A timeout was performed prior to the initiation of the procedure. The anesthesia team was present to provide general endotracheal tube anesthesia and for patient monitoring during the procedure. Interventional neuro radiology nursing staff was also present. PROCEDURE: After the anesthesia team initiated general anesthesia, a time-out was performed. The patient is prepped and draped in the usual sterile fashion, room 2 in the neuro IR suite.  Ultrasound survey of the right inguinal region was performed with images stored and sent to PACs. Right CFA was documented to be patent. 11 blade scalpel was used to make a small incision. Blunt dissection was performed with ultrasound guidance. A micropuncture needle was used access the right common femoral artery under ultrasound. With excellent arterial blood flow returned, an .018 micro wire was passed through the needle, observed to enter the abdominal aorta under fluoroscopy. The needle was removed, and a micropuncture sheath was placed over the wire. The inner dilator and wire were removed, and an 035 Bentson wire was advanced under fluoroscopy into the abdominal aorta. The sheath was removed and a standard 5 [place] vascular sheath was placed. The dilator was removed and the sheath was flushed. A 59F JB-1 diagnostic catheter was advanced over the wire to the proximal descending thoracic aorta. Wire was then removed. Double flush of the catheter was performed. We then proceeded with cerebral angiogram. Images were reviewed. We then proceeded with our treatment plan. 5000 units of IV heparin was administered. The JB 1 catheter was used to select the left common carotid artery. With an adequate catheter position in the common carotid artery, roadmap angiogram was performed. The catheter was then navigated with the assistance of a Glidewire into the external carotid artery branches. Wire was removed and a rosen wire was placed. Catheter was removed on the Rosen wire. The  5 French sheath was then removed and exchanged for 8 [place] standard sheath. Sheath was flushed and attached to pressurized and heparinized saline bag for constant forward flow. Then an 8 [place], 42 cm Walrus balloon tip catheter was prepared on the back table with inflation of the balloon with 50/50 concentration of dilute contrast. The balloon catheter was then advanced over the wire, positioned into the origin of the external carotid artery.  Wire was removed, and under aspiration the catheter was withdrawn into the common carotid artery. Copious back flush was performed and the balloon catheter was attached to heparinized and pressurized saline bag for forward flow. Angiogram was then performed. After review of the images and measurements, we elected to proceed with an attempt to cross the multifocal disease of the bulb and proximal left internal carotid artery with wire and catheter combination using proximal balloon protection. This decision was due to the multifocal calcified plaque with 3 sequential critical stenoses after a segment of moderate calcified stenosis at the bulb. A synchro soft wire and a rapid transit microcatheter were navigated just beyond the balloon guide. Before we attempted to cross the lesion, the balloon on the balloon guide was inflated for stasis of flow/proximal protection. We then attempted to cross the multifocal plaque using first the synchro soft wire and the rapid transit catheter, then a combination of Aristotle wire and rapid transit catheter. We ultimately also attempted a headliner double angle 012 microwire with the rapid transit. Ultimately the critical stenosis in the third of 4 sequential plaque we were unable to cross. After approximately 22 minutes of fluoro time, we elected to discontinue the attempt. A final angiogram was performed. A contralateral angiogram of the right common carotid artery was performed confirming cross flow, with slip streaming from the in flowing ICA terminus, as well as slip streaming from some retrograde flow from the posterior circulation. Eight French sheath was removed and 33F Angioseal was deployed. Patient tolerated the procedure well and remained hemodynamically stable throughout. No complications were encountered and no significant blood loss encountered. Patient was extubated with neurologic exam intact. FINDINGS: Ultrasound demonstrates patent right common femoral artery Right  common carotid artery: Patent right common carotid artery without significant tortuosity or atherosclerotic plaque. Right ICA: Patent stent at the right carotid bifurcation. External carotid artery remains patent. Normal course caliber and contour of the distal right ICA with patent petrous segment, ophthalmic segment, catheter segment and terminus. Large caliber right A1 segment perfuses the right ACA territory with patent anterior communicating artery and adequate cross flow to the left hemisphere perfusing the ACA and left MCA territory. Right M1 and MCA branches patent. Left carotid: Angiogram left common carotid artery demonstrates less than 50% stenosis in the mid CCA secondary to calcified plaque. Advanced calcified and soft plaque at the left carotid bifurcation and the proximal left ICA. There is a series of 4 calcified plaques at the bulb involving the origin of the external carotid artery, and then 3 per calcified plaques involving the proximal left ICA with critical stenosis at each site. The highest degree of narrowing appears within the second of 4 plaques. Small caliber distal left ICA secondary to underfilling. Unchanged cervical and cerebral angiogram of both left and right ICA after discontinuation of the attempt to cross the sequential plaques of the left ICA. No irregularity or dissection identified within the left common carotid artery. Neuro exam intact after extubation of the patient. IMPRESSION: Status post ultrasound-guided access of right common femoral artery for  cervical and cerebral angiogram and discontinued attempt of left ICA stenting as above. Angio-Seal for hemostasis. Signed, Dulcy Fanny. Dellia Nims, RPVI [facility] Radiology Electronically Signed   By: Corrie Mckusick D.O.   On: 03/06/2021 15:04    Labs:  CBC: Recent Labs    11/06/20 0425 11/07/20 0302 11/08/20 0145 03/06/21 0717  WBC 9.5 9.3 9.0 6.0  HGB 12.9* 12.8* 12.4*  13.1  HCT 37.4* 37.1* 35.8* 38.8*  PLT 134* 129* 137* 176    COAGS: Recent Labs    11/03/20 0603 03/06/21 0717  INR 1.0 1.1  APTT 39*  --     BMP: Recent Labs    11/06/20 0425 11/07/20 0302 11/08/20 0145 12/02/20 1337 12/19/20 1036 03/06/21 0717  NA 129* 135 135 141 140 139  K 2.9* 3.4* 3.1* 3.4* 4.0 3.8  CL 100 104 105 103 101 106  CO2 _0 GLUCOSE 99 90 102* 89 103* 117*  BUN _1 CALCIUM 8.9 9.2 9.2 10.4* 10.3* 9.9  CREATININE 0.80 0.76 0.82 0.88 0.90 0.94  GFRNONAA >60 >60 >60  --   --  >60    LIVER FUNCTION TESTS: Recent Labs    11/03/20 0603  BILITOT 1.8*  AST 83*  ALT 30  ALKPHOS 131*  PROT 7.5  ALBUMIN 4.0    TUMOR MARKERS: No results for input(s): AFPTM, CEA, CA199, CHROMGRNA in the last 8760 hours.  Assessment and Plan:  Christopher Nienhaus is a 65 yo male with cerebral vascular disease, including right ICA/MCA acute stroke October 2022, requiring acute thrombectomy with right ICA stenting, as well as critical stenosis of the left ICA.   His prior October admission for stroke was complicated by STEMI, which was treated medically.   He presented on this stay for left ICA stenting, which was discontinued due to inability to navigate multiple critical stenosis at the bulb/ICA.   He has recovered well, with no symptoms.    I had a lengthy discussion with Christopher Lewan and his wife regarding the natural history of large artery disease and stroke, the baseline therapy of maximal medical therapy, and other options for treatment including surgical endarterectomy.    I did let him know that I am happy to refer him to one of our surgical colleagues to discuss if they would like.  He and his wife would like to attend their upcoming cardiology appointment to discuss cardiac risk.   We will set up appointment in our office in 4-6 weeks, at his convenience.   Plan: - 4-6 week follow up with Dr. Earleen Newport in clinic - repeat carotid duplex before  the follow up . - maintain maximal medical therapy, with no change.  Including restarting eliquis today on schedule, and continue plavix daily - routine wound care for the right CFA access site     Electronically Signed: Corrie Mckusick, DO 03/07/2021, 8:19 AM

## 2021-03-07 NOTE — Discharge Instructions (Signed)
Keep groin site clean and dry, changing bandage after showering or sooner if soiled. Restart Eliquis today Keep taking Plavix

## 2021-03-07 NOTE — Progress Notes (Signed)
Christopher Lang to be D/C'd home per MD order. Discussed with the patient and all questions fully answered.  Skin clean, dry and intact without evidence of skin break down, no evidence of skin tears noted.  IV catheter discontinued intact. Site without signs and symptoms of complications. Dressing and pressure applied. Dressing to right groin level 0. An After Visit Summary was printed and given to the patient.  Patient escorted via [place], and D/C home via private auto.  [name]  03/07/2021 10:35 AM

## 2021-03-07 NOTE — TOC Transition Note (Signed)
Transition of Care [facility]) - CM/SW Discharge Note   Patient Details  Name: Christopher Lang MRN: [id number] Date of Birth: August 08, 1955  [facility]) CM/SW Contact:  Pollie Friar, RN Phone Number: 03/07/2021, 10:04 AM   Clinical Narrative:    Patient discharging home with self care. No needs per TOC.   Final next level of care: Home/Self Care Barriers to Discharge: No Barriers Identified   Patient Goals and CMS Choice        Discharge Placement                       Discharge Plan and Services                                     Social Determinants of Health (SDOH) Interventions     Readmission Risk Interventions No flowsheet data found.

## 2021-03-07 NOTE — Plan of Care (Signed)
  Problem: Health Behavior/Discharge Planning: Goal: Ability to manage health-related needs will improve Outcome: Progressing   

## 2021-03-10 ENCOUNTER — Other Ambulatory Visit: Payer: Self-pay | Admitting: *Deleted

## 2021-03-10 NOTE — Patient Outreach (Signed)
[facility]) Care Management  03/10/2021  Christopher Lang 1955/03/14 [id number]   Member had carotid stent placed on 2/16, discharged home on 2/17.  Call placed to follow up on recovery, no answer, unable to leave voice message.  Will follow up within the next 3-4 business days.  [name], RN, MSN, [place] Manager [phone]

## 2021-03-13 ENCOUNTER — Other Ambulatory Visit: Payer: Self-pay | Admitting: *Deleted

## 2021-03-13 NOTE — Patient Outreach (Signed)
[place] [facility]) Care Management  03/13/2021  STACIE KNUTZEN 06/08/1955 [id number]   Outreach attempt #2, unsuccessful, unable to leave voice message. Will send outreach letter and follow up within the next 3-4 business days.  [name], RN, MSN, [place] Manager [phone]

## 2021-03-13 NOTE — Discharge Summary (Signed)
Discharge Date:  03/07/2021

## 2021-03-18 ENCOUNTER — Other Ambulatory Visit: Payer: Self-pay | Admitting: *Deleted

## 2021-03-18 NOTE — Patient Outreach (Signed)
[place] [facility]) Care Management Telephonic RN Care Manager Note   03/18/2021 Name:  JAYSE HODKINSON MRN:  [id number] DOB:  Jan 28, 1955  Summary: Outreach attempt #2, successful to member.  Denies any urgent concerns, encouraged to contact this care manager with questions.    Recommendations/Changes made from today's visit: Take blood pressure to monitor to MD visit tomorrow to determine accuracy.  Monitor blood pressure and heart rate daily, recording readings in log to share with providers.  Subjective: JAHREE DERMODY is an 66 y.o. year old male who is a primary patient of Hasanaj, Samul Dada, MD. The care management team was consulted for assistance with care management and/or care coordination needs.    Telephonic RN Care Manager completed Telephone Visit today.  Objective:   Medications Reviewed Today     Reviewed by Nena Polio, RN (Registered Nurse) on 03/06/21 at [phone]  Med List Status: Complete   Medication Order Taking? Sig Documenting Provider Last Dose Status Informant  apixaban (ELIQUIS) 5 MG TABS tablet [id number] No Take 1 tablet (5 mg total) by mouth 2 (two) times daily. Satira Sark, MD 03/03/2021 Active   atorvastatin (LIPITOR) 40 MG tablet [id number] Yes Take 1 tablet (40 mg total) by mouth daily at 6 PM. [name], PA-C 03/05/2021 Active Self  clopidogrel (PLAVIX) 75 MG tablet [id number] Yes Take 1 tablet (75 mg total) by mouth daily. Parke Poisson 03/04/2021 Active Self  Melatonin 10 MG CAPS [id number]  Take 10 mg by mouth at bedtime as needed (sleep). [provider]  Active Self           Med Note Jilda Roche A   Mon Jan 06, 2021 11:59 AM) Hasnt started  metoprolol succinate (TOPROL XL) 25 MG 24 hr tablet [id number] Yes Take 1 tablet (25 mg total) by mouth daily. Erma Heritage, PA-C 03/04/2021 Active Self  sacubitril-valsartan (ENTRESTO) 49-51 MG [id number] Yes Take 1 tablet by mouth 2 (two) times daily. Verta Ellen., NP 03/04/2021 Active Self  spironolactone (ALDACTONE) 25 MG tablet [id number] Yes Take 0.5 tablets (12.5 mg total) by mouth daily.  Patient taking differently: Take 25 mg by mouth daily.   [name], PA-C 03/04/2021 Active Self  varenicline (CHANTIX) 0.5 MG tablet [id number] Yes Take 1 tablet (0.5 mg total) by mouth 2 (two) times daily. Parke Poisson 03/04/2021 Active Self             SDOH:  (Social Determinants of Health) assessments and interventions performed:     Care Plan  Review of patient past medical history, allergies, medications, health status, including review of consultants reports, laboratory and other test data, was performed as part of comprehensive evaluation for care management services.   Care Plan : [facility] Plan of Care (Adult)  Updates made by [name], RN since 03/18/2021 12:00 AM     Problem: Knowledge deficit related to management of chronic conditions (HTN, CHF, and A-fib) evidenced by recent stroke   Priority: High     Long-Range Goal: Management of chronic conditions (A-fib, CHF, HTN) and recovery from stroke evidenced by no readmissions   Start Date: 11/14/2020  Expected End Date: 05/13/2021  This Visit's Progress: On track  Recent Progress: On track  Priority: High  Note:   Current Barriers:  Knowledge Deficits related to plan of care for management of Atrial Fibrillation, CHF, HTN, and Stroke Chronic Disease Management support and education needs related to Atrial Fibrillation, CHF, HTN,  and Stroke  RNCM Clinical Goal(s):  Patient will verbalize understanding of plan for management of Atrial Fibrillation, CHF, HTN, and stroke as evidenced by verbalization of plan of care for adequate management take all medications exactly as prescribed and will call provider for medication related questions as evidenced by member reported adherence attend all scheduled medical appointments: Neurology, cardiology & IR as evidenced by  Neurology, cardiology & IR not experience hospital admission as evidenced by review of EMR. Hospital Admissions in last 6 months = 6  through collaboration with RN Care manager, provider, and care team.   Interventions:  Inter-disciplinary care team collaboration (see longitudinal plan of care) Evaluation of current treatment plan related to  self management and patient's adherence to plan as established by provider   Hypertension Interventions: Last practice recorded BP readings:  BP Readings from Last 3 Encounters:  03/07/21 (!) 114/54  01/23/21 (!) 164/87  01/02/21 (!) 178/81  Most recent eGFR/CrCl: No results found for: EGFR  No components found for: CRCL  Evaluation of current treatment plan related to hypertension self management and patient's adherence to plan as established by provider; Provided education to patient re: stroke prevention, s/s of heart attack and stroke; Reviewed medications with patient and discussed importance of compliance; Advised patient, providing education and rationale, to monitor blood pressure daily and record, calling PCP for findings outside established parameters;   Heart Failure Interventions: Provided education on low sodium diet; Reviewed Heart Failure Action Plan in depth and provided written copy; Assessed need for readable accurate scales in home; Provided education about placing scale on hard, flat surface; Discussed importance of daily weight and advised patient to weigh and record daily; Reviewed role of diuretics in prevention of fluid overload and management of heart failure; Discussed the importance of keeping all appointments with provider;  Update 10/27 - Report weight today is 188.1 pounds, wife state this has been stable.  AFIB Interventions:    Counseled on increased risk of stroke due to Afib and benefits of anticoagulation for stroke prevention; Reviewed importance of adherence to anticoagulant exactly as  prescribed; Counseled on bleeding risk associated with Eliquis and Plavis and importance of self-monitoring for signs/symptoms of bleeding;   Update 11/15 - Member report speech therapist is currently in the home to work with him.  Has follow up appointment with neurology on 12/15 and will start cardiac rehab on 12/21.  Was seen by cardiology on 11/3, BP, HR, and weights were all stable.  No reported issues noted, no medication changes.  Will follow up with cardiology in 6 months.   Patient Goals/Self-Care Activities: Patient will self administer medications as prescribed Patient will attend all scheduled provider appointments Patient will continue to perform ADL's independently Patient will continue to perform IADL's independently  Follow Up Plan:  Telephone follow up appointment with care management team member scheduled for:  04/15/2021 The patient has been provided with contact information for the care management team and has been advised to call with any health related questions or concerns.    Update 12/13 - Report he has continued to improve well with therapy at home, speech is much clearer today than with initial assessment.  He has follow up with neuro this Thursday and will have orientation for cardiac rehab next week.  Once orientation is complete, he will have his rehab schedule.  State that he is tolerating his increased dose of Entresto without problems (dose changed on 11/18).  He continues to monitor blood pressure, HR, and weight  daily, unable to get to notebook for readings today but state they have been normal.  Does report weight was 185 pounds this morning, decreased from last outreach.  Report concern for problems with sleeping, inquires if any medications could cause this.  He is taking Chantix, side effect of insomnia.  He will discuss with provider during office visit this week.  State he also has follow up with PCP on 1/3.   Update 1/10 - Member report doing well, was seen  by neurology, PCP, and IR since last outreach.  PCP increased Spironolactone to a full tablet daily instead of half due to elevated blood pressures, state it has not started to decrease.  Last reading was 140/78.  Aware that he will need carotid procedure for revascularization, waiting for IR office to call back to schedule surgery date.  He has planned to stay in hospital at least overnight, denies questions about procedure.    Update 2/7 - Spoke to member, has been seen by cardiology for Echo, EF remains 20-25%.  Will have medication changes (Metoprolol to Toprol XL), waiting for this to be delivered, cardiology follow up scheduled for 3/1.  There has been discussion regarding ICD placement if condition does not improve.  Weight has been stable, today was 184 pounds. Report blood pressure range 140-150/70-80 and HR range 40-50's.  He was scheduled to have cardiac rehab orientation however decision was made to wait until after carotid procedure.  He is still waiting on call to schedule.  Call was place to IR, spoke with Olegario Shearer, state she requested [facility] scheduler, Anderson Malta, to call for date.  Call placed to Anderson Malta [phone]), message left to call this care manager and/or member directly.   Update 2/28 - Member report he went to hospital for carotid procedure but did not have it.  State provider decided to manage member medically instead.  He will have follow up with cardiology tomorrow and with neurology on 5/15.  He state he has not been consistent with managing blood pressure or heart rate daily, does not know if machine is accurate.  He will take it with him to appointment tomorrow to compare.  Denies any signs/symptoms of stroke, no chest pain or discomfort.        Plan:  Telephone follow up appointment with care management team member scheduled for:  1 month The patient has been provided with contact information for the care management team and has been advised to call with any health  related questions or concerns.   [name], RN, MSN, [place] Manager [phone]

## 2021-03-19 ENCOUNTER — Ambulatory Visit: Payer: Medicare Other | Admitting: Cardiology

## 2021-03-19 ENCOUNTER — Encounter: Payer: Self-pay | Admitting: Cardiology

## 2021-03-19 ENCOUNTER — Other Ambulatory Visit: Payer: Self-pay | Admitting: Interventional Radiology

## 2021-03-19 ENCOUNTER — Other Ambulatory Visit: Payer: Self-pay

## 2021-03-19 VITALS — BP 152/78 | HR 70 | Ht 72.0 in | Wt 187.0 lb

## 2021-03-19 DIAGNOSIS — I2510 Atherosclerotic heart disease of native coronary artery without angina pectoris: Secondary | ICD-10-CM

## 2021-03-19 DIAGNOSIS — I10 Essential (primary) hypertension: Secondary | ICD-10-CM

## 2021-03-19 DIAGNOSIS — Z720 Tobacco use: Secondary | ICD-10-CM

## 2021-03-19 DIAGNOSIS — I63411 Cerebral infarction due to embolism of right middle cerebral artery: Secondary | ICD-10-CM

## 2021-03-19 DIAGNOSIS — I4891 Unspecified atrial fibrillation: Secondary | ICD-10-CM

## 2021-03-19 DIAGNOSIS — I255 Ischemic cardiomyopathy: Secondary | ICD-10-CM

## 2021-03-19 DIAGNOSIS — Z79899 Other long term (current) drug therapy: Secondary | ICD-10-CM | POA: Diagnosis not present

## 2021-03-19 DIAGNOSIS — E782 Mixed hyperlipidemia: Secondary | ICD-10-CM

## 2021-03-19 DIAGNOSIS — I6529 Occlusion and stenosis of unspecified carotid artery: Secondary | ICD-10-CM

## 2021-03-19 MED ORDER — DAPAGLIFLOZIN PROPANEDIOL 10 MG PO TABS: 10.0000 mg | ORAL_TABLET | Freq: Every day | ORAL | 11 refills | Status: DC

## 2021-03-19 MED ORDER — APIXABAN 5 MG PO TABS: 5.0000 mg | ORAL_TABLET | Freq: Two times a day (BID) | ORAL | 3 refills | Status: DC

## 2021-03-19 MED ORDER — ENTRESTO 97-103 MG PO TABS
1.0000 | ORAL_TABLET | Freq: Two times a day (BID) | ORAL | 11 refills | Status: DC
Start: 2021-03-19 — End: 2022-03-30

## 2021-03-19 NOTE — Patient Instructions (Signed)
Medication Instructions:  ? ?Start Farxiga 10 mg Daily  ?Increase Entresto to 97/103 Mg Two Times Daily  ? ?*If you need a refill on your cardiac medications before your next appointment, please call your pharmacy* ? ? ?Lab Work: ?Your physician recommends that you return for lab work in: 2 Weeks (04/02/21)  ? ?If you have labs (blood work) drawn today and your tests are completely normal, you will receive your results only by: ?MyChart Message (if you have MyChart) OR ?A paper copy in the mail ?If you have any lab test that is abnormal or we need to change your treatment, we will call you to review the results. ? ? ?Testing/Procedures: ?NONE  ? ? ?Follow-Up: ?At [facility], you and your health needs are our priority.  As part of our continuing mission to provide you with exceptional heart care, we have created designated Provider Care Teams.  These Care Teams include your primary Cardiologist (physician) and Advanced Practice Providers (APPs -  Physician Assistants and Nurse Practitioners) who all work together to provide you with the care you need, when you need it. ? ?We recommend signing up for the patient portal called "MyChart".  Sign up information is provided on this After Visit Summary.  MyChart is used to connect with patients for Virtual Visits (Telemedicine).  Patients are able to view lab/test results, encounter notes, upcoming appointments, etc.  Non-urgent messages can be sent to your provider as well.   ?To learn more about what you can do with MyChart, go to [email or web address].   ? ?Your next appointment:   ?4 week(s) ? ?The format for your next appointment:   ?In Person ? ?Provider:   ?You may see Rozann Lesches, MD or one of the following Advanced Practice Providers on your designated Care Team:   ?Bernerd Pho, PA-C  ?Ermalinda Barrios, PA-C   ? ? ?Other Instructions ?Thank you for choosing [facility]! ? ? ? ?

## 2021-03-19 NOTE — Progress Notes (Addendum)
Cardiology Office Note   Date:  03/19/2021   ID:  Christopher Lang, DOB 1955-04-13, MRN [id number]  PCP:  Neale Burly, MD  Cardiologist:    Dr. Domenic Polite     Chief Complaint  Patient presents with   Cardiomyopathy      History of Present Illness: Christopher Lang is a 66 y.o. male who presents for follow up of cardiomyopathy due to STEMI in Oct without reperfusion.  History of CVA, CAD, STEMI, COPD, HTN, HLD, OSA, history of atrial fibrillation.  Recent admission on 11/03/2020 with acute right MCA stroke due to right M1 occlusion status post TNKase and IR with TICI 3 revascularization, acute ST elevation MI.  He presented with sudden onset of left-sided weakness was nausea and dry heaving the prior day.  He got up the next morning early with some chest pain and feeling flushed.  By the time EMS arrived he developed left-sided weakness.  EKG per EMS with anterior STEMI.  He was found to have anterior STEMI and right MCA occlusion.   Status post STEMI troponins were greater than 24,000.  Echocardiogram was 20 to 25% EF with mid to distal septum/anterior segments akinetic.  Entire apex was akinetic.  Myocardium appeared thin.  No LV thrombus.  His diltiazem was discontinued along with aspirin, Pletal, and and Protonix.  He was started on Eliquis 5 mg p.o. twice daily, Plavix 75 mg daily, atorvastatin 40 mg daily, omega-3 fatty acids 1000 mg daily.  Lopressor 12.5 mg p.o. twice daily, Entresto 24/26 mg p.o. twice daily, Aldactone 12.5 mg p.o. daily.  No cardiac cath was done due to CVA.    He presents today for follow-up status post CVA and STEMI.  He is doing well without any anginal or exertional symptoms.  No obvious focal neurologic deficits since CVA.  Recent cerebral angiogram yesterday via right CFA access.  Attempt was made to perform left ICA stenting, and was discontinued secondary to multiple sequential critical stenosis secondary to calcified plaque and inability to navigate.    Today he is here to discuss his recent echo with EF is 20-25%, regional wall motion abnormalities G2 DD. RV is normal. Mild MR, he is on entresto and aldactone along with toprol XL.  He continues on plavix and eliquis.  No chest pain, no SOB.  He has some left leg weakness post CVA.     Past Medical History:  Diagnosis Date   Acute CVA (cerebrovascular accident) ([place]) 11/03/2020   Acute left-sided weakness 11/03/2020   Acute ST elevation myocardial infarction (STEMI) ([place]) 11/03/2020   Carotid artery disease [facility])    Cataract    Removed Dec 2017/Jan 2018   CHF (congestive heart failure) ([facility])    COPD (chronic obstructive pulmonary disease) ([facility])    Coronary artery disease    Dyspnea    Essential hypertension    H. pylori infection 11/2016   With documented eradication via biopsy Feb 2019   Heart murmur    History of atrial fibrillation    November 2017   Hyperlipidemia    Peripheral vascular disease ([place])    Sleep apnea    Did not tolerate CPAP   Tubulovillous adenoma of rectum 04/2010    Past Surgical History:  Procedure Laterality Date   CATARACT EXTRACTION W/PHACO Right 01/16/2016   Procedure: CATARACT EXTRACTION PHACO AND INTRAOCULAR LENS PLACEMENT RIGHT EYE CDE= 6.97;  Surgeon: Tonny Branch, MD;  Location: [facility];  Service: Ophthalmology;  Laterality: Right;  right  CATARACT EXTRACTION W/PHACO Left 01/30/2016   Procedure: CATARACT EXTRACTION PHACO AND INTRAOCULAR LENS PLACEMENT LEFT EYE CDE 3.13;  Surgeon: Tonny Branch, MD;  Location: [facility];  Service: Ophthalmology;  Laterality: Left;  left   COLONOSCOPY  2012   COLONOSCOPY N/A 12/04/2016   three 4-6 mm tubular adenomas, moderate size external and internal hemorrhoids, redundant left colon, 3 year surveillance   ESOPHAGOGASTRODUODENOSCOPY N/A 12/04/2016   Dr. Oneida Alar: normal esophagus, small hiatal hernia, chronic gastritis with H.pylori, peptic duodenitis, atypical lymphoid proliferation. Surveillance EGD Feb 2019  without abnormal cells, negative H.pylori   ESOPHAGOGASTRODUODENOSCOPY N/A 03/15/2017   mild gastritis, no atypical cells, negative H.pylori   EXCISION, SKI  08/2016   R THIGH   IR ANGIO INTRA EXTRACRAN SEL COM CAROTID INNOMINATE BILAT MOD SED  11/06/2020   IR ANGIO INTRA EXTRACRAN SEL COM CAROTID INNOMINATE BILAT MOD SED  03/06/2021   IR CT HEAD LTD  11/03/2020   IR INTRAVSC STENT CERV CAROTID W/EMB-PROT MOD SED INCL ANGIO  11/06/2020   IR PERCUTANEOUS ART THROMBECTOMY/INFUSION INTRACRANIAL INC DIAG ANGIO  11/03/2020   IR PTA INTRACRANIAL  11/04/2020   IR RADIOLOGIST EVAL & MGMT  01/23/2021   IR US GUIDE VASC ACCESS LEFT  11/03/2020   IR US GUIDE VASC ACCESS RIGHT  11/03/2020   IR US GUIDE VASC ACCESS RIGHT  11/06/2020   IR US GUIDE VASC ACCESS RIGHT  03/06/2021   RADIOLOGY WITH ANESTHESIA N/A 11/03/2020   Procedure: RADIOLOGY WITH ANESTHESIA;  Surgeon: Radiologist, Medication, MD;  Location: [place];  Service: Radiology;  Laterality: N/A;   RADIOLOGY WITH ANESTHESIA N/A 11/06/2020   Procedure: STENTING;  Surgeon: Corrie Mckusick, DO;  Location: [place];  Service: Anesthesiology;  Laterality: N/A;   RADIOLOGY WITH ANESTHESIA Left 03/06/2021   Procedure: CAROTID STENT;  Surgeon: Corrie Mckusick, DO;  Location: [place];  Service: Anesthesiology;  Laterality: Left;     Current Outpatient Medications  Medication Sig Dispense Refill   atorvastatin (LIPITOR) 40 MG tablet Take 1 tablet (40 mg total) by mouth daily at 6 PM. 60 tablet 0   clopidogrel (PLAVIX) 75 MG tablet Take 1 tablet (75 mg total) by mouth daily. 60 tablet 0   dapagliflozin propanediol (FARXIGA) 10 MG TABS tablet Take 1 tablet (10 mg total) by mouth daily before breakfast. 30 tablet 11   metoprolol succinate (TOPROL XL) 25 MG 24 hr tablet Take 1 tablet (25 mg total) by mouth daily. 90 tablet 1   sacubitril-valsartan (ENTRESTO) 97-103 MG Take 1 tablet by mouth 2 (two) times daily. 60 tablet 11   spironolactone (ALDACTONE) 25 MG tablet Take  25 mg by mouth daily.     varenicline (CHANTIX) 0.5 MG tablet Take 1 tablet (0.5 mg total) by mouth 2 (two) times daily. 240 tablet 0   apixaban (ELIQUIS) 5 MG TABS tablet Take 1 tablet (5 mg total) by mouth 2 (two) times daily. 180 tablet 3   Melatonin 10 MG CAPS Take 10 mg by mouth at bedtime as needed (sleep). (Patient not taking: Reported on 03/19/2021)     No current facility-administered medications for this visit.    Allergies:   Patient has no known allergies.    Social History:  The patient  reports that he quit smoking about 4 months ago. His smoking use included cigarettes. He has a 45.00 pack-year smoking history. He has never used smokeless tobacco. He reports that he does not currently use alcohol. He reports that he does not use drugs.  Family History:  The patient's family history includes Arthritis in his sister, sister, and sister; CAD in his mother; Cancer in his brother and brother; Deep vein thrombosis in his brother, brother, and sister; Diabetes in his sister and sister; Early death in his father; Heart attack in his brother, mother, sister, sister, and sister; High blood pressure in his mother; Hypertension in his daughter, sister, and sister; Kidney disease in his brother; Leukemia in his father; Seizures in his brother; Stroke in his sister and sister.    ROS:  General:no colds or fevers, no weight changes Skin:no rashes or ulcers HEENT:no blurred vision, no congestion CV:see HPI PUL:see HPI GI:no diarrhea constipation or melena, no indigestion GU:no hematuria, no dysuria MS:no joint pain, no claudication Neuro:no syncope, no lightheadedness Endo:no diabetes, no thyroid disease  Wt Readings from Last 3 Encounters:  03/19/21 187 lb (84.8 kg)  03/06/21 185 lb (83.9 kg)  01/02/21 191 lb (86.6 kg)     PHYSICAL EXAM: VS:  BP (!) 152/78    Pulse 70    Ht 6' (1.829 m)    Wt 187 lb (84.8 kg)    SpO2 99%    BMI 25.36 kg/m  , BMI Body mass index is 25.36  kg/m. General:Pleasant affect, NAD Skin:Warm and dry, brisk capillary refill HEENT:normocephalic, sclera clear, mucus membranes moist Neck:supple, no JVD, + bruits  Heart:S1S2 RRR without murmur, gallup, rub or click Lungs:clear without rales, rhonchi, or wheezes SJG:GEZM, non tender, + BS, do not palpate liver spleen or masses Ext:no lower ext edema, 2+ pedal pulses, 2+ radial pulses Neuro:alert and oriented, MAE, follows commands, + facial symmetry    EKG:  EKG is not ordered today. The ekg from 03/06/21 SB at 57 and deep T wave inversions in lateral leads.    Recent Labs: 11/03/2020: ALT 30 12/02/2020: Magnesium 1.8 03/06/2021: BUN 13; Creatinine, Ser 0.94; Hemoglobin 13.1; Platelets 176; Potassium 3.8; Sodium 139    Lipid Panel    Component Value Date/Time   CHOL 159 11/04/2020 0614   CHOL 160 06/23/2018 1445   TRIG 187 (H) 11/04/2020 0614   HDL 31 (L) 11/04/2020 0614   HDL 29 (L) 06/23/2018 1445   CHOLHDL 5.1 11/04/2020 0614   VLDL 37 11/04/2020 0614   LDLCALC 91 11/04/2020 0614   LDLCALC 77 08/15/2019 1002       Other studies Reviewed: Additional studies/ records that were reviewed today include:  TTE 02/13/21 .IMPRESSIONS     1. The mid to distal anteroseptal, anterior, anterolateral inferoseptal  walls are akinetic. The apex is akinetic. Marland Kitchen Left ventricular ejection  fraction, by estimation, is 20 to 25%. The left ventricle has severely  decreased function. The left ventricle  demonstrates regional wall motion abnormalities (see scoring  diagram/findings for description). Left ventricular diastolic parameters  are consistent with Grade II diastolic dysfunction (pseudonormalization).  Elevated left atrial pressure.   2. Right ventricular systolic function is normal. The right ventricular  size is normal. Tricuspid regurgitation signal is inadequate for assessing  PA pressure.   3. Left atrial size was mildly dilated.   4. The mitral valve is abnormal. Mild  mitral valve regurgitation. No  evidence of mitral stenosis.   5. The aortic valve is tricuspid. There is moderate calcification of the  aortic valve. There is moderate thickening of the aortic valve. Aortic  valve regurgitation is not visualized. No aortic stenosis is present.   6. The inferior vena cava is normal in size with greater than 50%  respiratory variability, suggesting right atrial pressure of 3 mmHg.   Comparison(s): LVEF 20-25%.   FINDINGS   Left Ventricle: The mid to distal anteroseptal, anterior, anterolateral  inferoseptal walls are akinetic. The apex is akinetic. Left ventricular  ejection fraction, by estimation, is 20 to 25%. The left ventricle has  severely decreased function. The left  ventricle demonstrates regional wall motion abnormalities. The left  ventricular internal cavity size was normal in size. There is no left  ventricular hypertrophy. Left ventricular diastolic parameters are  consistent with Grade II diastolic dysfunction  (pseudonormalization). Elevated left atrial pressure.   Right Ventricle: The right ventricular size is normal. No increase in  right ventricular wall thickness. Right ventricular systolic function is  normal. Tricuspid regurgitation signal is inadequate for assessing PA  pressure.   Left Atrium: Left atrial size was mildly dilated.   Right Atrium: Right atrial size was normal in size.   Pericardium: There is no evidence of pericardial effusion.   Mitral Valve: The mitral valve is abnormal. There is mild thickening of  the mitral valve leaflet(s). There is mild calcification of the mitral  valve leaflet(s). Mild mitral annular calcification. Mild mitral valve  regurgitation. No evidence of mitral  valve stenosis.   Tricuspid Valve: The tricuspid valve is normal in structure. Tricuspid  valve regurgitation is trivial. No evidence of tricuspid stenosis.   Aortic Valve: The aortic valve is tricuspid. There is moderate   calcification of the aortic valve. There is moderate thickening of the  aortic valve. There is moderate aortic valve annular calcification. Aortic  valve regurgitation is not visualized. No  aortic stenosis is present. Aortic valve mean gradient measures 5.0 mmHg.  Aortic valve peak gradient measures 7.8 mmHg. Aortic valve area, by VTI  measures 2.07 cm.   Pulmonic Valve: The pulmonic valve was not well visualized. Pulmonic valve  regurgitation is mild. No evidence of pulmonic stenosis.   Aorta: The aortic root is normal in size and structure.   Venous: The inferior vena cava is normal in size with greater than 50%  respiratory variability, suggesting right atrial pressure of 3 mmHg.   IAS/Shunts: No atrial level shunt detected by color flow Doppler.    ASSESSMENT AND PLAN:  1.  Cardiomyopathy persists post STEMI from 10/2020 that was not intervened upon due to pt having acute CVA as well.  Pt has been on GDMT except for farxica I added today and will increase entresto to higher dose.  We discussed ICD placement.  In addition while pt has not angina would be helpful to eval CAD.  Will discuss with Dr. Domenic Polite either cardiac CTA vs cardiac cath.  Will need referral to EP to discuss ICD, pt is willing to see them.  Review of recent hospitalization and labs along with echo.  2.  CAD with STEMI in 10/2020 and not intervened upon. See above  3. Hx CVA 10/2020 little residual. Had Rt ICA stenting 11/06/20.  Recent attempt for carotid stent for high grade lt sided asymptomatic stenosis unsuccessful unable to cross plaque.  For carotid dopplers 04/10/21  4. PAF .  On eliquis and BB maintainig sR.  5.  HLD  on lipitor 40 continue will check lipids in 2 weeks.  Goal LDL < 70 at least  6. Tobacco use, had stopped but has resumed. Is aware of need to stop.   7.  HTN elevated today will increase entresto and check BMP in 2 weeks.   Addendum 03/20/21 I discussed testing  with Dr. Domenic Polite and he  recommends cardiac MRI to evaluate for myocardial viability. If he is not having angina and it is found that he has regions of scar without viability, then revascularization would likely not be pursued and he could follow through with EP consultation.  I notified pt of his recommendations and pt is willing to proceed.    Current medicines are reviewed with the patient today.  The patient Has no concerns regarding medicines.  The following changes have been made:  See above Labs/ tests ordered today include:see above  Disposition:   FU:  see above  Signed, Cecilie Kicks, NP  03/19/2021 5:10 PM    [facility] [place], [address] [place], [place] Phone: [phone]; Fax: [phone]

## 2021-03-20 ENCOUNTER — Other Ambulatory Visit: Payer: Self-pay

## 2021-03-20 MED ORDER — DAPAGLIFLOZIN PROPANEDIOL 10 MG PO TABS: 10.0000 mg | ORAL_TABLET | Freq: Every day | ORAL | 3 refills | Status: DC

## 2021-03-20 NOTE — Addendum Note (Signed)
Addended by: Levonne Hubert on: 03/20/2021 03:24 PM ? ? Modules accepted: Orders ? ?

## 2021-03-25 ENCOUNTER — Telehealth: Payer: Self-pay

## 2021-03-25 DIAGNOSIS — I429 Cardiomyopathy, unspecified: Secondary | ICD-10-CM

## 2021-03-25 NOTE — Telephone Encounter (Signed)
-----   Message from [name], NP sent at 03/20/2021  4:46 PM EST ----- ?Ok another study - I told pt what Dr. Domenic Polite recommended and he has agreed to proceed.   ? ?He needs cardiac MRI with and without contrast and in comments write to eval viability in ischemic cardiomyopathy with no cardiac cath.  Thank you   ? ?

## 2021-04-04 ENCOUNTER — Other Ambulatory Visit (HOSPITAL_COMMUNITY)
Admission: RE | Admit: 2021-04-04 | Discharge: 2021-04-04 | Disposition: A | Discharge status: Home / Self Care | Payer: Medicare Other | Source: Ambulatory Visit | Attending: Cardiology | Admitting: Cardiology

## 2021-04-04 DIAGNOSIS — I429 Cardiomyopathy, unspecified: Secondary | ICD-10-CM | POA: Diagnosis present

## 2021-04-04 DIAGNOSIS — Z79899 Other long term (current) drug therapy: Secondary | ICD-10-CM | POA: Diagnosis not present

## 2021-04-04 DIAGNOSIS — E782 Mixed hyperlipidemia: Secondary | ICD-10-CM | POA: Diagnosis not present

## 2021-04-04 LAB — BASIC METABOLIC PANEL
Anion gap: 8 (ref 5–15)
BUN: 13 mg/dL (ref 8–23)
CO2: 26 mmol/L (ref 22–32)
Calcium: 9.8 mg/dL (ref 8.9–10.3)
Chloride: 107 mmol/L (ref 98–111)
Creatinine, Ser: 0.92 mg/dL (ref 0.61–1.24)
GFR, Estimated: >60 mL/min (ref >60)
Glucose, Bld: 123 mg/dL — ABNORMAL HIGH (ref 70–99)
Potassium: 3.7 mmol/L (ref 3.5–5.1)
Sodium: 141 mmol/L (ref 135–145)

## 2021-04-04 LAB — HEPATIC FUNCTION PANEL
ALT: 19 U/L (ref 0–44)
AST: 23 U/L (ref 15–41)
Albumin: 4.2 g/dL (ref 3.5–5.0)
Alkaline Phosphatase: 107 U/L (ref 38–126)
Bilirubin, Direct: 0.2 mg/dL (ref 0.0–0.2)
Indirect Bilirubin: 0.7 mg/dL (ref 0.3–0.9)
Total Bilirubin: 0.9 mg/dL (ref 0.3–1.2)
Total Protein: 7 g/dL (ref 6.5–8.1)

## 2021-04-04 LAB — LIPID PANEL
Cholesterol: 108 mg/dL (ref 0–200)
HDL: 39 mg/dL — ABNORMAL LOW (ref >40)
LDL Cholesterol: 59 mg/dL (ref 0–99)
Total CHOL/HDL Ratio: 2.8 RATIO
Triglycerides: 52 mg/dL (ref <150)
VLDL: 10 mg/dL (ref 0–40)

## 2021-04-04 LAB — HEMOGLOBIN AND HEMATOCRIT, BLOOD
HCT: 40.7 % (ref 39.0–52.0)
Hemoglobin: 13 g/dL (ref 13.0–17.0)

## 2021-04-10 ENCOUNTER — Other Ambulatory Visit: Payer: Self-pay

## 2021-04-10 ENCOUNTER — Ambulatory Visit (HOSPITAL_COMMUNITY)
Admission: RE | Admit: 2021-04-10 | Discharge: 2021-04-10 | Disposition: A | Discharge status: Home / Self Care | Payer: Medicare Other | Source: Ambulatory Visit | Attending: Interventional Radiology | Admitting: Interventional Radiology

## 2021-04-10 DIAGNOSIS — I63411 Cerebral infarction due to embolism of right middle cerebral artery: Secondary | ICD-10-CM | POA: Diagnosis not present

## 2021-04-10 DIAGNOSIS — I6522 Occlusion and stenosis of left carotid artery: Secondary | ICD-10-CM | POA: Diagnosis not present

## 2021-04-15 ENCOUNTER — Other Ambulatory Visit: Payer: Self-pay

## 2021-04-15 ENCOUNTER — Ambulatory Visit
Admission: RE | Admit: 2021-04-15 | Discharge: 2021-04-15 | Disposition: A | Discharge status: Home / Self Care | Payer: Medicare Other | Source: Ambulatory Visit | Attending: Interventional Radiology | Admitting: Interventional Radiology

## 2021-04-15 ENCOUNTER — Other Ambulatory Visit: Payer: Self-pay | Admitting: *Deleted

## 2021-04-15 DIAGNOSIS — I6522 Occlusion and stenosis of left carotid artery: Secondary | ICD-10-CM | POA: Diagnosis not present

## 2021-04-15 DIAGNOSIS — I63411 Cerebral infarction due to embolism of right middle cerebral artery: Secondary | ICD-10-CM

## 2021-04-15 HISTORY — PX: IR RADIOLOGIST EVAL & MGMT: IMG5224

## 2021-04-15 NOTE — Procedures (Signed)
? ? ?Chief Complaint: ?Prior acute ELVO ?Left ICA high grade stenosis ?  ?Referring Physician(s): ?   ?Neurology: Dr. Antony Contras ?PCP: Dr. Stoney Bang ?Cardiology: Dr. Domenic Polite ?  ?History of Present Illness: ?Christopher Lang is a right-handed, 66 y.o. male presenting as a scheduled post-operative follow up to [place] clinic, SP cervical/cerebral angiogram 03/06/21.  ? ?Christopher Lang joins [place] today by way of virtual visit.  We confirmed his identity with 2 personal identifiers.  ? ?Hx: ?Christopher Lang was previously treated at [facility] for Code Stroke 11/03/2020, with mechanical thrombectomy of right MCA, and then interval right ICA stenting.  He was was also treated medically at the same time for STEMI, with deferral of cardiac cath for the thrombectomy performed of the right ICA/right MCA.   ?  ?Presenting NIHSS: 15, mRS: 0.  ?  ?Mechanical thrombectomy was performed 11/03/20, then with interval right ICA stenting for symptomatic high grade stenosis.   ?  ?Discharge was 11/08/2020 ? ?ECHO 11/03/2020: EF: 20 - 25%, with severely decreased function, and wall motion abnormality suggesting LAD infarction.  Impression: Ischemic cardiomyopathy ? ?EKG: 11/03/20: STEMI Anterior-septal  ? ?Interval Hx: ?Christopher Lang underwent angiogram and attempted left ICA stenting 03/06/21.   We were unable to cross the complex calcified stenosis, and thus stenting was not performed.  He was discharged the following day 2/17.  ?  ?Today he tells me that he is doing fine, with no new symptoms.  He has some residual ongoing weakness of the left lower extremity, from his prior stroke.   He tells me he has healed fine from the angiogram, with no concerns.  ?  ?We repeated a duplex 04/10/2021, showing patent right ICA stent, and a described stenosis of the left ICA based on SRU criteria of 50-69% stenosis.  This is clearly under-representing the stenosis, given the degree of calcium, as this is clearly high-grade on prior CTA and angiogram.  ?  ?He continues  maximal medical therapy:  including anti-lipids, plavix, HTN medication, and is prescribed eliquis for A-fib.  ?  ?He has been successful with smoking cessation.  ? ?Past Medical History:  ?Diagnosis Date  ? Acute CVA (cerebrovascular accident) ([place]) 11/03/2020  ? Acute left-sided weakness 11/03/2020  ? Acute ST elevation myocardial infarction (STEMI) ([place]) 11/03/2020  ? Carotid artery disease ([place])   ? Cataract   ? Removed Dec 2017/Jan 2018  ? CHF (congestive heart failure) ([place])   ? COPD (chronic obstructive pulmonary disease) ([place])   ? Coronary artery disease   ? Dyspnea   ? Essential hypertension   ? H. pylori infection 11/2016  ? With documented eradication via biopsy Feb 2019  ? Heart murmur   ? History of atrial fibrillation   ? November 2017  ? Hyperlipidemia   ? Peripheral vascular disease ([place])   ? Sleep apnea   ? Did not tolerate CPAP  ? Tubulovillous adenoma of rectum 04/2010  ? ? ?Past Surgical History:  ?Procedure Laterality Date  ? CATARACT EXTRACTION W/PHACO Right 01/16/2016  ? Procedure: CATARACT EXTRACTION PHACO AND INTRAOCULAR LENS PLACEMENT RIGHT EYE CDE= 6.97;  Surgeon: Tonny Branch, MD;  [facility];  Service: Ophthalmology;  Laterality: Right;  right  ? CATARACT EXTRACTION W/PHACO Left 01/30/2016  ? Procedure: CATARACT EXTRACTION PHACO AND INTRAOCULAR LENS PLACEMENT LEFT EYE CDE 3.13;  Surgeon: Tonny Branch, MD;  Location: [facility];  Service: Ophthalmology;  Laterality: Left;  left  ? COLONOSCOPY  2012  ? COLONOSCOPY N/A 12/04/2016  ?  three 4-6 mm tubular adenomas, moderate size external and internal hemorrhoids, redundant left colon, 3 year surveillance  ? ESOPHAGOGASTRODUODENOSCOPY N/A 12/04/2016  ? Dr. Oneida Alar: normal esophagus, small hiatal hernia, chronic gastritis with H.pylori, peptic duodenitis, atypical lymphoid proliferation. Surveillance EGD Feb 2019 without abnormal cells, negative H.pylori  ? ESOPHAGOGASTRODUODENOSCOPY N/A 03/15/2017  ? mild gastritis, no atypical cells, negative  H.pylori  ? EXCISION, SKI  08/2016  ? R THIGH  ? IR ANGIO INTRA EXTRACRAN SEL COM CAROTID INNOMINATE BILAT MOD SED  11/06/2020  ? IR ANGIO INTRA EXTRACRAN SEL COM CAROTID INNOMINATE BILAT MOD SED  03/06/2021  ? IR CT HEAD LTD  11/03/2020  ? IR INTRAVSC STENT CERV CAROTID W/EMB-PROT MOD SED INCL ANGIO  11/06/2020  ? IR PERCUTANEOUS ART THROMBECTOMY/INFUSION INTRACRANIAL INC DIAG ANGIO  11/03/2020  ? IR PTA INTRACRANIAL  11/04/2020  ? IR RADIOLOGIST EVAL & MGMT  01/23/2021  ? IR US GUIDE VASC ACCESS LEFT  11/03/2020  ? IR US GUIDE VASC ACCESS RIGHT  11/03/2020  ? IR US GUIDE VASC ACCESS RIGHT  11/06/2020  ? IR US GUIDE VASC ACCESS RIGHT  03/06/2021  ? RADIOLOGY WITH ANESTHESIA N/A 11/03/2020  ? Procedure: RADIOLOGY WITH ANESTHESIA;  Surgeon: Radiologist, Medication, MD;  Location: [place];  Service: Radiology;  Laterality: N/A;  ? RADIOLOGY WITH ANESTHESIA N/A 11/06/2020  ? Procedure: STENTING;  Surgeon: Corrie Mckusick, DO;  Location: [place];  Service: Anesthesiology;  Laterality: N/A;  ? RADIOLOGY WITH ANESTHESIA Left 03/06/2021  ? Procedure: CAROTID STENT;  Surgeon: Corrie Mckusick, DO;  Location: [place];  Service: Anesthesiology;  Laterality: Left;  ? ? ?Allergies: ?Patient has no known allergies. ? ?Medications: ?Prior to Admission medications   ?Medication Sig Start Date End Date Taking? Authorizing Provider  ?apixaban (ELIQUIS) 5 MG TABS tablet Take 1 tablet (5 mg total) by mouth 2 (two) times daily. 03/19/21   [name], NP  ?atorvastatin (LIPITOR) 40 MG tablet Take 1 tablet (40 mg total) by mouth daily at 6 PM. 11/08/20   [name], PA-C  ?clopidogrel (PLAVIX) 75 MG tablet Take 1 tablet (75 mg total) by mouth daily. 11/09/20   [name], PA-C  ?dapagliflozin propanediol (FARXIGA) 10 MG TABS tablet Take 1 tablet (10 mg total) by mouth daily before breakfast. 03/20/21   Satira Sark, MD  ?Melatonin 10 MG CAPS Take 10 mg by mouth at bedtime as needed (sleep). ?Patient not taking: Reported on 03/19/2021     [provider]  ?metoprolol succinate (TOPROL XL) 25 MG 24 hr tablet Take 1 tablet (25 mg total) by mouth daily. 02/19/21   Strader, Fransisco Hertz, PA-C  ?sacubitril-valsartan (ENTRESTO) 97-103 MG Take 1 tablet by mouth 2 (two) times daily. 03/19/21   [name], NP  ?spironolactone (ALDACTONE) 25 MG tablet Take 25 mg by mouth daily.    [provider]  ?varenicline (CHANTIX) 0.5 MG tablet Take 1 tablet (0.5 mg total) by mouth 2 (two) times daily. 11/08/20   [name], PA-C  ?  ? ?Family History  ?Problem Relation Age of Onset  ? CAD Mother   ? High blood pressure Mother   ? Heart attack Mother   ? Leukemia Father   ? Early death Father   ? Heart attack Sister   ? Diabetes Sister   ? Cancer Brother   ? Deep vein thrombosis Brother   ? Hypertension Daughter   ? Stroke Sister   ? Stroke Sister   ? Heart  attack Sister   ? Deep vein thrombosis Sister   ? Hypertension Sister   ? Diabetes Sister   ? Arthritis Sister   ? Arthritis Sister   ? Arthritis Sister   ? Hypertension Sister   ? Heart attack Sister   ? Cancer Brother   ?     KIDNEY  ? Kidney disease Brother   ? Deep vein thrombosis Brother   ? Seizures Brother   ? Heart attack Brother   ? Colon cancer Neg Hx   ? Colon polyps Neg Hx   ? ? ?Social History  ? ?Socioeconomic History  ? Marital status: Married  ?  Spouse name: [name]  ? Number of children: 2  ? Years of education: GED  ? Highest education level: Not on file  ?Occupational History  ? Occupation: disability  ?  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  ?Tobacco Use  ? Smoking status: Former  ?  Packs/day: 1.00  ?  Years: 45.00  ?  Pack years: 45.00  ?  Types: Cigarettes  ?  Quit date: 11/03/2020  ?  Years since quitting: 0.4  ? Smokeless tobacco: Never  ? Tobacco comments:  ?  started back - pack per day  ?Vaping Use  ? Vaping Use: Never used  ?Substance and Sexual Activity  ? Alcohol use: Not Currently  ?  Comment: none since 11/03/20  ? Drug use: No  ? Sexual activity: Yes  ?Other Topics  Concern  ? Not on file  ?Social History Narrative  ? Lives with wife [name]  ? Leisure - couch potato  ? ?Social Determinants of Health  ? ?Financial Resource Strain: Low Risk   ? Difficulty of Paying Livin

## 2021-04-15 NOTE — Patient Outreach (Signed)
?[facility]) Care Management ?Telephonic RN Care Manager Note ? ? ?04/15/2021 ?Name:  Christopher Lang MRN:  [id number] DOB:  02/22/1955 ? ?Summary: ?Outgoing call placed to member, successful.  Denies any urgent concerns, encouraged to contact this care manager with questions.   ? ?Recommendations/Changes made from today's visit: ?Purchase new blood pressure monitor and restart monitoring/recording daily blood pressure and heart rate. ? ?Subjective: ?Christopher Lang is an 66 y.o. year old male who is a primary patient of Hasanaj, Samul Dada, MD. The care management team was consulted for assistance with care management and/or care coordination needs.   ? ?Telephonic RN Care Manager completed Telephone Visit today. ? ?Objective:  ? ?Medications Reviewed Today   ? ? Reviewed by [name], NP (Nurse Practitioner) on 03/19/21 at 1710  Med List Status: <None>  ? ?Medication Order Taking? Sig Documenting Provider Last Dose Status Informant  ?apixaban (ELIQUIS) 5 MG TABS tablet [id number]  Take 1 tablet (5 mg total) by mouth 2 (two) times daily. [name], NP  Active   ?atorvastatin (LIPITOR) 40 MG tablet [id number] Yes Take 1 tablet (40 mg total) by mouth daily at 6 PM. [name], PA-C Taking Active Self  ?clopidogrel (PLAVIX) 75 MG tablet [id number] Yes Take 1 tablet (75 mg total) by mouth daily. [name], PA-C Taking Active Self  ?dapagliflozin propanediol (FARXIGA) 10 MG TABS tablet [id number] Yes Take 1 tablet (10 mg total) by mouth daily before breakfast. [name], NP  Active   ?Melatonin 10 MG CAPS [id number] No Take 10 mg by mouth at bedtime as needed (sleep).  ?Patient not taking: Reported on 03/19/2021  ? [provider] Not Taking Active Self  ?         ?Med Note Jilda Roche A   Mon Jan 06, 2021 11:59 AM) Hasnt started  ?metoprolol succinate (TOPROL XL) 25 MG 24 hr tablet [id number] Yes Take 1 tablet (25 mg total) by mouth daily. Erma Heritage, PA-C  Taking Active Self  ?sacubitril-valsartan (ENTRESTO) 97-103 MG [id number] Yes Take 1 tablet by mouth 2 (two) times daily. [name], NP  Active   ?spironolactone (ALDACTONE) 25 MG tablet [id number] Yes Take 25 mg by mouth daily. [provider] Taking Active   ?varenicline (CHANTIX) 0.5 MG tablet [id number] Yes Take 1 tablet (0.5 mg total) by mouth 2 (two) times daily. [name], PA-C Taking Active Self  ? ?  ?  ? ?  ? ? ? ?SDOH:  (Social Determinants of Health) assessments and interventions performed:  ? ? ? ?Care Plan ? ?Review of patient past medical history, allergies, medications, health status, including review of consultants reports, laboratory and other test data, was performed as part of comprehensive evaluation for care management services.  ? ?Care Plan : [facility] Plan of Care (Adult)  ?Updates made by [name], RN since 04/15/2021 12:00 AM  ?  ? ?Problem: Knowledge deficit related to management of chronic conditions (HTN, CHF, and A-fib) evidenced by recent stroke   ?Priority: High  ?  ? ?Long-Range Goal: Management of chronic conditions (A-fib, CHF, HTN) and recovery from stroke evidenced by no readmissions   ?Start Date: 11/14/2020  ?Expected End Date: 05/13/2021  ?This Visit's Progress: On track  ?Recent Progress: On track  ?Priority: High  ?Note:   ?Current Barriers:  ?Knowledge Deficits related to plan of care for management of Atrial Fibrillation, CHF, HTN, and Stroke ?Chronic Disease Management support and education  needs related to Atrial Fibrillation, CHF, HTN, and Stroke ? ?RNCM Clinical Goal(s):  ?Patient will verbalize understanding of plan for management of Atrial Fibrillation, CHF, HTN, and stroke as evidenced by verbalization of plan of care for adequate management ?take all medications exactly as prescribed and will call provider for medication related questions as evidenced by member reported adherence ?attend all scheduled medical appointments: Neurology, cardiology &  vascular as evidenced by Neurology, cardiology & vascular ?not experience hospital admission as evidenced by review of EMR. Hospital Admissions in last 6 months = 2  through collaboration with RN Care manager, provider, and care team.  ? ?Interventions: ?Inter-disciplinary care team collaboration (see longitudinal plan of care) ?Evaluation of current treatment plan related to  self management and patient's adherence to plan as established by provider ? ? ?Hypertension Interventions: ?Last practice recorded BP readings:  ?BP Readings from Last 3 Encounters:  ?03/07/21 (!) 114/54  ?01/23/21 (!) 164/87  ?01/02/21 (!) 178/81  ?Most recent eGFR/CrCl: No results found for: EGFR  No components found for: CRCL ? ?Evaluation of current treatment plan related to hypertension self management and patient's adherence to plan as established by provider; ?Provided education to patient re: stroke prevention, s/s of heart attack and stroke; ?Reviewed medications with patient and discussed importance of compliance; ?Advised patient, providing education and rationale, to monitor blood pressure daily and record, calling PCP for findings outside established parameters;  ? ?Heart Failure Interventions: ?Provided education on low sodium diet; ?Reviewed Heart Failure Action Plan in depth and provided written copy; ?Assessed need for readable accurate scales in home; ?Provided education about placing scale on hard, flat surface; ?Discussed importance of daily weight and advised patient to weigh and record daily; ?Reviewed role of diuretics in prevention of fluid overload and management of heart failure; ?Discussed the importance of keeping all appointments with provider; ? ?Update 10/27 - Report weight today is 188.1 pounds, wife state this has been stable. ? ?AFIB Interventions:  ?  Counseled on increased risk of stroke due to Afib and benefits of anticoagulation for stroke prevention; ?Reviewed importance of adherence to anticoagulant  exactly as prescribed; ?Counseled on bleeding risk associated with Eliquis and Plavis and importance of self-monitoring for signs/symptoms of bleeding; ? ? ?Update 11/15 - Member report speech therapist is currently in the home to work with him.  Has follow up appointment with neurology on 12/15 and will start cardiac rehab on 12/21.  Was seen by cardiology on 11/3, BP, HR, and weights were all stable.  No reported issues noted, no medication changes.  Will follow up with cardiology in 6 months.  ? ?Patient Goals/Self-Care Activities: ?Patient will self administer medications as prescribed ?Patient will attend all scheduled provider appointments ?Patient will continue to perform ADL's independently ?Patient will continue to perform IADL's independently ? ?Follow Up Plan:  Telephone follow up appointment with care management team member scheduled for:  05/09/2021 ?The patient has been provided with contact information for the care management team and has been advised to call with any health related questions or concerns.  ? ? ?Update 12/13 - Report he has continued to improve well with therapy at home, speech is much clearer today than with initial assessment.  He has follow up with neuro this Thursday and will have orientation for cardiac rehab next week.  Once orientation is complete, he will have his rehab schedule.  State that he is tolerating his increased dose of Entresto without problems (dose changed on 11/18).  He continues to  monitor blood pressure, HR, and weight daily, unable to get to notebook for readings today but state they have been normal.  Does report weight was 185 pounds this morning, decreased from last outreach.  Report concern for problems with sleeping, inquires if any medications could cause this.  He is taking Chantix, side effect of insomnia.  He will discuss with provider during office visit this week.  State he also has follow up with PCP on 1/3. ? ? ?Update 1/10 - Member report doing  well, was seen by neurology, PCP, and IR since last outreach.  PCP increased Spironolactone to a full tablet daily instead of half due to elevated blood pressures, state it has not started to decrease.  Last rea

## 2021-04-16 ENCOUNTER — Other Ambulatory Visit: Payer: Self-pay

## 2021-04-16 ENCOUNTER — Ambulatory Visit: Payer: Medicare Other | Admitting: Student

## 2021-04-16 ENCOUNTER — Other Ambulatory Visit: Payer: Self-pay | Admitting: Vascular Surgery

## 2021-04-16 ENCOUNTER — Encounter: Payer: Self-pay | Admitting: Student

## 2021-04-16 VITALS — BP 126/74 | HR 66 | Ht 72.0 in | Wt 187.0 lb

## 2021-04-16 DIAGNOSIS — I480 Paroxysmal atrial fibrillation: Secondary | ICD-10-CM | POA: Diagnosis not present

## 2021-04-16 DIAGNOSIS — I6522 Occlusion and stenosis of left carotid artery: Secondary | ICD-10-CM

## 2021-04-16 DIAGNOSIS — I6523 Occlusion and stenosis of bilateral carotid arteries: Secondary | ICD-10-CM | POA: Diagnosis not present

## 2021-04-16 DIAGNOSIS — E785 Hyperlipidemia, unspecified: Secondary | ICD-10-CM

## 2021-04-16 DIAGNOSIS — I63411 Cerebral infarction due to embolism of right middle cerebral artery: Secondary | ICD-10-CM

## 2021-04-16 DIAGNOSIS — I5020 Unspecified systolic (congestive) heart failure: Secondary | ICD-10-CM | POA: Diagnosis not present

## 2021-04-16 DIAGNOSIS — I2510 Atherosclerotic heart disease of native coronary artery without angina pectoris: Secondary | ICD-10-CM | POA: Diagnosis not present

## 2021-04-16 NOTE — Progress Notes (Signed)
? ?Cardiology Office Note   ? ?Date:  04/16/2021  ? ?ID:  Christopher Lang, DOB 06/29/1955, MRN [id number] ? ?PCP:  Neale Burly, MD  ?Cardiologist: Rozann Lesches, MD   ? ?Chief Complaint  ?Patient presents with  ? Follow-up  ?  1 month visit  ? ? ?History of Present Illness:   ? ?Christopher Lang is a 66 y.o. male with past medical history of HFrEF (EF 20-25% by echo in 10/2020, similar results by repeat echo in 01/2021), CAD (s/p anterior STEMI in 10/2020 but not a candidate for invasive cath at that time given his acute CVA and having received TNK), paroxysmal atrial fibrillation, carotid artery stenosis (s/p RICA stent in 10/2020 and unsuccessful intervention on LICA in 11/9415), HTN, HLD and prior CVA who presents to the office today for 1 month follow-up. ? ?He was last examined by Cecilie Kicks, NP on 03/19/2021 and reported overall doing well at that time and denied any recent anginal symptoms. Recent echocardiogram had shown that his EF remained reduced at 20 to 25% with regional wall motion abnormalities. He was continued on Atorvastatin, Plavix, Eliquis, Spironolactone, Entresto and Toprol-XL with Wilder Glade being added to his medication regimen and Entresto was titrated to 97-103 mg twice daily. She did review his case with Dr. Domenic Polite who recommended a cardiac MRI for further evaluation of myocardial viability and if he was found to have regions of scar without viability, then revascularization would likely not be pursued and could follow-up with EP for consideration of ICD placement. His cMRI is scheduled for 04/25/2021. ? ?In talking with the patient and his wife today, he reports having baseline dyspnea on exertion over the past few months but denies any associated chest pain or palpitations. Has been walking around the neighborhood for exercise. No recent orthopnea, PND or lower extremity edema. He reports good compliance with his current medication regimen and denies any noted side effects to recent  dose adjustment of Entresto or Farxiga.  He does try to limit sodium intake and his weight has been stable on his home scales. ? ? ?Past Medical History:  ?Diagnosis Date  ? Acute CVA (cerebrovascular accident) ([place]) 11/03/2020  ? Acute left-sided weakness 11/03/2020  ? Acute ST elevation myocardial infarction (STEMI) ([place]) 11/03/2020  ? a. s/p anterior STEMI in 10/2020 but not a candidate for invasive cath at that time given his acute CVA and having received TNK  ? Carotid artery disease ([place])   ? Cataract   ? Removed Dec 2017/Jan 2018  ? CHF (congestive heart failure) ([place])   ? a. EF 20-25% by echo in 10/2020, similar results by repeat echo in 01/2021  ? COPD (chronic obstructive pulmonary disease) ([place])   ? Coronary artery disease   ? Dyspnea   ? Essential hypertension   ? H. pylori infection 11/2016  ? With documented eradication via biopsy Feb 2019  ? Heart murmur   ? History of atrial fibrillation   ? November 2017  ? Hyperlipidemia   ? Peripheral vascular disease ([place])   ? Sleep apnea   ? Did not tolerate CPAP  ? Tubulovillous adenoma of rectum 04/2010  ? ? ?Past Surgical History:  ?Procedure Laterality Date  ? CATARACT EXTRACTION W/PHACO Right 01/16/2016  ? Procedure: CATARACT EXTRACTION PHACO AND INTRAOCULAR LENS PLACEMENT RIGHT EYE CDE= 6.97;  Surgeon: Tonny Branch, MD;  Location: [facility];  Service: Ophthalmology;  Laterality: Right;  right  ? CATARACT EXTRACTION W/PHACO Left 01/30/2016  ? Procedure: CATARACT  EXTRACTION PHACO AND INTRAOCULAR LENS PLACEMENT LEFT EYE CDE 3.13;  Surgeon: Tonny Branch, MD;  Location: [facility];  Service: Ophthalmology;  Laterality: Left;  left  ? COLONOSCOPY  2012  ? COLONOSCOPY N/A 12/04/2016  ? three 4-6 mm tubular adenomas, moderate size external and internal hemorrhoids, redundant left colon, 3 year surveillance  ? ESOPHAGOGASTRODUODENOSCOPY N/A 12/04/2016  ? Dr. Oneida Alar: normal esophagus, small hiatal hernia, chronic gastritis with H.pylori, peptic duodenitis, atypical lymphoid  proliferation. Surveillance EGD Feb 2019 without abnormal cells, negative H.pylori  ? ESOPHAGOGASTRODUODENOSCOPY N/A 03/15/2017  ? mild gastritis, no atypical cells, negative H.pylori  ? EXCISION, SKI  08/2016  ? R THIGH  ? IR ANGIO INTRA EXTRACRAN SEL COM CAROTID INNOMINATE BILAT MOD SED  11/06/2020  ? IR ANGIO INTRA EXTRACRAN SEL COM CAROTID INNOMINATE BILAT MOD SED  03/06/2021  ? IR CT HEAD LTD  11/03/2020  ? IR INTRAVSC STENT CERV CAROTID W/EMB-PROT MOD SED INCL ANGIO  11/06/2020  ? IR PERCUTANEOUS ART THROMBECTOMY/INFUSION INTRACRANIAL INC DIAG ANGIO  11/03/2020  ? IR PTA INTRACRANIAL  11/04/2020  ? IR RADIOLOGIST EVAL & MGMT  01/23/2021  ? IR RADIOLOGIST EVAL & MGMT  04/15/2021  ? IR US GUIDE VASC ACCESS LEFT  11/03/2020  ? IR US GUIDE VASC ACCESS RIGHT  11/03/2020  ? IR US GUIDE VASC ACCESS RIGHT  11/06/2020  ? IR US GUIDE VASC ACCESS RIGHT  03/06/2021  ? RADIOLOGY WITH ANESTHESIA N/A 11/03/2020  ? Procedure: RADIOLOGY WITH ANESTHESIA;  Surgeon: Radiologist, Medication, MD;  Location: [place];  Service: Radiology;  Laterality: N/A;  ? RADIOLOGY WITH ANESTHESIA N/A 11/06/2020  ? Procedure: STENTING;  Surgeon: Corrie Mckusick, DO;  Location: [place];  Service: Anesthesiology;  Laterality: N/A;  ? RADIOLOGY WITH ANESTHESIA Left 03/06/2021  ? Procedure: CAROTID STENT;  Surgeon: Corrie Mckusick, DO;  Location: [place];  Service: Anesthesiology;  Laterality: Left;  ? ? ?Current Medications: ?Outpatient Medications Prior to Visit  ?Medication Sig Dispense Refill  ? apixaban (ELIQUIS) 5 MG TABS tablet Take 1 tablet (5 mg total) by mouth 2 (two) times daily. 180 tablet 3  ? atorvastatin (LIPITOR) 40 MG tablet Take 1 tablet (40 mg total) by mouth daily at 6 PM. 60 tablet 0  ? clopidogrel (PLAVIX) 75 MG tablet Take 1 tablet (75 mg total) by mouth daily. 60 tablet 0  ? dapagliflozin propanediol (FARXIGA) 10 MG TABS tablet Take 1 tablet (10 mg total) by mouth daily before breakfast. 90 tablet 3  ? metoprolol succinate (TOPROL XL) 25 MG 24  hr tablet Take 1 tablet (25 mg total) by mouth daily. 90 tablet 1  ? sacubitril-valsartan (ENTRESTO) 97-103 MG Take 1 tablet by mouth 2 (two) times daily. 60 tablet 11  ? spironolactone (ALDACTONE) 25 MG tablet Take 25 mg by mouth daily.    ? varenicline (CHANTIX) 0.5 MG tablet Take 1 tablet (0.5 mg total) by mouth 2 (two) times daily. 240 tablet 0  ? Melatonin 10 MG CAPS Take 10 mg by mouth at bedtime as needed (sleep). (Patient not taking: Reported on 04/16/2021)    ? ?No facility-administered medications prior to visit.  ?  ? ?Allergies:   Patient has no known allergies.  ? ?Social History  ? ?Socioeconomic History  ? Marital status: Married  ?  Spouse name: [name]  ? Number of children: 2  ? Years of education: GED  ? Highest education level: Not on file  ?Occupational History  ? Occupation: disability  ?  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 Smoking status: Former  ?  Packs/day: 1.00  ?  Years: 45.00  ?  Pack years: 45.00  ?  Types: Cigarettes  ?  Quit date: 11/03/2020  ?  Years since quitting: 0.4  ? Smokeless tobacco: Never  ? Tobacco comments:  ?  started back - pack per day  ?Vaping Use  ? Vaping Use: Never used  ?Substance and Sexual Activity  ? Alcohol use: Not Currently  ?  Comment: none since 11/03/20  ? Drug use: No  ? Sexual activity: Yes  ?Other Topics Concern  ? Not on file  ?Social History Narrative  ? Lives with wife [name]  ? Leisure - couch potato  ? ?Social Determinants of Health  ? ?Financial Resource Strain: Low Risk   ? Difficulty of Paying Living Expenses: Not hard at all  ?Food Insecurity: No Food Insecurity  ? Worried About Charity fundraiser in the Last Year: Never true  ? Ran Out of Food in the Last Year: Never true  ?Transportation Needs: No Transportation Needs  ? Lack of Transportation (Medical): No  ? Lack of Transportation (Non-Medical): No  ?Physical Activity: Inactive  ? Days of Exercise per Week: 0 days  ? Minutes of Exercise per Session: 0 min  ?Stress: No Stress  Concern Present  ? Feeling of Stress : Only a little  ?Social Connections: Not on file  ?  ? ?Family History:  The patient's family history includes Arthritis in his sister, sister, and sister; CAD in his 15

## 2021-04-16 NOTE — Patient Instructions (Signed)
Medication Instructions:  ?Your physician recommends that you continue on your current medications as directed. Please refer to the Current Medication list given to you today. ? ?*If you need a refill on your cardiac medications before your next appointment, please call your pharmacy* ? ? ?Lab Work: ?NONE  ? ?If you have labs (blood work) drawn today and your tests are completely normal, you will receive your results only by: ?MyChart Message (if you have MyChart) OR ?A paper copy in the mail ?If you have any lab test that is abnormal or we need to change your treatment, we will call you to review the results. ? ? ?Testing/Procedures: ?NONE  ? ? ?Follow-Up: ?At [facility], you and your health needs are our priority.  As part of our continuing mission to provide you with exceptional heart care, we have created designated Provider Care Teams.  These Care Teams include your primary Cardiologist (physician) and Advanced Practice Providers (APPs -  Physician Assistants and Nurse Practitioners) who all work together to provide you with the care you need, when you need it. ? ?We recommend signing up for the patient portal called "MyChart".  Sign up information is provided on this After Visit Summary.  MyChart is used to connect with patients for Virtual Visits (Telemedicine).  Patients are able to view lab/test results, encounter notes, upcoming appointments, etc.  Non-urgent messages can be sent to your provider as well.   ?To learn more about what you can do with MyChart, go to [email or web address].   ? ?Your next appointment:   ? As Scheduled  ? ?The format for your next appointment:   ?In Person ? ?Provider:   ?Rozann Lesches, MD  ? ? ?Other Instructions ?Thank you for choosing [facility]! ?  ? ?

## 2021-04-22 DIAGNOSIS — I5022 Chronic systolic (congestive) heart failure: Secondary | ICD-10-CM | POA: Diagnosis not present

## 2021-04-22 DIAGNOSIS — Z0000 Encounter for general adult medical examination without abnormal findings: Secondary | ICD-10-CM | POA: Diagnosis not present

## 2021-04-22 DIAGNOSIS — E782 Mixed hyperlipidemia: Secondary | ICD-10-CM | POA: Diagnosis not present

## 2021-04-22 DIAGNOSIS — I10 Essential (primary) hypertension: Secondary | ICD-10-CM | POA: Diagnosis not present

## 2021-04-22 DIAGNOSIS — I480 Paroxysmal atrial fibrillation: Secondary | ICD-10-CM | POA: Diagnosis not present

## 2021-04-22 DIAGNOSIS — G4739 Other sleep apnea: Secondary | ICD-10-CM | POA: Diagnosis not present

## 2021-04-24 ENCOUNTER — Telehealth (HOSPITAL_COMMUNITY): Payer: Self-pay | Admitting: Emergency Medicine

## 2021-04-24 NOTE — Telephone Encounter (Signed)
Attempted to call patient regarding upcoming cardiac MR appointment. Left message on voicemail with name and callback number Daryel Kenneth RN Navigator Cardiac Imaging [facility] [phone] Office [phone] Cell  

## 2021-04-25 ENCOUNTER — Ambulatory Visit (HOSPITAL_COMMUNITY)
Admission: RE | Admit: 2021-04-25 | Discharge: 2021-04-25 | Disposition: A | Discharge status: Home / Self Care | Payer: Medicare Other | Source: Ambulatory Visit | Attending: Cardiology | Admitting: Cardiology

## 2021-04-25 DIAGNOSIS — I429 Cardiomyopathy, unspecified: Secondary | ICD-10-CM | POA: Insufficient documentation

## 2021-04-25 MED ORDER — GADOBUTROL 1 MMOL/ML IV SOLN
8.5000 mL | Freq: Once | INTRAVENOUS | Status: AC | PRN
  Administered 2021-04-25: 8.5 mL via INTRAVENOUS

## 2021-04-30 ENCOUNTER — Telehealth: Payer: Self-pay | Admitting: *Deleted

## 2021-04-30 DIAGNOSIS — R918 Other nonspecific abnormal finding of lung field: Secondary | ICD-10-CM

## 2021-04-30 DIAGNOSIS — I5020 Unspecified systolic (congestive) heart failure: Secondary | ICD-10-CM

## 2021-04-30 NOTE — Telephone Encounter (Signed)
-----   Message from [name], NP sent at 04/29/2021  1:41 PM EDT ----- ?Please let pt know that MRI with hx of myocardial infarction (heart attack) no areas that would improve with cardiac cath and intervention.  So please make appt with EP to discuss devices to help.  [place] discussed on last visit.    ? ?Also on MRI there was an area that seemed to be fatty tissue noted as mass on report but want to make sure it is not anything else.  Please arrange chest CTA with contrast to eval mass in left lower lobe.   ? ?Thanks ? ?Cecilie Kicks, FNP-C ?  ? ?

## 2021-04-30 NOTE — Telephone Encounter (Signed)
Pt notified and orders placed  

## 2021-05-07 ENCOUNTER — Ambulatory Visit: Payer: Medicare Other | Admitting: Vascular Surgery

## 2021-05-07 ENCOUNTER — Encounter: Payer: Self-pay | Admitting: Vascular Surgery

## 2021-05-07 VITALS — BP 165/82 | HR 59 | Temp 98.1°F | Resp 20 | Ht 72.0 in | Wt 186.0 lb

## 2021-05-07 DIAGNOSIS — I6522 Occlusion and stenosis of left carotid artery: Secondary | ICD-10-CM | POA: Diagnosis not present

## 2021-05-07 NOTE — Progress Notes (Signed)
? ?Patient ID: Christopher Lang, male   DOB: February 15, 1955, 66 y.o.   MRN: [id number] ? ?Reason for Consult: No chief complaint on file. ?  ?Referred by Neale Burly, MD ? ?Subjective:  ?   ?HPI: ? ?Christopher Lang is a 66 y.o. male new patient to our office.  He has significant history of hypertension and atrial fibrillation as well as COPD and tobacco use.  He was found to have severe bilateral ICA stenosis and left subclavian stenosis as well.  He has undergone mechanical thrombectomy and PTA of the high-grade symptomatic stenosis of the right ICA bifurcation back in October and was also noted to have an 89% right ICA restenosis on follow-up carotid ultrasound and he underwent angioplasty and stent placement also in October.  He is also noted to have heavily calcified left ICA stenosis.  He has now undergone attempted left ICA stenting back in February but with multiple areas of stenosis and heavily calcified plaque this was aborted.  He now follows up for evaluation of left carotid endarterectomy.  He is alone on evaluation today.  He states that he has not had any issues since his most recent procedures.  He continues on Eliquis for atrial fibrillation he is also on Plavix and he takes a statin as well.  He states that since his stroke he has quit smoking.  He denies any residual deficits. ? ?Past Medical History:  ?Diagnosis Date  ? Acute CVA (cerebrovascular accident) ([place]) 11/03/2020  ? Acute left-sided weakness 11/03/2020  ? Acute ST elevation myocardial infarction (STEMI) ([place]) 11/03/2020  ? a. s/p anterior STEMI in 10/2020 but not a candidate for invasive cath at that time given his acute CVA and having received TNK  ? Carotid artery disease ([place])   ? Cataract   ? Removed Dec 2017/Jan 2018  ? CHF (congestive heart failure) ([place])   ? a. EF 20-25% by echo in 10/2020, similar results by repeat echo in 01/2021  ? COPD (chronic obstructive pulmonary disease) ([place])   ? Coronary artery disease   ? Dyspnea   ?  Essential hypertension   ? H. pylori infection 11/2016  ? With documented eradication via biopsy Feb 2019  ? Heart murmur   ? History of atrial fibrillation   ? November 2017  ? Hyperlipidemia   ? Peripheral vascular disease ([place])   ? Sleep apnea   ? Did not tolerate CPAP  ? Tubulovillous adenoma of rectum 04/2010  ? ?Family History  ?Problem Relation Age of Onset  ? CAD Mother   ? High blood pressure Mother   ? Heart attack Mother   ? Leukemia Father   ? Early death Father   ? Heart attack Sister   ? Diabetes Sister   ? Cancer Brother   ? Deep vein thrombosis Brother   ? Hypertension Daughter   ? Stroke Sister   ? Stroke Sister   ? Heart attack Sister   ? Deep vein thrombosis Sister   ? Hypertension Sister   ? Diabetes Sister   ? Arthritis Sister   ? Arthritis Sister   ? Arthritis Sister   ? Hypertension Sister   ? Heart attack Sister   ? Cancer Brother   ?     KIDNEY  ? Kidney disease Brother   ? Deep vein thrombosis Brother   ? Seizures Brother   ? Heart attack Brother   ? Colon cancer Neg Hx   ? Colon polyps Neg Hx   ? ?  Past Surgical History:  ?Procedure Laterality Date  ? CATARACT EXTRACTION W/PHACO Right 01/16/2016  ? Procedure: CATARACT EXTRACTION PHACO AND INTRAOCULAR LENS PLACEMENT RIGHT EYE CDE= 6.97;  Surgeon: Tonny Branch, MD;  Location: [facility];  Service: Ophthalmology;  Laterality: Right;  right  ? CATARACT EXTRACTION W/PHACO Left 01/30/2016  ? Procedure: CATARACT EXTRACTION PHACO AND INTRAOCULAR LENS PLACEMENT LEFT EYE CDE 3.13;  Surgeon: Tonny Branch, MD;  Location: [facility];  Service: Ophthalmology;  Laterality: Left;  left  ? COLONOSCOPY  2012  ? COLONOSCOPY N/A 12/04/2016  ? three 4-6 mm tubular adenomas, moderate size external and internal hemorrhoids, redundant left colon, 3 year surveillance  ? ESOPHAGOGASTRODUODENOSCOPY N/A 12/04/2016  ? Dr. Oneida Alar: normal esophagus, small hiatal hernia, chronic gastritis with H.pylori, peptic duodenitis, atypical lymphoid proliferation. Surveillance EGD Feb 2019  without abnormal cells, negative H.pylori  ? ESOPHAGOGASTRODUODENOSCOPY N/A 03/15/2017  ? mild gastritis, no atypical cells, negative H.pylori  ? EXCISION, SKI  08/2016  ? R THIGH  ? IR ANGIO INTRA EXTRACRAN SEL COM CAROTID INNOMINATE BILAT MOD SED  11/06/2020  ? IR ANGIO INTRA EXTRACRAN SEL COM CAROTID INNOMINATE BILAT MOD SED  03/06/2021  ? IR CT HEAD LTD  11/03/2020  ? IR INTRAVSC STENT CERV CAROTID W/EMB-PROT MOD SED INCL ANGIO  11/06/2020  ? IR PERCUTANEOUS ART THROMBECTOMY/INFUSION INTRACRANIAL INC DIAG ANGIO  11/03/2020  ? IR PTA INTRACRANIAL  11/04/2020  ? IR RADIOLOGIST EVAL & MGMT  01/23/2021  ? IR RADIOLOGIST EVAL & MGMT  04/15/2021  ? IR US GUIDE VASC ACCESS LEFT  11/03/2020  ? IR US GUIDE VASC ACCESS RIGHT  11/03/2020  ? IR US GUIDE VASC ACCESS RIGHT  11/06/2020  ? IR US GUIDE VASC ACCESS RIGHT  03/06/2021  ? RADIOLOGY WITH ANESTHESIA N/A 11/03/2020  ? Procedure: RADIOLOGY WITH ANESTHESIA;  Surgeon: Radiologist, Medication, MD;  Location: [facility];  Service: Radiology;  Laterality: N/A;  ? RADIOLOGY WITH ANESTHESIA N/A 11/06/2020  ? Procedure: STENTING;  Surgeon: Corrie Mckusick, DO;  Location: [facility];  Service: Anesthesiology;  Laterality: N/A;  ? RADIOLOGY WITH ANESTHESIA Left 03/06/2021  ? Procedure: CAROTID STENT;  Surgeon: Corrie Mckusick, DO;  Location: [facility];  Service: Anesthesiology;  Laterality: Left;  ? ? ?Short Social History:  ?Social History  ? ?Tobacco Use  ? Smoking status: Former  ?  Packs/day: 1.00  ?  Years: 45.00  ?  Pack years: 45.00  ?  Types: Cigarettes  ?  Quit date: 11/03/2020  ?  Years since quitting: 0.5  ? Smokeless tobacco: Never  ? Tobacco comments:  ?  started back - pack per day  ?Substance Use Topics  ? Alcohol use: Not Currently  ?  Comment: none since 11/03/20  ? ? ?No Known Allergies ? ?Current Outpatient Medications  ?Medication Sig Dispense Refill  ? apixaban (ELIQUIS) 5 MG TABS tablet Take 1 tablet (5 mg total) by mouth 2 (two) times daily. 180 tablet 3  ? atorvastatin (LIPITOR)  40 MG tablet Take 1 tablet (40 mg total) by mouth daily at 6 PM. 60 tablet 0  ? clopidogrel (PLAVIX) 75 MG tablet Take 1 tablet (75 mg total) by mouth daily. 60 tablet 0  ? dapagliflozin propanediol (FARXIGA) 10 MG TABS tablet Take 1 tablet (10 mg total) by mouth daily before breakfast. 90 tablet 3  ? Melatonin 10 MG CAPS Take 10 mg by mouth at bedtime as needed (sleep). (Patient not taking: Reported on 04/16/2021)    ? metoprolol succinate (TOPROL XL) 25  MG 24 hr tablet Take 1 tablet (25 mg total) by mouth daily. 90 tablet 1  ? sacubitril-valsartan (ENTRESTO) 97-103 MG Take 1 tablet by mouth 2 (two) times daily. 60 tablet 11  ? spironolactone (ALDACTONE) 25 MG tablet Take 25 mg by mouth daily.    ? varenicline (CHANTIX) 0.5 MG tablet Take 1 tablet (0.5 mg total) by mouth 2 (two) times daily. 240 tablet 0  ? ?No current facility-administered medications for this visit.  ? ? ?Review of Systems  ?Constitutional:  Constitutional negative. ?HENT: HENT negative.  ?Eyes: Eyes negative.  ?Respiratory: Respiratory negative.  ?Cardiovascular: Cardiovascular negative.  ?GI: Gastrointestinal negative.  ?Musculoskeletal: Musculoskeletal negative.  ?Skin: Skin negative.  ?Neurological: Neurological negative. ?Hematologic: Hematologic/lymphatic negative.  ?Psychiatric: Psychiatric negative.   ? ?   ?Objective:  ?Objective  ?Vitals:  ? 05/07/21 0936 05/07/21 0938  ?BP: (!) 177/79 (!) 165/82  ?Pulse: (!) 59   ?Resp: 20   ?Temp: 98.1 ?F (36.7 ?C)   ?SpO2: 97%   ? ? ? ?Physical Exam ?HENT:  ?   Head: Normocephalic.  ?   Nose: Nose normal.  ?Eyes:  ?   Pupils: Pupils are equal, round, and reactive to light.  ?Neck:  ?   Vascular: Carotid bruit present.  ?Cardiovascular:  ?   Rate and Rhythm: Normal rate.  ?Pulmonary:  ?   Effort: Pulmonary effort is normal.  ?   Breath sounds: Normal breath sounds.  ?Abdominal:  ?   General: Abdomen is flat.  ?   Palpations: Abdomen is soft. There is no mass.  ?Musculoskeletal:     ?   General: Normal  range of motion.  ?   Cervical back: Neck supple.  ?   Right lower leg: No edema.  ?   Left lower leg: No edema.  ?Skin: ?   General: Skin is warm and dry.  ?   Capillary Refill: Capillary refill takes less th

## 2021-05-09 ENCOUNTER — Other Ambulatory Visit: Payer: Self-pay | Admitting: *Deleted

## 2021-05-09 NOTE — Patient Instructions (Signed)
Visit Information ? ?Thank you for taking time to visit with me today. Please don't hesitate to contact me if I can be of assistance to you before our next scheduled telephone appointment. ? ?Following are the goals we discussed today:  ?Have granddaughter order blood pressure monitor.   ?Consider having carotid surgery to decrease risk of recurrent stroke. ? ?Our next appointment is by telephone in 1 month ? ?The patient verbalized understanding of instructions, educational materials, and care plan provided today and agreed to receive a mailed copy of patient instructions, educational materials, and care plan.  ? ?The patient has been provided with contact information for the care management team and has been advised to call with any health related questions or concerns.  ? ?[name], RN, MSN, CCM ?[facility] Care Management  ?Community Care Manager ?[phone] ? ?

## 2021-05-09 NOTE — Patient Outreach (Signed)
?[facility]) Care Management ?Telephonic RN Care Manager Note ? ? ?05/09/2021 ?Name:  Christopher Lang MRN:  [id number] DOB:  03-21-1955 ? ?Summary: ?Outgoing call placed to member, successful.  Denies any urgent concerns, encouraged to contact this care manager with questions.   ? ?Recommendations/Changes made from today's visit: ?Have granddaughter order blood pressure monitor.   ?Consider having carotid surgery to decrease risk of recurrent stroke. ? ?Subjective: ?Christopher Lang is an 66 y.o. year old male who is a primary patient of Hasanaj, Samul Dada, MD. The care management team was consulted for assistance with care management and/or care coordination needs.   ? ?Telephonic RN Care Manager completed Telephone Visit today. ? ?Objective:  ? ?Medications Reviewed Today   ? ? Reviewed by Leota Jacobsen, LPN (Licensed Practical Nurse) on 05/07/21 at [place] List Status: <None>  ? ?Medication Order Taking? Sig Documenting Provider Last Dose Status Informant  ?apixaban (ELIQUIS) 5 MG TABS tablet [id number] Yes Take 1 tablet (5 mg total) by mouth 2 (two) times daily. [name], NP Taking Active   ?atorvastatin (LIPITOR) 40 MG tablet [id number] Yes Take 1 tablet (40 mg total) by mouth daily at 6 PM. [name], PA-C Taking Active Self  ?clopidogrel (PLAVIX) 75 MG tablet [id number] Yes Take 1 tablet (75 mg total) by mouth daily. [name], PA-C Taking Active Self  ?dapagliflozin propanediol (FARXIGA) 10 MG TABS tablet [id number] Yes Take 1 tablet (10 mg total) by mouth daily before breakfast. Satira Sark, MD Taking Active   ?Melatonin 10 MG CAPS [id number] Yes Take 10 mg by mouth at bedtime as needed (sleep). [provider] Taking Active Self  ?         ?Med Note Jilda Roche A   Mon Jan 06, 2021 11:59 AM) Hasnt started  ?metoprolol succinate (TOPROL XL) 25 MG 24 hr tablet [id number] Yes Take 1 tablet (25 mg total) by mouth daily. Erma Heritage, PA-C Taking  Active Self  ?sacubitril-valsartan (ENTRESTO) 97-103 MG [id number] Yes Take 1 tablet by mouth 2 (two) times daily. [name], NP Taking Active   ?spironolactone (ALDACTONE) 25 MG tablet [id number] Yes Take 25 mg by mouth daily. [provider] Taking Active   ?varenicline (CHANTIX) 0.5 MG tablet [id number] Yes Take 1 tablet (0.5 mg total) by mouth 2 (two) times daily. [name], PA-C Taking Active Self  ? ?  ?  ? ?  ? ? ? ?SDOH:  (Social Determinants of Health) assessments and interventions performed:  ? ? ? ?Care Plan ? ?Review of patient past medical history, allergies, medications, health status, including review of consultants reports, laboratory and other test data, was performed as part of comprehensive evaluation for care management services.  ? ?Care Plan : [facility] Plan of Care (Adult)  ?Updates made by [name], RN since 05/09/2021 12:00 AM  ?  ? ?Problem: Knowledge deficit related to management of chronic conditions (HTN, CHF, and A-fib) evidenced by recent stroke   ?Priority: High  ?  ? ?Long-Range Goal: Management of chronic conditions (A-fib, CHF, HTN) and recovery from stroke evidenced by no readmissions   ?Start Date: 11/14/2020  ?Expected End Date: 11/14/2021  ?This Visit's Progress: On track  ?Recent Progress: On track  ?Priority: High  ?Note:   ?Current Barriers:  ?Knowledge Deficits related to plan of care for management of Atrial Fibrillation, CHF, HTN, and Stroke ?Chronic Disease Management support and education needs related to Atrial Fibrillation,  CHF, HTN, and Stroke ? ?RNCM Clinical Goal(s):  ?Patient will verbalize understanding of plan for management of Atrial Fibrillation, CHF, HTN, and stroke as evidenced by verbalization of plan of care for adequate management ?take all medications exactly as prescribed and will call provider for medication related questions as evidenced by member reported adherence ?attend all scheduled medical appointments: Neurology, cardiology &  vascular as evidenced by Neurology, cardiology & vascular ?not experience hospital admission as evidenced by review of EMR. Hospital Admissions in last 6 months = 1  through collaboration with RN Care manager, provider, and care team.  ? ?Interventions: ?Inter-disciplinary care team collaboration (see longitudinal plan of care) ?Evaluation of current treatment plan related to  self management and patient's adherence to plan as established by provider ? ? ?Hypertension Interventions: ?Last practice recorded BP readings:  ?BP Readings from Last 3 Encounters:  ?05/07/21 (!) 165/82  ?04/16/21 126/74  ?03/19/21 (!) 152/78  ?Most recent eGFR/CrCl: No results found for: EGFR  No components found for: CRCL ? ?Evaluation of current treatment plan related to hypertension self management and patient's adherence to plan as established by provider; ?Provided education to patient re: stroke prevention, s/s of heart attack and stroke; ?Reviewed medications with patient and discussed importance of compliance; ?Advised patient, providing education and rationale, to monitor blood pressure daily and record, calling PCP for findings outside established parameters;  ? ?Heart Failure Interventions: ?Provided education on low sodium diet; ?Reviewed Heart Failure Action Plan in depth and provided written copy; ?Assessed need for readable accurate scales in home; ?Provided education about placing scale on hard, flat surface; ?Discussed importance of daily weight and advised patient to weigh and record daily; ?Reviewed role of diuretics in prevention of fluid overload and management of heart failure; ?Discussed the importance of keeping all appointments with provider; ? ?Update 10/27 - Report weight today is 188.1 pounds, wife state this has been stable. ? ?AFIB Interventions:  ?  Counseled on increased risk of stroke due to Afib and benefits of anticoagulation for stroke prevention; ?Reviewed importance of adherence to anticoagulant exactly  as prescribed; ?Counseled on bleeding risk associated with Eliquis and Plavis and importance of self-monitoring for signs/symptoms of bleeding; ? ? ?Update 11/15 - Member report speech therapist is currently in the home to work with him.  Has follow up appointment with neurology on 12/15 and will start cardiac rehab on 12/21.  Was seen by cardiology on 11/3, BP, HR, and weights were all stable.  No reported issues noted, no medication changes.  Will follow up with cardiology in 6 months.  ? ?Patient Goals/Self-Care Activities: ?Patient will self administer medications as prescribed ?Patient will attend all scheduled provider appointments ?Patient will continue to perform ADL's independently ?Patient will continue to perform IADL's independently ? ?Follow Up Plan:  Telephone follow up appointment with care management team member scheduled for:  06/06/2021 ?The patient has been provided with contact information for the care management team and has been advised to call with any health related questions or concerns.  ? ? ?Update 12/13 - Report he has continued to improve well with therapy at home, speech is much clearer today than with initial assessment.  He has follow up with neuro this Thursday and will have orientation for cardiac rehab next week.  Once orientation is complete, he will have his rehab schedule.  State that he is tolerating his increased dose of Entresto without problems (dose changed on 11/18).  He continues to monitor blood pressure, HR, and weight  daily, unable to get to notebook for readings today but state they have been normal.  Does report weight was 185 pounds this morning, decreased from last outreach.  Report concern for problems with sleeping, inquires if any medications could cause this.  He is taking Chantix, side effect of insomnia.  He will discuss with provider during office visit this week.  State he also has follow up with PCP on 1/3. ? ? ?Update 1/10 - Member report doing well, was  seen by neurology, PCP, and IR since last outreach.  PCP increased Spironolactone to a full tablet daily instead of half due to elevated blood pressures, state it has not started to decrease.  Last read

## 2021-05-26 ENCOUNTER — Telehealth: Payer: Self-pay | Admitting: Neurology

## 2021-05-26 NOTE — Telephone Encounter (Signed)
LVM asking pt to call back and r/s 5/15 appt- Dr. Leonie Man out (no Mychart). ?

## 2021-05-27 NOTE — Progress Notes (Signed)
? ? ?Cardiology Office Note ? ?Date: 05/28/2021  ? ?ID: Christopher Lang, DOB 11-Aug-1955, MRN [id number] ? ?PCP:  Neale Burly, MD  ?Cardiologist:  Rozann Lesches, MD ?Electrophysiologist:  None  ? ?Chief Complaint  ?Patient presents with  ? Cardiac follow-up  ? ? ?History of Present Illness: ?Christopher Lang is a 66 y.o. male last seen in March by Ms. Strader PA-C.  He is here for a follow-up visit.  Reports no angina symptoms and stable NYHA class II dyspnea, no palpitations or syncope. ? ?Cardiac MRI obtained in April revealed LVEF 33% with LGE greater than 50% in the anterior, anteroseptal, and apical distribution consistent with transmural scar and nonviability.  Also small region of scar in the basal inferoseptum.  He was incidentally noted to have a left lower lobe mass that is possibly a lipoma, however CT imaging pending for further investigation. ? ?He is scheduled to see Dr. Lovena Le for discussion of prophylactic ICD. ? ?I reviewed his medications, we discussed stopping Chantix, he has not smoked any cigarettes since his hospitalization in October of last year. ? ?Past Medical History:  ?Diagnosis Date  ? Acute CVA (cerebrovascular accident) ([place]) 11/03/2020  ? Acute left-sided weakness 11/03/2020  ? Acute ST elevation myocardial infarction (STEMI) ([place]) 11/03/2020  ? a. s/p anterior STEMI in 10/2020 but not a candidate for invasive cath at that time given his acute CVA and having received TNK  ? Carotid artery disease ([place])   ? Cataract   ? Removed Dec 2017/Jan 2018  ? CHF (congestive heart failure) ([place])   ? a. EF 20-25% by echo in 10/2020, similar results by repeat echo in 01/2021  ? COPD (chronic obstructive pulmonary disease) ([place])   ? Coronary artery disease   ? Dyspnea   ? Essential hypertension   ? H. pylori infection 11/2016  ? With documented eradication via biopsy Feb 2019  ? Heart murmur   ? History of atrial fibrillation   ? November 2017  ? Hyperlipidemia   ? Peripheral vascular disease  ([place])   ? Sleep apnea   ? Did not tolerate CPAP  ? Tubulovillous adenoma of rectum 04/2010  ? ? ?Past Surgical History:  ?Procedure Laterality Date  ? CATARACT EXTRACTION W/PHACO Right 01/16/2016  ? Procedure: CATARACT EXTRACTION PHACO AND INTRAOCULAR LENS PLACEMENT RIGHT EYE CDE= 6.97;  Surgeon: Tonny Branch, MD;  Location: [facility];  Service: Ophthalmology;  Laterality: Right;  right  ? CATARACT EXTRACTION W/PHACO Left 01/30/2016  ? Procedure: CATARACT EXTRACTION PHACO AND INTRAOCULAR LENS PLACEMENT LEFT EYE CDE 3.13;  Surgeon: Tonny Branch, MD;  Location: [facility];  Service: Ophthalmology;  Laterality: Left;  left  ? COLONOSCOPY  2012  ? COLONOSCOPY N/A 12/04/2016  ? three 4-6 mm tubular adenomas, moderate size external and internal hemorrhoids, redundant left colon, 3 year surveillance  ? ESOPHAGOGASTRODUODENOSCOPY N/A 12/04/2016  ? Dr. Oneida Alar: normal esophagus, small hiatal hernia, chronic gastritis with H.pylori, peptic duodenitis, atypical lymphoid proliferation. Surveillance EGD Feb 2019 without abnormal cells, negative H.pylori  ? ESOPHAGOGASTRODUODENOSCOPY N/A 03/15/2017  ? mild gastritis, no atypical cells, negative H.pylori  ? EXCISION, SKI  08/2016  ? R THIGH  ? IR ANGIO INTRA EXTRACRAN SEL COM CAROTID INNOMINATE BILAT MOD SED  11/06/2020  ? IR ANGIO INTRA EXTRACRAN SEL COM CAROTID INNOMINATE BILAT MOD SED  03/06/2021  ? IR CT HEAD LTD  11/03/2020  ? IR INTRAVSC STENT CERV CAROTID W/EMB-PROT MOD SED INCL ANGIO  11/06/2020  ? IR PERCUTANEOUS ART  THROMBECTOMY/INFUSION INTRACRANIAL INC DIAG ANGIO  11/03/2020  ? IR PTA INTRACRANIAL  11/04/2020  ? IR RADIOLOGIST EVAL & MGMT  01/23/2021  ? IR RADIOLOGIST EVAL & MGMT  04/15/2021  ? IR US GUIDE VASC ACCESS LEFT  11/03/2020  ? IR US GUIDE VASC ACCESS RIGHT  11/03/2020  ? IR US GUIDE VASC ACCESS RIGHT  11/06/2020  ? IR US GUIDE VASC ACCESS RIGHT  03/06/2021  ? RADIOLOGY WITH ANESTHESIA N/A 11/03/2020  ? Procedure: RADIOLOGY WITH ANESTHESIA;  Surgeon: Radiologist, Medication,  MD;  Location: [facility];  Service: Radiology;  Laterality: N/A;  ? RADIOLOGY WITH ANESTHESIA N/A 11/06/2020  ? Procedure: STENTING;  Surgeon: Corrie Mckusick, DO;  Location: [place];  Service: Anesthesiology;  Laterality: N/A;  ? RADIOLOGY WITH ANESTHESIA Left 03/06/2021  ? Procedure: CAROTID STENT;  Surgeon: Corrie Mckusick, DO;  Location: [facility];  Service: Anesthesiology;  Laterality: Left;  ? ? ?Current Outpatient Medications  ?Medication Sig Dispense Refill  ? apixaban (ELIQUIS) 5 MG TABS tablet Take 1 tablet (5 mg total) by mouth 2 (two) times daily. 180 tablet 3  ? atorvastatin (LIPITOR) 40 MG tablet Take 1 tablet (40 mg total) by mouth daily at 6 PM. 60 tablet 0  ? clopidogrel (PLAVIX) 75 MG tablet Take 1 tablet (75 mg total) by mouth daily. 60 tablet 0  ? dapagliflozin propanediol (FARXIGA) 10 MG TABS tablet Take 1 tablet (10 mg total) by mouth daily before breakfast. 90 tablet 3  ? metoprolol succinate (TOPROL XL) 25 MG 24 hr tablet Take 1 tablet (25 mg total) by mouth daily. 90 tablet 1  ? sacubitril-valsartan (ENTRESTO) 97-103 MG Take 1 tablet by mouth 2 (two) times daily. 60 tablet 11  ? spironolactone (ALDACTONE) 25 MG tablet Take 25 mg by mouth daily.    ? ?No current facility-administered medications for this visit.  ? ?Allergies:  Patient has no known allergies.  ? ?ROS: No orthopnea or PND.  No leg swelling. ? ?Physical Exam: ?VS:  BP 118/70   Pulse 64   Ht 6' (1.829 m)   Wt 184 lb 6.4 oz (83.6 kg)   SpO2 99%   BMI 25.01 kg/m? , BMI Body mass index is 25.01 kg/m?. ? ?Wt Readings from Last 3 Encounters:  ?05/28/21 184 lb 6.4 oz (83.6 kg)  ?05/07/21 186 lb (84.4 kg)  ?04/16/21 187 lb (84.8 kg)  ?  ?General: Patient appears comfortable at rest. ?HEENT: Conjunctiva and lids normal. ?Neck: Supple, no elevated JVP or carotid bruits, no thyromegaly. ?Lungs: Clear to auscultation, nonlabored breathing at rest. ?Cardiac: Regular rate and rhythm, no S3 or significant systolic murmur, no pericardial  rub. ?Extremities: No pitting edema. ? ?ECG:  An ECG dated 03/06/2021 was personally reviewed today and demonstrated:  Sinus bradycardia with old anterior infarct pattern. ? ?Recent Labwork: ?12/02/2020: Magnesium 1.8 ?03/06/2021: Platelets 176 ?04/04/2021: ALT 19; AST 23; BUN 13; Creatinine, Ser 0.92; Hemoglobin 13.0; Potassium 3.7; Sodium 141  ?   ?Component Value Date/Time  ? CHOL 108 04/04/2021 0957  ? CHOL 160 06/23/2018 1445  ? TRIG 52 04/04/2021 0957  ? HDL 39 (L) 04/04/2021 0957  ? HDL 29 (L) 06/23/2018 1445  ? CHOLHDL 2.8 04/04/2021 0957  ? VLDL 10 04/04/2021 0957  ? LDLCALC 59 04/04/2021 0957  ? [place] 77 08/15/2019 1002  ? ? ?Other Studies Reviewed Today: ? ?Carotid Dopplers 04/10/2021: ?IMPRESSION: ?1. Widely patent right carotid stent without evidence of in-stent or ?edge stenosis. ?2. Moderate (50-69%) stenosis proximal left internal carotid  artery ?secondary to bulky heterogeneous atherosclerotic plaque. A suspected ?degree of stenosis is underestimated by ultrasound imaging given the ?plaque morphology on prior cerebral arteriogram. ?3. Both vertebral arteries are patent with normal antegrade flow. ? ?Cardiac MRI 04/25/2021: ?IMPRESSION: ?1. Subendocardial late gadolinium enhancement consistent with prior ?infarct in mid anterior/anteroseptal, apical anterior/septal walls ?and apex. LGE is greater than 50% transmural suggesting this region ?is nonviable. There is also a focal area of subendocardial LGE in ?basal inferoseptum suggesting small infarct in this region as well ?  ?2. Mild LV dilatation with moderate systolic dysfunction (EF 92%). ?Thinning/akinesis of mid anterior/anteroseptal, apical ?anterior/septal walls and apex. ?  ?3.  Normal RV size and systolic function (EF 44%) ?  ?4. Mass in left lower lobe of lung measuring 44m x 150m ?hyperintense on HASTE and LGE images. Could represent pleural ?lipoma, discussed with radiology and recommend CT chest with ?contrast for further  evaluation ? ?Assessment and Plan: ? ?1.  HFrEF with ischemic cardiomyopathy.  LVEF 33% by recent cardiac MRI and evidence of nonviability in infarct scar distribution.  He is clinically stable with NYHA class II symptoms.  Medications include T

## 2021-05-28 ENCOUNTER — Ambulatory Visit: Payer: Medicare Other | Admitting: Cardiology

## 2021-05-28 ENCOUNTER — Encounter: Payer: Self-pay | Admitting: Cardiology

## 2021-05-28 VITALS — BP 118/70 | HR 64 | Ht 72.0 in | Wt 184.4 lb

## 2021-05-28 DIAGNOSIS — I5020 Unspecified systolic (congestive) heart failure: Secondary | ICD-10-CM | POA: Diagnosis not present

## 2021-05-28 DIAGNOSIS — I25119 Atherosclerotic heart disease of native coronary artery with unspecified angina pectoris: Secondary | ICD-10-CM

## 2021-05-28 DIAGNOSIS — I480 Paroxysmal atrial fibrillation: Secondary | ICD-10-CM

## 2021-05-28 NOTE — Patient Instructions (Addendum)
Medication Instructions:  ?Your physician has recommended you make the following change in your medication:  ?Stop chantix ?Continue other medications the same ? ?Labwork: ?none ? ?Testing/Procedures: ?none ? ?Follow-Up: ?Your physician recommends that you schedule a follow-up appointment in: 3 months ? ?Any Other Special Instructions Will Be Listed Below (If Applicable). ? ?If you need a refill on your cardiac medications before your next appointment, please call your pharmacy. ?

## 2021-06-02 ENCOUNTER — Ambulatory Visit: Payer: Medicare Other | Admitting: Neurology

## 2021-06-04 ENCOUNTER — Encounter: Payer: Self-pay | Admitting: Neurology

## 2021-06-04 NOTE — Telephone Encounter (Signed)
Called pt to r/s 6/29 appt (Dr. Leonie Man out), pt's VM box was full. Sent cx letter. ?

## 2021-06-05 ENCOUNTER — Ambulatory Visit (HOSPITAL_COMMUNITY)
Admission: RE | Admit: 2021-06-05 | Discharge: 2021-06-05 | Disposition: A | Discharge status: Home / Self Care | Payer: Medicare Other | Source: Ambulatory Visit | Attending: Cardiology | Admitting: Cardiology

## 2021-06-05 DIAGNOSIS — J439 Emphysema, unspecified: Secondary | ICD-10-CM | POA: Diagnosis not present

## 2021-06-05 DIAGNOSIS — R918 Other nonspecific abnormal finding of lung field: Secondary | ICD-10-CM

## 2021-06-05 DIAGNOSIS — I700 Atherosclerosis of aorta: Secondary | ICD-10-CM | POA: Diagnosis not present

## 2021-06-05 MED ORDER — IOHEXOL 300 MG/ML  SOLN
80.0000 mL | Freq: Once | INTRAMUSCULAR | Status: AC | PRN
  Administered 2021-06-05: 80 mL via INTRAVENOUS

## 2021-06-06 ENCOUNTER — Other Ambulatory Visit: Payer: Self-pay | Admitting: *Deleted

## 2021-06-06 NOTE — Patient Outreach (Signed)
[facility]) Care Management  06/06/2021  Christopher Lang 08-01-1955 [id number]   Outgoing call placed to member, unsuccessful.  Unable to leave message, mailbox full.  Will follow up within the next 2-3 business days.  Christopher David, RN, MSN, [place] Manager [phone]

## 2021-06-11 ENCOUNTER — Other Ambulatory Visit: Payer: Self-pay | Admitting: *Deleted

## 2021-06-11 NOTE — Patient Outreach (Signed)
[facility] ([facility]) Care Management Telephonic RN Care Manager Note   06/11/2021 Name:  Christopher Lang MRN:  [id number] DOB:  07/22/1955  Summary: Outreach attempt #2, successful to member.  Denies any urgent concerns, encouraged to contact this care manager with questions.     Subjective: Christopher Lang is an 66 y.o. year old male who is a primary patient of Hasanaj, Xaje A, MD. The care management team was consulted for assistance with care management and/or care coordination needs.    Telephonic RN Care Manager completed Telephone Visit today.  Objective:   Medications Reviewed Today     Reviewed by McDowell, Samuel G, MD (Physician) on 05/28/21 at 1058  Med List Status: <None>   Medication Order Taking? Sig Documenting Provider Last Dose Status Informant  apixaban (ELIQUIS) 5 MG TABS tablet [id number] Yes Take 1 tablet (5 mg total) by mouth 2 (two) times daily. In[place], Laura R, NP Taking Active   atorvastatin (LIPITOR) 40 MG tablet [id number] Yes Take 1 tablet (40 mg total) by mouth daily at 6 PM. Francois, Marie Y, PA-C Taking Active Self  clopidogrel (PLAVIX) 75 MG tablet [id number] Yes Take 1 tablet (75 mg total) by mouth daily. Francois, Marie Y, PA-C Taking Active Self  dapagliflozin propanediol (FARXIGA) 10 MG TABS tablet [id number] Yes Take 1 tablet (10 mg total) by mouth daily before breakfast. McDowell, Samuel G, MD Taking Active   metoprolol succinate (TOPROL XL) 25 MG 24 hr tablet [id number] Yes Take 1 tablet (25 mg total) by mouth daily. Strader, Brittany M, PA-C Taking Active Self  sacubitril-valsartan (ENTRESTO) 97-103 MG [id number] Yes Take 1 tablet by mouth 2 (two) times daily. Ingold, Laura R, NP Taking Active   spironolactone (ALDACTONE) 25 MG tablet [id number] Yes Take 25 mg by mouth daily. [provider] Taking Active   varenicline (CHANTIX) 0.5 MG tablet [id number] Yes Take 1 tablet (0.5 mg total) by mouth 2 (two) times daily. Francois, Marie Y,  PA-C Taking Active Self             SDOH:  (Social Determinants of Health) assessments and interventions performed:     Care Plan  Review of patient past medical history, allergies, medications, health status, including review of consultants reports, laboratory and other test data, was performed as part of comprehensive evaluation for care management services.   Care Plan : RNCM Plan of Care (Adult)  Updates made by Lane, Monica, RN since 06/11/2021 12:00 AM     Problem: Knowledge deficit related to management of chronic conditions (HTN, CHF, and A-fib) evidenced by recent stroke   Priority: High     Long-Range Goal: Management of chronic conditions (A-fib, CHF, HTN) and recovery from stroke evidenced by no readmissions   Start Date: 11/14/2020  Expected End Date: 11/14/2021  This Visit's Progress: On track  Recent Progress: On track  Priority: High  Note:   Current Barriers:  Knowledge Deficits related to plan of care for management of Atrial Fibrillation, CHF, HTN, and Stroke Chronic Disease Management support and education needs related to Atrial Fibrillation, CHF, HTN, and Stroke  RNCM Clinical Goal(s):  Patient will verbalize understanding of plan for management of Atrial Fibrillation, CHF, HTN, and stroke as evidenced by verbalization of plan of care for adequate management take all medications exactly as prescribed and will call provider for medication related questions as evidenced by member reported adherence attend all scheduled medical appointments: Neurology, cardiology & vascular as evidenced by Neurology, cardiology & vascular not experience   hospital admission as evidenced by review of EMR. Hospital Admissions in last 6 months = 1  through collaboration with RN Care manager, provider, and care team.   Interventions: Inter-disciplinary care team collaboration (see longitudinal plan of care) Evaluation of current treatment plan related to  self management and  patient's adherence to plan as established by provider   Hypertension Interventions: Last practice recorded BP readings:  BP Readings from Last 3 Encounters:  05/28/21 118/70  05/07/21 (!) 165/82  04/16/21 126/74  Most recent eGFR/CrCl: No results found for: EGFR  No components found for: CRCL  Evaluation of current treatment plan related to hypertension self management and patient's adherence to plan as established by provider; Provided education to patient re: stroke prevention, s/s of heart attack and stroke; Reviewed medications with patient and discussed importance of compliance; Advised patient, providing education and rationale, to monitor blood pressure daily and record, calling PCP for findings outside established parameters;   Heart Failure Interventions: Provided education on low sodium diet; Reviewed Heart Failure Action Plan in depth and provided written copy; Assessed need for readable accurate scales in home; Provided education about placing scale on hard, flat surface; Discussed importance of daily weight and advised patient to weigh and record daily; Reviewed role of diuretics in prevention of fluid overload and management of heart failure; Discussed the importance of keeping all appointments with provider;  Update 10/27 - Report weight today is 188.1 pounds, wife state this has been stable.  AFIB Interventions:    Counseled on increased risk of stroke due to Afib and benefits of anticoagulation for stroke prevention; Reviewed importance of adherence to anticoagulant exactly as prescribed; Counseled on bleeding risk associated with Eliquis and Plavis and importance of self-monitoring for signs/symptoms of bleeding;   Update 11/15 - Member report speech therapist is currently in the home to work with him.  Has follow up appointment with neurology on 12/15 and will start cardiac rehab on 12/21.  Was seen by cardiology on 11/3, BP, HR, and weights were all stable.  No  reported issues noted, no medication changes.  Will follow up with cardiology in 6 months.   Patient Goals/Self-Care Activities: Patient will self administer medications as prescribed Patient will attend all scheduled provider appointments Patient will continue to perform ADL's independently Patient will continue to perform IADL's independently   Update 12/13 - Report he has continued to improve well with therapy at home, speech is much clearer today than with initial assessment.  He has follow up with neuro this Thursday and will have orientation for cardiac rehab next week.  Once orientation is complete, he will have his rehab schedule.  State that he is tolerating his increased dose of Entresto without problems (dose changed on 11/18).  He continues to monitor blood pressure, HR, and weight daily, unable to get to notebook for readings today but state they have been normal.  Does report weight was 185 pounds this morning, decreased from last outreach.  Report concern for problems with sleeping, inquires if any medications could cause this.  He is taking Chantix, side effect of insomnia.  He will discuss with provider during office visit this week.  State he also has follow up with PCP on 1/3.   Update 1/10 - Member report doing well, was seen by neurology, PCP, and IR since last outreach.  PCP increased Spironolactone to a full tablet daily instead of half due to elevated blood pressures, state it has not started to decrease.  Last reading  was 140/78.  Aware that he will need carotid procedure for revascularization, waiting for IR office to call back to schedule surgery date.  He has planned to stay in hospital at least overnight, denies questions about procedure.    Update 2/7 - Spoke to member, has been seen by cardiology for Echo, EF remains 20-25%.  Will have medication changes (Metoprolol to Toprol XL), waiting for this to be delivered, cardiology follow up scheduled for 3/1.  There has been  discussion regarding ICD placement if condition does not improve.  Weight has been stable, today was 184 pounds. Report blood pressure range 140-150/70-80 and HR range 40-50's.  He was scheduled to have cardiac rehab orientation however decision was made to wait until after carotid procedure.  He is still waiting on call to schedule.  Call was place to IR, spoke with Vicky, state she requested [place] scheduler, Jennifer, to call for date.  Call placed to Jennifer ([phone]), message left to call this care manager and/or member directly.   Update 2/28 - Member report he went to hospital for carotid procedure but did not have it.  State provider decided to manage member medically instead.  He will have follow up with cardiology tomorrow and with neurology on 5/15.  He state he has not been consistent with managing blood pressure or heart rate daily, does not know if machine is accurate.  He will take it with him to appointment tomorrow to compare.  Denies any signs/symptoms of stroke, no chest pain or discomfort.   Update 3/28 - Per member, has had his cardiology appointment as well, concern discussed regarding current EF of 20-25%.  He will see cardiology again on 3/29 and is now scheduled for cardiac MRI on 4/7, will follow up with cardiology to review results.  Report he also had repeat carotid studies completed last week, will follow up with vascular provider today for results.  He state he took his current blood pressure machine to provider visit and was told that it was no longer working.  He is in the process of purchasing a new one.  Discussed [employer] benefits for over the counter products, state he will review options as well as look in stores.  Verbalizes understanding of importance of daily monitoring.  He is consistent with daily weights, report he has remained stable at 184-185 pounds.  Denies any shortness of breath or chest discomfort.  Verbalizes correct foods that he is eating to adhere to  low sodium diet.    Update 4/21 - Report he is doing "good."  Still has not obtained blood pressure monitor to record daily BP and HR, state he looked in the stores locally and couldn't find the brand that was recommended, [employer].  Advised to order from sites such as [employer], state he will ask his granddaughter to help with this.  He was seen by cardiology on 3/29, in the process of workup for ICD placement.  In need of a chest CT to further evaluate potential mass.  Was also seen by vascular provider on 4/19, recommended to have carotid endarterectomy however member state his wife is hesitant about him having another surgery, fear he will have a heart attack on the table.  Discussed the risk of recurrent stroke if procedure not done.  He verbalizes understanding, will discuss with his wife, state she may be more open once heart is more stable with ICD placement.  Upcoming appointments for cardiology on 5/10, Neurology on 5/15 (will discuss concern for   numbness and tingling in hands and feet), chest CT on 5/18, and with EP provider on 6/28.  He walks daily to help with management of chronic conditions, weight today was 185 pounds.    Update 5/24 - Member state he is well.  Report chest CT completed, no signs of cancer but reportedly has emphysema.  He will follow up with PCP in July, he will inquire about a referral to pulmonary if needed at that time.  He has been taken off chantix, no further cigarette cravings.  Neurology appointment for 5/15 has been rescheduled for 9/7 due to provider being out of the office.  Recommendation remains for him to have ICD and carotid endarterectomy, state wife remains skeptical.  Discussed difference between the two, education regarding procedure and after care mailed to the home.  He will discuss with wife prior to appointment with EP provider next month.    Follow Up Plan:  Telephone follow up appointment with care management team member scheduled for:  07/09/2021 The  patient has been provided with contact information for the care management team and has been advised to call with any health related questions or concerns.         Plan:  Telephone follow up appointment with care management team member scheduled for:  1 month The patient has been provided with contact information for the care management team and has been advised to call with any health related questions or concerns.   Monica Lane, RN, MSN, CCM [facility] Care Management  Community Care Manager [phone]    

## 2021-07-06 ENCOUNTER — Other Ambulatory Visit: Payer: Self-pay | Admitting: Student

## 2021-07-09 ENCOUNTER — Other Ambulatory Visit: Payer: Self-pay | Admitting: *Deleted

## 2021-07-09 NOTE — Patient Outreach (Signed)
[facility]) Care Management Telephonic RN Care Manager Note   07/09/2021 Name:  Christopher Lang MRN:  [id number] DOB:  09-29-1955  Summary: Christopher Lang call placed to member, successful.  Denies any urgent concerns, encouraged to contact this care manager with questions.    Subjective: Christopher Lang is an 66 y.o. year old male who is a primary patient of Hasanaj, Samul Dada, MD. The care management team was consulted for assistance with care management and/or care coordination needs.    Telephonic RN Care Manager completed Telephone Visit today.  Objective:   Medications Reviewed Today     Reviewed by Satira Sark, MD (Physician) on 05/28/21 at 1058  Med List Status: <None>   Medication Order Taking? Sig Documenting Provider Last Dose Status Informant  apixaban (ELIQUIS) 5 MG TABS tablet [id number] Yes Take 1 tablet (5 mg total) by mouth 2 (two) times daily. [name], NP Taking Active   atorvastatin (LIPITOR) 40 MG tablet [id number] Yes Take 1 tablet (40 mg total) by mouth daily at 6 PM. [name], PA-C Taking Active Self  clopidogrel (PLAVIX) 75 MG tablet [id number] Yes Take 1 tablet (75 mg total) by mouth daily. [name], PA-C Taking Active Self  dapagliflozin propanediol (FARXIGA) 10 MG TABS tablet [id number] Yes Take 1 tablet (10 mg total) by mouth daily before breakfast. Satira Sark, MD Taking Active   metoprolol succinate (TOPROL XL) 25 MG 24 hr tablet [id number] Yes Take 1 tablet (25 mg total) by mouth daily. Erma Heritage, PA-C Taking Active Self  sacubitril-valsartan (ENTRESTO) 97-103 MG [id number] Yes Take 1 tablet by mouth 2 (two) times daily. [name], NP Taking Active   spironolactone (ALDACTONE) 25 MG tablet [id number] Yes Take 25 mg by mouth daily. [provider] Taking Active   varenicline (CHANTIX) 0.5 MG tablet [id number] Yes Take 1 tablet (0.5 mg total) by mouth 2 (two) times daily. [name],  PA-C Taking Active Self             SDOH:  (Social Determinants of Health) assessments and interventions performed:     Care Plan  Review of patient past medical history, allergies, medications, health status, including review of consultants reports, laboratory and other test data, was performed as part of comprehensive evaluation for care management services.   Care Plan : [facility] Plan of Care (Adult)  Updates made by [name], RN since 07/09/2021 12:00 AM     Problem: Knowledge deficit related to management of chronic conditions (HTN, CHF, and A-fib) evidenced by recent stroke   Priority: High     Long-Range Goal: Management of chronic conditions (A-fib, CHF, HTN) and recovery from stroke evidenced by no readmissions   Start Date: 11/14/2020  Expected End Date: 11/14/2021  This Visit's Progress: On track  Recent Progress: On track  Priority: High  Note:   Current Barriers:  Knowledge Deficits related to plan of care for management of Atrial Fibrillation, CHF, HTN, and Stroke Chronic Disease Management support and education needs related to Atrial Fibrillation, CHF, HTN, and Stroke  RNCM Clinical Goal(s):  Patient will verbalize understanding of plan for management of Atrial Fibrillation, CHF, HTN, and stroke as evidenced by verbalization of plan of care for adequate management take all medications exactly as prescribed and will call provider for medication related questions as evidenced by member reported adherence attend all scheduled medical appointments: Neurology, cardiology & vascular as evidenced by Neurology, cardiology & vascular not experience hospital  admission as evidenced by review of EMR. Hospital Admissions in last 6 months = 1  through collaboration with RN Care manager, provider, and care team.   Interventions: Inter-disciplinary care team collaboration (see longitudinal plan of care) Evaluation of current treatment plan related to  self management and  patient's adherence to plan as established by provider   Hypertension Interventions: Last practice recorded BP readings:  BP Readings from Last 3 Encounters:  05/28/21 118/70  05/07/21 (!) 165/82  04/16/21 126/74  Most recent eGFR/CrCl: No results found for: EGFR  No components found for: CRCL  Evaluation of current treatment plan related to hypertension self management and patient's adherence to plan as established by provider; Provided education to patient re: stroke prevention, s/s of heart attack and stroke; Reviewed medications with patient and discussed importance of compliance; Advised patient, providing education and rationale, to monitor blood pressure daily and record, calling PCP for findings outside established parameters;   Heart Failure Interventions: Provided education on low sodium diet; Reviewed Heart Failure Action Plan in depth and provided written copy; Assessed need for readable accurate scales in home; Provided education about placing scale on hard, flat surface; Discussed importance of daily weight and advised patient to weigh and record daily; Reviewed role of diuretics in prevention of fluid overload and management of heart failure; Discussed the importance of keeping all appointments with provider;  Update 10/27 - Report weight today is 188.1 pounds, wife state this has been stable.  AFIB Interventions:    Counseled on increased risk of stroke due to Afib and benefits of anticoagulation for stroke prevention; Reviewed importance of adherence to anticoagulant exactly as prescribed; Counseled on bleeding risk associated with Eliquis and Plavis and importance of self-monitoring for signs/symptoms of bleeding;   Update 11/15 - Member report speech therapist is currently in the home to work with him.  Has follow up appointment with neurology on 12/15 and will start cardiac rehab on 12/21.  Was seen by cardiology on 11/3, BP, HR, and weights were all stable.  No  reported issues noted, no medication changes.  Will follow up with cardiology in 6 months.   Patient Goals/Self-Care Activities: Patient will self administer medications as prescribed Patient will attend all scheduled provider appointments Patient will continue to perform ADL's independently Patient will continue to perform IADL's independently   Update 12/13 - Report he has continued to improve well with therapy at home, speech is much clearer today than with initial assessment.  He has follow up with neuro this Thursday and will have orientation for cardiac rehab next week.  Once orientation is complete, he will have his rehab schedule.  State that he is tolerating his increased dose of Entresto without problems (dose changed on 11/18).  He continues to monitor blood pressure, HR, and weight daily, unable to get to notebook for readings today but state they have been normal.  Does report weight was 185 pounds this morning, decreased from last outreach.  Report concern for problems with sleeping, inquires if any medications could cause this.  He is taking Chantix, side effect of insomnia.  He will discuss with provider during office visit this week.  State he also has follow up with PCP on 1/3.   Update 1/10 - Member report doing well, was seen by neurology, PCP, and IR since last outreach.  PCP increased Spironolactone to a full tablet daily instead of half due to elevated blood pressures, state it has not started to decrease.  Last reading was  140/78.  Aware that he will need carotid procedure for revascularization, waiting for IR office to call back to schedule surgery date.  He has planned to stay in hospital at least overnight, denies questions about procedure.    Update 2/7 - Spoke to member, has been seen by cardiology for Echo, EF remains 20-25%.  Will have medication changes (Metoprolol to Toprol XL), waiting for this to be delivered, cardiology follow up scheduled for 3/1.  There has been  discussion regarding ICD placement if condition does not improve.  Weight has been stable, today was 184 pounds. Report blood pressure range 140-150/70-80 and HR range 40-50's.  He was scheduled to have cardiac rehab orientation however decision was made to wait until after carotid procedure.  He is still waiting on call to schedule.  Call was place to IR, spoke with Olegario Shearer, state she requested [facility] scheduler, Anderson Malta, to call for date.  Call placed to Anderson Malta [phone]), message left to call this care manager and/or member directly.   Update 2/28 - Member report he went to hospital for carotid procedure but did not have it.  State provider decided to manage member medically instead.  He will have follow up with cardiology tomorrow and with neurology on 5/15.  He state he has not been consistent with managing blood pressure or heart rate daily, does not know if machine is accurate.  He will take it with him to appointment tomorrow to compare.  Denies any signs/symptoms of stroke, no chest pain or discomfort.   Update 3/28 - Per member, has had his cardiology appointment as well, concern discussed regarding current EF of 20-25%.  He will see cardiology again on 3/29 and is now scheduled for cardiac MRI on 4/7, will follow up with cardiology to review results.  Report he also had repeat carotid studies completed last week, will follow up with vascular provider today for results.  He state he took his current blood pressure machine to provider visit and was told that it was no longer working.  He is in the process of purchasing a new one.  Discussed [employer] benefits for over the counter products, state he will review options as well as look in stores.  Verbalizes understanding of importance of daily monitoring.  He is consistent with daily weights, report he has remained stable at 184-185 pounds.  Denies any shortness of breath or chest discomfort.  Verbalizes correct foods that he is eating to adhere to  low sodium diet.    Update 4/21 - Report he is doing "good."  Still has not obtained blood pressure monitor to record daily BP and HR, state he looked in the stores locally and couldn't find the brand that was recommended, [employer].  Advised to order from sites such as [place], state he will ask his granddaughter to help with this.  He was seen by cardiology on 3/29, in the process of workup for ICD placement.  In need of a chest CT to further evaluate potential mass.  Was also seen by vascular provider on 4/19, recommended to have carotid endarterectomy however member state his wife is hesitant about him having another surgery, fear he will have a heart attack on the table.  Discussed the risk of recurrent stroke if procedure not done.  He verbalizes understanding, will discuss with his wife, state she may be more open once heart is more stable with ICD placement.  Upcoming appointments for cardiology on 5/10, Neurology on 5/15 (will discuss concern for numbness  and tingling in hands and feet), chest CT on 5/18, and with EP provider on 6/28.  He walks daily to help with management of chronic conditions, weight today was 185 pounds.    Update 5/24 - Member state he is well.  Report chest CT completed, no signs of cancer but reportedly has emphysema.  He will follow up with PCP in July, he will inquire about a referral to pulmonary if needed at that time.  He has been taken off chantix, no further cigarette cravings.  Neurology appointment for 5/15 has been rescheduled for 9/7 due to provider being out of the office.  Recommendation remains for him to have ICD and carotid endarterectomy, state wife remains skeptical.  Discussed difference between the two, education regarding procedure and after care mailed to the home.  He will discuss with wife prior to appointment with EP provider next month.   Update 6/21 - Member report he has appointment with EP specialist next week to schedule ICD placement.  He is also  interested in having intervention for carotid stenosis again, will follow up with vascular surgeon after ICD is placed.  Weight has been stable, now 183 pounds, denies any shortness of breath.  Discussed increasing exercise, advised to [facility] program at [facility], state he has Silver Sneakers with his insurance and plans to follow up.  Appointment with PCP scheduled for 7/18.     Follow Up Plan:  Telephone follow up appointment with care management team member scheduled for:  3 months The patient has been provided with contact information for the care management team and has been advised to call with any health related questions or concerns.         Plan:  Telephone follow up appointment with care management team member scheduled for:  3 months The patient has been provided with contact information for the care management team and has been advised to call with any health related questions or concerns.   [name], RN, MSN, [place] Manager [phone]

## 2021-07-16 ENCOUNTER — Ambulatory Visit: Payer: Medicare Other | Admitting: Internal Medicine

## 2021-07-16 ENCOUNTER — Other Ambulatory Visit (HOSPITAL_COMMUNITY)
Admission: RE | Admit: 2021-07-16 | Discharge: 2021-07-16 | Disposition: A | Discharge status: Home / Self Care | Payer: Medicare Other | Source: Ambulatory Visit | Attending: Internal Medicine | Admitting: Internal Medicine

## 2021-07-16 ENCOUNTER — Encounter: Payer: Self-pay | Admitting: *Deleted

## 2021-07-16 ENCOUNTER — Encounter: Payer: Self-pay | Admitting: Internal Medicine

## 2021-07-16 VITALS — BP 136/70 | HR 70 | Ht 72.0 in | Wt 183.0 lb

## 2021-07-16 DIAGNOSIS — I255 Ischemic cardiomyopathy: Secondary | ICD-10-CM | POA: Insufficient documentation

## 2021-07-16 LAB — BASIC METABOLIC PANEL
Anion gap: 6 (ref 5–15)
BUN: 11 mg/dL (ref 8–23)
CO2: 27 mmol/L (ref 22–32)
Calcium: 9.8 mg/dL (ref 8.9–10.3)
Chloride: 107 mmol/L (ref 98–111)
Creatinine, Ser: 0.76 mg/dL (ref 0.61–1.24)
GFR, Estimated: >60 mL/min (ref >60)
Glucose, Bld: 75 mg/dL (ref 70–99)
Potassium: 3.6 mmol/L (ref 3.5–5.1)
Sodium: 140 mmol/L (ref 135–145)

## 2021-07-16 LAB — CBC
HCT: 43.4 % (ref 39.0–52.0)
Hemoglobin: 14.7 g/dL (ref 13.0–17.0)
MCH: 32 pg (ref 26.0–34.0)
MCHC: 33.9 g/dL (ref 30.0–36.0)
MCV: 94.3 fL (ref 80.0–100.0)
Platelets: 180 10*3/uL (ref 150–400)
RBC: 4.6 MIL/uL (ref 4.22–5.81)
RDW: 11.7 % (ref 11.5–15.5)
WBC: 7.3 10*3/uL (ref 4.0–10.5)
nRBC: 0 % (ref 0.0–0.2)

## 2021-07-16 NOTE — Progress Notes (Signed)
HPI Christopher Lang is referred by Dr. Domenic Polite for evaluation and consideration for ICD insertion for primary prevention of malignant ventricular arrhythmias. He is a pleasant 66 yo man who sustained an anterior MI 9 months ago. He has been placed on GDMT and has class 2 symptoms. He has an EF by CMRI of 33%. He has not had syncope. He also has known carotid disease and COPD. No palpitations.  No Known Allergies   Current Outpatient Medications  Medication Sig Dispense Refill   apixaban (ELIQUIS) 5 MG TABS tablet Take 1 tablet (5 mg total) by mouth 2 (two) times daily. 180 tablet 3   atorvastatin (LIPITOR) 40 MG tablet Take 1 tablet (40 mg total) by mouth daily at 6 PM. 60 tablet 0   clopidogrel (PLAVIX) 75 MG tablet Take 1 tablet (75 mg total) by mouth daily. 60 tablet 0   dapagliflozin propanediol (FARXIGA) 10 MG TABS tablet Take 1 tablet (10 mg total) by mouth daily before breakfast. 90 tablet 3   metoprolol succinate (TOPROL-XL) 25 MG 24 hr tablet TAKE 1 TABLET BY MOUTH DAILY 90 tablet 2   sacubitril-valsartan (ENTRESTO) 97-103 MG Take 1 tablet by mouth 2 (two) times daily. 60 tablet 11   spironolactone (ALDACTONE) 25 MG tablet Take 25 mg by mouth daily.     No current facility-administered medications for this visit.     Past Medical History:  Diagnosis Date   Acute CVA (cerebrovascular accident) ([place]) 11/03/2020   Acute left-sided weakness 11/03/2020   Acute ST elevation myocardial infarction (STEMI) ([place]) 11/03/2020   a. s/p anterior STEMI in 10/2020 but not a candidate for invasive cath at that time given his acute CVA and having received TNK   Carotid artery disease [facility])    Cataract    Removed Dec 2017/Jan 2018   CHF (congestive heart failure) ([place])    a. EF 20-25% by echo in 10/2020, similar results by repeat echo in 01/2021   COPD (chronic obstructive pulmonary disease) ([place])    Coronary artery disease    Dyspnea    Essential hypertension    H. pylori infection  11/2016   With documented eradication via biopsy Feb 2019   Heart murmur    History of atrial fibrillation    November 2017   Hyperlipidemia    Peripheral vascular disease ([place])    Sleep apnea    Did not tolerate CPAP   Tubulovillous adenoma of rectum 04/2010    ROS:   All systems reviewed and negative except as noted in the HPI.   Past Surgical History:  Procedure Laterality Date   CATARACT EXTRACTION W/PHACO Right 01/16/2016   Procedure: CATARACT EXTRACTION PHACO AND INTRAOCULAR LENS PLACEMENT RIGHT EYE CDE= 6.97;  Surgeon: Tonny Branch, MD;  Location: [facility];  Service: Ophthalmology;  Laterality: Right;  right   CATARACT EXTRACTION W/PHACO Left 01/30/2016   Procedure: CATARACT EXTRACTION PHACO AND INTRAOCULAR LENS PLACEMENT LEFT EYE CDE 3.13;  Surgeon: Tonny Branch, MD;  Location: [facility];  Service: Ophthalmology;  Laterality: Left;  left   COLONOSCOPY  2012   COLONOSCOPY N/A 12/04/2016   three 4-6 mm tubular adenomas, moderate size external and internal hemorrhoids, redundant left colon, 3 year surveillance   ESOPHAGOGASTRODUODENOSCOPY N/A 12/04/2016   Dr. Oneida Alar: normal esophagus, small hiatal hernia, chronic gastritis with H.pylori, peptic duodenitis, atypical lymphoid proliferation. Surveillance EGD Feb 2019 without abnormal cells, negative H.pylori   ESOPHAGOGASTRODUODENOSCOPY N/A 03/15/2017   mild gastritis, no atypical cells, negative H.pylori  EXCISION, SKI  08/2016   R THIGH   IR ANGIO INTRA EXTRACRAN SEL COM CAROTID INNOMINATE BILAT MOD SED  11/06/2020   IR ANGIO INTRA EXTRACRAN SEL COM CAROTID INNOMINATE BILAT MOD SED  03/06/2021   IR CT HEAD LTD  11/03/2020   IR INTRAVSC STENT CERV CAROTID W/EMB-PROT MOD SED INCL ANGIO  11/06/2020   IR PERCUTANEOUS ART THROMBECTOMY/INFUSION INTRACRANIAL INC DIAG ANGIO  11/03/2020   IR PTA INTRACRANIAL  11/04/2020   IR RADIOLOGIST EVAL & MGMT  01/23/2021   IR RADIOLOGIST EVAL & MGMT  04/15/2021   IR US GUIDE VASC ACCESS LEFT  11/03/2020    IR US GUIDE VASC ACCESS RIGHT  11/03/2020   IR US GUIDE VASC ACCESS RIGHT  11/06/2020   IR US GUIDE VASC ACCESS RIGHT  03/06/2021   RADIOLOGY WITH ANESTHESIA N/A 11/03/2020   Procedure: RADIOLOGY WITH ANESTHESIA;  Surgeon: Radiologist, Medication, MD;  Location: [place];  Service: Radiology;  Laterality: N/A;   RADIOLOGY WITH ANESTHESIA N/A 11/06/2020   Procedure: STENTING;  Surgeon: Corrie Mckusick, DO;  Location: [place];  Service: Anesthesiology;  Laterality: N/A;   RADIOLOGY WITH ANESTHESIA Left 03/06/2021   Procedure: CAROTID STENT;  Surgeon: Corrie Mckusick, DO;  Location: [place];  Service: Anesthesiology;  Laterality: Left;     Family History  Problem Relation Age of Onset   CAD Mother    High blood pressure Mother    Heart attack Mother    Leukemia Father    Early death Father    Heart attack Sister    Diabetes Sister    Cancer Brother    Deep vein thrombosis Brother    Hypertension Daughter    Stroke Sister    Stroke Sister    Heart attack Sister    Deep vein thrombosis Sister    Hypertension Sister    Diabetes Sister    Arthritis Sister    Arthritis Sister    Arthritis Sister    Hypertension Sister    Heart attack Sister    Cancer Brother        KIDNEY   Kidney disease Brother    Deep vein thrombosis Brother    Seizures Brother    Heart attack Brother    Colon cancer Neg Hx    Colon polyps Neg Hx      Social History   Socioeconomic History   Marital status: Married    Spouse name: Consulting civil engineer   Number of children: 2   Years of education: GED   Highest education level: Not on file  Occupational History   Occupation: disability    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  Tobacco Use   Smoking status: Former    Packs/day: 1.00    Years: 45.00    Total pack years: 45.00    Types: Cigarettes    Quit date: 11/03/2020    Years since quitting: 0.6   Smokeless tobacco: Never   Tobacco comments:    started back - pack per day  Vaping Use   Vaping Use: Never used  Substance  and Sexual Activity   Alcohol use: Not Currently    Comment: none since 11/03/20   Drug use: No   Sexual activity: Yes  Other Topics Concern   Not on file  Social History Narrative   Lives with wife Christopher Lang   Leisure - couch potato   Social Determinants of Health   Financial Resource Strain: Low Risk  (11/14/2020)   Overall Financial Resource Strain (CARDIA)  Difficulty of Paying Living Expenses: Not hard at all  Food Insecurity: No Food Insecurity (11/14/2020)   Hunger Vital Sign    Worried About Running Out of Food in the Last Year: Never true    Ran Out of Food in the Last Year: Never true  Transportation Needs: No Transportation Needs (11/14/2020)   PRAPARE - Hydrologist (Medical): No    Lack of Transportation (Non-Medical): No  Physical Activity: Inactive (11/14/2020)   Exercise Vital Sign    Days of Exercise per Week: 0 days    Minutes of Exercise per Session: 0 min  Stress: No Stress Concern Present (11/14/2020)   [place]    Feeling of Stress : Only a little  Social Connections: Not on file  Intimate Partner Violence: Not At Risk (11/14/2020)   Humiliation, Afraid, Rape, and Kick questionnaire    Fear of Current or Ex-Partner: No    Emotionally Abused: No    Physically Abused: No    Sexually Abused: No     BP 136/70   Pulse 70   Ht 6' (1.829 m)   Wt 183 lb (83 kg)   SpO2 98%   BMI 24.82 kg/m   Physical Exam:  Well appearing NAD HEENT: Unremarkable Neck:  No JVD, no thyromegally Lymphatics:  No adenopathy Back:  No CVA tenderness Lungs:  Clear with no wheezes HEART:  Regular rate rhythm, no murmurs, no rubs, no clicks Abd:  soft, positive bowel sounds, no organomegally, no rebound, no guarding Ext:  2 plus pulses, no edema, no cyanosis, no clubbing Skin:  No rashes no nodules Neuro:  CN II through XII intact, motor grossly intact  EKG - reviewed.  nsr  Assess/Plan:  ICM - he is s/p MI with an EF of 33% despite GDMT. I discussed the indications/risks/benefits/goals/expectations of ICD insertion and he wishes to proceed. Chronic systolic heart failure - his symptoms are class 2. He will continue his current meds.  Carleene Overlie Samoria Fedorko,MD

## 2021-07-16 NOTE — H&P (View-Only) (Signed)
HPI Christopher Lang is referred by Dr. Domenic Polite for evaluation and consideration for ICD insertion for primary prevention of malignant ventricular arrhythmias. He is a pleasant 66 yo man who sustained an anterior MI 9 months ago. He has been placed on GDMT and has class 2 symptoms. He has an EF by CMRI of 33%. He has not had syncope. He also has known carotid disease and COPD. No palpitations.  No Known Allergies   Current Outpatient Medications  Medication Sig Dispense Refill   apixaban (ELIQUIS) 5 MG TABS tablet Take 1 tablet (5 mg total) by mouth 2 (two) times daily. 180 tablet 3   atorvastatin (LIPITOR) 40 MG tablet Take 1 tablet (40 mg total) by mouth daily at 6 PM. 60 tablet 0   clopidogrel (PLAVIX) 75 MG tablet Take 1 tablet (75 mg total) by mouth daily. 60 tablet 0   dapagliflozin propanediol (FARXIGA) 10 MG TABS tablet Take 1 tablet (10 mg total) by mouth daily before breakfast. 90 tablet 3   metoprolol succinate (TOPROL-XL) 25 MG 24 hr tablet TAKE 1 TABLET BY MOUTH DAILY 90 tablet 2   sacubitril-valsartan (ENTRESTO) 97-103 MG Take 1 tablet by mouth 2 (two) times daily. 60 tablet 11   spironolactone (ALDACTONE) 25 MG tablet Take 25 mg by mouth daily.     No current facility-administered medications for this visit.     Past Medical History:  Diagnosis Date   Acute CVA (cerebrovascular accident) ([place]) 11/03/2020   Acute left-sided weakness 11/03/2020   Acute ST elevation myocardial infarction (STEMI) ([place]) 11/03/2020   a. s/p anterior STEMI in 10/2020 but not a candidate for invasive cath at that time given his acute CVA and having received TNK   Carotid artery disease [place])    Cataract    Removed Dec 2017/Jan 2018   CHF (congestive heart failure) ([place])    a. EF 20-25% by echo in 10/2020, similar results by repeat echo in 01/2021   COPD (chronic obstructive pulmonary disease) ([place])    Coronary artery disease    Dyspnea    Essential hypertension    H. pylori infection  11/2016   With documented eradication via biopsy Feb 2019   Heart murmur    History of atrial fibrillation    November 2017   Hyperlipidemia    Peripheral vascular disease ([place])    Sleep apnea    Did not tolerate CPAP   Tubulovillous adenoma of rectum 04/2010    ROS:   All systems reviewed and negative except as noted in the HPI.   Past Surgical History:  Procedure Laterality Date   CATARACT EXTRACTION W/PHACO Right 01/16/2016   Procedure: CATARACT EXTRACTION PHACO AND INTRAOCULAR LENS PLACEMENT RIGHT EYE CDE= 6.97;  Surgeon: Tonny Branch, MD;  Location: [facility];  Service: Ophthalmology;  Laterality: Right;  right   CATARACT EXTRACTION W/PHACO Left 01/30/2016   Procedure: CATARACT EXTRACTION PHACO AND INTRAOCULAR LENS PLACEMENT LEFT EYE CDE 3.13;  Surgeon: Tonny Branch, MD;  Location: [facility];  Service: Ophthalmology;  Laterality: Left;  left   COLONOSCOPY  2012   COLONOSCOPY N/A 12/04/2016   three 4-6 mm tubular adenomas, moderate size external and internal hemorrhoids, redundant left colon, 3 year surveillance   ESOPHAGOGASTRODUODENOSCOPY N/A 12/04/2016   Dr. Oneida Alar: normal esophagus, small hiatal hernia, chronic gastritis with H.pylori, peptic duodenitis, atypical lymphoid proliferation. Surveillance EGD Feb 2019 without abnormal cells, negative H.pylori   ESOPHAGOGASTRODUODENOSCOPY N/A 03/15/2017   mild gastritis, no atypical cells, negative H.pylori  EXCISION, SKI  08/2016   R THIGH   IR ANGIO INTRA EXTRACRAN SEL COM CAROTID INNOMINATE BILAT MOD SED  11/06/2020   IR ANGIO INTRA EXTRACRAN SEL COM CAROTID INNOMINATE BILAT MOD SED  03/06/2021   IR CT HEAD LTD  11/03/2020   IR INTRAVSC STENT CERV CAROTID W/EMB-PROT MOD SED INCL ANGIO  11/06/2020   IR PERCUTANEOUS ART THROMBECTOMY/INFUSION INTRACRANIAL INC DIAG ANGIO  11/03/2020   IR PTA INTRACRANIAL  11/04/2020   IR RADIOLOGIST EVAL & MGMT  01/23/2021   IR RADIOLOGIST EVAL & MGMT  04/15/2021   IR US GUIDE VASC ACCESS LEFT  11/03/2020    IR US GUIDE VASC ACCESS RIGHT  11/03/2020   IR US GUIDE VASC ACCESS RIGHT  11/06/2020   IR US GUIDE VASC ACCESS RIGHT  03/06/2021   RADIOLOGY WITH ANESTHESIA N/A 11/03/2020   Procedure: RADIOLOGY WITH ANESTHESIA;  Surgeon: Radiologist, Medication, MD;  Location: [facility];  Service: Radiology;  Laterality: N/A;   RADIOLOGY WITH ANESTHESIA N/A 11/06/2020   Procedure: STENTING;  Surgeon: Corrie Mckusick, DO;  Location: [facility];  Service: Anesthesiology;  Laterality: N/A;   RADIOLOGY WITH ANESTHESIA Left 03/06/2021   Procedure: CAROTID STENT;  Surgeon: Corrie Mckusick, DO;  Location: [facility];  Service: Anesthesiology;  Laterality: Left;     Family History  Problem Relation Age of Onset   CAD Mother    High blood pressure Mother    Heart attack Mother    Leukemia Father    Early death Father    Heart attack Sister    Diabetes Sister    Cancer Brother    Deep vein thrombosis Brother    Hypertension Daughter    Stroke Sister    Stroke Sister    Heart attack Sister    Deep vein thrombosis Sister    Hypertension Sister    Diabetes Sister    Arthritis Sister    Arthritis Sister    Arthritis Sister    Hypertension Sister    Heart attack Sister    Cancer Brother        KIDNEY   Kidney disease Brother    Deep vein thrombosis Brother    Seizures Brother    Heart attack Brother    Colon cancer Neg Hx    Colon polyps Neg Hx      Social History   Socioeconomic History   Marital status: Married    Spouse name: Consulting civil engineer   Number of children: 2   Years of education: GED   Highest education level: Not on file  Occupational History   Occupation: disability    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  Tobacco Use   Smoking status: Former    Packs/day: 1.00    Years: 45.00    Total pack years: 45.00    Types: Cigarettes    Quit date: 11/03/2020    Years since quitting: 0.6   Smokeless tobacco: Never   Tobacco comments:    started back - pack per day  Vaping Use   Vaping Use: Never used  Substance  and Sexual Activity   Alcohol use: Not Currently    Comment: none since 11/03/20   Drug use: No   Sexual activity: Yes  Other Topics Concern   Not on file  Social History Narrative   Lives with wife Christopher Lang   Leisure - couch potato   Social Determinants of Health   Financial Resource Strain: Low Risk  (11/14/2020)   Overall Financial Resource Strain (CARDIA)  Difficulty of Paying Living Expenses: Not hard at all  Food Insecurity: No Food Insecurity (11/14/2020)   Hunger Vital Sign    Worried About Running Out of Food in the Last Year: Never true    Ran Out of Food in the Last Year: Never true  Transportation Needs: No Transportation Needs (11/14/2020)   PRAPARE - Hydrologist (Medical): No    Lack of Transportation (Non-Medical): No  Physical Activity: Inactive (11/14/2020)   Exercise Vital Sign    Days of Exercise per Week: 0 days    Minutes of Exercise per Session: 0 min  Stress: No Stress Concern Present (11/14/2020)   [place]    Feeling of Stress : Only a little  Social Connections: Not on file  Intimate Partner Violence: Not At Risk (11/14/2020)   Humiliation, Afraid, Rape, and Kick questionnaire    Fear of Current or Ex-Partner: No    Emotionally Abused: No    Physically Abused: No    Sexually Abused: No     BP 136/70   Pulse 70   Ht 6' (1.829 m)   Wt 183 lb (83 kg)   SpO2 98%   BMI 24.82 kg/m   Physical Exam:  Well appearing NAD HEENT: Unremarkable Neck:  No JVD, no thyromegally Lymphatics:  No adenopathy Back:  No CVA tenderness Lungs:  Clear with no wheezes HEART:  Regular rate rhythm, no murmurs, no rubs, no clicks Abd:  soft, positive bowel sounds, no organomegally, no rebound, no guarding Ext:  2 plus pulses, no edema, no cyanosis, no clubbing Skin:  No rashes no nodules Neuro:  CN II through XII intact, motor grossly intact  EKG - reviewed.  nsr  Assess/Plan:  ICM - he is s/p MI with an EF of 33% despite GDMT. I discussed the indications/risks/benefits/goals/expectations of ICD insertion and he wishes to proceed. Chronic systolic heart failure - his symptoms are class 2. He will continue his current meds.  Christopher Overlie Yeriel Mineo,MD

## 2021-07-16 NOTE — Patient Instructions (Signed)
Medication Instructions:  Your physician recommends that you continue on your current medications as directed. Please refer to the Current Medication list given to you today.  Hold Eliquis and Plavix 2 Days prior to procedure   *If you need a refill on your cardiac medications before your next appointment, please call your pharmacy*   Lab Work: Your physician recommends that you return for lab work in: Today    If you have labs (blood work) drawn today and your tests are completely normal, you will receive your results only by: [place] (if you have MyChart) OR A paper copy in the mail If you have any lab test that is abnormal or we need to change your treatment, we will call you to review the results.   Testing/Procedures: Your physician has recommended that you have a defibrillator inserted. An implantable cardioverter defibrillator (ICD) is a small device that is placed in your chest or, in rare cases, your abdomen. This device uses electrical pulses or shocks to help control life-threatening, irregular heartbeats that could lead the heart to suddenly stop beating (sudden cardiac arrest). Leads are attached to the ICD that goes into your heart. This is done in the hospital and usually requires an overnight stay. Please see the instruction sheet given to you today for more information.    Follow-Up: At [facility], you and your health needs are our priority.  As part of our continuing mission to provide you with exceptional heart care, we have created designated Provider Care Teams.  These Care Teams include your primary Cardiologist (physician) and Advanced Practice Providers (APPs -  Physician Assistants and Nurse Practitioners) who all work together to provide you with the care you need, when you need it.  We recommend signing up for the patient portal called "MyChart".  Sign up information is provided on this After Visit Summary.  MyChart is used to connect with patients for  Virtual Visits (Telemedicine).  Patients are able to view lab/test results, encounter notes, upcoming appointments, etc.  Non-urgent messages can be sent to your provider as well.   To learn more about what you can do with MyChart, go to [email or web address].    Your next appointment:   3 month(s)  The format for your next appointment:   In Person  Provider:   [name], MD    Other Instructions Thank you for choosing [facility]!     Important Information About [place]

## 2021-07-17 ENCOUNTER — Ambulatory Visit: Payer: Medicare Other | Admitting: Neurology

## 2021-07-23 ENCOUNTER — Telehealth: Payer: Self-pay | Admitting: Cardiology

## 2021-07-23 MED ORDER — ATORVASTATIN CALCIUM 40 MG PO TABS: 40.0000 mg | ORAL_TABLET | Freq: Every day | ORAL | 1 refills | Status: AC

## 2021-07-23 MED ORDER — SPIRONOLACTONE 25 MG PO TABS: 25.0000 mg | ORAL_TABLET | Freq: Every day | ORAL | 1 refills | Status: AC

## 2021-07-23 NOTE — Telephone Encounter (Signed)
Done

## 2021-07-23 NOTE — Telephone Encounter (Signed)
*  STAT* If patient is at the pharmacy, call can be transferred to refill team.   1. Which medications need to be refilled? (please list name of each medication and dose if known)   Spironolactone 25 mg  Atorvastatin 40 mg  2. Which pharmacy/location (including street and city if local pharmacy) is medication to be sent to?  [facility]  3. Do they need a 30 day or 90 day supply?   90 day

## 2021-07-31 NOTE — Pre-Procedure Instructions (Signed)
Instructed patient on the following items: Arrival time 1130 Nothing to eat or drink after midnight No meds AM of procedure Responsible person to drive you home and stay with you for 24 hrs Wash with special soap night before and morning of procedure If on anti-coagulant drug instructions Plavix and Eliquis stopped on 7/11

## 2021-08-01 ENCOUNTER — Ambulatory Visit (HOSPITAL_COMMUNITY)
Admission: RE | Admit: 2021-08-01 | Discharge: 2021-08-01 | Disposition: A | Discharge status: Home / Self Care | Payer: Medicare Other | Attending: Internal Medicine | Admitting: Internal Medicine

## 2021-08-01 ENCOUNTER — Ambulatory Visit (HOSPITAL_COMMUNITY)
Admission: RE | Disposition: A | Discharge status: Home / Self Care | Payer: Self-pay | Source: Home / Self Care | Attending: Internal Medicine

## 2021-08-01 ENCOUNTER — Other Ambulatory Visit: Payer: Self-pay

## 2021-08-01 ENCOUNTER — Ambulatory Visit (HOSPITAL_COMMUNITY): Payer: Medicare Other

## 2021-08-01 DIAGNOSIS — J449 Chronic obstructive pulmonary disease, unspecified: Secondary | ICD-10-CM | POA: Diagnosis not present

## 2021-08-01 DIAGNOSIS — Z87891 Personal history of nicotine dependence: Secondary | ICD-10-CM | POA: Insufficient documentation

## 2021-08-01 DIAGNOSIS — I252 Old myocardial infarction: Secondary | ICD-10-CM | POA: Diagnosis not present

## 2021-08-01 DIAGNOSIS — I5022 Chronic systolic (congestive) heart failure: Secondary | ICD-10-CM | POA: Insufficient documentation

## 2021-08-01 DIAGNOSIS — I255 Ischemic cardiomyopathy: Secondary | ICD-10-CM | POA: Diagnosis not present

## 2021-08-01 DIAGNOSIS — J439 Emphysema, unspecified: Secondary | ICD-10-CM | POA: Diagnosis not present

## 2021-08-01 DIAGNOSIS — I6529 Occlusion and stenosis of unspecified carotid artery: Secondary | ICD-10-CM | POA: Diagnosis not present

## 2021-08-01 DIAGNOSIS — I110 Hypertensive heart disease with heart failure: Secondary | ICD-10-CM | POA: Diagnosis not present

## 2021-08-01 HISTORY — PX: ICD IMPLANT: EP1208

## 2021-08-01 SURGERY — ICD IMPLANT

## 2021-08-01 MED ORDER — CEFAZOLIN SODIUM-DEXTROSE 2-4 GM/100ML-% IV SOLN
INTRAVENOUS | Status: AC
  Filled 2021-08-01: qty 100

## 2021-08-01 MED ORDER — LIDOCAINE HCL (PF) 1 % IJ SOLN
INTRAMUSCULAR | Status: DC | PRN
  Administered 2021-08-01: 60 mL

## 2021-08-01 MED ORDER — ACETAMINOPHEN 325 MG PO TABS: 325.0000 mg | ORAL_TABLET | ORAL | Status: DC | PRN

## 2021-08-01 MED ORDER — SODIUM CHLORIDE 0.9 % IV SOLN
80.0000 mg | INTRAVENOUS | Status: AC
  Administered 2021-08-01: 80 mg

## 2021-08-01 MED ORDER — FENTANYL CITRATE (PF) 100 MCG/2ML IJ SOLN
INTRAMUSCULAR | Status: AC
  Filled 2021-08-01: qty 2

## 2021-08-01 MED ORDER — ONDANSETRON HCL 4 MG/2ML IJ SOLN: 4.0000 mg | Freq: Four times a day (QID) | INTRAMUSCULAR | Status: DC | PRN

## 2021-08-01 MED ORDER — MIDAZOLAM HCL 5 MG/5ML IJ SOLN
INTRAMUSCULAR | Status: AC
  Filled 2021-08-01: qty 5

## 2021-08-01 MED ORDER — SODIUM CHLORIDE 0.9 % IV SOLN
INTRAVENOUS | Status: AC
  Filled 2021-08-01: qty 2

## 2021-08-01 MED ORDER — CHLORHEXIDINE GLUCONATE 4 % EX LIQD
4.0000 | Freq: Once | CUTANEOUS | Status: DC
  Filled 2021-08-01: qty 60

## 2021-08-01 MED ORDER — LIDOCAINE HCL 1 % IJ SOLN
INTRAMUSCULAR | Status: AC
  Filled 2021-08-01: qty 60

## 2021-08-01 MED ORDER — POVIDONE-IODINE 10 % EX SWAB
2.0000 | Freq: Once | CUTANEOUS | Status: AC
  Administered 2021-08-01: 2 via TOPICAL

## 2021-08-01 MED ORDER — SODIUM CHLORIDE 0.9 % IV SOLN
INTRAVENOUS | Status: DC | PRN
  Administered 2021-08-01: 80 mg

## 2021-08-01 MED ORDER — FENTANYL CITRATE (PF) 100 MCG/2ML IJ SOLN
INTRAMUSCULAR | Status: DC | PRN
  Administered 2021-08-01: 25 ug via INTRAVENOUS
  Administered 2021-08-01: 12.5 ug via INTRAVENOUS

## 2021-08-01 MED ORDER — MIDAZOLAM HCL 5 MG/5ML IJ SOLN
INTRAMUSCULAR | Status: DC | PRN
  Administered 2021-08-01: 2 mg via INTRAVENOUS
  Administered 2021-08-01: 1 mg via INTRAVENOUS

## 2021-08-01 MED ORDER — HEPARIN (PORCINE) IN NACL 1000-0.9 UT/500ML-% IV SOLN
INTRAVENOUS | Status: DC | PRN
  Administered 2021-08-01: 500 mL

## 2021-08-01 MED ORDER — SODIUM CHLORIDE 0.9 % IV SOLN: INTRAVENOUS | Status: DC

## 2021-08-01 MED ORDER — CEFAZOLIN SODIUM-DEXTROSE 2-4 GM/100ML-% IV SOLN
2.0000 g | Freq: Once | INTRAVENOUS | Status: AC
  Administered 2021-08-01: 2 g via INTRAVENOUS
  Filled 2021-08-01: qty 100

## 2021-08-01 MED ORDER — CEFAZOLIN SODIUM-DEXTROSE 2-4 GM/100ML-% IV SOLN
2.0000 g | INTRAVENOUS | Status: AC
  Administered 2021-08-01: 2 g via INTRAVENOUS

## 2021-08-01 MED ORDER — HEPARIN (PORCINE) IN NACL 1000-0.9 UT/500ML-% IV SOLN
INTRAVENOUS | Status: AC
  Filled 2021-08-01: qty 500

## 2021-08-01 SURGICAL SUPPLY — 9 items
CABLE SURGICAL S-101-97-12 (CABLE) ×2 IMPLANT
ICD EVERA XT MRI DF1  DDMB1D1 (ICD Generator) ×1 IMPLANT
ICD EVERA XT MRI DF1 DDMB1D1 (ICD Generator) IMPLANT
LEAD CAPSURE NOVUS 5076-52CM (Lead) ×1 IMPLANT
LEAD SPRINT QUAT SEC 6935-65CM (Lead) ×1 IMPLANT
PAD DEFIB RADIO PHYSIO CONN (PAD) ×2 IMPLANT
SHEATH 7FR PRELUDE SNAP 13 (SHEATH) ×1 IMPLANT
SHEATH 9FR PRELUDE SNAP 13 (SHEATH) ×1 IMPLANT
TRAY PACEMAKER INSERTION (PACKS) ×2 IMPLANT

## 2021-08-01 NOTE — Discharge Instructions (Signed)
After Your ICD (Implantable Cardiac Defibrillator)   You have a Medtronic ICD  ACTIVITY Do not lift your arm above shoulder height for 1 week after your procedure. After 7 days, you may progress as below.  You should remove your sling 24 hours after your procedure, unless otherwise instructed by your provider.     Friday August 08, 2021  Saturday August 09, 2021 Sunday August 10, 2021 Monday August 11, 2021   Do not lift, push, pull, or carry anything over 10 pounds with the affected arm until 6 weeks (Friday September 12, 2021 ) after your procedure.   You may drive AFTER your wound check, unless you have been told otherwise by your provider.   Ask your healthcare provider when you can go back to work   INCISION/Dressing If you are on a blood thinner such as Coumadin, Xarelto, Eliquis, Plavix, or Pradaxa please confirm with your provider when this should be resumed.   If large square, outer bandage is left in place, this can be removed after 24 hours from your procedure. Do not remove steri-strips or glue as below.   Monitor your defibrillator site for redness, swelling, and drainage. Call the device clinic at [phone] if you experience these symptoms or fever/chills.  If your incision is sealed with Steri-strips or staples, you may shower 7 days after your procedure or when told by your provider. Do not remove the steri-strips or let the shower hit directly on your site. You may wash around your site with soap and water.    If you were discharged in a sling, please do not wear this during the day more than 48 hours after your surgery unless otherwise instructed. This may increase the risk of stiffness and soreness in your shoulder.   Avoid lotions, ointments, or perfumes over your incision until it is well-healed.  You may use a hot tub or a pool AFTER your wound check appointment if the incision is completely closed.  Your ICD is designed to protect you from life threatening heart  rhythms. Because of this, you may receive a shock.   1 shock with no symptoms:  Call the office during business hours. 1 shock with symptoms (chest pain, chest pressure, dizziness, lightheadedness, shortness of breath, overall feeling unwell):  Call 911. If you experience 2 or more shocks in 24 hours:  Call 911. If you receive a shock, you should not drive for 6 months per the [place] DMV IF you receive appropriate therapy from your ICD.   ICD Alerts:  Some alerts are vibratory and others beep. These are NOT emergencies. Please call our office to let us know. If this occurs at night or on weekends, it can wait until the next business day. Send a remote transmission.  If your device is capable of reading fluid status (for heart failure), you will be offered monthly monitoring to review this with you.   DEVICE MANAGEMENT Remote monitoring is used to monitor your ICD from home. This monitoring is scheduled every 91 days by our office. It allows [place] to keep an eye on the functioning of your device to ensure it is working properly. You will routinely see your Electrophysiologist annually (more often if necessary).   You should receive your ID card for your new device in 4-8 weeks. Keep this card with you at all times once received. Consider wearing a medical alert bracelet or necklace.  Your ICD  may be MRI compatible. This will be discussed at your next  office visit/wound check.  You should avoid contact with strong electric or magnetic fields.   Do not use amateur (ham) radio equipment or electric (arc) welding torches. MP3 player headphones with magnets should not be used. Some devices are safe to use if held at least 12 inches (30 cm) from your defibrillator. These include power tools, lawn mowers, and speakers. If you are unsure if something is safe to use, ask your health care provider.  When using your cell phone, hold it to the ear that is on the opposite side from the defibrillator. Do not leave  your cell phone in a pocket over the defibrillator.  You may safely use electric blankets, heating pads, computers, and microwave ovens.  Call the office right away if: You have chest pain. You feel more than one shock. You feel more short of breath than you have felt before. You feel more light-headed than you have felt before. Your incision starts to open up.  This information is not intended to replace advice given to you by your health care provider. Make sure you discuss any questions you have with your health care provider.

## 2021-08-01 NOTE — Progress Notes (Signed)
At [place] I was notified from cental monitor at 1650 pt had <10 sec of "ventricular rhythm-wide QRS complex-no change in rate" event that will be added to chart-Dr Lovena Le notified of the above and of post CXR results-advised to proceed with d/c

## 2021-08-01 NOTE — Interval H&P Note (Signed)
History and Physical Interval Note:  08/01/2021 12:28 PM  Christopher Lang  has presented today for surgery, with the diagnosis of cardiomyopathy.  The various methods of treatment have been discussed with the patient and family. After consideration of risks, benefits and other options for treatment, the patient has consented to  Procedure(s): ICD IMPLANT (N/A) as a surgical intervention.  The patient's history has been reviewed, patient examined, no change in status, stable for surgery.  I have reviewed the patient's chart and labs.  Questions were answered to the patient's satisfaction.     Christopher Lang

## 2021-08-04 ENCOUNTER — Telehealth: Payer: Self-pay

## 2021-08-04 ENCOUNTER — Encounter (HOSPITAL_COMMUNITY): Payer: Self-pay | Admitting: Internal Medicine

## 2021-08-04 MED FILL — Lidocaine HCl Local Inj 1%: INTRAMUSCULAR | Qty: 60 | Status: AC

## 2021-08-04 NOTE — Telephone Encounter (Signed)
-----   Message from Shirley Friar, PA-C sent at 08/01/2021 11:00 AM EDT ----- Same day ICD GT 08/01/2021

## 2021-08-04 NOTE — Telephone Encounter (Addendum)
Follow-up after same day discharge: Implant date: 08/01/21 MD: [name], MD Device: Medtronic ICD Evera XT  Location: Left Chest   Wound check visit: 08/13/21 90 day MD follow-up: 11/04/2021   Remote Transmission received: Remote not received yet, patient was instructed on connecting his remote device and he has connected it at home per industry instructions.   Dressing/sling removed: Yes sling and outer bandage have been removed, steri strips are still in place and patient   Confirm [place] restart on: 08/06/21   Spoke to patient and reviewed discharge instructions and wound care expectations at this point. Reviewed in home connection of his remote device and reminder when to restart his [place].  He was also given direct number to our device clinic for any further questions or concerns and given emergency access information.

## 2021-08-05 ENCOUNTER — Ambulatory Visit: Payer: Self-pay | Admitting: *Deleted

## 2021-08-05 DIAGNOSIS — I10 Essential (primary) hypertension: Secondary | ICD-10-CM | POA: Diagnosis not present

## 2021-08-05 DIAGNOSIS — I5022 Chronic systolic (congestive) heart failure: Secondary | ICD-10-CM | POA: Diagnosis not present

## 2021-08-05 DIAGNOSIS — I480 Paroxysmal atrial fibrillation: Secondary | ICD-10-CM | POA: Diagnosis not present

## 2021-08-05 DIAGNOSIS — G4739 Other sleep apnea: Secondary | ICD-10-CM | POA: Diagnosis not present

## 2021-08-05 DIAGNOSIS — Z0000 Encounter for general adult medical examination without abnormal findings: Secondary | ICD-10-CM | POA: Diagnosis not present

## 2021-08-05 DIAGNOSIS — E782 Mixed hyperlipidemia: Secondary | ICD-10-CM | POA: Diagnosis not present

## 2021-08-05 DIAGNOSIS — Z6824 Body mass index (BMI) 24.0-24.9, adult: Secondary | ICD-10-CM | POA: Diagnosis not present

## 2021-08-05 NOTE — Patient Outreach (Signed)
  Care Coordination   Follow Up Visit Note   08/05/2021 Name: BRITAIN SABER MRN: [id number] DOB: 06-Sep-1955  ROMUALDO PROSISE is a 66 y.o. year old male who sees Evans Lance, MD for primary care. I spoke with  [name] by phone today  What matters to the patients health and wellness today?  ICD placed on 9/02, no complications   Goals Addressed               This Visit's Progress     Recover from ICD placement (pt-stated)        Care Coordination Interventions: Basic overview and discussion of pathophysiology of Heart Failure reviewed Provided education on low sodium diet Discussed the importance of keeping all appointments with provider ICD wound check on 7/26, cardiology 8/10, neurology 9/7.         SDOH assessments and interventions completed:   Yes   Care Coordination Interventions Activated:  Yes Care Coordination Interventions:   Yes, provided  Follow up plan: Follow up call scheduled for within 2 months  Encounter Outcome:  Pt. Visit Completed  [name], RN, MSN, [facility] Manager [phone]

## 2021-08-06 ENCOUNTER — Telehealth: Payer: Self-pay | Admitting: Cardiology

## 2021-08-06 NOTE — Telephone Encounter (Signed)
Spoke to pt who stated he received a letter in the mail authorizing an imaging study from February 2023.

## 2021-08-06 NOTE — Telephone Encounter (Signed)
Pt would like a callback regarding a letter he received in the mail today about a 3D imaging test. Please advise

## 2021-08-13 ENCOUNTER — Ambulatory Visit (INDEPENDENT_AMBULATORY_CARE_PROVIDER_SITE_OTHER): Payer: Medicare Other

## 2021-08-13 DIAGNOSIS — I255 Ischemic cardiomyopathy: Secondary | ICD-10-CM

## 2021-08-13 LAB — CUP PACEART INCLINIC DEVICE CHECK
Battery Remaining Longevity: 123 mo
Battery Voltage: 3.12 V
Brady Statistic AP VP Percent: 0.04 %
Brady Statistic AP VS Percent: 44.93 %
Brady Statistic AS VP Percent: 0.01 %
Brady Statistic AS VS Percent: 55.01 %
Brady Statistic RA Percent Paced: 44.3 %
Brady Statistic RV Percent Paced: 0.05 %
Date Time Interrogation Session: 20230726085200
HighPow Impedance: 60 Ohm
Implantable Lead Implant Date: 20230714
Implantable Lead Implant Date: 20230714
Implantable Lead Location: 753859
Implantable Lead Location: 753860
Implantable Lead Model: 5076
Implantable Pulse Generator Implant Date: 20230714
Lead Channel Impedance Value: 342 Ohm
Lead Channel Impedance Value: 418 Ohm
Lead Channel Impedance Value: 475 Ohm
Lead Channel Pacing Threshold Amplitude: 0.5 V
Lead Channel Pacing Threshold Amplitude: 0.5 V
Lead Channel Pacing Threshold Pulse Width: 0.4 ms
Lead Channel Pacing Threshold Pulse Width: 0.4 ms
Lead Channel Sensing Intrinsic Amplitude: 11.625 mV
Lead Channel Sensing Intrinsic Amplitude: 12.625 mV
Lead Channel Sensing Intrinsic Amplitude: 3.625 mV
Lead Channel Sensing Intrinsic Amplitude: 4.375 mV
Lead Channel Setting Pacing Amplitude: 3.5 V
Lead Channel Setting Pacing Amplitude: 3.5 V
Lead Channel Setting Pacing Pulse Width: 0.4 ms
Lead Channel Setting Sensing Sensitivity: 0.3 mV

## 2021-08-13 NOTE — Patient Instructions (Addendum)
    After Your ICD (Implantable Cardiac Defibrillator)    Monitor your defibrillator site for redness, swelling, and drainage. Call the device clinic at [phone] if you experience these symptoms or fever/chills.  Your incision was closed with Steri-strips or staples:  You may shower 7 days after your procedure and wash your incision with soap and water. Avoid lotions, ointments, or perfumes over your incision until it is well-healed. UNTIL AFTER AUGUST 25TH.  You may use a hot tub or a pool after your wound check appointment if the incision is completely closed.  Do not lift, push or pull greater than 10 pounds with the affected arm until 6 weeks after your procedure. There are no other restrictions in arm movement after your wound check appointment.  Your ICD is MRI compatible.  Your ICD is designed to protect you from life threatening heart rhythms. Because of this, you may receive a shock.   1 shock with no symptoms:  Call the office during business hours. 1 shock with symptoms (chest pain, chest pressure, dizziness, lightheadedness, shortness of breath, overall feeling unwell):  Call 911. If you experience 2 or more shocks in 24 hours:  Call 911. If you receive a shock, you should not drive.  [place] DMV - no driving for 6 months if you receive appropriate therapy from your ICD.   ICD Alerts:  Some alerts are vibratory and others beep. These are NOT emergencies. Please call our office to let us know. If this occurs at night or on weekends, it can wait until the next business day. Send a remote transmission.  If your device is capable of reading fluid status (for heart failure), you will be offered monthly monitoring to review this with you.   Remote monitoring is used to monitor your ICD from home. This monitoring is scheduled every 91 days by our office. It allows [place] to keep an eye on the functioning of your device to ensure it is working properly. You will routinely see your  Electrophysiologist annually (more often if necessary).

## 2021-08-13 NOTE — Progress Notes (Signed)

## 2021-08-14 DIAGNOSIS — Z136 Encounter for screening for cardiovascular disorders: Secondary | ICD-10-CM | POA: Diagnosis not present

## 2021-08-14 DIAGNOSIS — Z87891 Personal history of nicotine dependence: Secondary | ICD-10-CM | POA: Diagnosis not present

## 2021-08-14 DIAGNOSIS — F17210 Nicotine dependence, cigarettes, uncomplicated: Secondary | ICD-10-CM | POA: Diagnosis not present

## 2021-08-27 NOTE — Progress Notes (Unsigned)
Cardiology Office Note  Date: 08/27/2021   ID: Terrion, Christopher Lang 03-Nov-1955, MRN [id number]  PCP:  Evans Lance, MD  Cardiologist:  Rozann Lesches, MD Electrophysiologist:  None   No chief complaint on file.   History of Present Illness: Christopher Lang is a 66 y.o. male last seen in May.  He is status post placement of a [employer] ICD by Dr. Lovena Le in July.  Past Medical History:  Diagnosis Date   Acute CVA (cerebrovascular accident) (Christopher Lang) 11/03/2020   Acute left-sided weakness 11/03/2020   Acute ST elevation myocardial infarction (STEMI) ([place]) 11/03/2020   a. s/p anterior STEMI in 10/2020 but not a candidate for invasive cath at that time given his acute CVA and having received TNK   Carotid artery disease [facility])    Cataract    Removed Dec 2017/Jan 2018   CHF (congestive heart failure) ([place])    a. EF 20-25% by echo in 10/2020, similar results by repeat echo in 01/2021   COPD (chronic obstructive pulmonary disease) ([place])    Coronary artery disease    Dyspnea    Essential hypertension    H. pylori infection 11/2016   With documented eradication via biopsy Feb 2019   Heart murmur    History of atrial fibrillation    November 2017   Hyperlipidemia    Peripheral vascular disease ([place])    Sleep apnea    Did not tolerate CPAP   Tubulovillous adenoma of rectum 04/2010    Past Surgical History:  Procedure Laterality Date   CATARACT EXTRACTION W/PHACO Right 01/16/2016   Procedure: CATARACT EXTRACTION PHACO AND INTRAOCULAR LENS PLACEMENT RIGHT EYE CDE= 6.97;  Surgeon: Tonny Branch, MD;  Location: [facility];  Service: Ophthalmology;  Laterality: Right;  right   CATARACT EXTRACTION W/PHACO Left 01/30/2016   Procedure: CATARACT EXTRACTION PHACO AND INTRAOCULAR LENS PLACEMENT LEFT EYE CDE 3.13;  Surgeon: Tonny Branch, MD;  Location: [facility];  Service: Ophthalmology;  Laterality: Left;  left   COLONOSCOPY  2012   COLONOSCOPY N/A 12/04/2016   three 4-6 mm tubular adenomas,  moderate size external and internal hemorrhoids, redundant left colon, 3 year surveillance   ESOPHAGOGASTRODUODENOSCOPY N/A 12/04/2016   Dr. Oneida Alar: normal esophagus, small hiatal hernia, chronic gastritis with H.pylori, peptic duodenitis, atypical lymphoid proliferation. Surveillance EGD Feb 2019 without abnormal cells, negative H.pylori   ESOPHAGOGASTRODUODENOSCOPY N/A 03/15/2017   mild gastritis, no atypical cells, negative H.pylori   EXCISION, SKI  08/2016   R THIGH   ICD IMPLANT N/A 08/01/2021   Procedure: ICD IMPLANT;  Surgeon: Evans Lance, MD;  Location: [place] [facility];  Service: Cardiovascular;  Laterality: N/A;   IR ANGIO INTRA EXTRACRAN SEL COM CAROTID INNOMINATE BILAT MOD SED  11/06/2020   IR ANGIO INTRA EXTRACRAN SEL COM CAROTID INNOMINATE BILAT MOD SED  03/06/2021   IR CT HEAD LTD  11/03/2020   IR INTRAVSC STENT CERV CAROTID W/EMB-PROT MOD SED INCL ANGIO  11/06/2020   IR PERCUTANEOUS ART THROMBECTOMY/INFUSION INTRACRANIAL INC DIAG ANGIO  11/03/2020   IR PTA INTRACRANIAL  11/04/2020   IR RADIOLOGIST EVAL & MGMT  01/23/2021   IR RADIOLOGIST EVAL & MGMT  04/15/2021   IR US GUIDE VASC ACCESS LEFT  11/03/2020   IR US GUIDE VASC ACCESS RIGHT  11/03/2020   IR US GUIDE VASC ACCESS RIGHT  11/06/2020   IR US GUIDE VASC ACCESS RIGHT  03/06/2021   RADIOLOGY WITH ANESTHESIA N/A 11/03/2020   Procedure: RADIOLOGY WITH ANESTHESIA;  Surgeon: Radiologist, Medication, MD;  Location: [facility];  Service: Radiology;  Laterality: N/A;   RADIOLOGY WITH ANESTHESIA N/A 11/06/2020   Procedure: STENTING;  Surgeon: Corrie Mckusick, DO;  Location: [place];  Service: Anesthesiology;  Laterality: N/A;   RADIOLOGY WITH ANESTHESIA Left 03/06/2021   Procedure: CAROTID STENT;  Surgeon: Corrie Mckusick, DO;  Location: [place];  Service: Anesthesiology;  Laterality: Left;    Current Outpatient Medications  Medication Sig Dispense Refill   apixaban (ELIQUIS) 5 MG TABS tablet Take 1 tablet (5 mg total) by mouth 2 (two)  times daily. 180 tablet 3   atorvastatin (LIPITOR) 40 MG tablet Take 1 tablet (40 mg total) by mouth daily. (Patient taking differently: Take 40 mg by mouth daily at 6 PM.) 90 tablet 1   clopidogrel (PLAVIX) 75 MG tablet Take 1 tablet (75 mg total) by mouth daily. 60 tablet 0   dapagliflozin propanediol (FARXIGA) 10 MG TABS tablet Take 1 tablet (10 mg total) by mouth daily before breakfast. 90 tablet 3   metoprolol succinate (TOPROL-XL) 25 MG 24 hr tablet TAKE 1 TABLET BY MOUTH DAILY 90 tablet 2   sacubitril-valsartan (ENTRESTO) 97-103 MG Take 1 tablet by mouth 2 (two) times daily. 60 tablet 11   spironolactone (ALDACTONE) 25 MG tablet Take 1 tablet (25 mg total) by mouth daily. 90 tablet 1   No current facility-administered medications for this visit.   Allergies:  Patient has no known allergies.   Social History: The patient  reports that he quit smoking about 9 months ago. His smoking use included cigarettes. He has a 45.00 pack-year smoking history. He has never used smokeless tobacco. He reports that he does not currently use alcohol. He reports that he does not use drugs.   Family History: The patient's family history includes Arthritis in his sister, sister, and sister; CAD in his mother; Cancer in his brother and brother; Deep vein thrombosis in his brother, brother, and sister; Diabetes in his sister and sister; Early death in his father; Heart attack in his brother, mother, sister, sister, and sister; High blood pressure in his mother; Hypertension in his daughter, sister, and sister; Kidney disease in his brother; Leukemia in his father; Seizures in his brother; Stroke in his sister and sister.   ROS:  Please see the history of present illness. Otherwise, complete review of systems is positive for {NONE DEFAULTED:18576}.  All other systems are reviewed and negative.   Physical Exam: VS:  There were no vitals taken for this visit., BMI There is no height or weight on file to calculate  BMI.  Wt Readings from Last 3 Encounters:  08/01/21 183 lb (83 kg)  07/16/21 183 lb (83 kg)  05/28/21 184 lb 6.4 oz (83.6 kg)    General: Patient appears comfortable at rest. HEENT: Conjunctiva and lids normal, oropharynx clear with moist mucosa. Neck: Supple, no elevated JVP or carotid bruits, no thyromegaly. Lungs: Clear to auscultation, nonlabored breathing at rest. Cardiac: Regular rate and rhythm, no S3 or significant systolic murmur, no pericardial rub. Abdomen: Soft, nontender, no hepatomegaly, bowel sounds present, no guarding or rebound. Extremities: No pitting edema, distal pulses 2+. Skin: Warm and dry. Musculoskeletal: No kyphosis. Neuropsychiatric: Alert and oriented x3, affect grossly appropriate.  ECG:  An ECG dated 08/01/2021 was personally reviewed today and demonstrated:  Atrial paced rhythm with old anterior infarct pattern and nonspecific ST-T changes.  Recent Labwork: 12/02/2020: Magnesium 1.8 04/04/2021: ALT 19; AST 23 07/16/2021: BUN 11; Creatinine, Ser 0.76; Hemoglobin  14.7; Platelets 180; Potassium 3.6; Sodium 140     Component Value Date/Time   CHOL 108 04/04/2021 0957   CHOL 160 06/23/2018 1445   TRIG 52 04/04/2021 0957   HDL 39 (L) 04/04/2021 0957   HDL 29 (L) 06/23/2018 1445   CHOLHDL 2.8 04/04/2021 0957   VLDL 10 04/04/2021 0957   LDLCALC 59 04/04/2021 0957   LDLCALC 77 08/15/2019 1002    Other Studies Reviewed Today:  Cardiac MRI 04/25/2021: IMPRESSION: 1. Subendocardial late gadolinium enhancement consistent with prior infarct in mid anterior/anteroseptal, apical anterior/septal walls and apex. LGE is greater than 50% transmural suggesting this region is nonviable. There is also a focal area of subendocardial LGE in basal inferoseptum suggesting small infarct in this region as well   2. Mild LV dilatation with moderate systolic dysfunction (EF 99%). Thinning/akinesis of mid anterior/anteroseptal, apical anterior/septal walls and apex.   3.   Normal RV size and systolic function (EF 37%)   4. Mass in left lower lobe of lung measuring 8m x 148m hyperintense on HASTE and LGE images. 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   Moderate paraseptal emphysema.   Extensive multi-vessel coronary artery calcification.   Peripheral vascular disease with 50% stenosis of the left common carotid artery origin and 50-75% stenosis of the left subclavian artery origin.   Aortic Atherosclerosis (ICD10-I70.0) and Emphysema (ICD10-J43.9).  Assessment and Plan:    Medication Adjustments/Labs and Tests Ordered: Current medicines are reviewed at length with the patient today.  Concerns regarding medicines are outlined above.   Tests Ordered: No orders of the defined types were placed in this encounter.   Medication Changes: No orders of the defined types were placed in this encounter.   Disposition:  Follow up {follow up:15908}  Signed, SaSatira SarkMD, FAPacific Rim Out[facility] Center/09/2021 2:23 PM    [place]errv[place] EdPlain View[place] [id number]: [phone]: [phone]

## 2021-08-28 ENCOUNTER — Encounter: Payer: Self-pay | Admitting: Cardiology

## 2021-08-28 ENCOUNTER — Ambulatory Visit: Payer: Medicare Other | Admitting: Cardiology

## 2021-08-28 VITALS — BP 122/88 | HR 67 | Ht 72.0 in | Wt 184.2 lb

## 2021-08-28 DIAGNOSIS — I5020 Unspecified systolic (congestive) heart failure: Secondary | ICD-10-CM | POA: Diagnosis not present

## 2021-08-28 DIAGNOSIS — I480 Paroxysmal atrial fibrillation: Secondary | ICD-10-CM

## 2021-08-28 DIAGNOSIS — I6523 Occlusion and stenosis of bilateral carotid arteries: Secondary | ICD-10-CM | POA: Diagnosis not present

## 2021-08-28 DIAGNOSIS — I25119 Atherosclerotic heart disease of native coronary artery with unspecified angina pectoris: Secondary | ICD-10-CM

## 2021-08-28 NOTE — Patient Instructions (Addendum)

## 2021-09-23 ENCOUNTER — Ambulatory Visit: Payer: Medicare Other | Admitting: *Deleted

## 2021-09-25 ENCOUNTER — Encounter: Payer: Self-pay | Admitting: Neurology

## 2021-09-25 ENCOUNTER — Ambulatory Visit: Payer: Medicare Other | Admitting: Neurology

## 2021-09-25 VITALS — BP 138/82 | HR 75 | Ht 72.0 in | Wt 189.2 lb

## 2021-09-25 DIAGNOSIS — Z8673 Personal history of transient ischemic attack (TIA), and cerebral infarction without residual deficits: Secondary | ICD-10-CM

## 2021-09-25 DIAGNOSIS — I6522 Occlusion and stenosis of left carotid artery: Secondary | ICD-10-CM

## 2021-09-25 NOTE — Patient Instructions (Signed)
I had a long d/w patient about his recent stroke, carotid stent and stenosis, atrial fibrillatrion,risk for recurrent stroke/TIAs, personally independently reviewed imaging studies and stroke evaluation results and answered questions.Continue Eliquis (apixaban) daily  for secondary stroke prevention for his atrial fibrillation and Plavix for his carotid stent and maintain strict control of hypertension with blood pressure goal below 130/90, diabetes with hemoglobin A1c goal below 6.5% and lipids with LDL cholesterol goal below 70 mg/dL. I also advised the patient to eat a healthy diet with plenty of whole grains, cereals, fruits and vegetables, exercise regularly and maintain ideal body weight .check screening carotid ultrasound and if moderate left carotid stenosis has progressed may need carotid revascularization.  Followup in the future with me in 1 year or call earlier if necessary.  Carotid Artery Disease  Carotid artery disease, also called carotid artery stenosis, is the narrowing or blockage of one or both carotid arteries. The carotid arteries are the two main blood vessels on either side of the neck. They supply blood to the brain, other parts of the head, and the neck. Carotid artery disease increases your risk for a stroke or a transient ischemic attack (TIA). A TIA is a "mini-stroke" that causes stroke-like symptoms that then go away quickly. What are the causes? This condition is mainly caused by a narrowing and hardening of the carotid arteries (atherosclerosis). The carotid arteries can become narrow or clogged with a buildup of fat, cholesterol, calcium, and other substances (plaque). What increases the risk? The following factors may make you more likely to develop this condition: Having certain medical conditions, such as: High cholesterol. High blood pressure (hypertension). Diabetes. Obesity. Smoking. A family history of cardiovascular disease. Inactivity or lack of regular  exercise. Being male. Men have an increased risk of developing atherosclerosis earlier in life than women. Old age. What are the signs or symptoms? This condition may not have any signs or symptoms until a stroke or TIA occurs. In some cases, your health care provider may be able to hear a whooshing sound (bruit). This can indicate a change in blood flow caused by plaque buildup. An eye exam can also help identify signs of the condition. How is this diagnosed? This condition may be diagnosed with a physical exam, your medical history, and your family's medical history. You may also have tests that look at the blood flow in your carotid arteries, such as: Carotid artery ultrasound, which uses sound waves to create pictures to show if the arteries are narrow or blocked. Tests that use a dye injected into a vein to highlight your arteries on images, such as: Carotid or cerebral angiography, which uses X-rays. Computerized tomographic angiography (CTA), which uses CT scans. Magnetic resonance angiography (MRA), which uses MRI. How is this treated? This condition may be treated with a combination of treatments. Treatment options include: Lifestyle changes, such as: Quitting smoking. Exercising regularly or as told by your health care provider. Eating a heart-healthy diet. Managing stress. Maintaining a healthy weight. Medicines to control blood pressure, cholesterol, and blood clotting. Surgery. You may have: A carotid endarterectomy. This is a surgery to remove the blockages in the carotid arteries. A carotid angioplasty with stenting. This is a procedure in which a small mesh tube (stent) is used to widen the blocked carotid arteries. Follow these instructions at home: Eating and drinking Follow instructions about your diet from your health care provider. It is important to: Eat a healthy diet that is low in saturated fats and includes  plenty of fresh fruits, vegetables, and lean  meats. Avoid foods that are high in fat and salt (sodium). Avoid foods that are fried, overly processed, or have poor nutritional value.  Lifestyle  Maintain a healthy weight. Do exercises as told by your health care provider to stay physically active. It is recommended that each week you get at least 150 minutes of moderate-intensity exercise or 75 minutes of exercise that takes a lot of effort. Do not use any products that contain nicotine or tobacco, such as cigarettes, e-cigarettes, and chewing tobacco. If you need help quitting, ask your health care provider. Do not drink alcohol if: Your health care provider tells you not to drink. You are pregnant, may be pregnant, or are planning to become pregnant. If you drink alcohol: Limit how much you use to: 0-1 drink a day for women. 0-2 drinks a day for men. Be aware of how much alcohol is in your drink. In the [place] one drink equals one 12 oz bottle of beer (355 mL), one 5 oz glass of wine (148 mL), or one 1 oz glass of hard liquor (44 mL). Do not use drugs. Manage your stress. Ask your health care provider for stress management tips. General instructions Take over-the-counter and prescription medicines only as told by your health care provider. Keep all follow-up visits as told by your health care provider. This is important. Where to find more information American Heart Association: www.heart.org Get help right away if: You have any symptoms of a stroke. "BE FAST" is an easy way to remember the main warning signs of a stroke: B - Balance. Signs are dizziness, sudden trouble walking, or loss of balance. E - Eyes. Signs are trouble seeing or a sudden change in vision. F - Face. Signs are sudden weakness or numbness of the face, or the face or eyelid drooping on one side. A - Arms. Signs are weakness or numbness in an arm. This happens suddenly and usually on one side of the body. S - Speech. Signs are sudden trouble speaking, slurred  speech, or trouble understanding what people say. T - Time. Time to call emergency services. Write down what time symptoms started. You have other signs of a stroke, such as: A sudden, severe headache with no known cause. Nausea or vomiting. Seizure. These symptoms may represent a serious problem that is an emergency. Do not wait to see if the symptoms will go away. Get medical help right away. Call your local emergency services (911 in the [place] Do not drive yourself to the hospital. Summary Carotid artery disease, also called carotid artery stenosis, is the narrowing or blockage of one or both carotid arteries. Carotid artery disease increases your risk for a stroke or a transient ischemic attack (TIA). This condition can be treated with lifestyle changes, medicines, surgery, or a combination of these treatments. Get help right away if you have any symptoms of stroke. The acronym BEFAST is an easy way to remember the main warning signs of stroke. This information is not intended to replace advice given to you by your health care provider. Make sure you discuss any questions you have with your health care provider. Document Revised: 04/03/2021 Document Reviewed: 07/18/2018 Elsevier Patient Education  [place].

## 2021-09-25 NOTE — Progress Notes (Signed)
[facility] [address] [place]. [place] [address] [id number]       OFFICE CONSULT NOTE  [name] Date of Birth:  1955/01/27 Medical Record Number:  [id number]   Referring MD: Rosalin Hawking  Reason for Referral: Stroke  HPI: Christopher Lang is a 66 year old African-American male seen today for initial office consultation visit for stroke.  History is obtained from the patient, review of electronic medical records and personally reviewed pertinent available imaging films in PACS.  He has past medical history significant for hyperlipidemia, sleep apnea, coronary artery disease, COPD, atrial fibrillation who presented on 11/03/2020 with chest pain and feeling flushed by the time EMS were called by the wife and they arrived they noted she had left sided weakness EKGs done by EMS suggested anterior STEMI he was brought in as a code STEMI in the ED code stroke was activated and he was found to have significant left hemiplegia.  CT scan of the head showed a hyperdense right M1 1 sign and CT angiogram of the head and neck confirmed right M1 occlusion with severe bilateral ICA origin stenosis with underfilling of the left carotid with moderate to severe stenosis of bilateral carotid siphons and 60% left subclavian origin stenosis.  Patient was given IV TNKase followed by successful mechanical thrombectomy and  TICI 3 revascularization of the right MCA by Dr. Earleen Newport.  He is kept in the ICU with close monitoring and made steady improvement.  MRI scan showed patchy acute infarcts on the right more than the left with trace petechial hemorrhage.  Carotid ultrasound showed 80 to 90% right ICA stenosis and left ICA stenosis difficult to measure due to severe/narrowing.  2D echo showed ejection fraction of 20 to 25% with entire apex being a kinetic.  Myocardium appeared thin.  LDL cholesterol was 77 mg percent hemoglobin A1c 5.0.  Patient a few days later subsequent underwent elective right  carotid artery angioplasty and stenting by Dr. Earleen Newport which was uneventful.  Started on Eliquis for his A. fib and cardiomyopathy and as well as Plavix for his stent.  Patient states is done well since discharge.  He is having some minor bruising but no bleeding complications.  Is tolerating Lipitor well without muscle aches and pains.  He does get some cramps at night but these are not bothersome.  He has followed up with cardiologist who has not increase the dose of Entresto recently and did some follow-up labs and has scheduled an echocardiogram next month.  Patient is able to walk independently without shortness of breath.  Difficulty walking is mostly related to one leg being shorter than the other.  He is currently just finished his physical occupational therapy.  He has some diminished fine motor skills on the left and has trouble buttoning his shirts.  He has not yet followed up with Dr. Earleen Newport to consider left carotid artery revascularization.  He feels he is able to do most activities of daily living: Himself except he is walking with a cane and has some diminished fine motor skills.  ROS:   14 system review of systems is positive for weakness, leg cramps, muscle aches, difficulty walking, thigh pain, leg pain all other systems negative  PMH:  Past Medical History:  Diagnosis Date   Acute CVA (cerebrovascular accident) ([place]) 11/03/2020   Acute left-sided weakness 11/03/2020   Acute ST elevation myocardial infarction (STEMI) ([place]) 11/03/2020   a. s/p anterior STEMI in 10/2020 but not a candidate for invasive cath  at that time given his acute CVA and having received TNK   Carotid artery disease [facility])    Cataract    Removed Dec 2017/Jan 2018   CHF (congestive heart failure) ([place])    a. EF 20-25% by echo in 10/2020, similar results by repeat echo in 01/2021   COPD (chronic obstructive pulmonary disease) ([place])    Coronary artery disease    Dyspnea    Essential hypertension    H. pylori  infection 11/2016   With documented eradication via biopsy Feb 2019   Heart murmur    History of atrial fibrillation    November 2017   Hyperlipidemia    Peripheral vascular disease ([place])    Sleep apnea    Did not tolerate CPAP   Tubulovillous adenoma of rectum 04/2010    Social History:  Social History   Socioeconomic History   Marital status: Married    Spouse name: Consulting civil engineer   Number of children: 2   Years of education: GED   Highest education level: Not on file  Occupational History   Occupation: disability    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  Tobacco Use   Smoking status: Former    Packs/day: 1.00    Years: 45.00    Total pack years: 45.00    Types: Cigarettes    Quit date: 11/03/2020    Years since quitting: 0.8   Smokeless tobacco: Never   Tobacco comments:    started back - pack per day  Vaping Use   Vaping Use: Never used  Substance and Sexual Activity   Alcohol use: Not Currently    Comment: none since 11/03/20   Drug use: No   Sexual activity: Yes  Other Topics Concern   Not on file  Social History Narrative   Lives with wife [name]   Leisure - couch potato   Social Determinants of Health   Financial Resource Strain: Low Risk  (11/14/2020)   Overall Financial Resource Strain (CARDIA)    Difficulty of Paying Living Expenses: Not hard at all  Food Insecurity: No Food Insecurity (11/14/2020)   Hunger Vital Sign    Worried About Running Out of Food in the Last Year: Never true    Ran Out of Food in the Last Year: Never true  Transportation Needs: No Transportation Needs (11/14/2020)   PRAPARE - Hydrologist (Medical): No    Lack of Transportation (Non-Medical): No  Physical Activity: Inactive (11/14/2020)   Exercise Vital Sign    Days of Exercise per Week: 0 days    Minutes of Exercise per Session: 0 min  Stress: No Stress Concern Present (11/14/2020)   [place]    Feeling of Stress : Only a little  Social Connections: Not on file  Intimate Partner Violence: Not At Risk (11/14/2020)   Humiliation, Afraid, Rape, and Kick questionnaire    Fear of Current or Ex-Partner: No    Emotionally Abused: No    Physically Abused: No    Sexually Abused: No    Medications:   Current Outpatient Medications on File Prior to Visit  Medication Sig Dispense Refill   apixaban (ELIQUIS) 5 MG TABS tablet Take 1 tablet (5 mg total) by mouth 2 (two) times daily. 180 tablet 3   atorvastatin (LIPITOR) 40 MG tablet Take 1 tablet (40 mg total) by mouth daily. (Patient taking differently: Take 40 mg by mouth daily at 6 PM.) 90 tablet  1   clopidogrel (PLAVIX) 75 MG tablet Take 1 tablet (75 mg total) by mouth daily. 60 tablet 0   dapagliflozin propanediol (FARXIGA) 10 MG TABS tablet Take 1 tablet (10 mg total) by mouth daily before breakfast. 90 tablet 3   ipratropium (ATROVENT) 0.06 % nasal spray Place 1 spray into both nostrils as needed.     metoprolol succinate (TOPROL-XL) 25 MG 24 hr tablet TAKE 1 TABLET BY MOUTH DAILY 90 tablet 2   sacubitril-valsartan (ENTRESTO) 97-103 MG Take 1 tablet by mouth 2 (two) times daily. 60 tablet 11   spironolactone (ALDACTONE) 25 MG tablet Take 1 tablet (25 mg total) by mouth daily. 90 tablet 1   No current facility-administered medications on file prior to visit.    Allergies:  No Known Allergies  Physical Exam General: Mildly obese  middle-aged African-American male, seated, in no evident distress Head: head normocephalic and atraumatic.   Neck: supple with no carotid or supraclavicular bruits Cardiovascular: regular rate and rhythm, no murmurs Musculoskeletal: no deformity Skin:  no rash/petichiae Vascular:  Normal pulses all extremities  Neurologic Exam Mental Status: Awake and fully alert. Oriented to place and time. Recent and remote memory intact. Attention span, concentration and fund of knowledge appropriate.  Mood and affect appropriate.  Cranial Nerves: Fundoscopic exam reveals sharp disc margins. Pupils equal, briskly reactive to light. Extraocular movements full without nystagmus. Visual fields full to confrontation. Hearing intact. Facial sensation intact.  Mild left lower facial asymmetry., tongue, palate moves normally and symmetrically.  Motor: Normal bulk and tone. Normal strength in all tested extremity muscles. Sensory.: intact to touch , pinprick , position and vibratory sensation.  Coordination: Rapid alternating movements normal in all extremities. Finger-to-nose and heel-to-shin performed accurately bilaterally. Gait and Station: Arises from chair without difficulty. Stance is normal. Gait demonstrates normal stride length and balance but favors left leg as it is shorter than the right.. Able to heel, toe and tandem walk with moderate difficulty.  Reflexes: 1+ and symmetric. Toes downgoing.   NIHSS  1 Modified Rankin  2   ASSESSMENT: 66 year old African-American male with right MCA infarct secondary to right M1 occlusion due to embolization from high-grade proximal carotid stenosis as well as acute myocardial infarction treated with IV TNK followed by successful mechanical thrombectomy with TICI 3 revascularization.  Subsequent elective right proximal carotid angioplasty stenting done a few days later.  Vascular risk factors of coronary artery disease hypertension, hyperlipidemia, smoking, ischemic cardiomyopathy     PLANI had a long d/w patient about his recent stroke, carotid stent and stenosis, atrial fibrillatrion,risk for recurrent stroke/TIAs, personally independently reviewed imaging studies and stroke evaluation results and answered questions.Continue Eliquis (apixaban) daily  for secondary stroke prevention for his atrial fibrillation and Plavix for his carotid stent and maintain strict control of hypertension with blood pressure goal below 130/90, diabetes with hemoglobin A1c goal  below 6.5% and lipids with LDL cholesterol goal below 70 mg/dL. I also advised the patient to eat a healthy diet with plenty of whole grains, cereals, fruits and vegetables, exercise regularly and maintain ideal body weight .check screening carotid ultrasound and if moderate left carotid stenosis has progressed may need carotid revascularization.  Followup in the future with me in 1 year or call earlier if necessary.Greater than 50% time during this 45-minute consultation visit were spent on counseling and coordination of care about his recent stroke and carotid stenosis and revascularization and answering questions Antony Contras, MD  Note: This document was prepared with digital  dictation and possible smart phrase technology. Any transcriptional errors that result from this process are unintentional.

## 2021-09-29 ENCOUNTER — Encounter: Payer: Self-pay | Admitting: *Deleted

## 2021-09-29 ENCOUNTER — Ambulatory Visit: Payer: Self-pay | Admitting: *Deleted

## 2021-09-29 NOTE — Patient Instructions (Signed)
Visit Information  Thank you for taking time to visit with me today. Please don't hesitate to contact me if I can be of assistance to you before our next scheduled telephone appointment.  Following are the goals we discussed today:  Continue daily weights. Schedule Annual Wellness visit wit pcp  Our next appointment is by telephone on 11/8  Please call the care guide team at [phone] if you need to cancel or reschedule your appointment.   Please call the Suicide and Crisis Lifeline: 988 call the [place] National Suicide Prevention Lifeline: 432 197 8464 or TTY: (256)761-8380 TTY 307 471 0599) to talk to a trained counselor call 1-800-273-TALK (toll free, 24 hour hotline) call the [facility]: [phone] if you are experiencing a [place] or [place] or need someone to talk to.  The patient verbalized understanding of instructions, educational materials, and care plan provided today and agreed to receive a mailed copy of patient instructions, educational materials, and care plan.   The patient has been provided with contact information for the care management team and has been advised to call with any health related questions or concerns.   [name], RN, MSN, [place] Care Management Care Management Coordinator [phone]

## 2021-09-29 NOTE — Patient Outreach (Signed)
  Care Coordination   Initial Visit Note   09/29/2021 Name: Christopher Lang MRN: [id number] DOB: Aug 19, 1955  Christopher Lang is a 66 y.o. year old male who sees Hasanaj, Samul Dada, MD for primary care. I spoke with  Christopher Lang by phone today.  What matters to the patients health and wellness today?  Report he has recovered well from the ICD placement.  He is now back to his baseline, walking daily.  Denies any shortness of breath or chest discomfort.  State he has already received his flu and pneumonia vaccinations.  Monitoring daily weights, remained stable. Denies any urgent concerns, encouraged to contact this care manager with questions.     Goals Addressed               This Visit's Progress     COMPLETED: Recover from ICD placement (pt-stated)   On track     Care Coordination Interventions: Basic overview and discussion of pathophysiology of Heart Failure reviewed Provided education on low sodium diet Discussed the importance of keeping all appointments with provider ICD wound check on 7/26, cardiology 8/10, neurology 9/7. Reminded of need for AWV with PCP office.   Reviewed upcoming follow up with cardiology on 10/17          SDOH assessments and interventions completed:  Yes  SDOH Interventions Today    Flowsheet Row Most Recent Value  SDOH Interventions   Food Insecurity Interventions Intervention Not Indicated  Housing Interventions Intervention Not Indicated  Transportation Interventions Intervention Not Indicated  Utilities Interventions Intervention Not Indicated        Care Coordination Interventions Activated:  Yes  Care Coordination Interventions:  Yes, provided   Follow up plan: Follow up call scheduled for 10/8    Encounter Outcome:  Pt. Visit Completed   [name], RN, MSN, [place] Care Management Care Management Coordinator [phone]

## 2021-10-03 ENCOUNTER — Other Ambulatory Visit: Payer: Self-pay | Admitting: Student

## 2021-10-10 ENCOUNTER — Ambulatory Visit (HOSPITAL_COMMUNITY)
Admission: RE | Admit: 2021-10-10 | Discharge: 2021-10-10 | Disposition: A | Discharge status: Home / Self Care | Payer: Medicare Other | Source: Ambulatory Visit | Attending: Neurology | Admitting: Neurology

## 2021-10-10 DIAGNOSIS — I6522 Occlusion and stenosis of left carotid artery: Secondary | ICD-10-CM | POA: Diagnosis not present

## 2021-10-11 NOTE — Progress Notes (Signed)
Kindly inform the patient that carotid ultrasound study shows patent appearance of the right carotid stent without significant restenosis.  There is moderate narrowing of the left carotid artery but this can be managed medically at the present time.  He will need repeat carotid ultrasound in around 6 months

## 2021-10-13 ENCOUNTER — Telehealth: Payer: Self-pay

## 2021-10-13 NOTE — Telephone Encounter (Signed)
I called patient. I discussed his carotid US results. He will call us in 6 months to discuss the repeat US carotid. Pt verbalized understanding of results. Pt had no questions at this time but was encouraged to call back if questions arise.

## 2021-10-13 NOTE — Telephone Encounter (Signed)
-----   Message from Garvin Fila, MD sent at 10/11/2021  3:07 PM EDT ----- Christopher Lang inform the patient that carotid ultrasound study shows patent appearance of the right carotid stent without significant restenosis.  There is moderate narrowing of the left carotid artery but this can be managed medically at the present time.  He will need repeat carotid ultrasound in around 6 months

## 2021-11-04 ENCOUNTER — Ambulatory Visit: Payer: Medicare Other | Attending: Internal Medicine | Admitting: Internal Medicine

## 2021-11-04 ENCOUNTER — Ambulatory Visit (INDEPENDENT_AMBULATORY_CARE_PROVIDER_SITE_OTHER): Payer: Medicare Other

## 2021-11-04 ENCOUNTER — Encounter: Payer: Self-pay | Admitting: Internal Medicine

## 2021-11-04 VITALS — BP 146/80 | HR 74 | Ht 72.0 in | Wt 193.0 lb

## 2021-11-04 DIAGNOSIS — I255 Ischemic cardiomyopathy: Secondary | ICD-10-CM

## 2021-11-04 LAB — CUP PACEART INCLINIC DEVICE CHECK
Battery Remaining Longevity: 118 mo
Battery Voltage: 3.08 V
Brady Statistic AP VP Percent: 0.06 %
Brady Statistic AP VS Percent: 65.62 %
Brady Statistic AS VP Percent: 0.01 %
Brady Statistic AS VS Percent: 34.31 %
Brady Statistic RA Percent Paced: 64.67 %
Brady Statistic RV Percent Paced: 0.07 %
Date Time Interrogation Session: 20231017081853
HighPow Impedance: 63 Ohm
Implantable Lead Implant Date: 20230714
Implantable Lead Implant Date: 20230714
Implantable Lead Location: 753859
Implantable Lead Location: 753860
Implantable Lead Model: 5076
Implantable Pulse Generator Implant Date: 20230714
Lead Channel Impedance Value: 342 Ohm
Lead Channel Impedance Value: 418 Ohm
Lead Channel Impedance Value: 418 Ohm
Lead Channel Pacing Threshold Amplitude: 0.375 V
Lead Channel Pacing Threshold Amplitude: 0.5 V
Lead Channel Pacing Threshold Pulse Width: 0.4 ms
Lead Channel Pacing Threshold Pulse Width: 0.4 ms
Lead Channel Sensing Intrinsic Amplitude: 14.625 mV
Lead Channel Sensing Intrinsic Amplitude: 16 mV
Lead Channel Sensing Intrinsic Amplitude: 4.5 mV
Lead Channel Sensing Intrinsic Amplitude: 5.875 mV
Lead Channel Setting Pacing Amplitude: 2 V
Lead Channel Setting Pacing Amplitude: 2.5 V
Lead Channel Setting Pacing Pulse Width: 0.4 ms
Lead Channel Setting Sensing Sensitivity: 0.3 mV

## 2021-11-04 LAB — CUP PACEART REMOTE DEVICE CHECK
Battery Remaining Longevity: 115 mo
Battery Voltage: 3.08 V
Brady Statistic AP VP Percent: 0.06 %
Brady Statistic AP VS Percent: 65.71 %
Brady Statistic AS VP Percent: 0.02 %
Brady Statistic AS VS Percent: 34.21 %
Brady Statistic RA Percent Paced: 64.76 %
Brady Statistic RV Percent Paced: 0.07 %
Date Time Interrogation Session: 20231017033523
HighPow Impedance: 62 Ohm
Implantable Lead Implant Date: 20230714
Implantable Lead Implant Date: 20230714
Implantable Lead Location: 753859
Implantable Lead Location: 753860
Implantable Lead Model: 5076
Implantable Pulse Generator Implant Date: 20230714
Lead Channel Impedance Value: 342 Ohm
Lead Channel Impedance Value: 418 Ohm
Lead Channel Impedance Value: 418 Ohm
Lead Channel Pacing Threshold Amplitude: 0.375 V
Lead Channel Pacing Threshold Amplitude: 0.5 V
Lead Channel Pacing Threshold Pulse Width: 0.4 ms
Lead Channel Pacing Threshold Pulse Width: 0.4 ms
Lead Channel Sensing Intrinsic Amplitude: 14.625 mV
Lead Channel Sensing Intrinsic Amplitude: 14.625 mV
Lead Channel Sensing Intrinsic Amplitude: 4.5 mV
Lead Channel Sensing Intrinsic Amplitude: 4.5 mV
Lead Channel Setting Pacing Amplitude: 3.5 V
Lead Channel Setting Pacing Amplitude: 3.5 V
Lead Channel Setting Pacing Pulse Width: 0.4 ms
Lead Channel Setting Sensing Sensitivity: 0.3 mV

## 2021-11-04 NOTE — Progress Notes (Signed)
HPI [name] returns today for ongoing evaluation s/p ICD insertion for primary prevention of malignant ventricular arrhythmias. He is a pleasant 66 yo man who sustained an anterior MI 12 months ago. He had been placed on GDMT and has class 2 symptoms. He has an EF by CMRI of 33%. He has not had syncope. He also has known carotid disease and COPD. No palpitations. He underwent insertion of a medtronic DDD ICD in July. Since then he notes that he has felt well with no chest pain or sob and minimal incisional discomfort. No Known Allergies   Current Outpatient Medications  Medication Sig Dispense Refill   apixaban (ELIQUIS) 5 MG TABS tablet Take 1 tablet (5 mg total) by mouth 2 (two) times daily. 180 tablet 3   atorvastatin (LIPITOR) 40 MG tablet Take 1 tablet (40 mg total) by mouth daily. (Patient taking differently: Take 40 mg by mouth daily at 6 PM.) 90 tablet 1   clopidogrel (PLAVIX) 75 MG tablet Take 1 tablet (75 mg total) by mouth daily. 60 tablet 0   dapagliflozin propanediol (FARXIGA) 10 MG TABS tablet Take 1 tablet (10 mg total) by mouth daily before breakfast. 90 tablet 3   ipratropium (ATROVENT) 0.06 % nasal spray Place 1 spray into both nostrils as needed.     metoprolol succinate (TOPROL-XL) 25 MG 24 hr tablet TAKE 1 TABLET (25 MG TOTAL) BY MOUTH DAILY. 90 tablet 1   sacubitril-valsartan (ENTRESTO) 97-103 MG Take 1 tablet by mouth 2 (two) times daily. 60 tablet 11   spironolactone (ALDACTONE) 25 MG tablet Take 1 tablet (25 mg total) by mouth daily. 90 tablet 1   No current facility-administered medications for this visit.     Past Medical History:  Diagnosis Date   Acute CVA (cerebrovascular accident) ([place]) 11/03/2020   Acute left-sided weakness 11/03/2020   Acute ST elevation myocardial infarction (STEMI) ([place]) 11/03/2020   a. s/p anterior STEMI in 10/2020 but not a candidate for invasive cath at that time given his acute CVA and having received TNK   Carotid artery  disease [facility])    Cataract    Removed Dec 2017/Jan 2018   CHF (congestive heart failure) ([place])    a. EF 20-25% by echo in 10/2020, similar results by repeat echo in 01/2021   COPD (chronic obstructive pulmonary disease) ([place])    Coronary artery disease    Dyspnea    Essential hypertension    H. pylori infection 11/2016   With documented eradication via biopsy Feb 2019   Heart murmur    History of atrial fibrillation    November 2017   Hyperlipidemia    Peripheral vascular disease ([place])    Sleep apnea    Did not tolerate CPAP   Tubulovillous adenoma of rectum 04/2010    ROS:   All systems reviewed and negative except as noted in the HPI.   Past Surgical History:  Procedure Laterality Date   CATARACT EXTRACTION W/PHACO Right 01/16/2016   Procedure: CATARACT EXTRACTION PHACO AND INTRAOCULAR LENS PLACEMENT RIGHT EYE CDE= 6.97;  Surgeon: Tonny Branch, MD;  Location: [facility];  Service: Ophthalmology;  Laterality: Right;  right   CATARACT EXTRACTION W/PHACO Left 01/30/2016   Procedure: CATARACT EXTRACTION PHACO AND INTRAOCULAR LENS PLACEMENT LEFT EYE CDE 3.13;  Surgeon: Tonny Branch, MD;  Location: [facility];  Service: Ophthalmology;  Laterality: Left;  left   COLONOSCOPY  2012   COLONOSCOPY N/A 12/04/2016   three 4-6 mm tubular adenomas,  moderate size external and internal hemorrhoids, redundant left colon, 3 year surveillance   ESOPHAGOGASTRODUODENOSCOPY N/A 12/04/2016   Dr. Oneida Alar: normal esophagus, small hiatal hernia, chronic gastritis with H.pylori, peptic duodenitis, atypical lymphoid proliferation. Surveillance EGD Feb 2019 without abnormal cells, negative H.pylori   ESOPHAGOGASTRODUODENOSCOPY N/A 03/15/2017   mild gastritis, no atypical cells, negative H.pylori   EXCISION, SKI  08/2016   R THIGH   ICD IMPLANT N/A 08/01/2021   Procedure: ICD IMPLANT;  Surgeon: Evans Lance, MD;  Location: [facility];  Service: Cardiovascular;  Laterality: N/A;   IR ANGIO INTRA EXTRACRAN SEL  COM CAROTID INNOMINATE BILAT MOD SED  11/06/2020   IR ANGIO INTRA EXTRACRAN SEL COM CAROTID INNOMINATE BILAT MOD SED  03/06/2021   IR CT HEAD LTD  11/03/2020   IR INTRAVSC STENT CERV CAROTID W/EMB-PROT MOD SED INCL ANGIO  11/06/2020   IR PERCUTANEOUS ART THROMBECTOMY/INFUSION INTRACRANIAL INC DIAG ANGIO  11/03/2020   IR PTA INTRACRANIAL  11/04/2020   IR RADIOLOGIST EVAL & MGMT  01/23/2021   IR RADIOLOGIST EVAL & MGMT  04/15/2021   IR US GUIDE VASC ACCESS LEFT  11/03/2020   IR US GUIDE VASC ACCESS RIGHT  11/03/2020   IR US GUIDE VASC ACCESS RIGHT  11/06/2020   IR US GUIDE VASC ACCESS RIGHT  03/06/2021   RADIOLOGY WITH ANESTHESIA N/A 11/03/2020   Procedure: RADIOLOGY WITH ANESTHESIA;  Surgeon: Radiologist, Medication, MD;  Location: [facility];  Service: Radiology;  Laterality: N/A;   RADIOLOGY WITH ANESTHESIA N/A 11/06/2020   Procedure: STENTING;  Surgeon: Corrie Mckusick, DO;  Location: [place];  Service: Anesthesiology;  Laterality: N/A;   RADIOLOGY WITH ANESTHESIA Left 03/06/2021   Procedure: CAROTID STENT;  Surgeon: Corrie Mckusick, DO;  Location: [facility];  Service: Anesthesiology;  Laterality: Left;     Family History  Problem Relation Age of Onset   CAD Mother    High blood pressure Mother    Heart attack Mother    Leukemia Father    Early death Father    Heart attack Sister    Diabetes Sister    Cancer Brother    Deep vein thrombosis Brother    Hypertension Daughter    Stroke Sister    Stroke Sister    Heart attack Sister    Deep vein thrombosis Sister    Hypertension Sister    Diabetes Sister    Arthritis Sister    Arthritis Sister    Arthritis Sister    Hypertension Sister    Heart attack Sister    Cancer Brother        KIDNEY   Kidney disease Brother    Deep vein thrombosis Brother    Seizures Brother    Heart attack Brother    Colon cancer Neg Hx    Colon polyps Neg Hx      Social History   Socioeconomic History   Marital status: Married    Spouse name: [name]    Number of children: 2   Years of education: GED   Highest education level: Not on file  Occupational History   Occupation: disability    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  Tobacco Use   Smoking status: Former    Packs/day: 1.00    Years: 45.00    Total pack years: 45.00    Types: Cigarettes    Quit date: 11/03/2020    Years since quitting: 1.0   Smokeless tobacco: Never   Tobacco comments:    started back -  pack per day  Vaping Use   Vaping Use: Never used  Substance and Sexual Activity   Alcohol use: Not Currently    Comment: none since 11/03/20   Drug use: No   Sexual activity: Yes  Other Topics Concern   Not on file  Social History Narrative   Lives with wife [name]   Leisure - couch potato   Social Determinants of Health   Financial Resource Strain: Low Risk  (11/14/2020)   Overall Financial Resource Strain (CARDIA)    Difficulty of Paying Living Expenses: Not hard at all  Food Insecurity: No Food Insecurity (09/29/2021)   Hunger Vital Sign    Worried About Running Out of Food in the Last Year: Never true    Ran Out of Food in the Last Year: Never true  Transportation Needs: No Transportation Needs (09/29/2021)   PRAPARE - Hydrologist (Medical): No    Lack of Transportation (Non-Medical): No  Physical Activity: Inactive (11/14/2020)   Exercise Vital Sign    Days of Exercise per Week: 0 days    Minutes of Exercise per Session: 0 min  Stress: No Stress Concern Present (11/14/2020)   [place]    Feeling of Stress : Only a little  Social Connections: Not on file  Intimate Partner Violence: Not At Risk (11/14/2020)   Humiliation, Afraid, Rape, and Kick questionnaire    Fear of Current or Ex-Partner: No    Emotionally Abused: No    Physically Abused: No    Sexually Abused: No     BP (!) 146/80   Pulse 74   Ht 6' (1.829 m)   Wt 193 lb (87.5 kg)   SpO2 99%   BMI 26.18  kg/m   Physical Exam:  Well appearing NAD HEENT: Unremarkable Neck:  No JVD, no thyromegally Lymphatics:  No adenopathy Back:  No CVA tenderness Lungs:  Clear HEART:  Regular rate rhythm, no murmurs, no rubs, no clicks Abd:  soft, positive bowel sounds, no organomegally, no rebound, no guarding Ext:  2 plus pulses, no edema, no cyanosis, no clubbing Skin:  No rashes no nodules Neuro:  CN II through XII intact, motor grossly intact  EKG  DEVICE  Normal device function.  See PaceArt for details.   Assess/Plan:   ICM - he is s/p MI with an EF of 33% despite GDMT. He is s/p ICD insertion. No anginal symptoms. Chronic systolic heart failure - his symptoms are class 2. He will continue his current meds. Tobacco abuse - he is in remission.  4. Sinus node dysfunction - he is pacing 66% of the time. He is asymptomatic. 5. ICD - his DDD Medtronic ICD is working normally and has over 9 years of battery longevity.  Carleene Overlie Steffanie Mingle,MD

## 2021-11-04 NOTE — Patient Instructions (Signed)
Medication Instructions:  Your physician recommends that you continue on your current medications as directed. Please refer to the Current Medication list given to you today.  *If you need a refill on your cardiac medications before your next appointment, please call your pharmacy*   Lab Work: NONE   If you have labs (blood work) drawn today and your tests are completely normal, you will receive your results only by: MyChart Message (if you have MyChart) OR A paper copy in the mail If you have any lab test that is abnormal or we need to change your treatment, we will call you to review the results.   Testing/Procedures: NONE    Follow-Up: At [facility], you and your health needs are our priority.  As part of our continuing mission to provide you with exceptional heart care, we have created designated Provider Care Teams.  These Care Teams include your primary Cardiologist (physician) and Advanced Practice Providers (APPs -  Physician Assistants and Nurse Practitioners) who all work together to provide you with the care you need, when you need it.  We recommend signing up for the patient portal called "MyChart".  Sign up information is provided on this After Visit Summary.  MyChart is used to connect with patients for Virtual Visits (Telemedicine).  Patients are able to view lab/test results, encounter notes, upcoming appointments, etc.  Non-urgent messages can be sent to your provider as well.   To learn more about what you can do with MyChart, go to [email or web address].    Your next appointment:   1 year(s)  The format for your next appointment:   In Person  Provider:   Gregg Taylor, MD    Other Instructions Thank you for choosing [facility]!    Important Information About Sugar       

## 2021-11-11 DIAGNOSIS — G4739 Other sleep apnea: Secondary | ICD-10-CM | POA: Diagnosis not present

## 2021-11-11 DIAGNOSIS — I10 Essential (primary) hypertension: Secondary | ICD-10-CM | POA: Diagnosis not present

## 2021-11-11 DIAGNOSIS — E782 Mixed hyperlipidemia: Secondary | ICD-10-CM | POA: Diagnosis not present

## 2021-11-11 DIAGNOSIS — I480 Paroxysmal atrial fibrillation: Secondary | ICD-10-CM | POA: Diagnosis not present

## 2021-11-11 DIAGNOSIS — I5022 Chronic systolic (congestive) heart failure: Secondary | ICD-10-CM | POA: Diagnosis not present

## 2021-11-19 NOTE — Progress Notes (Signed)
Remote ICD transmission.   

## 2021-11-26 DIAGNOSIS — G4739 Other sleep apnea: Secondary | ICD-10-CM | POA: Diagnosis not present

## 2021-11-26 DIAGNOSIS — I5022 Chronic systolic (congestive) heart failure: Secondary | ICD-10-CM | POA: Diagnosis not present

## 2021-11-26 DIAGNOSIS — I480 Paroxysmal atrial fibrillation: Secondary | ICD-10-CM | POA: Diagnosis not present

## 2021-11-26 DIAGNOSIS — E782 Mixed hyperlipidemia: Secondary | ICD-10-CM | POA: Diagnosis not present

## 2021-11-26 DIAGNOSIS — I10 Essential (primary) hypertension: Secondary | ICD-10-CM | POA: Diagnosis not present

## 2022-02-03 ENCOUNTER — Ambulatory Visit: Payer: 59 | Attending: Internal Medicine

## 2022-02-03 DIAGNOSIS — I255 Ischemic cardiomyopathy: Secondary | ICD-10-CM

## 2022-02-03 LAB — CUP PACEART REMOTE DEVICE CHECK
Battery Remaining Longevity: 116 mo
Battery Voltage: 3.05 V
Brady Statistic AP VP Percent: 0.05 %
Brady Statistic AP VS Percent: 51.06 %
Brady Statistic AS VP Percent: 0.02 %
Brady Statistic AS VS Percent: 48.87 %
Brady Statistic RA Percent Paced: 50.34 %
Brady Statistic RV Percent Paced: 0.07 %
Date Time Interrogation Session: 20240116033423
HighPow Impedance: 59 Ohm
Implantable Lead Connection Status: 753985
Implantable Lead Connection Status: 753985
Implantable Lead Implant Date: 20230714
Implantable Lead Implant Date: 20230714
Implantable Lead Location: 753859
Implantable Lead Location: 753860
Implantable Lead Model: 5076
Implantable Pulse Generator Implant Date: 20230714
Lead Channel Impedance Value: 342 Ohm
Lead Channel Impedance Value: 399 Ohm
Lead Channel Impedance Value: 399 Ohm
Lead Channel Pacing Threshold Amplitude: 0.375 V
Lead Channel Pacing Threshold Amplitude: 0.625 V
Lead Channel Pacing Threshold Pulse Width: 0.4 ms
Lead Channel Pacing Threshold Pulse Width: 0.4 ms
Lead Channel Sensing Intrinsic Amplitude: 14.375 mV
Lead Channel Sensing Intrinsic Amplitude: 14.375 mV
Lead Channel Sensing Intrinsic Amplitude: 4.125 mV
Lead Channel Sensing Intrinsic Amplitude: 4.125 mV
Lead Channel Setting Pacing Amplitude: 2 V
Lead Channel Setting Pacing Amplitude: 2.5 V
Lead Channel Setting Pacing Pulse Width: 0.4 ms
Lead Channel Setting Sensing Sensitivity: 0.3 mV
Zone Setting Status: 755011

## 2022-02-17 DIAGNOSIS — M8589 Other specified disorders of bone density and structure, multiple sites: Secondary | ICD-10-CM | POA: Diagnosis not present

## 2022-02-17 DIAGNOSIS — M810 Age-related osteoporosis without current pathological fracture: Secondary | ICD-10-CM | POA: Diagnosis not present

## 2022-02-19 DIAGNOSIS — E782 Mixed hyperlipidemia: Secondary | ICD-10-CM | POA: Diagnosis not present

## 2022-02-19 DIAGNOSIS — I480 Paroxysmal atrial fibrillation: Secondary | ICD-10-CM | POA: Diagnosis not present

## 2022-02-19 DIAGNOSIS — Z0000 Encounter for general adult medical examination without abnormal findings: Secondary | ICD-10-CM | POA: Diagnosis not present

## 2022-02-19 DIAGNOSIS — I5022 Chronic systolic (congestive) heart failure: Secondary | ICD-10-CM | POA: Diagnosis not present

## 2022-02-19 DIAGNOSIS — G4739 Other sleep apnea: Secondary | ICD-10-CM | POA: Diagnosis not present

## 2022-02-19 DIAGNOSIS — I10 Essential (primary) hypertension: Secondary | ICD-10-CM | POA: Diagnosis not present

## 2022-02-27 NOTE — Progress Notes (Signed)
Remote ICD transmission.   

## 2022-03-17 NOTE — Progress Notes (Unsigned)
Cardiology Office Note  Date: 03/18/2022   ID: Melford, Sawhney 06-07-1955, MRN [id number]  History of Present Illness: Christopher Lang is a 67 y.o. male last seen in August 2023.  He is here for a routine visit.  Reports NYHA class II dyspnea with typical activities, no angina, no palpitations or syncope.  He does not report any leg swelling, no orthopnea or PND.  Medtronic ICD in place with follow-up by Dr. Lovena Le.  I reviewed his device interrogation report from January indicating normal function, he has not had any discharges.  We went over his medications today as well, he does not report any intolerances.  No spontaneous bleeding problems on Plavix and Eliquis.  He has not had recent lab work.  We discussed getting an updated echocardiogram as well to reassess LVEF.  He continues to follow with [facility] and also neurology in [place].  Physical Exam: VS:  BP (!) 148/78   Pulse 73   Ht 6' (1.829 m)   Wt 200 lb 6.4 oz (90.9 kg)   SpO2 97%   BMI 27.18 kg/m , BMI Body mass index is 27.18 kg/m.  Wt Readings from Last 3 Encounters:  03/18/22 200 lb 6.4 oz (90.9 kg)  11/04/21 193 lb (87.5 kg)  09/25/21 189 lb 3.2 oz (85.8 kg)    General: Patient appears comfortable at rest. HEENT: Conjunctiva and lids normal. Neck: Supple, no elevated JVP, stable soft carotid bruits. Lungs: Clear to auscultation, nonlabored breathing at rest. Cardiac: Regular rate and rhythm, no S3 or significant systolic murmur. Extremities: No pitting edema.  ECG:  An ECG dated 08/01/2021 was personally reviewed today and demonstrated:  Atrial paced rhythm with old anterior infarct pattern and nonspecific ST-T changes.  Labwork: 04/04/2021: ALT 19; AST 23 07/16/2021: BUN 11; Creatinine, Ser 0.76; Hemoglobin 14.7; Platelets 180; Potassium 3.6; Sodium 140     Component Value Date/Time   CHOL 108 04/04/2021 0957   CHOL 160 06/23/2018 1445   TRIG 52 04/04/2021 0957   HDL 39 (L) 04/04/2021 0957   HDL 29  (L) 06/23/2018 1445   CHOLHDL 2.8 04/04/2021 0957   VLDL 10 04/04/2021 0957   LDLCALC 59 04/04/2021 0957   LDLCALC 77 08/15/2019 1002   Other Studies Reviewed Today:  Carotid Dopplers 10/10/2021: Summary:  Right Carotid: The ECA appears >50% stenosed. Patent right ICA stent.   Left Carotid: Velocities in the left ICA are consistent with a 60-79%  stenosis.               Non-hemodynamically significant plaque <50% noted in the  CCA. The                ECA appears >50% stenosed. Loss of end diastolic signal in  the               common carotid artery. Sonographic evidence of possible  string               sign; however, heavy calcification obstructs insonation of  the               proximal ICA just beyond the bulb. Velocities obtained may                underestimate degree of stenosis.   Vertebrals:  Right vertebral artery demonstrates antegrade flow. Left  vertebral              artery demonstrates turbulent flow.  Subclavians: Normal flow hemodynamics were seen  in bilateral subclavian               arteries.   Assessment and Plan:  1.  HFrEF with ischemic cardiomyopathy, LVEF 33% by cardiac MRI in April 2023 with nonviable inferior infarct scar.  He reports stable NYHA class II symptoms, weight is up some but he reports no leg swelling, no orthopnea or PND.  Plan to update echocardiogram.  Otherwise continue Toprol-XL, Farxiga, Entresto, and Aldactone.  Check BMET.  2.  Paroxysmal atrial fibrillation with CHA2DS2-VASc score of 6.  He continues on Eliquis for stroke prophylaxis.  No reported palpitations.  Hemoglobin and creatinine were normal in June 2023.  He does not report any spontaneous bleeding problems.  3.  Bilateral carotid artery disease status post right ICA stent intervention.  Carotid Dopplers from September 2023 revealed patent RICA stent and 60 to [id number] LICA stenosis.  He continues on Plavix and has had follow-up with [facility]VS and neurology.  4.  CAD with history of  previous anterior wall infarct and October 2022 managed medically (invasive testing not pursued at that point since he had received TNK in the setting of stroke).  No active angina at this time.  He remains on Lipitor.  5.  Mixed hyperlipidemia, last LDL 59 in March 2023.  Continues on Lipitor 40 mg daily.  6.  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  Disposition:  Follow up  6 months.  Signed, Satira Sark, M.D., F.A.C.C.

## 2022-03-18 ENCOUNTER — Encounter: Payer: Self-pay | Admitting: Cardiology

## 2022-03-18 ENCOUNTER — Ambulatory Visit: Payer: 59 | Attending: Cardiology | Admitting: Cardiology

## 2022-03-18 VITALS — BP 148/78 | HR 73 | Ht 72.0 in | Wt 200.4 lb

## 2022-03-18 DIAGNOSIS — I25119 Atherosclerotic heart disease of native coronary artery with unspecified angina pectoris: Secondary | ICD-10-CM | POA: Diagnosis not present

## 2022-03-18 DIAGNOSIS — I5020 Unspecified systolic (congestive) heart failure: Secondary | ICD-10-CM | POA: Diagnosis not present

## 2022-03-18 DIAGNOSIS — I480 Paroxysmal atrial fibrillation: Secondary | ICD-10-CM

## 2022-03-18 DIAGNOSIS — Z79899 Other long term (current) drug therapy: Secondary | ICD-10-CM | POA: Diagnosis not present

## 2022-03-18 DIAGNOSIS — E782 Mixed hyperlipidemia: Secondary | ICD-10-CM | POA: Diagnosis not present

## 2022-03-18 NOTE — Patient Instructions (Addendum)
Medication Instructions:  Your physician recommends that you continue on your current medications as directed. Please refer to the Current Medication list given to you today.  Labwork: BMET today at [facility]   Testing/Procedures: Your physician has requested that you have an echocardiogram. Echocardiography is a painless test that uses sound waves to create images of your heart. It provides your doctor with information about the size and shape of your heart and how well your heart's chambers and valves are working. This procedure takes approximately one hour. There are no restrictions for this procedure. Please do NOT wear cologne, perfume, aftershave, or lotions (deodorant is allowed). Please arrive 15 minutes prior to your appointment time.  Follow-Up: Your physician recommends that you schedule a follow-up appointment in: 6 months  Any Other Special Instructions Will Be Listed Below (If Applicable).  If you need a refill on your cardiac medications before your next appointment, please call your pharmacy.

## 2022-03-19 LAB — BASIC METABOLIC PANEL
BUN/Creatinine Ratio: 15 (ref 10–24)
BUN: 13 mg/dL (ref 8–27)
CO2: 24 mmol/L (ref 20–29)
Calcium: 10.7 mg/dL — ABNORMAL HIGH (ref 8.6–10.2)
Chloride: 104 mmol/L (ref 96–106)
Creatinine, Ser: 0.88 mg/dL (ref 0.76–1.27)
Glucose: 92 mg/dL (ref 70–99)
Potassium: 3.8 mmol/L (ref 3.5–5.2)
Sodium: 143 mmol/L (ref 134–144)
eGFR: 93 mL/min/{1.73_m2} (ref >59)

## 2022-03-23 DIAGNOSIS — M79674 Pain in right toe(s): Secondary | ICD-10-CM | POA: Diagnosis not present

## 2022-03-23 DIAGNOSIS — L110 Acquired keratosis follicularis: Secondary | ICD-10-CM | POA: Diagnosis not present

## 2022-03-23 DIAGNOSIS — M79675 Pain in left toe(s): Secondary | ICD-10-CM | POA: Diagnosis not present

## 2022-03-23 DIAGNOSIS — M79672 Pain in left foot: Secondary | ICD-10-CM | POA: Diagnosis not present

## 2022-03-23 DIAGNOSIS — M79671 Pain in right foot: Secondary | ICD-10-CM | POA: Diagnosis not present

## 2022-03-23 DIAGNOSIS — I739 Peripheral vascular disease, unspecified: Secondary | ICD-10-CM | POA: Diagnosis not present

## 2022-03-27 ENCOUNTER — Other Ambulatory Visit: Payer: Self-pay | Admitting: Cardiology

## 2022-03-27 NOTE — Telephone Encounter (Signed)
Pt last saw Dr Domenic Polite 03/18/22, last labs 03/18/22 Creat 0.88, age 67, weight 90.9kg, based on specified criteria pt is on appropriate dosage of Eliquis '5mg'$  BID for afib.  Will refill rx.

## 2022-03-29 ENCOUNTER — Other Ambulatory Visit: Payer: Self-pay | Admitting: Cardiology

## 2022-04-08 ENCOUNTER — Ambulatory Visit: Payer: 59 | Attending: Cardiology

## 2022-04-08 DIAGNOSIS — I5020 Unspecified systolic (congestive) heart failure: Secondary | ICD-10-CM | POA: Diagnosis not present

## 2022-04-08 LAB — ECHOCARDIOGRAM COMPLETE
AR max vel: 1.93 cm2
AV Area VTI: 2.02 cm2
AV Area mean vel: 1.93 cm2
AV Mean grad: 5 mmHg
AV Peak grad: 8.1 mmHg
Ao pk vel: 1.42 m/s
Area-P 1/2: 3.46 cm2
Calc EF: 40.8 %
MV M vel: 4.71 m/s
MV Peak grad: 88.7 mmHg
S' Lateral: 3.3 cm
Single Plane A2C EF: 41.9 %
Single Plane A4C EF: 38.9 %

## 2022-04-08 MED ORDER — PERFLUTREN LIPID MICROSPHERE
1.0000 mL | INTRAVENOUS | Status: AC | PRN
  Administered 2022-04-08: 2 mL via INTRAVENOUS

## 2022-04-20 ENCOUNTER — Other Ambulatory Visit: Payer: Self-pay | Admitting: Cardiology

## 2022-04-28 ENCOUNTER — Telehealth: Payer: Self-pay | Admitting: Cardiology

## 2022-04-28 MED ORDER — ENTRESTO 97-103 MG PO TABS: 1.0000 | ORAL_TABLET | Freq: Two times a day (BID) | ORAL | 1 refills | Status: DC

## 2022-04-28 NOTE — Telephone Encounter (Signed)
Done

## 2022-04-28 NOTE — Telephone Encounter (Signed)
*  STAT* If patient is at the pharmacy, call can be transferred to refill team.   1. Which medications need to be refilled? (please list name of each medication and dose if known) ENTRESTO 97-103 MG [[id number]]   2. Which pharmacy/location (including street and city if local pharmacy) is medication to be sent to [facility]  3. Do they need a 30 day or 90 day supply?  90 day supply

## 2022-05-05 ENCOUNTER — Ambulatory Visit (INDEPENDENT_AMBULATORY_CARE_PROVIDER_SITE_OTHER): Payer: 59

## 2022-05-05 DIAGNOSIS — I255 Ischemic cardiomyopathy: Secondary | ICD-10-CM | POA: Diagnosis not present

## 2022-05-06 LAB — CUP PACEART REMOTE DEVICE CHECK
Battery Remaining Longevity: 115 mo
Battery Voltage: 3.03 V
Brady Statistic AP VP Percent: 0.04 %
Brady Statistic AP VS Percent: 50.02 %
Brady Statistic AS VP Percent: 0.01 %
Brady Statistic AS VS Percent: 49.92 %
Brady Statistic RA Percent Paced: 49.44 %
Brady Statistic RV Percent Paced: 0.05 %
Date Time Interrogation Session: 20240416043723
HighPow Impedance: 60 Ohm
Implantable Lead Connection Status: 753985
Implantable Lead Connection Status: 753985
Implantable Lead Implant Date: 20230714
Implantable Lead Implant Date: 20230714
Implantable Lead Location: 753859
Implantable Lead Location: 753860
Implantable Lead Model: 5076
Implantable Pulse Generator Implant Date: 20230714
Lead Channel Impedance Value: 304 Ohm
Lead Channel Impedance Value: 361 Ohm
Lead Channel Impedance Value: 399 Ohm
Lead Channel Pacing Threshold Amplitude: 0.375 V
Lead Channel Pacing Threshold Amplitude: 0.5 V
Lead Channel Pacing Threshold Pulse Width: 0.4 ms
Lead Channel Pacing Threshold Pulse Width: 0.4 ms
Lead Channel Sensing Intrinsic Amplitude: 14.25 mV
Lead Channel Sensing Intrinsic Amplitude: 14.25 mV
Lead Channel Sensing Intrinsic Amplitude: 3.375 mV
Lead Channel Sensing Intrinsic Amplitude: 3.375 mV
Lead Channel Setting Pacing Amplitude: 2 V
Lead Channel Setting Pacing Amplitude: 2.5 V
Lead Channel Setting Pacing Pulse Width: 0.4 ms
Lead Channel Setting Sensing Sensitivity: 0.3 mV
Zone Setting Status: 755011

## 2022-05-21 DIAGNOSIS — G4739 Other sleep apnea: Secondary | ICD-10-CM | POA: Diagnosis not present

## 2022-05-21 DIAGNOSIS — Z0000 Encounter for general adult medical examination without abnormal findings: Secondary | ICD-10-CM | POA: Diagnosis not present

## 2022-05-21 DIAGNOSIS — I5022 Chronic systolic (congestive) heart failure: Secondary | ICD-10-CM | POA: Diagnosis not present

## 2022-05-21 DIAGNOSIS — I480 Paroxysmal atrial fibrillation: Secondary | ICD-10-CM | POA: Diagnosis not present

## 2022-05-21 DIAGNOSIS — I10 Essential (primary) hypertension: Secondary | ICD-10-CM | POA: Diagnosis not present

## 2022-05-21 DIAGNOSIS — I700 Atherosclerosis of aorta: Secondary | ICD-10-CM | POA: Diagnosis not present

## 2022-05-28 ENCOUNTER — Other Ambulatory Visit: Payer: Self-pay | Admitting: Interventional Radiology

## 2022-05-28 DIAGNOSIS — I6522 Occlusion and stenosis of left carotid artery: Secondary | ICD-10-CM

## 2022-06-01 DIAGNOSIS — L110 Acquired keratosis follicularis: Secondary | ICD-10-CM | POA: Diagnosis not present

## 2022-06-01 DIAGNOSIS — M79672 Pain in left foot: Secondary | ICD-10-CM | POA: Diagnosis not present

## 2022-06-01 DIAGNOSIS — I739 Peripheral vascular disease, unspecified: Secondary | ICD-10-CM | POA: Diagnosis not present

## 2022-06-01 DIAGNOSIS — M79675 Pain in left toe(s): Secondary | ICD-10-CM | POA: Diagnosis not present

## 2022-06-01 DIAGNOSIS — M79671 Pain in right foot: Secondary | ICD-10-CM | POA: Diagnosis not present

## 2022-06-01 DIAGNOSIS — M79674 Pain in right toe(s): Secondary | ICD-10-CM | POA: Diagnosis not present

## 2022-06-08 NOTE — Progress Notes (Signed)
Remote ICD transmission.   

## 2022-06-22 ENCOUNTER — Telehealth: Payer: Self-pay | Admitting: Cardiology

## 2022-06-22 MED ORDER — METOPROLOL SUCCINATE ER 25 MG PO TB24: 25.0000 mg | ORAL_TABLET | Freq: Every day | ORAL | 1 refills | Status: DC

## 2022-06-22 NOTE — Telephone Encounter (Signed)
Done

## 2022-06-22 NOTE — Telephone Encounter (Signed)
*  STAT* If patient is at the pharmacy, call can be transferred to refill team.   1. Which medications need to be refilled? (please list name of each medication and dose if known) (TOPROL-XL) 25 MG 24 hr tablet   2. Which pharmacy/location (including street and city if local pharmacy) is medication to be sent to?[facility]   3. Do they need a 30 day or 90 day supply? 90  Please see change of Pharmacy.

## 2022-06-24 ENCOUNTER — Other Ambulatory Visit: Payer: Self-pay | Admitting: Interventional Radiology

## 2022-06-24 DIAGNOSIS — I6522 Occlusion and stenosis of left carotid artery: Secondary | ICD-10-CM

## 2022-07-06 ENCOUNTER — Other Ambulatory Visit: Payer: Self-pay | Admitting: Cardiology

## 2022-07-10 ENCOUNTER — Ambulatory Visit (HOSPITAL_COMMUNITY)
Admission: RE | Admit: 2022-07-10 | Discharge: 2022-07-10 | Disposition: A | Discharge status: Home / Self Care | Payer: 59 | Source: Ambulatory Visit | Attending: Interventional Radiology | Admitting: Interventional Radiology

## 2022-07-10 DIAGNOSIS — I771 Stricture of artery: Secondary | ICD-10-CM | POA: Diagnosis not present

## 2022-07-10 DIAGNOSIS — I6523 Occlusion and stenosis of bilateral carotid arteries: Secondary | ICD-10-CM | POA: Diagnosis not present

## 2022-07-10 DIAGNOSIS — I6522 Occlusion and stenosis of left carotid artery: Secondary | ICD-10-CM | POA: Diagnosis not present

## 2022-07-13 ENCOUNTER — Other Ambulatory Visit (HOSPITAL_COMMUNITY): Payer: 59

## 2022-07-15 ENCOUNTER — Ambulatory Visit
Admission: RE | Admit: 2022-07-15 | Discharge: 2022-07-15 | Disposition: A | Discharge status: Home / Self Care | Payer: 59 | Source: Ambulatory Visit | Attending: Interventional Radiology | Admitting: Interventional Radiology

## 2022-07-15 DIAGNOSIS — I6522 Occlusion and stenosis of left carotid artery: Secondary | ICD-10-CM | POA: Diagnosis not present

## 2022-07-15 HISTORY — PX: IR RADIOLOGIST EVAL & MGMT: IMG5224

## 2022-07-15 NOTE — Progress Notes (Signed)
Chief Complaint: Prior acute ELVO, right carotid stent Large vessel cerebral disease   Referring Physician(s):    Neurology: Dr. Delia Heady PCP: Dr. Lia Hopping Cardiology: Dr. Diona Browner   History of Present Illness: Christopher Lang is a right-handed, 67 y.o. male presenting as a scheduled follow up to VIR clinic.    Christopher Lang joins [place] today by way of virtual visit.  We confirmed his identity with 2 personal identifiers.    Hx: Christopher Lang was previously treated at [facility] for Code Stroke 11/03/2020, with mechanical thrombectomy of right MCA, and then interval right ICA stenting.  He was was also treated medically at the same time for STEMI, with deferral of cardiac cath for the thrombectomy performed of the right ICA/right MCA.     Presenting NIHSS: 15, mRS: 0.    Mechanical thrombectomy was performed 11/03/20, then with interval right ICA stenting for symptomatic high grade stenosis.     Discharge was 11/08/2020   ECHO 11/03/2020: EF: 20 - 25%, with severely decreased function, and wall motion abnormality suggesting LAD infarction.  Impression: Ischemic cardiomyopathy   EKG: 11/03/20: STEMI Anterior-septal   He underwent angiogram and attempted left ICA stenting 03/06/21.   We were unable to cross the complex calcified stenosis, and thus stenting was not performed.  He was discharged the following day 2/17.  Follow up with [facility] was 05/07/21 to discuss CEA, and I agree he is a candidate for treatment.  After discussion he elected to continue on maximal medical therapy   Interval Hx: Christopher Lang tells me today that he is doing fine with no new neurologic symptoms in the interval.  He has some residual left foot weakness.  He tells me he is planning to look into some physical therapy.    He continues on plavix and eliquis BID.  Has continuing care with cardiology.     Today he tells me that he is doing fine, with no new symptoms.  He has some residual ongoing weakness of the left  lower extremity, from his prior stroke.   He tells me he has healed fine from the angiogram, with no concerns.    We repeated a duplex 07/10/22 showing patent right ICA stent, and a stenosis of the left again 50-69%.     He continues maximal medical therapy:  including anti-lipids, plavix, HTN medication, and is prescribed eliquis for A-fib.    He has been successful with smoking cessation.   He has upcoming appointments with both neurology and with his PCP.   Past Medical History:  Diagnosis Date   Acute CVA (cerebrovascular accident) (HCC) 11/03/2020   Acute left-sided weakness 11/03/2020   Acute ST elevation myocardial infarction (STEMI) (HCC) 11/03/2020   a. s/p anterior STEMI in 10/2020 but not a candidate for invasive cath at that time given his acute CVA and having received TNK   Carotid artery disease [facility])    Cataract    Removed Dec 2017/Jan 2018   CHF (congestive heart failure) (HCC)    a. EF 20-25% by echo in 10/2020, similar results by repeat echo in 01/2021   COPD (chronic obstructive pulmonary disease) (HCC)    Coronary artery disease    Dyspnea    Essential hypertension    H. pylori infection 11/2016   With documented eradication via biopsy Feb 2019   Heart murmur    History of atrial fibrillation    November 2017   Hyperlipidemia    Peripheral vascular disease (HCC)  Sleep apnea    Did not tolerate CPAP   Tubulovillous adenoma of rectum 04/2010    Past Surgical History:  Procedure Laterality Date   CATARACT EXTRACTION W/PHACO Right 01/16/2016   Procedure: CATARACT EXTRACTION PHACO AND INTRAOCULAR LENS PLACEMENT RIGHT EYE CDE= 6.97;  Surgeon: Gemma Payor, MD;  Location: [facility];  Service: Ophthalmology;  Laterality: Right;  right   CATARACT EXTRACTION W/PHACO Left 01/30/2016   Procedure: CATARACT EXTRACTION PHACO AND INTRAOCULAR LENS PLACEMENT LEFT EYE CDE 3.13;  Surgeon: Gemma Payor, MD;  Location: [facility];  Service: Ophthalmology;  Laterality: Left;  left    COLONOSCOPY  2012   COLONOSCOPY N/A 12/04/2016   three 4-6 mm tubular adenomas, moderate size external and internal hemorrhoids, redundant left colon, 3 year surveillance   ESOPHAGOGASTRODUODENOSCOPY N/A 12/04/2016   Dr. Darrick Penna: normal esophagus, small hiatal hernia, chronic gastritis with H.pylori, peptic duodenitis, atypical lymphoid proliferation. Surveillance EGD Feb 2019 without abnormal cells, negative H.pylori   ESOPHAGOGASTRODUODENOSCOPY N/A 03/15/2017   mild gastritis, no atypical cells, negative H.pylori   EXCISION, SKI  08/2016   R THIGH   ICD IMPLANT N/A 08/01/2021   Procedure: ICD IMPLANT;  Surgeon: Marinus Maw, MD;  Location: [facility] INVASIVE CV LAB;  Service: Cardiovascular;  Laterality: N/A;   IR ANGIO INTRA EXTRACRAN SEL COM CAROTID INNOMINATE BILAT MOD SED  11/06/2020   IR ANGIO INTRA EXTRACRAN SEL COM CAROTID INNOMINATE BILAT MOD SED  03/06/2021   IR CT HEAD LTD  11/03/2020   IR INTRAVSC STENT CERV CAROTID W/EMB-PROT MOD SED INCL ANGIO  11/06/2020   IR PERCUTANEOUS ART THROMBECTOMY/INFUSION INTRACRANIAL INC DIAG ANGIO  11/03/2020   IR PTA INTRACRANIAL  11/04/2020   IR RADIOLOGIST EVAL & MGMT  01/23/2021   IR RADIOLOGIST EVAL & MGMT  04/15/2021   IR US GUIDE VASC ACCESS LEFT  11/03/2020   IR US GUIDE VASC ACCESS RIGHT  11/03/2020   IR US GUIDE VASC ACCESS RIGHT  11/06/2020   IR US GUIDE VASC ACCESS RIGHT  03/06/2021   RADIOLOGY WITH ANESTHESIA N/A 11/03/2020   Procedure: RADIOLOGY WITH ANESTHESIA;  Surgeon: Radiologist, Medication, MD;  Location: [facility] OR;  Service: Radiology;  Laterality: N/A;   RADIOLOGY WITH ANESTHESIA N/A 11/06/2020   Procedure: STENTING;  Surgeon: Gilmer Mor, DO;  Location: [facility] OR;  Service: Anesthesiology;  Laterality: N/A;   RADIOLOGY WITH ANESTHESIA Left 03/06/2021   Procedure: CAROTID STENT;  Surgeon: Gilmer Mor, DO;  Location: [facility] OR;  Service: Anesthesiology;  Laterality: Left;    Allergies: Patient has no known allergies.  Medications: Prior  to Admission medications   Medication Sig Start Date End Date Taking? Authorizing Provider  atorvastatin (LIPITOR) 40 MG tablet Take 1 tablet (40 mg total) by mouth daily. Patient taking differently: Take 40 mg by mouth daily at 6 PM. 07/23/21   Jonelle Sidle, MD  clopidogrel (PLAVIX) 75 MG tablet Take 1 tablet (75 mg total) by mouth daily. 11/09/20   Suzan Garibaldi, PA-C  ELIQUIS 5 MG TABS tablet TAKE 1 TABLET BY MOUTH TWICE A DAY 03/27/22   Jonelle Sidle, MD  FARXIGA 10 MG TABS tablet TAKE 1 TABLET BY MOUTH DAILY BEFORE BREAKFAST. 04/20/22   Jonelle Sidle, MD  ipratropium (ATROVENT) 0.06 % nasal spray Place 1 spray into both nostrils as needed. 08/05/21   [provider]  metoprolol succinate (TOPROL-XL) 25 MG 24 hr tablet Take 1 tablet (25 mg total) by mouth daily. 06/22/22   Jonelle Sidle, MD  sacubitril-valsartan (  ENTRESTO) 97-103 MG TAKE 1 TABLET BY MOUTH TWICE  DAILY 07/06/22   Jonelle Sidle, MD  spironolactone (ALDACTONE) 25 MG tablet Take 1 tablet (25 mg total) by mouth daily. 07/23/21   Jonelle Sidle, MD     Family History  Problem Relation Age of Onset   CAD Mother    High blood pressure Mother    Heart attack Mother    Leukemia Father    Early death Father    Heart attack Sister    Diabetes Sister    Cancer Brother    Deep vein thrombosis Brother    Hypertension Daughter    Stroke Sister    Stroke Sister    Heart attack Sister    Deep vein thrombosis Sister    Hypertension Sister    Diabetes Sister    Arthritis Sister    Arthritis Sister    Arthritis Sister    Hypertension Sister    Heart attack Sister    Cancer Brother        KIDNEY   Kidney disease Brother    Deep vein thrombosis Brother    Seizures Brother    Heart attack Brother    Colon cancer Neg Hx    Colon polyps Neg Hx     Social History   Socioeconomic History   Marital status: Married    Spouse name: Sales executive   Number of children: 2   Years of education: GED    Highest education level: Not on file  Occupational History   Occupation: disability    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  Tobacco Use   Smoking status: Former    Packs/day: 1.00    Years: 45.00    Additional pack years: 0.00    Total pack years: 45.00    Types: Cigarettes    Quit date: 11/03/2020    Years since quitting: 1.6   Smokeless tobacco: Never   Tobacco comments:    started back - pack per day  Vaping Use   Vaping Use: Never used  Substance and Sexual Activity   Alcohol use: Not Currently    Comment: none since 11/03/20   Drug use: No   Sexual activity: Yes  Other Topics Concern   Not on file  Social History Narrative   Lives with wife Denver Faster   Leisure - couch potato   Social Determinants of Health   Financial Resource Strain: Low Risk  (11/14/2020)   Overall Financial Resource Strain (CARDIA)    Difficulty of Paying Living Expenses: Not hard at all  Food Insecurity: No Food Insecurity (09/29/2021)   Hunger Vital Sign    Worried About Running Out of Food in the Last Year: Never true    Ran Out of Food in the Last Year: Never true  Transportation Needs: No Transportation Needs (09/29/2021)   PRAPARE - Administrator, Civil Service (Medical): No    Lack of Transportation (Non-Medical): No  Physical Activity: Inactive (11/14/2020)   Exercise Vital Sign    Days of Exercise per Week: 0 days    Minutes of Exercise per Session: 0 min  Stress: No Stress Concern Present (11/14/2020)   [employer] of Occupational Health - Occupational Stress Questionnaire    Feeling of Stress : Only a little  Social Connections: Not on file       Review of Systems  Review of Systems: A 12 point ROS discussed and pertinent positives are indicated in the HPI above.  All other  systems are negative.  Advance Care Plan: The advanced care plan/surrogate decision maker was discussed at the time of visit and documented in the medical record.    Physical Exam No direct  physical exam was performed (except for noted visual exam findings with Video Visits).    Vital Signs: There were no vitals taken for this visit.  Imaging: US Carotid Bilateral  Result Date: 07/13/2022 CLINICAL DATA:  67 year old male with a history carotid stenosis, prior right carotid stent at time of stroke, with known left-sided stenosis EXAM: BILATERAL CAROTID DUPLEX ULTRASOUND TECHNIQUE: Wallace Cullens scale imaging, color Doppler and duplex ultrasound were performed of bilateral carotid and vertebral arteries in the neck. COMPARISON:  Multiple prior, most recent duplex 04/10/2021 FINDINGS: Criteria: Quantification of carotid stenosis is based on velocity parameters that correlate the residual internal carotid diameter with NASCET-based stenosis levels, using the diameter of the distal internal carotid lumen as the denominator for stenosis measurement. The following velocity measurements were obtained: RIGHT ICA:  Systolic 153 cm/sec, Diastolic 41 cm/sec CCA:  101 cm/sec SYSTOLIC ICA/CCA RATIO:  1.5 ECA:  406 cm/sec LEFT ICA:  Systolic 206 cm/sec, Diastolic 47 cm/sec CCA:  56 cm/sec SYSTOLIC ICA/CCA RATIO:  3.7 ECA:  388 cm/sec Right Brachial SBP: Not acquired Left Brachial SBP: Not acquired RIGHT CAROTID ARTERY: Atherosclerotic changes of the right common carotid artery. Intermediate waveform maintained. Patent carotid stent. Lumen shadowing present from native calcifications. Low resistance waveform of the right ICA. No significant tortuosity. RIGHT VERTEBRAL ARTERY: Antegrade flow with low resistance waveform. LEFT CAROTID ARTERY: Atherosclerotic changes of the left common carotid artery. Intermediate waveform maintained. Moderate heterogeneous and partially calcified plaque at the left carotid bifurcation. Lumen shadowing present. Low resistance waveform of the left ICA. No significant tortuosity. LEFT VERTEBRAL ARTERY: Parvus tardus waveform of the left vertebral artery. IMPRESSION: Right: Patent right  carotid stent. Although duplex criteria have not been validated in the setting of prior carotid stenting, there is no evidence of recurrent high-grade stenosis. Left: Heterogeneous and partially calcified plaque at the left carotid bifurcation contributes to 50%-69% stenosis by established duplex criteria. Waveform of the left vertebral artery indicates proximal stenosis or occlusion. Signed, Yvone Neu. Miachel Roux, RPVI [facility] Radiology Electronically Signed   By: Gilmer Mor D.O.   On: 07/13/2022 09:57    Labs:  CBC: Recent Labs    07/16/21 1026  WBC 7.3  HGB 14.7  HCT 43.4  PLT 180    COAGS: No results for input(s): "INR", "APTT" in the last 8760 hours.  BMP: Recent Labs    07/16/21 1026 03/18/22 1219  NA 140 143  K 3.6 3.8  CL 107 104  CO2 27 24  GLUCOSE 75 92  BUN 11 13  CALCIUM 9.8 10.7*  CREATININE 0.76 0.88  GFRNONAA >60  --     LIVER FUNCTION TESTS: No results for input(s): "BILITOT", "AST", "ALT", "ALKPHOS", "PROT", "ALBUMIN" in the last 8760 hours.  TUMOR MARKERS: No results for input(s): "AFPTM", "CEA", "CA199", "CHROMGRNA" in the last 8760 hours.  Assessment and Plan:   Christopher Lang is a very pleasant, right-handed, 67 year old male with history of CVD.    He has history of bilateral, large artery vascular disease of the ICA, having been treated October 2022 for acute large vessel stroke/tandem occlusion with right MCA mechanical thrombectomy and treatment of right ICA lesion with stenting.    He also has asymptomatic left ICA disease.    At this  time he continues on maximal medical therapy for his known  CVD. He is a candidate for treatment of the left, however, he wants to be conservative, and he is currently stable with medical therapy only.       We will plan on 1 year follow up for our team, with duplex for the prior right ICA stent.    Plan: -   We will set up an appointment for 1 year follow  up with carotid duplex for surveillance of the right ICA stent, with Dr. Loreta Ave -       Continue intensive medical management, as above     Electronically Signed: Gilmer Mor 07/15/2022, 9:17 AM   I spent a total of    25 Minutes in remote  clinical consultation, greater than 50% of which was counseling/coordinating care for CVD, prior right MCA stroke with carotid stenosis treated with stent, and left ICA disease.    Visit type: Audio only (telephone). Audio (no video) only due to patient's lack of internet/smartphone capability. Alternative for in-person consultation at [facility], [address], [place], [place]. This visit type was conducted due to national recommendations for restrictions regarding the COVID-19 Pandemic (e.g. social distancing).  This format is felt to be most appropriate for this patient at this time.  All issues noted in this document were discussed and addressed.

## 2022-08-04 ENCOUNTER — Ambulatory Visit: Payer: 59

## 2022-08-04 DIAGNOSIS — I255 Ischemic cardiomyopathy: Secondary | ICD-10-CM | POA: Diagnosis not present

## 2022-08-05 LAB — CUP PACEART REMOTE DEVICE CHECK
Battery Remaining Longevity: 112 mo
Battery Voltage: 3.02 V
Brady Statistic AP VP Percent: 0.09 %
Brady Statistic AP VS Percent: 54.65 %
Brady Statistic AS VP Percent: 0.01 %
Brady Statistic AS VS Percent: 45.25 %
Brady Statistic RA Percent Paced: 54.6 %
Brady Statistic RV Percent Paced: 0.1 %
Date Time Interrogation Session: 20240716001604
HighPow Impedance: 59 Ohm
Implantable Lead Connection Status: 753985
Implantable Lead Connection Status: 753985
Implantable Lead Implant Date: 20230714
Implantable Lead Implant Date: 20230714
Implantable Lead Location: 753859
Implantable Lead Location: 753860
Implantable Lead Model: 5076
Implantable Pulse Generator Implant Date: 20230714
Lead Channel Impedance Value: 304 Ohm
Lead Channel Impedance Value: 399 Ohm
Lead Channel Impedance Value: 399 Ohm
Lead Channel Pacing Threshold Amplitude: 0.5 V
Lead Channel Pacing Threshold Amplitude: 0.625 V
Lead Channel Pacing Threshold Pulse Width: 0.4 ms
Lead Channel Pacing Threshold Pulse Width: 0.4 ms
Lead Channel Sensing Intrinsic Amplitude: 18.125 mV
Lead Channel Sensing Intrinsic Amplitude: 18.125 mV
Lead Channel Sensing Intrinsic Amplitude: 4.375 mV
Lead Channel Sensing Intrinsic Amplitude: 4.375 mV
Lead Channel Setting Pacing Amplitude: 2 V
Lead Channel Setting Pacing Amplitude: 2.5 V
Lead Channel Setting Pacing Pulse Width: 0.4 ms
Lead Channel Setting Sensing Sensitivity: 0.3 mV
Zone Setting Status: 755011

## 2022-08-17 DIAGNOSIS — I5022 Chronic systolic (congestive) heart failure: Secondary | ICD-10-CM | POA: Diagnosis not present

## 2022-08-17 DIAGNOSIS — I700 Atherosclerosis of aorta: Secondary | ICD-10-CM | POA: Diagnosis not present

## 2022-08-17 DIAGNOSIS — G4739 Other sleep apnea: Secondary | ICD-10-CM | POA: Diagnosis not present

## 2022-08-17 DIAGNOSIS — I10 Essential (primary) hypertension: Secondary | ICD-10-CM | POA: Diagnosis not present

## 2022-08-17 DIAGNOSIS — I480 Paroxysmal atrial fibrillation: Secondary | ICD-10-CM | POA: Diagnosis not present

## 2022-08-21 NOTE — Progress Notes (Signed)
Remote ICD transmission.   

## 2022-08-30 ENCOUNTER — Other Ambulatory Visit: Payer: Self-pay | Admitting: Cardiology

## 2022-08-31 DIAGNOSIS — M79675 Pain in left toe(s): Secondary | ICD-10-CM | POA: Diagnosis not present

## 2022-08-31 DIAGNOSIS — M79671 Pain in right foot: Secondary | ICD-10-CM | POA: Diagnosis not present

## 2022-08-31 DIAGNOSIS — M79672 Pain in left foot: Secondary | ICD-10-CM | POA: Diagnosis not present

## 2022-08-31 DIAGNOSIS — M79674 Pain in right toe(s): Secondary | ICD-10-CM | POA: Diagnosis not present

## 2022-08-31 DIAGNOSIS — I739 Peripheral vascular disease, unspecified: Secondary | ICD-10-CM | POA: Diagnosis not present

## 2022-08-31 DIAGNOSIS — L110 Acquired keratosis follicularis: Secondary | ICD-10-CM | POA: Diagnosis not present

## 2022-09-22 NOTE — Progress Notes (Signed)
Cardiology Office Note  Date: 09/23/2022   ID: Christopher Lang, DOB [birth year], MRN [id number]  History of Present Illness: Christopher Lang is a 67 y.o. male last seen in February.  He is here for a routine visit.  Reports no angina, stable NYHA class II dyspnea, no fluid retention or major change in weight.  He states that he has been taking his medications regularly.  Medtronic ICD in place with follow-up by Dr. Ladona Ridgel.  Device interrogation in July revealed normal function.  He denies any device shocks or syncope.  We went over his cardiac medications, he remains on a well-rounded regimen as outlined below.  No continuous bleeding problems on Eliquis and Plavix.  ECG today shows sinus rhythm with old anterior infarct pattern and nonspecific ST changes.  Physical Exam: VS:  BP (!) 140/82 (BP Location: Left Arm)   Pulse 72   Ht 6' (1.829 m)   Wt 199 lb 3.2 oz (90.4 kg)   SpO2 100%   BMI 27.02 kg/m , BMI Body mass index is 27.02 kg/m.  Wt Readings from Last 3 Encounters:  09/23/22 199 lb 3.2 oz (90.4 kg)  03/18/22 200 lb 6.4 oz (90.9 kg)  11/04/21 193 lb (87.5 kg)    General: Patient appears comfortable at rest. HEENT: Conjunctiva and lids normal. Neck: Supple, no elevated JVP or carotid bruits. Lungs: Clear to auscultation, nonlabored breathing at rest. Cardiac: Regular rate and rhythm, no S3 or significant systolic murmur. Extremities: No pitting edema.  ECG:  An ECG dated 08/01/2021 was personally reviewed today and demonstrated:  Atrial pacing with old anterior infarct pattern and nonspecific ST-T changes.  Labwork: 03/18/2022: BUN 13; Creatinine, Ser 0.88; Potassium 3.8; Sodium 143     Component Value Date/Time   CHOL 108 04/04/2021 0957   CHOL 160 06/23/2018 1445   TRIG 52 04/04/2021 0957   HDL 39 (L) 04/04/2021 0957   HDL 29 (L) 06/23/2018 1445   CHOLHDL 2.8 04/04/2021 0957   VLDL 10 04/04/2021 0957   LDLCALC 59 04/04/2021 0957   LDLCALC 77 08/15/2019 1002    Other Studies Reviewed Today:  Echocardiogram 04/08/2022:  1. Left ventricular ejection fraction, by estimation, is 35 to 40%. The  left ventricle has moderately decreased function. The left ventricle  demonstrates regional wall motion abnormalities (see scoring  diagram/findings for description). There is mild  asymmetric left ventricular hypertrophy of the septal segment. Left  ventricular diastolic parameters are consistent with Grade I diastolic  dysfunction (impaired relaxation).   2. No formed LV mural thrombus noted with Definity contrast.   3. Right ventricular systolic function is normal. The right ventricular  size is normal. There is normal pulmonary artery systolic pressure. The  estimated right ventricular systolic pressure is 23.6 mmHg.   4. Left atrial size was moderately dilated.   5. The mitral valve is degenerative. Mild mitral valve regurgitation.   6. The aortic valve is tricuspid. There is mild calcification of the  aortic valve. Aortic valve regurgitation is not visualized. Aortic valve  sclerosis/calcification is present, without any evidence of aortic  stenosis. Aortic valve mean gradient  measures 5.0 mmHg.   7. The inferior vena cava is normal in size with greater than 50%  respiratory variability, suggesting right atrial pressure of 3 mmHg.   Assessment and Plan:  1.  HFrEF with ischemic cardiomyopathy, LVEF 33% by cardiac MRI in April 2023 with nonviable inferior infarct scar.  Follow-up echocardiogram in March of this year revealed  LVEF 35 to 40% range.  Medtronic ICD in place with follow-up by Dr. Ladona Ridgel.  He has had no device shocks or syncope.  Remains clinically stable with NYHA class II dyspnea and no fluid retention.  Continue Toprol-XL, Entresto, Aldactone, and [place].   2.  Paroxysmal atrial fibrillation with CHA2DS2-VASc score of 6.  He continues on Eliquis for stroke prophylaxis.  Denies palpitations.  He does not describe any spontaneous  bleeding problems.   3.  Bilateral carotid artery disease status post right ICA stent intervention.  Carotid Dopplers in June revealed patent RICA stent and 50 to 69% LICA stenosis.  He continues on Plavix and has had follow-up with [facility] and neurology.   4.  CAD with history of previous anterior wall infarct and October 2022 managed medically (invasive testing not pursued at that point since he had received TNK in the setting of stroke).  No active angina at this time.   5.  Mixed hyperlipidemia, last LDL 59 in March 2023.  Continues on Lipitor 40 mg daily.  Disposition:  Follow up  6 months.  Signed, Jonelle Sidle, M.D., F.A.C.C. [facility]

## 2022-09-23 ENCOUNTER — Encounter: Payer: Self-pay | Admitting: Cardiology

## 2022-09-23 ENCOUNTER — Ambulatory Visit: Payer: 59 | Attending: Cardiology | Admitting: Cardiology

## 2022-09-23 VITALS — BP 140/82 | HR 72 | Ht 72.0 in | Wt 199.2 lb

## 2022-09-23 DIAGNOSIS — E782 Mixed hyperlipidemia: Secondary | ICD-10-CM | POA: Diagnosis not present

## 2022-09-23 DIAGNOSIS — I25119 Atherosclerotic heart disease of native coronary artery with unspecified angina pectoris: Secondary | ICD-10-CM

## 2022-09-23 DIAGNOSIS — I5020 Unspecified systolic (congestive) heart failure: Secondary | ICD-10-CM

## 2022-09-23 DIAGNOSIS — I480 Paroxysmal atrial fibrillation: Secondary | ICD-10-CM | POA: Diagnosis not present

## 2022-09-23 DIAGNOSIS — I6523 Occlusion and stenosis of bilateral carotid arteries: Secondary | ICD-10-CM

## 2022-09-23 NOTE — Patient Instructions (Addendum)
Medication Instructions:  Continue all current medications.   Labwork: none  Testing/Procedures: none  Follow-Up: 6 months   Any Other Special Instructions Will Be Listed Below (If Applicable).   If you need a refill on your cardiac medications before your next appointment, please call your pharmacy.  

## 2022-10-06 ENCOUNTER — Ambulatory Visit: Payer: 59 | Admitting: Neurology

## 2022-10-12 ENCOUNTER — Telehealth: Payer: Self-pay | Admitting: Cardiology

## 2022-10-12 MED ORDER — ENTRESTO 97-103 MG PO TABS: 1.0000 | ORAL_TABLET | Freq: Two times a day (BID) | ORAL | 2 refills | Status: DC

## 2022-10-12 MED ORDER — APIXABAN 5 MG PO TABS: 5.0000 mg | ORAL_TABLET | Freq: Two times a day (BID) | ORAL | 1 refills | Status: DC

## 2022-10-12 NOTE — Telephone Encounter (Signed)
Entresto refill completed. Will route to coumadin nurse for Eliquis refill.

## 2022-10-12 NOTE — Telephone Encounter (Signed)
Prescription refill request for Eliquis received. Indication: AF Last office visit: 09/23/22  Ival Bible MD Scr: 0.88 on 03/18/22  Epic Age: 67 Weight: 90.4kg  Based on above findings Eliquis 5mg  twice daily is the appropriate dose.  Refill approved.

## 2022-10-12 NOTE — Telephone Encounter (Signed)
*  STAT* If patient is at the pharmacy, call can be transferred to refill team.   1. Which medications need to be refilled? (please list name of each medication and dose if known)   Entresto  Eliquis   2. Would you like to learn more about the convenience, safety, & potential cost savings by using the [facility]? no     3. Are you open to using the [facility] (Type [facility]. no   4. Which pharmacy/location (including street and city if local pharmacy) is medication to be sent to?  [facility]    5. Do they need a 30 day or 90 day supply? 90

## 2022-10-21 DIAGNOSIS — I10 Essential (primary) hypertension: Secondary | ICD-10-CM | POA: Diagnosis not present

## 2022-10-21 DIAGNOSIS — I5022 Chronic systolic (congestive) heart failure: Secondary | ICD-10-CM | POA: Diagnosis not present

## 2022-10-21 DIAGNOSIS — I700 Atherosclerosis of aorta: Secondary | ICD-10-CM | POA: Diagnosis not present

## 2022-10-21 DIAGNOSIS — I480 Paroxysmal atrial fibrillation: Secondary | ICD-10-CM | POA: Diagnosis not present

## 2022-11-03 ENCOUNTER — Ambulatory Visit (INDEPENDENT_AMBULATORY_CARE_PROVIDER_SITE_OTHER): Payer: 59

## 2022-11-03 ENCOUNTER — Encounter: Payer: Self-pay | Admitting: Internal Medicine

## 2022-11-03 ENCOUNTER — Ambulatory Visit: Payer: 59 | Attending: Internal Medicine | Admitting: Internal Medicine

## 2022-11-03 VITALS — BP 158/82 | HR 65 | Ht 72.0 in | Wt 200.4 lb

## 2022-11-03 DIAGNOSIS — I255 Ischemic cardiomyopathy: Secondary | ICD-10-CM

## 2022-11-03 DIAGNOSIS — I5020 Unspecified systolic (congestive) heart failure: Secondary | ICD-10-CM

## 2022-11-03 DIAGNOSIS — I5022 Chronic systolic (congestive) heart failure: Secondary | ICD-10-CM

## 2022-11-03 NOTE — Progress Notes (Signed)
HPI Christopher Lang returns for ongoing evaluation s/p ICD insertion for primary prevention of malignant ventricular arrhythmias. He is a pleasant 67 yo man who sustained an anterior MI over a year ago. He has been placed on GDMT and has class 2 symptoms. He has PAF. He has an EF by CMRI of 33%. He has not had syncope. He also has known carotid disease and COPD. No palpitations. He underwent ICD insertion several months ago. In the interim he notes that he has done well.  No Known Allergies   Current Outpatient Medications  Medication Sig Dispense Refill   apixaban (ELIQUIS) 5 MG TABS tablet Take 1 tablet (5 mg total) by mouth 2 (two) times daily. 180 tablet 1   atorvastatin (LIPITOR) 40 MG tablet Take 1 tablet (40 mg total) by mouth daily. (Patient taking differently: Take 40 mg by mouth daily at 6 PM.) 90 tablet 1   Cholecalciferol (VITAMIN D-3 PO) Take 1 tablet by mouth daily.     clopidogrel (PLAVIX) 75 MG tablet Take 1 tablet (75 mg total) by mouth daily. 60 tablet 0   FARXIGA 10 MG TABS tablet TAKE 1 TABLET BY MOUTH DAILY BEFORE BREAKFAST. 90 tablet 3   ipratropium (ATROVENT) 0.06 % nasal spray Place 1 spray into both nostrils as needed.     metoprolol succinate (TOPROL-XL) 25 MG 24 hr tablet TAKE 1 TABLET BY MOUTH DAILY 100 tablet 2   Omega-3 Fatty Acids (FISH OIL) 1000 MG CPDR Take 1 capsule by mouth 2 (two) times daily.     sacubitril-valsartan (ENTRESTO) 97-103 MG Take 1 tablet by mouth 2 (two) times daily. 180 tablet 2   spironolactone (ALDACTONE) 25 MG tablet Take 1 tablet (25 mg total) by mouth daily. 90 tablet 1   No current facility-administered medications for this visit.     Past Medical History:  Diagnosis Date   Acute CVA (cerebrovascular accident) (HCC) 11/03/2020   Acute left-sided weakness 11/03/2020   Acute ST elevation myocardial infarction (STEMI) (HCC) 11/03/2020   a. s/p anterior STEMI in 10/2020 but not a candidate for invasive cath at that time given his  acute CVA and having received TNK   Carotid artery disease [facility])    Cataract    Removed Dec 2017/Jan 2018   CHF (congestive heart failure) (HCC)    a. EF 20-25% by echo in 10/2020, similar results by repeat echo in 01/2021   COPD (chronic obstructive pulmonary disease) (HCC)    Coronary artery disease    Dyspnea    Essential hypertension    H. pylori infection 11/2016   With documented eradication via biopsy Feb 2019   Heart murmur    History of atrial fibrillation    November 2017   Hyperlipidemia    Peripheral vascular disease (HCC)    Sleep apnea    Did not tolerate CPAP   Tubulovillous adenoma of rectum 04/2010    ROS:   All systems reviewed and negative except as noted in the HPI.   Past Surgical History:  Procedure Laterality Date   CATARACT EXTRACTION W/PHACO Right 01/16/2016   Procedure: CATARACT EXTRACTION PHACO AND INTRAOCULAR LENS PLACEMENT RIGHT EYE CDE= 6.97;  Surgeon: Gemma Payor, MD;  Location: [facility];  Service: Ophthalmology;  Laterality: Right;  right   CATARACT EXTRACTION W/PHACO Left 01/30/2016   Procedure: CATARACT EXTRACTION PHACO AND INTRAOCULAR LENS PLACEMENT LEFT EYE CDE 3.13;  Surgeon: Gemma Payor, MD;  Location: [facility];  Service: Ophthalmology;  Laterality: Left;  left   COLONOSCOPY  2012   COLONOSCOPY N/A 12/04/2016   three 4-6 mm tubular adenomas, moderate size external and internal hemorrhoids, redundant left colon, 3 year surveillance   ESOPHAGOGASTRODUODENOSCOPY N/A 12/04/2016   Dr. Darrick Penna: normal esophagus, small hiatal hernia, chronic gastritis with H.pylori, peptic duodenitis, atypical lymphoid proliferation. Surveillance EGD Feb 2019 without abnormal cells, negative H.pylori   ESOPHAGOGASTRODUODENOSCOPY N/A 03/15/2017   mild gastritis, no atypical cells, negative H.pylori   EXCISION, SKI  08/2016   R THIGH   ICD IMPLANT N/A 08/01/2021   Procedure: ICD IMPLANT;  Surgeon: Marinus Maw, MD;  Location: [facility] INVASIVE CV LAB;  Service:  Cardiovascular;  Laterality: N/A;   IR ANGIO INTRA EXTRACRAN SEL COM CAROTID INNOMINATE BILAT MOD SED  11/06/2020   IR ANGIO INTRA EXTRACRAN SEL COM CAROTID INNOMINATE BILAT MOD SED  03/06/2021   IR CT HEAD LTD  11/03/2020   IR INTRAVSC STENT CERV CAROTID W/EMB-PROT MOD SED INCL ANGIO  11/06/2020   IR PERCUTANEOUS ART THROMBECTOMY/INFUSION INTRACRANIAL INC DIAG ANGIO  11/03/2020   IR PTA INTRACRANIAL  11/04/2020   IR RADIOLOGIST EVAL & MGMT  01/23/2021   IR RADIOLOGIST EVAL & MGMT  04/15/2021   IR RADIOLOGIST EVAL & MGMT  07/15/2022   IR US GUIDE VASC ACCESS LEFT  11/03/2020   IR US GUIDE VASC ACCESS RIGHT  11/03/2020   IR US GUIDE VASC ACCESS RIGHT  11/06/2020   IR US GUIDE VASC ACCESS RIGHT  03/06/2021   RADIOLOGY WITH ANESTHESIA N/A 11/03/2020   Procedure: RADIOLOGY WITH ANESTHESIA;  Surgeon: Radiologist, Medication, MD;  Location: [facility] OR;  Service: Radiology;  Laterality: N/A;   RADIOLOGY WITH ANESTHESIA N/A 11/06/2020   Procedure: STENTING;  Surgeon: Gilmer Mor, DO;  Location: [facility] OR;  Service: Anesthesiology;  Laterality: N/A;   RADIOLOGY WITH ANESTHESIA Left 03/06/2021   Procedure: CAROTID STENT;  Surgeon: Gilmer Mor, DO;  Location: [facility] OR;  Service: Anesthesiology;  Laterality: Left;     Family History  Problem Relation Age of Onset   CAD Mother    High blood pressure Mother    Heart attack Mother    Leukemia Father    Early death Father    Heart attack Sister    Diabetes Sister    Cancer Brother    Deep vein thrombosis Brother    Hypertension Daughter    Stroke Sister    Stroke Sister    Heart attack Sister    Deep vein thrombosis Sister    Hypertension Sister    Diabetes Sister    Arthritis Sister    Arthritis Sister    Arthritis Sister    Hypertension Sister    Heart attack Sister    Cancer Brother        KIDNEY   Kidney disease Brother    Deep vein thrombosis Brother    Seizures Brother    Heart attack Brother    Colon cancer Neg Hx    Colon polyps Neg Hx       Social History   Socioeconomic History   Marital status: Married    Spouse name: Denver Faster   Number of children: 2   Years of education: GED   Highest education level: Not on file  Occupational History   Occupation: disability    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  Tobacco Use   Smoking status: Former    Current packs/day: 0.00    Average packs/day: 1 pack/day for 45.0 years (45.0 ttl pk-yrs)  Types: Cigarettes    Start date: 11/04/1975    Quit date: 11/03/2020    Years since quitting: 2.0   Smokeless tobacco: Never   Tobacco comments:    started back - pack per day  Vaping Use   Vaping status: Never Used  Substance and Sexual Activity   Alcohol use: Not Currently    Comment: none since 11/03/20   Drug use: No   Sexual activity: Yes  Other Topics Concern   Not on file  Social History Narrative   Lives with wife Denver Faster   Leisure - couch potato   Social Determinants of Health   Financial Resource Strain: Low Risk  (11/14/2020)   Overall Financial Resource Strain (CARDIA)    Difficulty of Paying Living Expenses: Not hard at all  Food Insecurity: No Food Insecurity (09/29/2021)   Hunger Vital Sign    Worried About Running Out of Food in the Last Year: Never true    Ran Out of Food in the Last Year: Never true  Transportation Needs: No Transportation Needs (09/29/2021)   PRAPARE - Administrator, Civil Service (Medical): No    Lack of Transportation (Non-Medical): No  Physical Activity: Inactive (11/14/2020)   Exercise Vital Sign    Days of Exercise per Week: 0 days    Minutes of Exercise per Session: 0 min  Stress: No Stress Concern Present (11/14/2020)   [employer] of Occupational Health - Occupational Stress Questionnaire    Feeling of Stress : Only a little  Social Connections: Not on file  Intimate Partner Violence: Not At Risk (11/14/2020)   Humiliation, Afraid, Rape, and Kick questionnaire    Fear of Current or Ex-Partner: No     Emotionally Abused: No    Physically Abused: No    Sexually Abused: No     BP (!) 160/80 (BP Location: Right Arm, Patient Position: Sitting, Cuff Size: Normal) Comment: has not taken am meds yet  Pulse 65   Ht 6' (1.829 m)   Wt 200 lb 6.4 oz (90.9 kg)   SpO2 97%   BMI 27.18 kg/m   Physical Exam:  Well appearing 67 yo man, NAD HEENT: Unremarkable Neck:  No JVD, no thyromegally Lymphatics:  No adenopathy Back:  No CVA tenderness Lungs:  Clear with no wheezes HEART:  Regular rate rhythm, no murmurs, no rubs, no clicks Abd:  soft, positive bowel sounds, no organomegally, no rebound, no guarding Ext:  2 plus pulses, no edema, no cyanosis, no clubbing Skin:  No rashes no nodules Neuro:  CN II through XII intact, motor grossly intact  DEVICE  Normal device function.  See PaceArt for details.   Assess/Plan:  ICM - he is s/p MI with an EF of 33% despite GDMT. He denies anginal symptoms.  Chronic systolic heart failure - his symptoms are class 2. He will continue his current meds. HTN - he did not take his morning meds this morning and his bp is up. He will take them as soon as he gets home.  Dyslipidemia - he will continue lipitor 40 mg daily.   Sharlot Gowda Jenasia Dolinar,MD

## 2022-11-03 NOTE — Patient Instructions (Signed)
Medication Instructions:  Your physician recommends that you continue on your current medications as directed. Please refer to the Current Medication list given to you today.  *If you need a refill on your cardiac medications before your next appointment, please call your pharmacy*   Lab Work: NONE   If you have labs (blood work) drawn today and your tests are completely normal, you will receive your results only by: MyChart Message (if you have MyChart) OR A paper copy in the mail If you have any lab test that is abnormal or we need to change your treatment, we will call you to review the results.   Testing/Procedures: NONE    Follow-Up: At [facility], you and your health needs are our priority.  As part of our continuing mission to provide you with exceptional heart care, we have created designated Provider Care Teams.  These Care Teams include your primary Cardiologist (physician) and Advanced Practice Providers (APPs -  Physician Assistants and Nurse Practitioners) who all work together to provide you with the care you need, when you need it.  We recommend signing up for the patient portal called "MyChart".  Sign up information is provided on this After Visit Summary.  MyChart is used to connect with patients for Virtual Visits (Telemedicine).  Patients are able to view lab/test results, encounter notes, upcoming appointments, etc.  Non-urgent messages can be sent to your provider as well.   To learn more about what you can do with MyChart, go to [email or web address].    Your next appointment:   1 year(s)  Provider:   Lewayne Bunting, MD    Other Instructions Thank you for choosing [facility]!

## 2022-11-04 LAB — CUP PACEART REMOTE DEVICE CHECK
Battery Remaining Longevity: 109 mo
Battery Voltage: 3.02 V
Brady Statistic AP VP Percent: 0.11 %
Brady Statistic AP VS Percent: 59.61 %
Brady Statistic AS VP Percent: 0.01 %
Brady Statistic AS VS Percent: 40.28 %
Brady Statistic RA Percent Paced: 59.4 %
Brady Statistic RV Percent Paced: 0.1 %
Date Time Interrogation Session: 20241015022723
HighPow Impedance: 62 Ohm
Implantable Lead Connection Status: 753985
Implantable Lead Connection Status: 753985
Implantable Lead Implant Date: 20230714
Implantable Lead Implant Date: 20230714
Implantable Lead Location: 753859
Implantable Lead Location: 753860
Implantable Lead Model: 5076
Implantable Pulse Generator Implant Date: 20230714
Lead Channel Impedance Value: 342 Ohm
Lead Channel Impedance Value: 399 Ohm
Lead Channel Impedance Value: 399 Ohm
Lead Channel Pacing Threshold Amplitude: 0.375 V
Lead Channel Pacing Threshold Amplitude: 0.625 V
Lead Channel Pacing Threshold Pulse Width: 0.4 ms
Lead Channel Pacing Threshold Pulse Width: 0.4 ms
Lead Channel Sensing Intrinsic Amplitude: 15.25 mV
Lead Channel Sensing Intrinsic Amplitude: 15.25 mV
Lead Channel Sensing Intrinsic Amplitude: 4.125 mV
Lead Channel Sensing Intrinsic Amplitude: 4.125 mV
Lead Channel Setting Pacing Amplitude: 2 V
Lead Channel Setting Pacing Amplitude: 2.5 V
Lead Channel Setting Pacing Pulse Width: 0.4 ms
Lead Channel Setting Sensing Sensitivity: 0.3 mV
Zone Setting Status: 755011

## 2022-11-04 LAB — CUP PACEART INCLINIC DEVICE CHECK
Date Time Interrogation Session: 20241015111210
Implantable Lead Connection Status: 753985
Implantable Lead Connection Status: 753985
Implantable Lead Implant Date: 20230714
Implantable Lead Implant Date: 20230714
Implantable Lead Location: 753859
Implantable Lead Location: 753860
Implantable Lead Model: 5076
Implantable Pulse Generator Implant Date: 20230714

## 2022-11-16 DIAGNOSIS — I10 Essential (primary) hypertension: Secondary | ICD-10-CM | POA: Diagnosis not present

## 2022-11-16 DIAGNOSIS — I480 Paroxysmal atrial fibrillation: Secondary | ICD-10-CM | POA: Diagnosis not present

## 2022-11-16 DIAGNOSIS — I700 Atherosclerosis of aorta: Secondary | ICD-10-CM | POA: Diagnosis not present

## 2022-11-16 DIAGNOSIS — G4739 Other sleep apnea: Secondary | ICD-10-CM | POA: Diagnosis not present

## 2022-11-16 DIAGNOSIS — E782 Mixed hyperlipidemia: Secondary | ICD-10-CM | POA: Diagnosis not present

## 2022-11-16 DIAGNOSIS — I5022 Chronic systolic (congestive) heart failure: Secondary | ICD-10-CM | POA: Diagnosis not present

## 2022-11-23 NOTE — Progress Notes (Signed)
Remote ICD transmission.   

## 2022-11-30 DIAGNOSIS — M79671 Pain in right foot: Secondary | ICD-10-CM | POA: Diagnosis not present

## 2022-11-30 DIAGNOSIS — I739 Peripheral vascular disease, unspecified: Secondary | ICD-10-CM | POA: Diagnosis not present

## 2022-11-30 DIAGNOSIS — M79672 Pain in left foot: Secondary | ICD-10-CM | POA: Diagnosis not present

## 2022-11-30 DIAGNOSIS — M79674 Pain in right toe(s): Secondary | ICD-10-CM | POA: Diagnosis not present

## 2022-11-30 DIAGNOSIS — L110 Acquired keratosis follicularis: Secondary | ICD-10-CM | POA: Diagnosis not present

## 2022-11-30 DIAGNOSIS — M79675 Pain in left toe(s): Secondary | ICD-10-CM | POA: Diagnosis not present

## 2022-12-10 DIAGNOSIS — I700 Atherosclerosis of aorta: Secondary | ICD-10-CM | POA: Diagnosis not present

## 2022-12-10 DIAGNOSIS — I480 Paroxysmal atrial fibrillation: Secondary | ICD-10-CM | POA: Diagnosis not present

## 2022-12-10 DIAGNOSIS — I10 Essential (primary) hypertension: Secondary | ICD-10-CM | POA: Diagnosis not present

## 2022-12-10 DIAGNOSIS — I5022 Chronic systolic (congestive) heart failure: Secondary | ICD-10-CM | POA: Diagnosis not present

## 2023-01-10 ENCOUNTER — Other Ambulatory Visit: Payer: Self-pay | Admitting: Cardiology

## 2023-01-11 DIAGNOSIS — I5022 Chronic systolic (congestive) heart failure: Secondary | ICD-10-CM | POA: Diagnosis not present

## 2023-01-11 DIAGNOSIS — I10 Essential (primary) hypertension: Secondary | ICD-10-CM | POA: Diagnosis not present

## 2023-01-11 DIAGNOSIS — I700 Atherosclerosis of aorta: Secondary | ICD-10-CM | POA: Diagnosis not present

## 2023-01-11 DIAGNOSIS — I480 Paroxysmal atrial fibrillation: Secondary | ICD-10-CM | POA: Diagnosis not present

## 2023-01-11 NOTE — Telephone Encounter (Signed)
Prescription refill request for Eliquis received. Indication:afib Last office visit:10/24 Scr:0.88  2/24 Age: 67 Weight:90.9  kg  Prescription refilled

## 2023-01-26 ENCOUNTER — Ambulatory Visit (INDEPENDENT_AMBULATORY_CARE_PROVIDER_SITE_OTHER): Payer: 59 | Admitting: Neurology

## 2023-01-26 ENCOUNTER — Encounter: Payer: Self-pay | Admitting: Neurology

## 2023-01-26 VITALS — BP 151/71 | HR 63 | Ht 72.0 in | Wt 210.2 lb

## 2023-01-26 DIAGNOSIS — Z8673 Personal history of transient ischemic attack (TIA), and cerebral infarction without residual deficits: Secondary | ICD-10-CM | POA: Diagnosis not present

## 2023-01-26 DIAGNOSIS — I6522 Occlusion and stenosis of left carotid artery: Secondary | ICD-10-CM

## 2023-01-26 DIAGNOSIS — Z9889 Other specified postprocedural states: Secondary | ICD-10-CM | POA: Diagnosis not present

## 2023-01-26 DIAGNOSIS — Z95828 Presence of other vascular implants and grafts: Secondary | ICD-10-CM

## 2023-01-26 NOTE — Patient Instructions (Signed)
 had a long d/w patient about his remote stroke, carotid stent and left carotid moderate stenosis, atrial fibrillatrion,risk for recurrent stroke/TIAs, personally independently reviewed imaging studies and stroke evaluation results and answered questions.Continue Eliquis  (apixaban ) daily  for secondary stroke prevention for his atrial fibrillation and Plavix  for his carotid stent and maintain strict control of hypertension with blood pressure goal below 130/90, diabetes with hemoglobin A1c goal below 6.5% and lipids with LDL cholesterol goal below 70 mg/dL. I also advised the patient to eat a healthy diet with plenty of whole grains, cereals, fruits and vegetables, exercise regularly and maintain ideal body weight .Check screening carotid ultrasound and if moderate left carotid stenosis has progressed may need carotid revascularization.  Return for follow-up in the future only as necessary and no scheduled appointment was made.

## 2023-01-26 NOTE — Progress Notes (Signed)
 [facility] [address] [place]. [place] [address] [phone]       OFFICE FOLLOW-UP VISIT NOTE  Christopher Lang Date of Birth:  30-Dec-1955 Medical Record Number:  [id number]   Referring MD: Ary Cummins  Reason for Referral: Stroke  HPI: Initial visit 09/25/2021 Christopher Lang is a 68 year old African-American male seen today for initial office consultation visit for stroke.  History is obtained from the patient, review of electronic medical records and personally reviewed pertinent available imaging films in PACS.  He has past medical history significant for hyperlipidemia, sleep apnea, coronary artery disease, COPD, atrial fibrillation who presented on 11/03/2020 with chest pain and feeling flushed by the time EMS were called by the wife and they arrived they noted she had left sided weakness EKGs done by EMS suggested anterior STEMI he was brought in as a code STEMI in the ED code stroke was activated and he was found to have significant left hemiplegia.  CT scan of the head showed a hyperdense right M1 1 sign and CT angiogram of the head and neck confirmed right M1 occlusion with severe bilateral ICA origin stenosis with underfilling of the left carotid with moderate to severe stenosis of bilateral carotid siphons and 60% left subclavian origin stenosis.  Patient was given IV TNKase  followed by successful mechanical thrombectomy and  TICI 3 revascularization of the right MCA by Dr. Alona.  He is kept in the ICU with close monitoring and made steady improvement.  MRI scan showed patchy acute infarcts on the right more than the left with trace petechial hemorrhage.  Carotid ultrasound showed 80 to 90% right ICA stenosis and left ICA stenosis difficult to measure due to severe/narrowing.  2D echo showed ejection fraction of 20 to 25% with entire apex being a kinetic.  Myocardium appeared thin.  LDL cholesterol was 77 mg percent hemoglobin A1c 5.0.  Patient a few days later  subsequent underwent elective right carotid artery angioplasty and stenting by Dr. Alona which was uneventful.  Started on Eliquis  for his A. fib and cardiomyopathy and as well as Plavix  for his stent.  Patient states is done well since discharge.  He is having some minor bruising but no bleeding complications.  Is tolerating Lipitor well without muscle aches and pains.  He does get some cramps at night but these are not bothersome.  He has followed up with cardiologist who has not increase the dose of Entresto  recently and did some follow-up labs and has scheduled an echocardiogram next month.  Patient is able to walk independently without shortness of breath.  Difficulty walking is mostly related to one leg being shorter than the other.  He is currently just finished his physical occupational therapy.  He has some diminished fine motor skills on the left and has trouble buttoning his shirts.  He has not yet followed up with Dr. Alona to consider left carotid artery revascularization.  He feels he is able to do most activities of daily living: Himself except he is walking with a cane and has some diminished fine motor skills. Update 01/26/2023 : He returns for follow-up after initial visit a year and a half ago.  He states he is doing well.  He has had no recurrent stroke or TIA symptoms.  Continues to take Eliquis  for his atrial fibrillation and Plavix  for his carotid stent and is tolerating both medications well with only minor bruising and no major bleeding episodes.  He remains on Lipitor which is tolerating  well without muscle aches and pains.  He states he had lipid profile checked in October by his primary care physician which was satisfactory but I did not have the results to review today.  He has been started on Aldactone  for hypertension but blood pressure remains high and today it is 151/71.  He does have an upcoming visit with PCP to discuss increasing his medications.  He has cut back eating salt and  eats healthy.  Did have follow-up carotid ultrasound on 07/13/2022 which showed patent right ICA stent without significant stenosis.  Stable 50 to 69% left ICA stenosis.  Patient continues to have mild left leg weakness and stiffness in right foot while walking.  He has quit smoking and drinking since his stroke.  He has no new complaints. ROS:   14 system review of systems is positive for weakness, leg cramps, muscle aches, difficulty walking, thigh pain, leg pain all other systems negative  PMH:  Past Medical History:  Diagnosis Date   Acute CVA (cerebrovascular accident) (HCC) 11/03/2020   Acute left-sided weakness 11/03/2020   Acute ST elevation myocardial infarction (STEMI) (HCC) 11/03/2020   a. s/p anterior STEMI in 10/2020 but not a candidate for invasive cath at that time given his acute CVA and having received TNK   Carotid artery disease [facility])    Cataract    Removed Dec 2017/Jan 2018   CHF (congestive heart failure) (HCC)    a. EF 20-25% by echo in 10/2020, similar results by repeat echo in 01/2021   COPD (chronic obstructive pulmonary disease) (HCC)    Coronary artery disease    Dyspnea    Essential hypertension    H. pylori infection 11/2016   With documented eradication via biopsy Feb 2019   Heart murmur    History of atrial fibrillation    November 2017   Hyperlipidemia    Peripheral vascular disease (HCC)    Sleep apnea    Did not tolerate CPAP   Tubulovillous adenoma of rectum 04/2010    Social History:  Social History   Socioeconomic History   Marital status: Married    Spouse name: Sales Executive   Number of children: 2   Years of education: GED   Highest education level: Not on file  Occupational History   Occupation: disability    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  Tobacco Use   Smoking status: Former    Current packs/day: 0.00    Average packs/day: 1 pack/day for 45.0 years (45.0 ttl pk-yrs)    Types: Cigarettes    Start date: 11/04/1975    Quit date:  11/03/2020    Years since quitting: 2.2   Smokeless tobacco: Never   Tobacco comments:    started back - pack per day  Vaping Use   Vaping status: Never Used  Substance and Sexual Activity   Alcohol use: Not Currently    Comment: none since 11/03/20   Drug use: No   Sexual activity: Yes  Other Topics Concern   Not on file  Social History Narrative   Lives with wife Norva   Leisure - couch potato   Retired    Social Drivers of Corporate Investment Banker Strain: Low Risk  (11/14/2020)   Overall Financial Resource Strain (CARDIA)    Difficulty of Paying Living Expenses: Not hard at all  Food Insecurity: No Food Insecurity (09/29/2021)   Hunger Vital Sign    Worried About Running Out of Food in the Last Year: Never true  Ran Out of Food in the Last Year: Never true  Transportation Needs: No Transportation Needs (09/29/2021)   PRAPARE - Administrator, Civil Service (Medical): No    Lack of Transportation (Non-Medical): No  Physical Activity: Inactive (11/14/2020)   Exercise Vital Sign    Days of Exercise per Week: 0 days    Minutes of Exercise per Session: 0 min  Stress: No Stress Concern Present (11/14/2020)   [employer] of Occupational Health - Occupational Stress Questionnaire    Feeling of Stress : Only a little  Social Connections: Not on file  Intimate Partner Violence: Not At Risk (11/14/2020)   Humiliation, Afraid, Rape, and Kick questionnaire    Fear of Current or Ex-Partner: No    Emotionally Abused: No    Physically Abused: No    Sexually Abused: No    Medications:   Current Outpatient Medications on File Prior to Visit  Medication Sig Dispense Refill   atorvastatin  (LIPITOR) 40 MG tablet Take 1 tablet (40 mg total) by mouth daily. (Patient taking differently: Take 40 mg by mouth daily at 6 PM.) 90 tablet 1   Cholecalciferol  (VITAMIN D -3 PO) Take 1 tablet by mouth daily.     clopidogrel  (PLAVIX ) 75 MG tablet Take 1 tablet (75 mg total)  by mouth daily. 60 tablet 0   ELIQUIS  5 MG TABS tablet TAKE 1 TABLET BY MOUTH TWICE  DAILY 200 tablet 2   FARXIGA  10 MG TABS tablet TAKE 1 TABLET BY MOUTH DAILY BEFORE BREAKFAST. 90 tablet 3   metoprolol  succinate (TOPROL -XL) 25 MG 24 hr tablet TAKE 1 TABLET BY MOUTH DAILY (Patient taking differently: Take 50 mg by mouth daily.) 100 tablet 2   Omega-3 Fatty Acids (FISH OIL) 1000 MG CPDR Take 1 capsule by mouth 2 (two) times daily.     sacubitril -valsartan  (ENTRESTO ) 97-103 MG Take 1 tablet by mouth 2 (two) times daily. 180 tablet 2   spironolactone  (ALDACTONE ) 25 MG tablet Take 1 tablet (25 mg total) by mouth daily. 90 tablet 1   ipratropium (ATROVENT) 0.06 % nasal spray Place 1 spray into both nostrils as needed.     No current facility-administered medications on file prior to visit.    Allergies:  No Known Allergies  Physical Exam General: Mildly obese  middle-aged African-American male, seated, in no evident distress Head: head normocephalic and atraumatic.   Neck: supple with no carotid or supraclavicular bruits Cardiovascular: regular rate and rhythm, no murmurs Musculoskeletal: no deformity Skin:  no rash/petichiae Vascular:  Normal pulses all extremities  Neurologic Exam Mental Status: Awake and fully alert. Oriented to place and time. Recent and remote memory intact. Attention span, concentration and fund of knowledge appropriate. Mood and affect appropriate.  Cranial Nerves: Fundoscopic exam reveals sharp disc margins. Pupils equal, briskly reactive to light. Extraocular movements full without nystagmus. Visual fields full to confrontation. Hearing intact. Facial sensation intact.  Mild left lower facial asymmetry., tongue, palate moves normally and symmetrically.  Motor: Normal bulk and tone. Normal strength in all tested extremity muscles.  Slight stiffness of the left leg. Sensory.: intact to touch , pinprick , position and vibratory sensation.  Coordination: Rapid  alternating movements normal in all extremities. Finger-to-nose and heel-to-shin performed accurately bilaterally. Gait and Station: Arises from chair without difficulty. Stance is normal. Gait demonstrates normal stride length and balance but favors left leg as it is shorter than the right.. Able to heel, toe and tandem walk with moderate difficulty.  Reflexes: 1+  and symmetric. Toes downgoing.   NIHSS  1 Modified Rankin  2   ASSESSMENT: 68 year old African-American male with right MCA infarct secondary to right M1 occlusion due to embolization from high-grade proximal carotid stenosis as well as acute myocardial infarction treated with IV TNK followed by successful mechanical thrombectomy with TICI 3 revascularization.  Subsequent elective right proximal carotid angioplasty stenting done a few days later.  Vascular risk factors of coronary artery disease hypertension, hyperlipidemia, smoking, ischemic cardiomyopathy.  Patient is doing well without recurrent neurovascular symptoms.     PLANI: I  had a long d/w patient about his remote stroke, carotid stent and left carotid moderate stenosis, atrial fibrillatrion,risk for recurrent stroke/TIAs, personally independently reviewed imaging studies and stroke evaluation results and answered questions.Continue Eliquis  (apixaban ) daily  for secondary stroke prevention for his atrial fibrillation and Plavix  for his carotid stent and maintain strict control of hypertension with blood pressure goal below 130/90, diabetes with hemoglobin A1c goal below 6.5% and lipids with LDL cholesterol goal below 70 mg/dL. I also advised the patient to eat a healthy diet with plenty of whole grains, cereals, fruits and vegetables, exercise regularly and maintain ideal body weight .Check screening carotid ultrasound and if moderate left carotid stenosis has progressed may need carotid revascularization.  Return for follow-up in the future only as necessary and no scheduled  appointment was made.  Followup in the future with me in 1 year or call earlier if necessary.Greater than 50% time during this 35-minute visit were spent on counseling and coordination of care about his recent stroke and carotid stenosis and revascularization and answering questions Eather Popp, MD  Note: This document was prepared with digital dictation and possible smart phrase technology. Any transcriptional errors that result from this process are unintentional.

## 2023-02-02 ENCOUNTER — Ambulatory Visit (INDEPENDENT_AMBULATORY_CARE_PROVIDER_SITE_OTHER): Payer: 59

## 2023-02-02 DIAGNOSIS — I255 Ischemic cardiomyopathy: Secondary | ICD-10-CM | POA: Diagnosis not present

## 2023-02-02 LAB — CUP PACEART REMOTE DEVICE CHECK
Battery Remaining Longevity: 105 mo
Battery Voltage: 3.02 V
Brady Statistic AP VP Percent: 0.17 %
Brady Statistic AP VS Percent: 70.47 %
Brady Statistic AS VP Percent: 0.01 %
Brady Statistic AS VS Percent: 29.35 %
Brady Statistic RA Percent Paced: 70.38 %
Brady Statistic RV Percent Paced: 0.17 %
Date Time Interrogation Session: 20250114033322
HighPow Impedance: 59 Ohm
Implantable Lead Connection Status: 753985
Implantable Lead Connection Status: 753985
Implantable Lead Implant Date: 20230714
Implantable Lead Implant Date: 20230714
Implantable Lead Location: 753859
Implantable Lead Location: 753860
Implantable Lead Model: 5076
Implantable Pulse Generator Implant Date: 20230714
Lead Channel Impedance Value: 304 Ohm
Lead Channel Impedance Value: 361 Ohm
Lead Channel Impedance Value: 399 Ohm
Lead Channel Pacing Threshold Amplitude: 0.5 V
Lead Channel Pacing Threshold Amplitude: 0.5 V
Lead Channel Pacing Threshold Pulse Width: 0.4 ms
Lead Channel Pacing Threshold Pulse Width: 0.4 ms
Lead Channel Sensing Intrinsic Amplitude: 14.875 mV
Lead Channel Sensing Intrinsic Amplitude: 14.875 mV
Lead Channel Sensing Intrinsic Amplitude: 3.75 mV
Lead Channel Sensing Intrinsic Amplitude: 3.75 mV
Lead Channel Setting Pacing Amplitude: 2 V
Lead Channel Setting Pacing Amplitude: 2.5 V
Lead Channel Setting Pacing Pulse Width: 0.4 ms
Lead Channel Setting Sensing Sensitivity: 0.3 mV
Zone Setting Status: 755011

## 2023-02-10 ENCOUNTER — Ambulatory Visit: Payer: 59 | Attending: Neurology

## 2023-02-10 DIAGNOSIS — I6522 Occlusion and stenosis of left carotid artery: Secondary | ICD-10-CM

## 2023-02-17 NOTE — Progress Notes (Signed)
Kindly inform the patient that carotid ultrasound shows slight worsening of the mild right ICA and moderate left ICA stenosis compared with previous ultrasound 6 months ago.  Continue medical management for now but repeat ultrasound in 6 months

## 2023-02-18 ENCOUNTER — Other Ambulatory Visit: Payer: Self-pay | Admitting: *Deleted

## 2023-02-18 DIAGNOSIS — I10 Essential (primary) hypertension: Secondary | ICD-10-CM | POA: Diagnosis not present

## 2023-02-18 DIAGNOSIS — I5022 Chronic systolic (congestive) heart failure: Secondary | ICD-10-CM | POA: Diagnosis not present

## 2023-02-18 DIAGNOSIS — I480 Paroxysmal atrial fibrillation: Secondary | ICD-10-CM | POA: Diagnosis not present

## 2023-02-18 DIAGNOSIS — I700 Atherosclerosis of aorta: Secondary | ICD-10-CM | POA: Diagnosis not present

## 2023-02-18 DIAGNOSIS — Z0000 Encounter for general adult medical examination without abnormal findings: Secondary | ICD-10-CM | POA: Diagnosis not present

## 2023-02-18 DIAGNOSIS — I6522 Occlusion and stenosis of left carotid artery: Secondary | ICD-10-CM

## 2023-02-18 DIAGNOSIS — G4739 Other sleep apnea: Secondary | ICD-10-CM | POA: Diagnosis not present

## 2023-02-26 ENCOUNTER — Encounter: Payer: Self-pay | Admitting: *Deleted

## 2023-02-26 NOTE — Patient Outreach (Signed)
 Erroneous encounter

## 2023-03-08 DIAGNOSIS — L110 Acquired keratosis follicularis: Secondary | ICD-10-CM | POA: Diagnosis not present

## 2023-03-08 DIAGNOSIS — M79675 Pain in left toe(s): Secondary | ICD-10-CM | POA: Diagnosis not present

## 2023-03-08 DIAGNOSIS — M79672 Pain in left foot: Secondary | ICD-10-CM | POA: Diagnosis not present

## 2023-03-08 DIAGNOSIS — M79674 Pain in right toe(s): Secondary | ICD-10-CM | POA: Diagnosis not present

## 2023-03-08 DIAGNOSIS — M79671 Pain in right foot: Secondary | ICD-10-CM | POA: Diagnosis not present

## 2023-03-08 DIAGNOSIS — I739 Peripheral vascular disease, unspecified: Secondary | ICD-10-CM | POA: Diagnosis not present

## 2023-03-11 ENCOUNTER — Encounter: Payer: Self-pay | Admitting: *Deleted

## 2023-03-11 DIAGNOSIS — I480 Paroxysmal atrial fibrillation: Secondary | ICD-10-CM | POA: Diagnosis not present

## 2023-03-11 DIAGNOSIS — G4739 Other sleep apnea: Secondary | ICD-10-CM | POA: Diagnosis not present

## 2023-03-11 DIAGNOSIS — I5022 Chronic systolic (congestive) heart failure: Secondary | ICD-10-CM | POA: Diagnosis not present

## 2023-03-11 DIAGNOSIS — I700 Atherosclerosis of aorta: Secondary | ICD-10-CM | POA: Diagnosis not present

## 2023-03-11 DIAGNOSIS — I10 Essential (primary) hypertension: Secondary | ICD-10-CM | POA: Diagnosis not present

## 2023-03-25 ENCOUNTER — Other Ambulatory Visit: Payer: Self-pay | Admitting: Cardiology

## 2023-03-26 NOTE — Progress Notes (Signed)
 Remote ICD transmission.

## 2023-03-30 ENCOUNTER — Encounter: Payer: Self-pay | Admitting: Cardiology

## 2023-03-30 ENCOUNTER — Ambulatory Visit: Payer: 59 | Attending: Cardiology | Admitting: Cardiology

## 2023-04-02 ENCOUNTER — Ambulatory Visit: Payer: 59 | Admitting: Cardiology

## 2023-04-08 ENCOUNTER — Ambulatory Visit: Payer: Self-pay | Admitting: *Deleted

## 2023-04-08 ENCOUNTER — Encounter: Payer: Self-pay | Admitting: *Deleted

## 2023-04-08 NOTE — Patient Outreach (Signed)
 Care Coordination   Follow Up Visit Note   04/08/2023 Name: Christopher Lang MRN: [id number] DOB: 03/16/1955  Christopher Lang is a 68 y.o. year old male who sees Hasanaj, Myra Gianotti, MD for primary care. I spoke with  Christopher Lang by phone today.  What matters to the patients health and wellness today?  Rescheduling cardiology appointment    Goals Addressed             This Visit's Progress    Care Management Needs       Care Management Goals: Patient will talk with [facility] to reschedule 6 month F/U with Dr Diona Browner Patient will remain active and will increase activity level as tolerated with an ultimate goal of 150 minutes of moderate exercise each week Patient will reach out to RN Care Manager at [phone] as needed        SDOH assessments and interventions completed:  Yes  SDOH Interventions Today    Flowsheet Row Most Recent Value  SDOH Interventions   Transportation Interventions Intervention Not Indicated        Care Coordination Interventions:  Yes, provided  Interventions Today    Flowsheet Row Most Recent Value  Chronic Disease   Chronic disease during today's visit Hypertension (HTN), Atrial Fibrillation (AFib), Other  [h/o of MI and CVA]  General Interventions   General Interventions Discussed/Reviewed General Interventions Discussed, General Interventions Reviewed, Doctor Visits, Communication with  Doctor Visits Discussed/Reviewed Doctor Visits Discussed, Doctor Visits Reviewed, Specialist, PCP  PCP/Specialist Visits Compliance with follow-up visit  [reschedule follow-up with cardiologist]  Communication with PCP/Specialists  [QMVHQ message sent to [facility] Scheduling pool requesting outreach to patient to reschedule 6 month F/U with Dr Diona Browner. Patient had to cancel appointment earlier this month due to illness.]  Exercise Interventions   Exercise Discussed/Reviewed Physical Activity, Exercise Reviewed, Exercise Discussed  [walking  daily and able to perform ADLs independently]  Physical Activity Discussed/Reviewed Physical Activity Discussed, Physical Activity Reviewed  Education Interventions   Education Provided Provided Education  Provided Verbal Education On When to see the doctor, Exercise, Medication, Other  [blood pressure monitoring]  Nutrition Interventions   Nutrition Discussed/Reviewed Nutrition Discussed, Nutrition Reviewed, Fluid intake, Portion sizes, Decreasing salt  Pharmacy Interventions   Pharmacy Dicussed/Reviewed Pharmacy Topics Discussed, Pharmacy Topics Reviewed, Medications and their functions  [taking medications as prescribed. No concerns.]  Safety Interventions   Safety Discussed/Reviewed Safety Discussed, Safety Reviewed       Follow up plan: Follow up call scheduled for 05/10/23    Encounter Outcome:  Patient Visit Completed   Demetrios Loll, RN, BSN [place]  [place], [facility] RN Care Manager Direct Dial: [phone]

## 2023-04-19 IMAGING — XA IR INTRAVSC STENT CERV CAROTID W/ EMB-PROT
9 of 13 series · 10 of 24 positions shown · non-contrast
Comparison: none

INDICATION: 65-year-old male with a history of symptomatic high-grade right
carotid stenosis, high risk for surgery given cardiac issues
TECHNIQUE: Informed written consent was obtained from the patient and the
patient's family after a thorough discussion of the procedural
risks, benefits and alternatives. Specific risks discussed include:
Bleeding, infection, contrast reaction, kidney injury/failure, need
for further procedure/surgery, arterial injury or dissection,
embolization to new territory, revascularization syndrome,
neurologic deterioration, cardiopulmonary collapse, death. All
questions were addressed. Maximal Sterile Barrier Technique was
utilized including during the procedure including caps, mask,
sterile gowns, sterile gloves, sterile drape, hand hygiene and skin
antiseptic. A timeout was performed prior to the initiation of the
procedure.

[Series 1: cerebral · 2 acquisitions, 1 frame shown (1 of 8)]
[im 1/2]
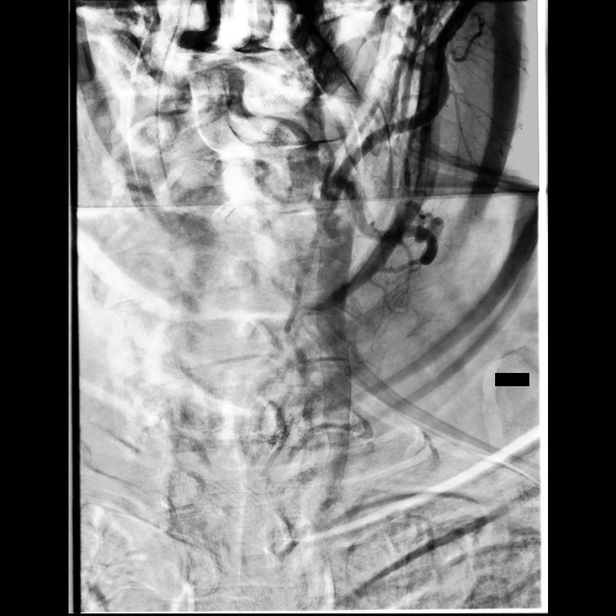

[Series 2: cerebral · 2 acquisitions, 1 frame shown (2 of 8)]
[im 1/2]
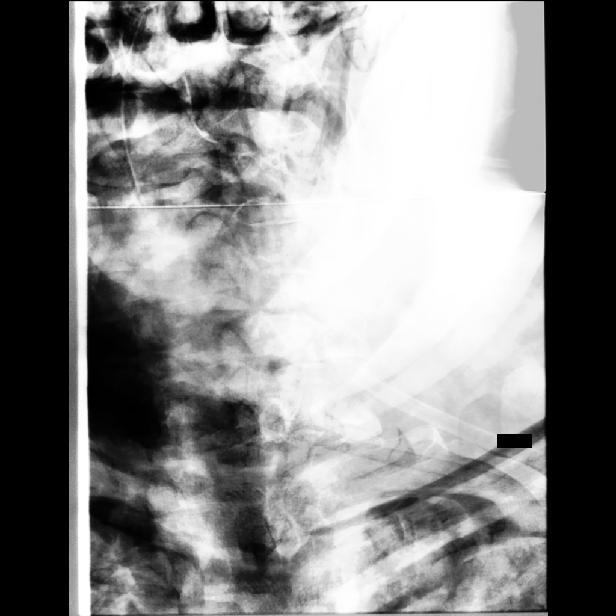

[Series 4: cerebral · 2 acquisitions, 1 frame shown (3 of 8)]
[im 1/2]
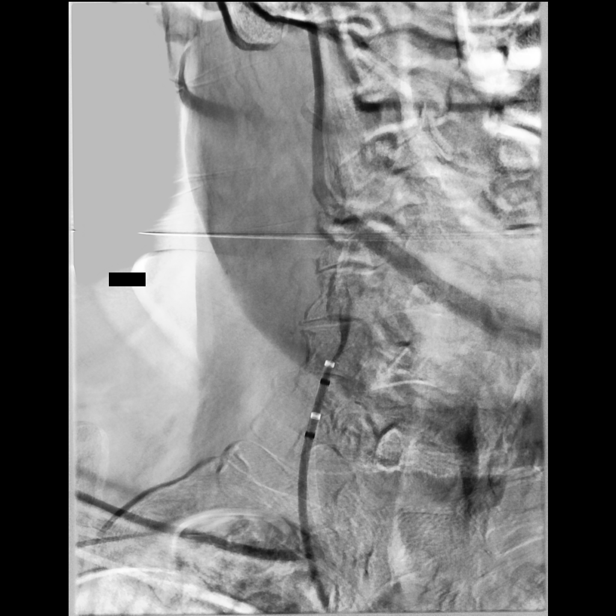

[Series 6: cerebral · 2 acquisitions, 1 frame shown (4 of 8)]
[im 1/2]
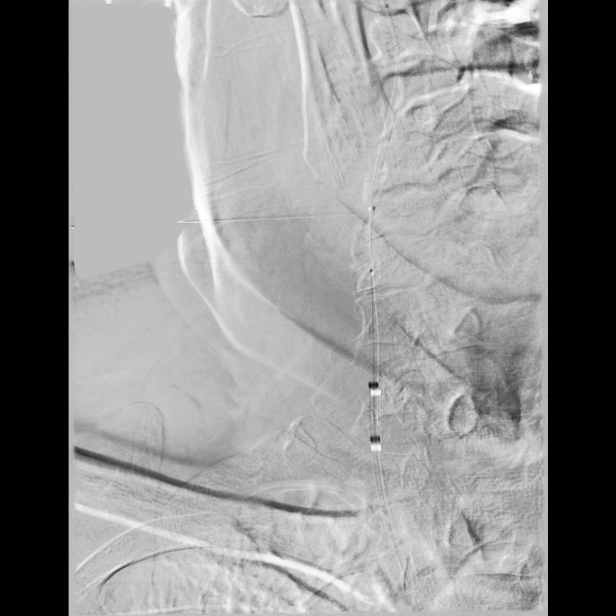

[Series 7: cerebral · 2 acquisitions, 1 frame shown (5 of 8)]
[im 1/2]
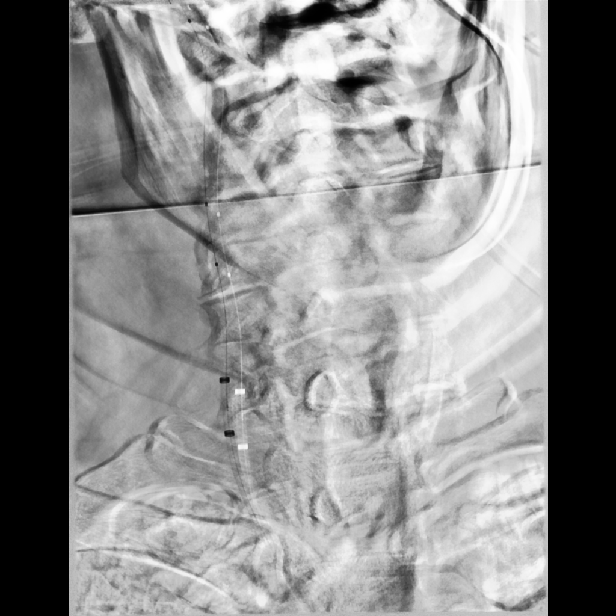

[Series 11: cerebral · 2 acquisitions, 1 frame shown (6 of 8)]
[im 1/2]
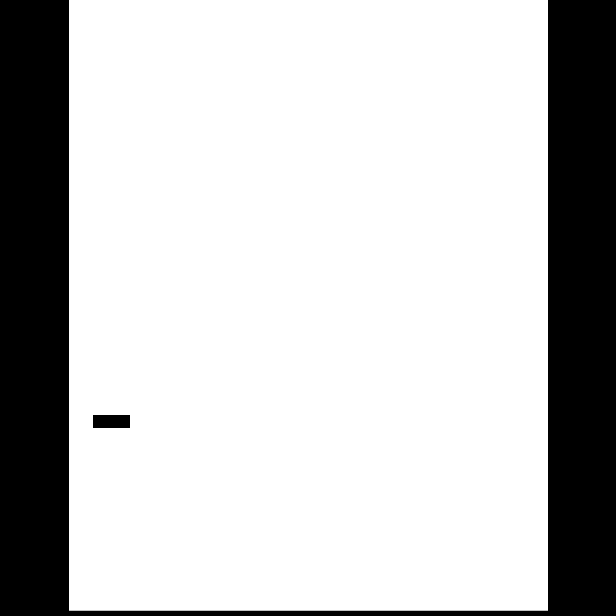

[Series 12: cerebral · 2 acquisitions, 1 frame shown (7 of 8)]
[im 1/2]
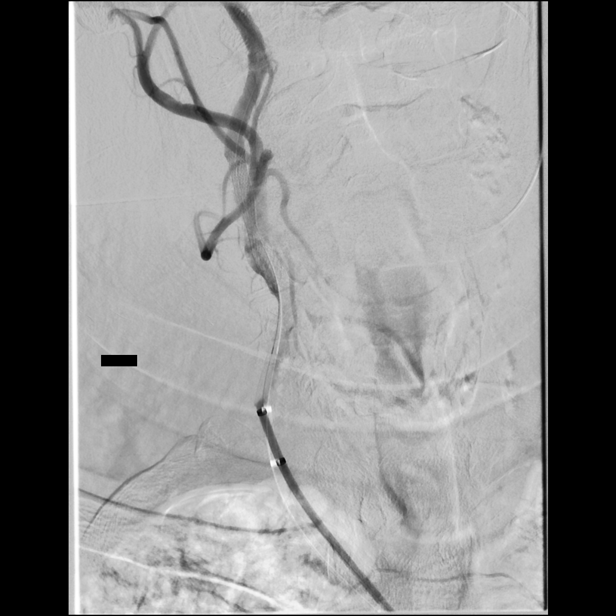

[Series 14: cerebral · 2 acquisitions, 1 frame shown (8 of 8)]
[im 1/2]
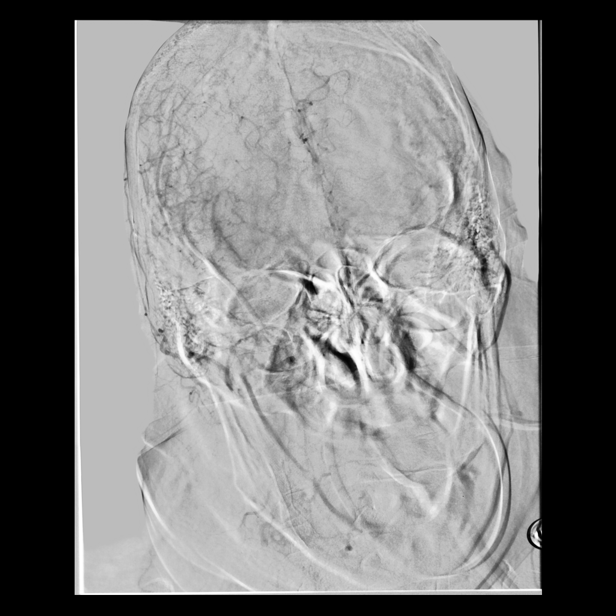

[Series 300: dr. (person_name) · 2 of 24 slices shown]
[im 6/24]
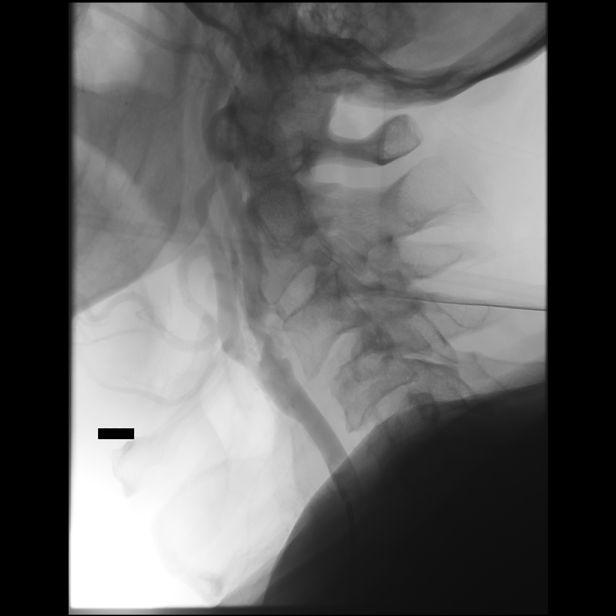
[im 18/24]
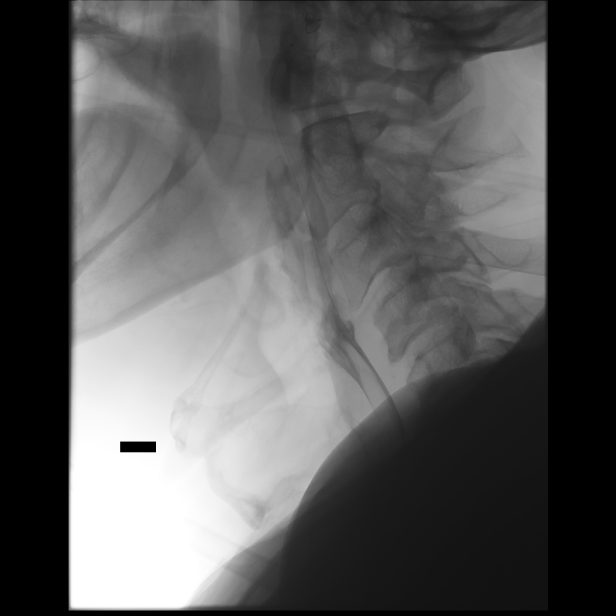

[10 of 24 positions shown; findings below may reference images not displayed]

EXAM:
ULTRASOUND-GUIDED ACCESS RIGHT COMMON FEMORAL ARTERY

RIGHT CAROTID STENT WITH EMBOLIC PROTECTION

MEDICATIONS:
8333 UNITS IV HEPARIN.

300 mg p.o. Plavix

ANESTHESIA/SEDATION:
The anesthesia team was present to provide MAC anesthesia and for
patient monitoring during the procedure. Left radial arterial line
was performed by the anesthesia team. Interventional neuro radiology
nursing staff was also present.

FLUOROSCOPY TIME:  Fluoroscopy Time: 6 minutes 42 seconds (491 mGy).

COMPLICATIONS:
None
The anesthesia team was present to provide general endotracheal tube
anesthesia and for patient monitoring during the procedure.
Interventional neuro radiology nursing staff was also present.

PROCEDURE:
After the anesthesia team initiated general anesthesia, a time-out
was performed. The patient is prepped and draped in the usual
sterile fashion, [HOSPITAL] in the neuro IR suite.

Ultrasound survey of the right inguinal region was performed with
images stored and sent to PACs. Ultrasound demonstrates patent right
common femoral artery.

11 blade scalpel was used to make a small incision. Blunt dissection
was performed with ultrasound guidance. A micropuncture needle was
used access the right common femoral artery under ultrasound. With
excellent arterial blood flow returned, an .018 micro wire was
passed through the needle, observed to enter the abdominal aorta
under fluoroscopy. The needle was removed, and a micropuncture
sheath was placed over the wire. The inner dilator and wire were
removed, and an 035 Bentson wire was advanced under fluoroscopy into
the abdominal aorta. The sheath was removed and a standard 5 French
vascular sheath was placed. The dilator was removed and the sheath
was flushed.

A 5F JB-1 diagnostic catheter was advanced over the wire to the
proximal descending thoracic aorta. Wire was then removed. Double
flush of the catheter was performed. We then attempted four-vessel
cerebral angiogram. Given that the patient had a sub-optimal
response to MAC anesthesia, and his heart precluded general
anesthesia, we elected to abort a complete 4 vessel angiogram.

Angiogram of the left cervical carotid system was performed to
document status of the right ICA at the bifurcation.

Images were reviewed.

We then proceeded with our treatment plan. 8333 units of IV heparin
was administered.

The JB 1 catheter was used to select the innominate artery, directed
then into the right common carotid artery. With an adequate catheter
position in the common carotid artery, roadmap angiogram was
performed. The catheter was then navigated with the assistance of a
Glidewire into the external carotid artery branches. Wire was
removed and a exchange length wire was placed.

Catheter was removed on the Rosen wire.

The 5 French sheath was then removed and exchanged for 8 French 55
centimeter BrightTip sheath. Sheath was flushed and attached to
pressurized and heparinized saline bag for constant forward flow.
Then an 8 French, 95 cm wall wrist balloon tip catheter was prepared
on the back table with inflation of the balloon with 50/50
concentration of dilute contrast. The balloon catheter was then
advanced over the wire, positioned into the distal right common
carotid artery.

Copious back flush was performed and the balloon catheter was
attached to heparinized and pressurized saline bag for forward flow.

Angiogram was then performed. After review of the images and
measurements, we elected to proceed with distal embolic protection
using Nav 6 device, 4 - 7mm size. We selected an 9-7 x 40 mm Xact
stent, with a plan for pre-dilation 5 mm x 20 mm balloon angioplasty
with a Viatrac balloon. These devices were prepped on the back
table, including the retrieval device of the Nav 6.

Confirmation angiogram was performed for road map guide.

The bare wire on the NAV6 was advanced through the stenotic region
of the ICA, into a distal position within the cervical ICA. The
Wav-S device was then advanced into a safe location above the
carotid lesion with deployment of the Nav 6. Timer was started, and
a gentle infusion of contrast confirmed forward flow.

The deployment device was removed on the bare wire and was disposed.
5 mm balloon angioplasty was then performed. 10 atmospheres was
observed. The patient remained hemodynamically stable throughout
this maneuver. Balloon was removed and disposed. The selected stent
was then deployed at the lesion with the deployment device removed
from the bare wire and disposed.

Angiogram was performed.

The retrieval device was then passed over the bare wire and the Nav
6 embolic protection device was retrieved. The entire unit was
removed from the balloon guide catheter under fluoroscopic
observation as this traversed the new stent. Less than 9 minutes of
embolic protection open time.

Angiogram was performed.

We then observed 5 minutes interval and a final angiogram was
performed.

The 55 centimeter 8 French sheath was removed and 7 French Celt was
deployed.

Patient tolerated the procedure well and remained hemodynamically
stable throughout.

No complications were encountered and no significant blood loss
encountered.
FINDINGS: Initial Findings:

Left common carotid artery: Normal course caliber and contour with
independent origin from the aortic arch.

Left external carotid artery: Patent with antegrade flow with
moderate atherosclerosis at the origin.

Left internal carotid artery: Circumferential calcifications with
high-grade stenosis at the origin corresponding to the duplex exam
that was recently performed of [DATE]% stenosis, on the high end.
Slow flow through the distal cervical segment of the ICA. Diagnostic
images of the distal cervical segment and of the intracranial
portion were not capable given the patient's suboptimal response to
the anesthesia and motion artifact.

Right common carotid artery:  Normal course caliber and contour.

Right external carotid artery: Patent with antegrade flow.

Right internal carotid artery: Circumferential calcific plaque at
the proximal right ICA, with irregular margins and greater than 50%
residual stenosis after the previous treatment at the time of the
stroke treatment.

Completion:

After angioplasty and stenting there is approximately 30% residual
stenosis.

Right MCA: M1 segment patent. Insular and opercular segments patent.
Unremarkable caliber and course of the cortical segments. Typical
arterial, capillary/ parenchymal, and venous phase.

Right ACA: A 1 segment patent. A 2 segment perfuses the right
territory. Patent anterior communicating artery rapidly fills the
left hemispheric MCA branches
IMPRESSION: Status post ultrasound guided access right common femoral artery for
right cervical ICA stenting with embolic protection, for treatment
of high-grade symptomatic stenosis, high risk for surgery.

Angiogram demonstrates a left cervical ICA critical stenosis.

## 2023-05-04 ENCOUNTER — Ambulatory Visit: Payer: Medicare Other

## 2023-05-04 DIAGNOSIS — I255 Ischemic cardiomyopathy: Secondary | ICD-10-CM | POA: Diagnosis not present

## 2023-05-06 LAB — CUP PACEART REMOTE DEVICE CHECK
Battery Remaining Longevity: 102 mo
Battery Voltage: 3.02 V
Brady Statistic AP VP Percent: 0.26 %
Brady Statistic AP VS Percent: 60.35 %
Brady Statistic AS VP Percent: 0.01 %
Brady Statistic AS VS Percent: 39.38 %
Brady Statistic RA Percent Paced: 60.37 %
Brady Statistic RV Percent Paced: 0.26 %
Date Time Interrogation Session: 20250415043724
HighPow Impedance: 65 Ohm
Implantable Lead Connection Status: 753985
Implantable Lead Connection Status: 753985
Implantable Lead Implant Date: 20230714
Implantable Lead Implant Date: 20230714
Implantable Lead Location: 753859
Implantable Lead Location: 753860
Implantable Lead Model: 5076
Implantable Pulse Generator Implant Date: 20230714
Lead Channel Impedance Value: 342 Ohm
Lead Channel Impedance Value: 399 Ohm
Lead Channel Impedance Value: 399 Ohm
Lead Channel Pacing Threshold Amplitude: 0.375 V
Lead Channel Pacing Threshold Amplitude: 0.625 V
Lead Channel Pacing Threshold Pulse Width: 0.4 ms
Lead Channel Pacing Threshold Pulse Width: 0.4 ms
Lead Channel Sensing Intrinsic Amplitude: 17.75 mV
Lead Channel Sensing Intrinsic Amplitude: 17.75 mV
Lead Channel Sensing Intrinsic Amplitude: 3.875 mV
Lead Channel Sensing Intrinsic Amplitude: 3.875 mV
Lead Channel Setting Pacing Amplitude: 2 V
Lead Channel Setting Pacing Amplitude: 2.5 V
Lead Channel Setting Pacing Pulse Width: 0.4 ms
Lead Channel Setting Sensing Sensitivity: 0.3 mV
Zone Setting Status: 755011

## 2023-05-07 DIAGNOSIS — I5022 Chronic systolic (congestive) heart failure: Secondary | ICD-10-CM | POA: Diagnosis not present

## 2023-05-07 DIAGNOSIS — I10 Essential (primary) hypertension: Secondary | ICD-10-CM | POA: Diagnosis not present

## 2023-05-10 ENCOUNTER — Encounter: Payer: Self-pay | Admitting: *Deleted

## 2023-05-13 DIAGNOSIS — H3554 Dystrophies primarily involving the retinal pigment epithelium: Secondary | ICD-10-CM | POA: Diagnosis not present

## 2023-05-13 DIAGNOSIS — H35462 Secondary vitreoretinal degeneration, left eye: Secondary | ICD-10-CM | POA: Diagnosis not present

## 2023-05-13 DIAGNOSIS — H40023 Open angle with borderline findings, high risk, bilateral: Secondary | ICD-10-CM | POA: Diagnosis not present

## 2023-05-18 ENCOUNTER — Telehealth: Payer: Self-pay

## 2023-05-18 ENCOUNTER — Ambulatory Visit: Attending: Cardiology | Admitting: Cardiology

## 2023-05-18 ENCOUNTER — Encounter: Payer: Self-pay | Admitting: Cardiology

## 2023-05-18 VITALS — BP 124/72 | HR 74 | Ht 72.0 in | Wt 207.2 lb

## 2023-05-18 DIAGNOSIS — I480 Paroxysmal atrial fibrillation: Secondary | ICD-10-CM

## 2023-05-18 DIAGNOSIS — I5020 Unspecified systolic (congestive) heart failure: Secondary | ICD-10-CM | POA: Diagnosis not present

## 2023-05-18 DIAGNOSIS — I6523 Occlusion and stenosis of bilateral carotid arteries: Secondary | ICD-10-CM | POA: Diagnosis not present

## 2023-05-18 DIAGNOSIS — E782 Mixed hyperlipidemia: Secondary | ICD-10-CM | POA: Diagnosis not present

## 2023-05-18 NOTE — Progress Notes (Signed)
 Complex Care Management Note Care Guide Note  05/20/2023 Name: Christopher Lang MRN: [id number] DOB: March 03, 1955   Complex Care Management Outreach Attempts: A second unsuccessful outreach was attempted today to offer the patient with information about available complex care management services.  Follow Up Plan:  No further outreach attempts will be made at this time. We have been unable to contact the patient to offer or enroll patient in complex care management services.  Encounter Outcome:  No Answer  [facility]  [facility], [facility] Care Management Assistant Direct Dial: [phone]  Fax: [phone]

## 2023-05-18 NOTE — Progress Notes (Addendum)
    Cardiology Office Note  Date: 05/18/2023   ID: Christopher Lang Feb 15, 1955, MRN [id number]  History of Present Illness: Christopher Lang is a 68 y.o. male last seen in September 2024.  He is here for a routine visit.  Reports NYHA class II dyspnea, no fluid retention, no orthopnea or PND, no angina with typical activities.  Medtronic ICD in place with follow-up by Dr. Carolynne Citron.  Device interrogation in April indicated normal function.  He reports no device shocks or syncope.  We went over his medications.  Indicates compliance with therapy, no obvious intolerances.  No spontaneous bleeding problems on Eliquis  and Plavix .  He is due for lab work with PCP soon.  We discussed getting an updated echocardiogram.  Physical Exam: VS:  BP 124/72   Pulse 74   Ht 6' (1.829 m)   Wt 207 lb 3.2 oz (94 kg)   SpO2 94%   BMI 28.10 kg/m , BMI Body mass index is 28.1 kg/m.  Wt Readings from Last 3 Encounters:  05/18/23 207 lb 3.2 oz (94 kg)  01/26/23 210 lb 3.2 oz (95.3 kg)  11/03/22 200 lb 6.4 oz (90.9 kg)    General: Patient appears comfortable at rest. HEENT: Conjunctiva and lids normal. Neck: Supple, no elevated JVP, bilateral carotid bruits. Lungs: Clear to auscultation, nonlabored breathing at rest. Cardiac: Regular rate and rhythm, no S3 or significant systolic murmur. Extremities: No pitting edema.  ECG:  An ECG dated 09/23/2022 was personally reviewed today and demonstrated:  Sinus rhythm with old anterior infarct pattern and nonspecific ST changes.  Labwork:  February 2024: BUN 13; Creatinine, Ser 0.88; Potassium 3.8; Sodium 143   Other Studies Reviewed Today:  No interval cardiac testing for review today.  Assessment and Plan:  1.  HFrEF with ischemic cardiomyopathy, LVEF 33% by cardiac MRI in April 2023 with nonviable inferior infarct scar.  Follow-up echocardiogram in March 2024 revealed LVEF 35 to 40% range.  Clinically stable with NYHA class II dyspnea and no fluid  retention.  We will update echocardiogram.  Continue medical therapy including Farxiga  10 mg daily, Toprol -XL 50 mg daily, Entresto  97/103 mg twice daily, and Aldactone  25 mg daily.  Follow-up lab work pending with PCP.   2.  Paroxysmal atrial fibrillation with CHA2DS2-VASc score of 6.  Asymptomatic without palpitations.  Continue Eliquis  5 mg twice daily for stroke prophylaxis.  3.  Medtronic ICD in place with follow-up by Dr. Carolynne Citron.  No device shocks or syncope.   4.  Bilateral carotid artery disease status post right ICA stent intervention.  Carotid Dopplers in January indicated 40 to 59% RICA stenosis and 60 to 79% LICA stenosis.  He remains on Plavix  75 mg daily.   5.  CAD with history of previous anterior wall infarct and October 2022 managed medically (invasive testing not pursued at that point since he had received TNK in the setting of stroke).  No angina reported on medical therapy.   6.  Mixed hyperlipidemia, last LDL 59 in March 2023.  Continue Lipitor 40 mg daily.  Follow-up lab work pending with PCP.  Disposition:  Follow up  6 months.  Signed, Gerard Knight, M.D., F.A.C.C. [name]

## 2023-05-18 NOTE — Patient Instructions (Addendum)

## 2023-05-18 NOTE — Telephone Encounter (Signed)
-----   Message from Nurse Deloras Fess sent at 05/10/2023  1:37 PM EDT ----- Please contact to reschedule.

## 2023-05-24 DIAGNOSIS — I5022 Chronic systolic (congestive) heart failure: Secondary | ICD-10-CM | POA: Diagnosis not present

## 2023-05-24 DIAGNOSIS — I480 Paroxysmal atrial fibrillation: Secondary | ICD-10-CM | POA: Diagnosis not present

## 2023-05-24 DIAGNOSIS — Z0000 Encounter for general adult medical examination without abnormal findings: Secondary | ICD-10-CM | POA: Diagnosis not present

## 2023-05-24 DIAGNOSIS — G4739 Other sleep apnea: Secondary | ICD-10-CM | POA: Diagnosis not present

## 2023-05-24 DIAGNOSIS — I10 Essential (primary) hypertension: Secondary | ICD-10-CM | POA: Diagnosis not present

## 2023-05-24 DIAGNOSIS — I700 Atherosclerosis of aorta: Secondary | ICD-10-CM | POA: Diagnosis not present

## 2023-06-03 ENCOUNTER — Ambulatory Visit: Payer: Self-pay | Admitting: Cardiology

## 2023-06-03 ENCOUNTER — Ambulatory Visit: Attending: Cardiology

## 2023-06-03 DIAGNOSIS — I5020 Unspecified systolic (congestive) heart failure: Secondary | ICD-10-CM

## 2023-06-03 LAB — ECHOCARDIOGRAM COMPLETE
AR max vel: 1.47 cm2
AV Area VTI: 1.6 cm2
AV Area mean vel: 1.7 cm2
AV Mean grad: 7 mmHg
AV Peak grad: 12.8 mmHg
Ao pk vel: 1.79 m/s
Area-P 1/2: 2.95 cm2
Calc EF: 37.4 %
MV VTI: 1.17 cm2
S' Lateral: 3 cm
Single Plane A2C EF: 32.9 %
Single Plane A4C EF: 42.5 %

## 2023-06-03 MED ORDER — PERFLUTREN LIPID MICROSPHERE
1.0000 mL | INTRAVENOUS | Status: AC | PRN
  Administered 2023-06-03: 3 mL via INTRAVENOUS

## 2023-06-04 DIAGNOSIS — I5022 Chronic systolic (congestive) heart failure: Secondary | ICD-10-CM | POA: Diagnosis not present

## 2023-06-04 DIAGNOSIS — I10 Essential (primary) hypertension: Secondary | ICD-10-CM | POA: Diagnosis not present

## 2023-06-07 DIAGNOSIS — M79674 Pain in right toe(s): Secondary | ICD-10-CM | POA: Diagnosis not present

## 2023-06-07 DIAGNOSIS — L110 Acquired keratosis follicularis: Secondary | ICD-10-CM | POA: Diagnosis not present

## 2023-06-07 DIAGNOSIS — M79672 Pain in left foot: Secondary | ICD-10-CM | POA: Diagnosis not present

## 2023-06-07 DIAGNOSIS — I739 Peripheral vascular disease, unspecified: Secondary | ICD-10-CM | POA: Diagnosis not present

## 2023-06-07 DIAGNOSIS — M79671 Pain in right foot: Secondary | ICD-10-CM | POA: Diagnosis not present

## 2023-06-16 NOTE — Telephone Encounter (Signed)
 Phone # updated, please call on [phone]

## 2023-06-18 NOTE — Addendum Note (Signed)
 Addended by: Lott Rouleau A on: 06/18/2023 02:56 PM   Modules accepted: Orders

## 2023-06-18 NOTE — Progress Notes (Signed)
 Remote ICD transmission.

## 2023-07-16 DIAGNOSIS — I10 Essential (primary) hypertension: Secondary | ICD-10-CM | POA: Diagnosis not present

## 2023-07-16 DIAGNOSIS — I5022 Chronic systolic (congestive) heart failure: Secondary | ICD-10-CM | POA: Diagnosis not present

## 2023-08-03 ENCOUNTER — Ambulatory Visit: Payer: Medicare Other

## 2023-08-03 DIAGNOSIS — I255 Ischemic cardiomyopathy: Secondary | ICD-10-CM | POA: Diagnosis not present

## 2023-08-04 LAB — CUP PACEART REMOTE DEVICE CHECK
Battery Remaining Longevity: 99 mo
Battery Voltage: 3.01 V
Brady Statistic AP VP Percent: 0.45 %
Brady Statistic AP VS Percent: 67.65 %
Brady Statistic AS VP Percent: 0.01 %
Brady Statistic AS VS Percent: 31.89 %
Brady Statistic RA Percent Paced: 67.3 %
Brady Statistic RV Percent Paced: 0.45 %
Date Time Interrogation Session: 20250715091604
HighPow Impedance: 61 Ohm
Implantable Lead Connection Status: 753985
Implantable Lead Connection Status: 753985
Implantable Lead Implant Date: 20230714
Implantable Lead Implant Date: 20230714
Implantable Lead Location: 753859
Implantable Lead Location: 753860
Implantable Lead Model: 5076
Implantable Pulse Generator Implant Date: 20230714
Lead Channel Impedance Value: 304 Ohm
Lead Channel Impedance Value: 399 Ohm
Lead Channel Impedance Value: 399 Ohm
Lead Channel Pacing Threshold Amplitude: 0.5 V
Lead Channel Pacing Threshold Amplitude: 0.625 V
Lead Channel Pacing Threshold Pulse Width: 0.4 ms
Lead Channel Pacing Threshold Pulse Width: 0.4 ms
Lead Channel Sensing Intrinsic Amplitude: 18.625 mV
Lead Channel Sensing Intrinsic Amplitude: 18.625 mV
Lead Channel Sensing Intrinsic Amplitude: 4.75 mV
Lead Channel Sensing Intrinsic Amplitude: 4.75 mV
Lead Channel Setting Pacing Amplitude: 2 V
Lead Channel Setting Pacing Amplitude: 2.5 V
Lead Channel Setting Pacing Pulse Width: 0.4 ms
Lead Channel Setting Sensing Sensitivity: 0.3 mV
Zone Setting Status: 755011

## 2023-08-09 ENCOUNTER — Ambulatory Visit: Payer: Self-pay | Admitting: Internal Medicine

## 2023-08-13 DIAGNOSIS — I5022 Chronic systolic (congestive) heart failure: Secondary | ICD-10-CM | POA: Diagnosis not present

## 2023-08-13 DIAGNOSIS — I10 Essential (primary) hypertension: Secondary | ICD-10-CM | POA: Diagnosis not present

## 2023-08-30 DIAGNOSIS — I5022 Chronic systolic (congestive) heart failure: Secondary | ICD-10-CM | POA: Diagnosis not present

## 2023-08-30 DIAGNOSIS — I480 Paroxysmal atrial fibrillation: Secondary | ICD-10-CM | POA: Diagnosis not present

## 2023-08-30 DIAGNOSIS — M25552 Pain in left hip: Secondary | ICD-10-CM | POA: Diagnosis not present

## 2023-08-30 DIAGNOSIS — I10 Essential (primary) hypertension: Secondary | ICD-10-CM | POA: Diagnosis not present

## 2023-08-30 DIAGNOSIS — G4739 Other sleep apnea: Secondary | ICD-10-CM | POA: Diagnosis not present

## 2023-08-30 DIAGNOSIS — I700 Atherosclerosis of aorta: Secondary | ICD-10-CM | POA: Diagnosis not present

## 2023-08-30 DIAGNOSIS — E7849 Other hyperlipidemia: Secondary | ICD-10-CM | POA: Diagnosis not present

## 2023-09-01 DIAGNOSIS — M25552 Pain in left hip: Secondary | ICD-10-CM | POA: Diagnosis not present

## 2023-09-01 DIAGNOSIS — M47816 Spondylosis without myelopathy or radiculopathy, lumbar region: Secondary | ICD-10-CM | POA: Diagnosis not present

## 2023-09-01 DIAGNOSIS — M1612 Unilateral primary osteoarthritis, left hip: Secondary | ICD-10-CM | POA: Diagnosis not present

## 2023-09-06 DIAGNOSIS — M79675 Pain in left toe(s): Secondary | ICD-10-CM | POA: Diagnosis not present

## 2023-09-06 DIAGNOSIS — L110 Acquired keratosis follicularis: Secondary | ICD-10-CM | POA: Diagnosis not present

## 2023-09-06 DIAGNOSIS — I739 Peripheral vascular disease, unspecified: Secondary | ICD-10-CM | POA: Diagnosis not present

## 2023-09-06 DIAGNOSIS — M79671 Pain in right foot: Secondary | ICD-10-CM | POA: Diagnosis not present

## 2023-09-06 DIAGNOSIS — M79672 Pain in left foot: Secondary | ICD-10-CM | POA: Diagnosis not present

## 2023-09-06 DIAGNOSIS — M79674 Pain in right toe(s): Secondary | ICD-10-CM | POA: Diagnosis not present

## 2023-09-21 ENCOUNTER — Other Ambulatory Visit: Payer: Self-pay | Admitting: Cardiology

## 2023-09-21 IMAGING — US US CAROTID DUPLEX BILAT
1 series · 13 of 24 positions shown · non-contrast
Comparison: MRI/MRA brain 11/04/2020

CLINICAL DATA: Cerebrovascular accident due to embolism of the
right MCA

EXAM:
BILATERAL CAROTID DUPLEX ULTRASOUND
TECHNIQUE: Gray scale imaging, color Doppler and duplex ultrasound were
performed of bilateral carotid and vertebral arteries in the neck.

[Series 1: us carotid bilateral · 13 of 78 slices shown]
[im 1/78]
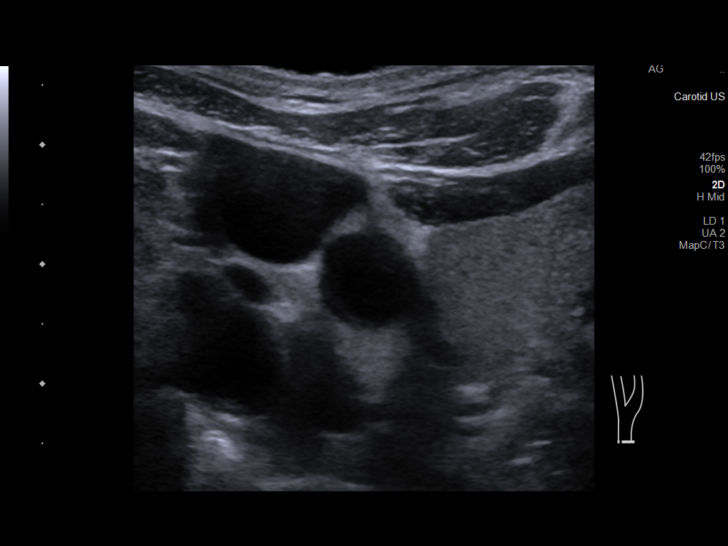
[im 7/78]
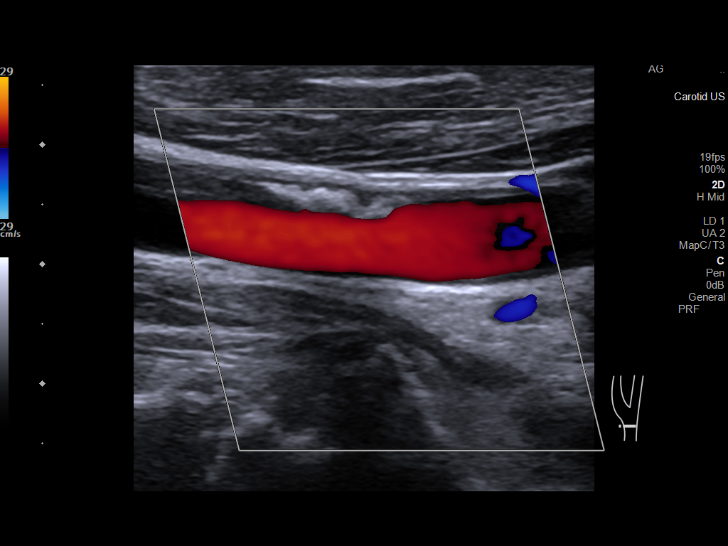
[im 14/78]
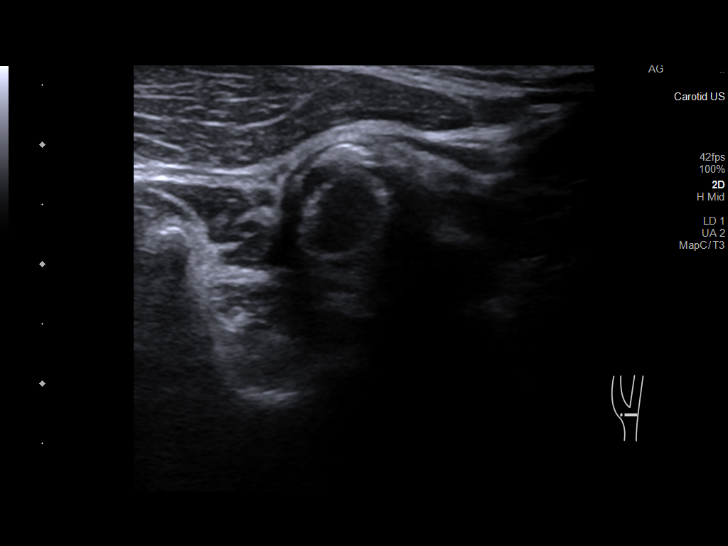
[im 21/78]
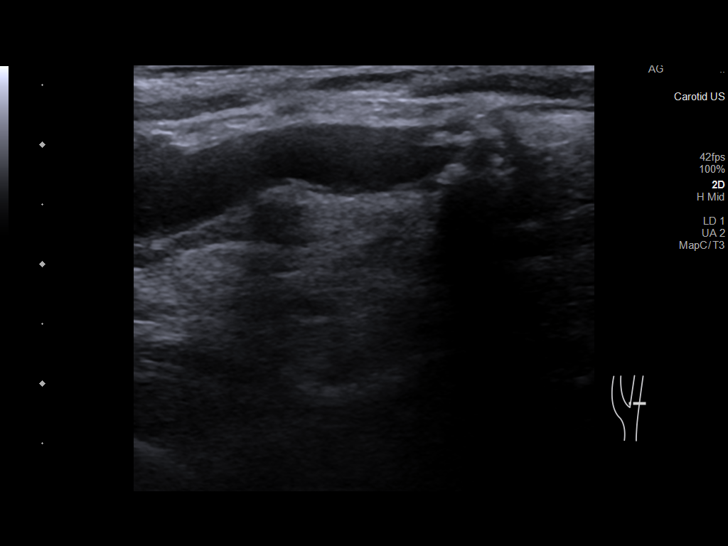
[im 27/78]
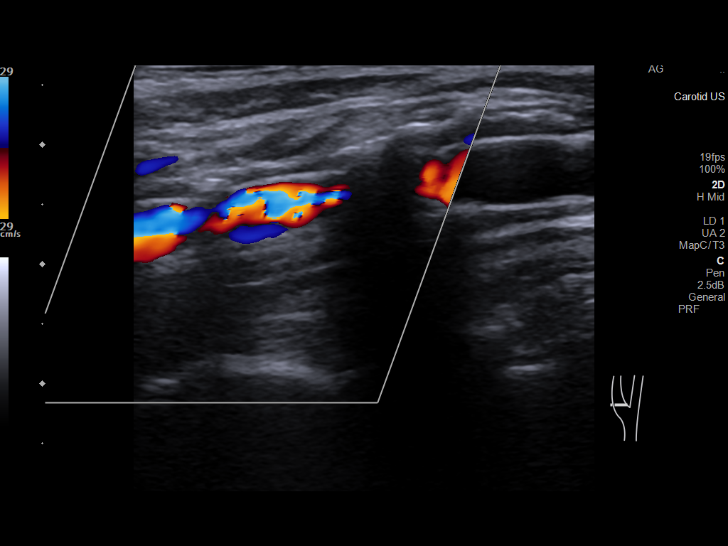
[im 34/78]
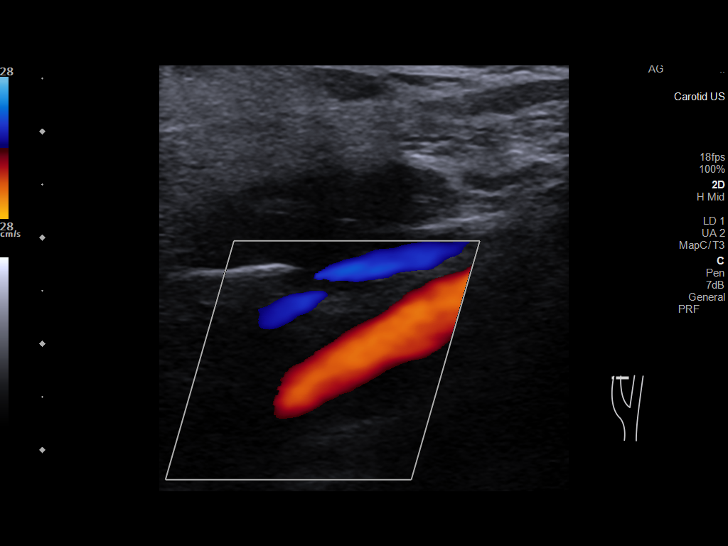
[im 41/78]
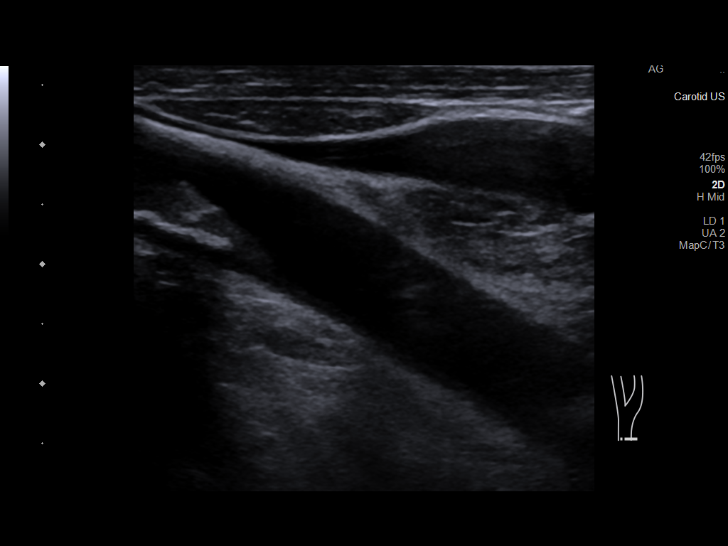
[im 44/78]
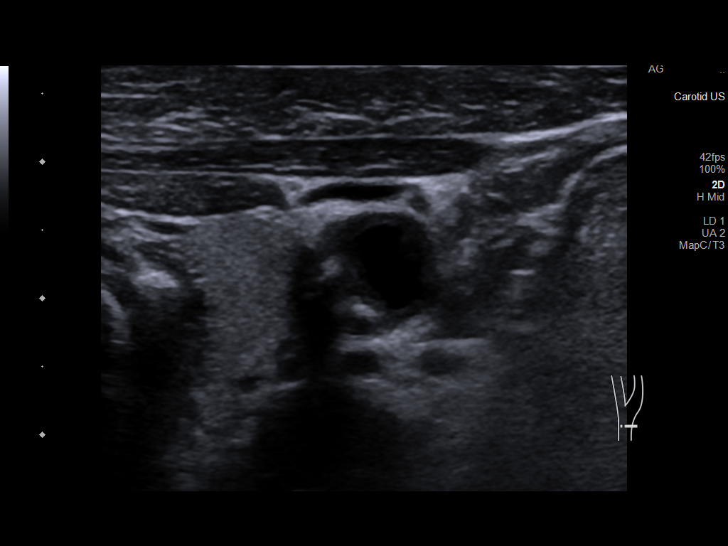
[im 51/78]
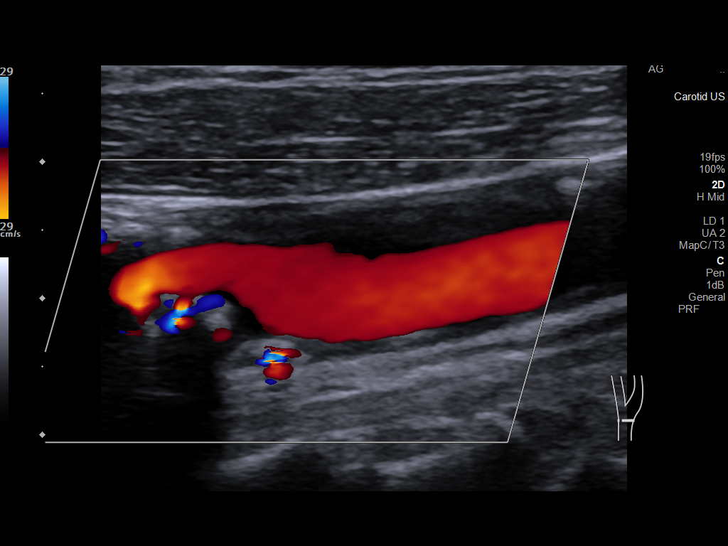
[im 57/78]
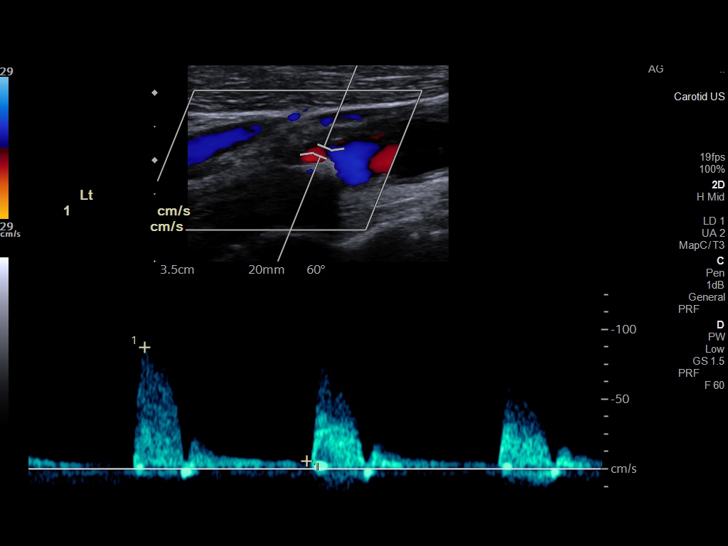
[im 64/78]
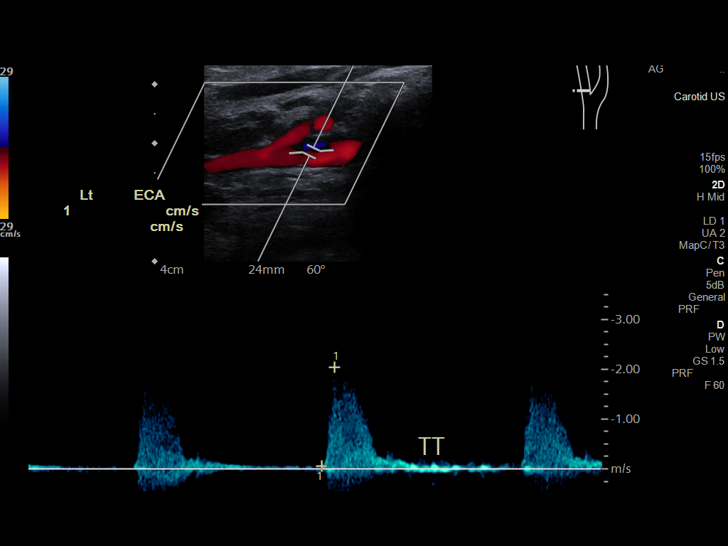
[im 71/78]
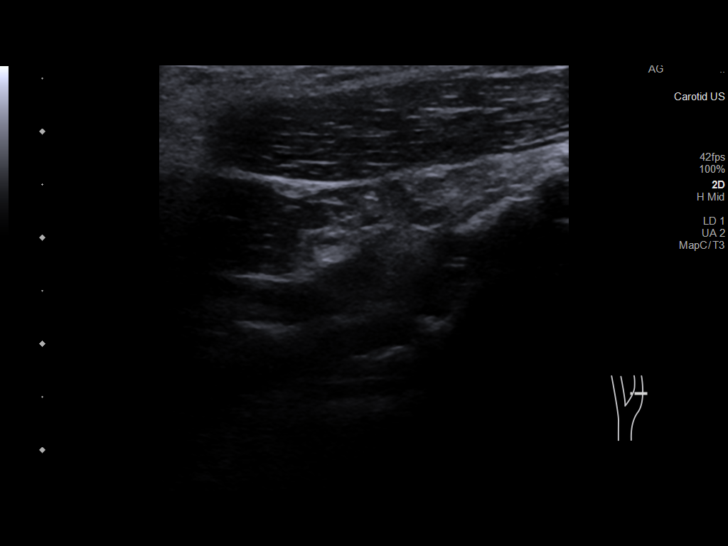
[im 78/78]
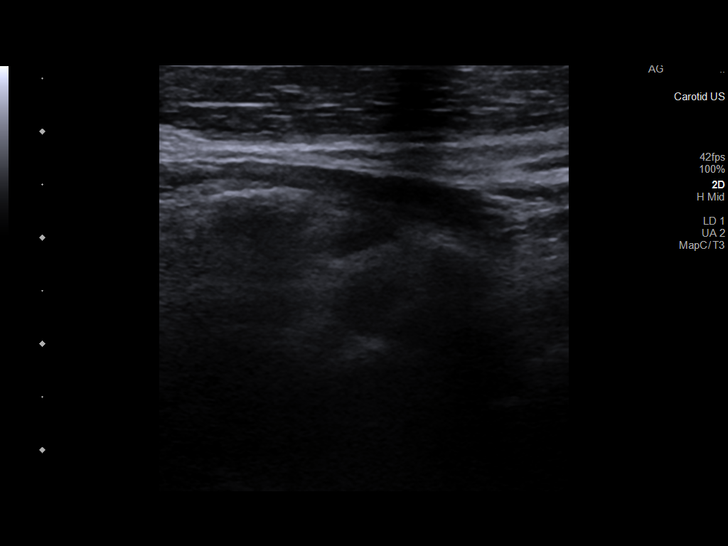

[13 of 24 positions shown; findings below may reference images not displayed]

FINDINGS: Criteria: Quantification of carotid stenosis is based on velocity
parameters that correlate the residual internal carotid diameter
with NASCET-based stenosis levels, using the diameter of the distal
internal carotid lumen as the denominator for stenosis measurement.

The following velocity measurements were obtained:

RIGHT
ICA: 217/44 cm/sec
CCA: 108/14 cm/sec

SYSTOLIC ICA/CCA RATIO:

ECA:  402 cm/sec

LEFT

ICA: 150/30 cm/sec

CCA: 95/3 cm/sec

SYSTOLIC ICA/CCA RATIO:

ECA:  204 cm/sec

RIGHT CAROTID ARTERY: Widely patent stent present within the
internal carotid artery. No evidence of edge or in stent stenosis.
Significantly elevated peak systolic velocity at the origin of the
external carotid artery consistent with greater than 70% stenosis.

RIGHT VERTEBRAL ARTERY:  Patent with antegrade flow.

LEFT CAROTID ARTERY: Moderate heterogeneous atherosclerotic plaque
throughout the internal carotid artery. By peak systolic velocity
criteria, the estimated stenosis falls in the 50-69% diameter range.

LEFT VERTEBRAL ARTERY:  Patent with normal antegrade flow.
IMPRESSION: 1. Widely patent right carotid stent without evidence of in-stent or
edge stenosis.
2. Moderate (50-69%) stenosis proximal left internal carotid artery
secondary to bulky heterogeneous atherosclerotic plaque. A suspected
degree of stenosis is underestimated by ultrasound imaging given the
plaque morphology on prior cerebral arteriogram.
3. Both vertebral arteries are patent with normal antegrade flow.

## 2023-09-21 NOTE — Telephone Encounter (Signed)
 Prescription refill request for Eliquis  received. Indication:afib Last office visit:4/25 Scr:0.88  2025 Age: 68 Weight:94  kg  Prescription refilled

## 2023-09-27 DIAGNOSIS — I5022 Chronic systolic (congestive) heart failure: Secondary | ICD-10-CM | POA: Diagnosis not present

## 2023-09-27 DIAGNOSIS — I10 Essential (primary) hypertension: Secondary | ICD-10-CM | POA: Diagnosis not present

## 2023-10-15 NOTE — Care Management (Signed)
 Erroneous encounter

## 2023-10-27 NOTE — Progress Notes (Signed)
 Remote ICD Transmission

## 2023-11-02 ENCOUNTER — Ambulatory Visit: Payer: Medicare Other

## 2023-11-02 DIAGNOSIS — I255 Ischemic cardiomyopathy: Secondary | ICD-10-CM

## 2023-11-03 LAB — CUP PACEART REMOTE DEVICE CHECK
Battery Remaining Longevity: 96 mo
Battery Voltage: 3.01 V
Brady Statistic AP VP Percent: 0.15 %
Brady Statistic AP VS Percent: 67.57 %
Brady Statistic AS VP Percent: 0.01 %
Brady Statistic AS VS Percent: 32.28 %
Brady Statistic RA Percent Paced: 67.31 %
Brady Statistic RV Percent Paced: 0.15 %
Date Time Interrogation Session: 20251014043822
HighPow Impedance: 60 Ohm
Implantable Lead Connection Status: 753985
Implantable Lead Connection Status: 753985
Implantable Lead Implant Date: 20230714
Implantable Lead Implant Date: 20230714
Implantable Lead Location: 753859
Implantable Lead Location: 753860
Implantable Lead Model: 5076
Implantable Pulse Generator Implant Date: 20230714
Lead Channel Impedance Value: 342 Ohm
Lead Channel Impedance Value: 361 Ohm
Lead Channel Impedance Value: 399 Ohm
Lead Channel Pacing Threshold Amplitude: 0.5 V
Lead Channel Pacing Threshold Amplitude: 0.75 V
Lead Channel Pacing Threshold Pulse Width: 0.4 ms
Lead Channel Pacing Threshold Pulse Width: 0.4 ms
Lead Channel Sensing Intrinsic Amplitude: 19.5 mV
Lead Channel Sensing Intrinsic Amplitude: 19.5 mV
Lead Channel Sensing Intrinsic Amplitude: 4.375 mV
Lead Channel Sensing Intrinsic Amplitude: 4.375 mV
Lead Channel Setting Pacing Amplitude: 2 V
Lead Channel Setting Pacing Amplitude: 2.5 V
Lead Channel Setting Pacing Pulse Width: 0.4 ms
Lead Channel Setting Sensing Sensitivity: 0.3 mV
Zone Setting Status: 755011

## 2023-11-04 ENCOUNTER — Ambulatory Visit: Payer: Self-pay | Admitting: Internal Medicine

## 2023-11-05 NOTE — Progress Notes (Signed)
 Remote ICD Transmission

## 2023-11-17 ENCOUNTER — Ambulatory Visit: Attending: Cardiology | Admitting: Cardiology

## 2023-11-17 ENCOUNTER — Encounter: Payer: Self-pay | Admitting: Cardiology

## 2023-11-17 VITALS — BP 140/88 | HR 71 | Ht 72.0 in | Wt 214.2 lb

## 2023-11-17 DIAGNOSIS — E782 Mixed hyperlipidemia: Secondary | ICD-10-CM

## 2023-11-17 DIAGNOSIS — I6523 Occlusion and stenosis of bilateral carotid arteries: Secondary | ICD-10-CM

## 2023-11-17 DIAGNOSIS — I25119 Atherosclerotic heart disease of native coronary artery with unspecified angina pectoris: Secondary | ICD-10-CM | POA: Diagnosis not present

## 2023-11-17 DIAGNOSIS — I5020 Unspecified systolic (congestive) heart failure: Secondary | ICD-10-CM

## 2023-11-17 DIAGNOSIS — I480 Paroxysmal atrial fibrillation: Secondary | ICD-10-CM

## 2023-11-17 NOTE — Progress Notes (Signed)
 Cardiology Office Note  Date: 11/17/2023   ID: [name], [name] 23-Jun-1955, MRN [id number]  History of Present Illness: Christopher Lang is a 68 y.o. male last seen in April.  He is here for a routine visit.  Reports NYHA class I dyspnea, no fluid retention, no exertional chest pain.  Medtronic ICD in place with follow-up by Dr. Waddell.  Device check in October shows normal function.  He has had no device shocks or syncope.  We went over his medications, he reports compliance with therapy.  Blood pressure elevated this morning as he had not yet taken his morning medications.  We discussed the results of his echocardiogram from May showing relatively stable LVEF in the range of 35 to 40%.  I reviewed his ECG today which shows an atrial paced rhythm with old anterior infarct pattern.  Physical Exam: VS:  BP (!) 140/88 Comment: has not taken monring medications yet  Pulse 71   Ht 6' (1.829 m)   Wt 214 lb 3.2 oz (97.2 kg)   SpO2 98%   BMI 29.05 kg/m , BMI Body mass index is 29.05 kg/m.  Wt Readings from Last 3 Encounters:  11/17/23 214 lb 3.2 oz (97.2 kg)  05/18/23 207 lb 3.2 oz (94 kg)  01/26/23 210 lb 3.2 oz (95.3 kg)    General: Patient appears comfortable at rest. HEENT: Conjunctiva and lids normal. Neck: Supple, no elevated JVP or carotid bruits. Lungs: Clear to auscultation, nonlabored breathing at rest. Cardiac: Regular rate and rhythm, no S3 or significant systolic murmur. Extremities: No pitting edema.  ECG:  An ECG dated 09/23/2022 was personally reviewed today and demonstrated:  Sinus rhythm with old anterior infarct pattern and nonspecific ST changes.  Labwork:  May 2025: BUN 11, creatinine 0.88, potassium 4.4, AST 30, ALT 22, cholesterol 101, triglycerides 101, HDL 38, LDL 44, NT-proBNP 747  Other Studies Reviewed Today:  Echocardiogram 06/03/2023:  1. No LV thrombus by Definity . Left ventricular ejection fraction, by  estimation, is 35 to 40%. The left  ventricle has moderately decreased  function. The left ventricle demonstrates regional wall motion  abnormalities (see scoring diagram/findings for  description). Left ventricular diastolic parameters are consistent with  Grade I diastolic dysfunction (impaired relaxation). Elevated left  ventricular end-diastolic pressure.   2. Right ventricular systolic function is normal. The right ventricular  size is normal. There is normal pulmonary artery systolic pressure.   3. The mitral valve is abnormal. Mild mitral valve regurgitation. No  evidence of mitral stenosis. Moderate mitral annular calcification.   4. The aortic valve is tricuspid. There is mild calcification of the  aortic valve. Aortic valve regurgitation is mild. No aortic stenosis is  present.   5. The inferior vena cava is normal in size with greater than 50%  respiratory variability, suggesting right atrial pressure of 3 mmHg.   Assessment and Plan:  1.  HFrEF with ischemic cardiomyopathy, LVEF 33% by cardiac MRI in April 2023 with nonviable inferior infarct scar.  Follow-up echocardiogram in May showed LVEF 35 to 40% and normal RV contraction.  Symptomatically stable with NYHA class I dyspnea and no fluid retention.  Continue Toprol -XL 50 mg daily, Entresto  97/103 mg twice daily, Aldactone  25 mg daily, and Farxiga  10 mg daily.  I reviewed his lab work from May.  Renal function normal at that time.   2.  Paroxysmal atrial fibrillation with CHA2DS2-VASc score of 6.  No interval palpitations.  Continue Eliquis  5 mg twice daily  for stroke prophylaxis.   3.  Medtronic ICD in place with follow-up by Dr. Waddell.  No device shocks or syncope.   4.  Bilateral carotid artery disease status post right ICA stent intervention.  Carotid Dopplers in January indicated 40 to 59% RICA stenosis and 60 to 79% LICA stenosis.  He remains on Plavix  75 mg daily.   5.  CAD with history of previous anterior wall infarct and October 2022 managed medically  (invasive testing not pursued at that point since he had received TNK in the setting of stroke).  No angina.  Continue statin therapy.   6.  Mixed hyperlipidemia, LDL 44 in May.  Continue Lipitor 40 mg daily and omega-3 supplements 1000 mg twice daily.  Disposition:  Follow up 6 months.  Signed, Jayson JUDITHANN Sierras, M.D., F.A.C.C. [facility] at [facility]

## 2023-11-17 NOTE — Patient Instructions (Signed)

## 2023-11-22 DIAGNOSIS — R310 Gross hematuria: Secondary | ICD-10-CM | POA: Diagnosis not present

## 2024-01-24 ENCOUNTER — Other Ambulatory Visit: Payer: Self-pay | Admitting: Cardiology

## 2024-01-28 ENCOUNTER — Encounter: Payer: Self-pay | Admitting: Student in an Organized Health Care Education/Training Program

## 2024-01-28 ENCOUNTER — Ambulatory Visit
Attending: Student in an Organized Health Care Education/Training Program | Admitting: Student in an Organized Health Care Education/Training Program

## 2024-01-28 VITALS — BP 128/68 | HR 84 | Ht 72.0 in | Wt 216.0 lb

## 2024-01-28 DIAGNOSIS — I480 Paroxysmal atrial fibrillation: Secondary | ICD-10-CM | POA: Diagnosis not present

## 2024-01-28 DIAGNOSIS — Z95810 Presence of automatic (implantable) cardiac defibrillator: Secondary | ICD-10-CM | POA: Diagnosis not present

## 2024-01-28 LAB — CUP PACEART INCLINIC DEVICE CHECK
Date Time Interrogation Session: 20260109173006
Implantable Lead Connection Status: 753985
Implantable Lead Connection Status: 753985
Implantable Lead Implant Date: 20230714
Implantable Lead Implant Date: 20230714
Implantable Lead Location: 753859
Implantable Lead Location: 753860
Implantable Lead Model: 5076
Implantable Pulse Generator Implant Date: 20230714

## 2024-01-28 NOTE — Patient Instructions (Signed)
 Medication Instructions:  Your physician recommends that you continue on your current medications as directed. Please refer to the Current Medication list given to you today.  *If you need a refill on your cardiac medications before your next appointment, please call your pharmacy*  Lab Work: None ordered.  If you have labs (blood work) drawn today and your tests are completely normal, you will receive your results only by: MyChart Message (if you have MyChart) OR A paper copy in the mail If you have any lab test that is abnormal or we need to change your treatment, we will call you to review the results.  Testing/Procedures: None ordered.   Follow-Up: At [facility], you and your health needs are our priority.  As part of our continuing mission to provide you with exceptional heart care, our providers are all part of one team.  This team includes your primary Cardiologist (physician) and Advanced Practice Providers or APPs (Physician Assistants and Nurse Practitioners) who all work together to provide you with the care you need, when you need it.  Your next appointment:   2 years with dr Al[place]

## 2024-01-28 NOTE — Progress Notes (Unsigned)
 " Cardiology Office Note   Date: 01/28/24 ID:  Christopher Lang, DOB [birth year], MRN [id number] PCP: Orpha Yancey LABOR, MD  [facility] Providers Cardiologist:  Jayson Sierras, MD Electrophysiologist:  Donnice LABOR Primus, MD    History of Present Illness Christopher Lang is a 69 y.o. male with CAD, prior anterior MI, NYHA class II sx, LVEF 33% s/p 1' prevention MDT DC ICD, paroxysmal AF, COPD, and carotid artery disease who presents for device management.  Discussed the use of AI scribe software for clinical note transcription with the patient, who gave verbal consent to proceed.  History of Present Illness Christopher Lang is a 69 year old male with a pacemaker and defibrillator who presents for a routine follow-up. He is accompanied by Lorenza, his companion. He was referred by Dr. Sierras for follow-up on his cardiac devices.  No issues with the pacemaker and defibrillator have been reported. He has not experienced any shocks or malfunctions, and the device has over seven years of battery life remaining.  He is on blood thinners, specifically apixaban  and clopidogrel , which he has been taking for three years following a stent placement on his right side. He notes narrowing on the left side that was too clogged for balloon angioplasty. No issues with these medications have been reported.  He mentions a skin concern, describing a spot that started red and turned brown, located in the middle of his chest. It is bothersome, but he has not pursued further evaluation yet.  He lives in [place] and has lived in the area for a long time.  ROS: none   Studies Reviewed  TTE Result date: 06/03/23  1. No LV thrombus by Definity . Left ventricular ejection fraction, by  estimation, is 35 to 40%. The left ventricle has moderately decreased  function. The left ventricle demonstrates regional wall motion  abnormalities (see scoring diagram/findings for  description). Left ventricular  diastolic parameters are consistent with  Grade I diastolic dysfunction (impaired relaxation). Elevated left  ventricular end-diastolic pressure.   2. Right ventricular systolic function is normal. The right ventricular  size is normal. There is normal pulmonary artery systolic pressure.   3. The mitral valve is abnormal. Mild mitral valve regurgitation. No  evidence of mitral stenosis. Moderate mitral annular calcification.   4. The aortic valve is tricuspid. There is mild calcification of the  aortic valve. Aortic valve regurgitation is mild. No aortic stenosis is  present.   5. The inferior vena cava is normal in size with greater than 50%  respiratory variability, suggesting right atrial pressure of 3 mmHg.   Risk Assessment/Calculations  CHA2DS2-VASc Score = 3  This indicates a 3.2% annual risk of stroke. The patient's score is based upon: CHF History: 1 HTN History: 0 Diabetes History: 0 Stroke History: 0 Vascular Disease History: 1 Age Score: 1 Gender Score: 0  Physical Exam VS:  BP 128/68 (BP Location: Left Arm, Cuff Size: Large)   Pulse 84   Ht 6' (1.829 m)   Wt 216 lb (98 kg)   SpO2 95%   BMI 29.29 kg/m   Wt Readings from Last 3 Encounters:  01/28/24 216 lb (98 kg)  11/17/23 214 lb 3.2 oz (97.2 kg)  05/18/23 207 lb 3.2 oz (94 kg)    GEN: Well nourished, well developed in no acute distress CARDIAC: RRR, no murmurs, rubs, gallops RESPIRATORY:  Clear to auscultation without rales, wheezing or rhonchi  EXTREMITIES:  No edema; No deformity  SKIN: LEFT infraclavicular  incision well healed, device site without erosion or erythema   ASSESSMENT AND PLAN Christopher Lang is a 69 y.o. male with CAD, prior anterior MI, NYHA class II sx, LVEF 33% s/p 1' prevention MDT DC ICD, paroxysmal AF, COPD, and carotid artery disease who presents for device management. Assessment & Plan LEFT MDT DC ICD, 1' prevention  Automatic implantable cardioverter-defibrillator (AICD)  management AICD functioning well with over seven years of battery life remaining. Remote monitoring every three months shows no issues. - Scheduled follow-up for AICD check in two years unless issues arise. - Continue remote monitoring every three months.  Coronary artery disease with prior stent placement Carotid artery disease with prior stent placement on the right side and narrowing on the left side. Currently on dual AP/OAC therapy with apixaban  and clopidogrel . No issues with medication adherence or side effects. Reviewed that he is on both of these with carotid artery disease as well as pAF.  - Continue dual tx with apixaban  and clopidogrel  - Coordinate follow-up appointment with Dr. Debera within six to eight months.  Dispo: RTC 2 years   Signed, Donnice DELENA Primus, MD  "

## 2024-01-29 ENCOUNTER — Ambulatory Visit: Payer: Self-pay | Admitting: Student in an Organized Health Care Education/Training Program

## 2024-02-01 ENCOUNTER — Ambulatory Visit: Payer: Medicare Other

## 2024-02-01 DIAGNOSIS — I255 Ischemic cardiomyopathy: Secondary | ICD-10-CM | POA: Diagnosis not present

## 2024-02-02 ENCOUNTER — Ambulatory Visit: Payer: Self-pay | Admitting: Student in an Organized Health Care Education/Training Program

## 2024-02-02 LAB — CUP PACEART REMOTE DEVICE CHECK
Battery Remaining Longevity: 93 mo
Battery Voltage: 3.01 V
Brady Statistic AP VP Percent: 0.05 %
Brady Statistic AP VS Percent: 71.52 %
Brady Statistic AS VP Percent: 0.01 %
Brady Statistic AS VS Percent: 28.42 %
Brady Statistic RA Percent Paced: 71.51 %
Brady Statistic RV Percent Paced: 0.06 %
Date Time Interrogation Session: 20260113022705
HighPow Impedance: 60 Ohm
Implantable Lead Connection Status: 753985
Implantable Lead Connection Status: 753985
Implantable Lead Implant Date: 20230714
Implantable Lead Implant Date: 20230714
Implantable Lead Location: 753859
Implantable Lead Location: 753860
Implantable Lead Model: 5076
Implantable Pulse Generator Implant Date: 20230714
Lead Channel Impedance Value: 342 Ohm
Lead Channel Impedance Value: 399 Ohm
Lead Channel Impedance Value: 418 Ohm
Lead Channel Pacing Threshold Amplitude: 0.5 V
Lead Channel Pacing Threshold Amplitude: 0.625 V
Lead Channel Pacing Threshold Pulse Width: 0.4 ms
Lead Channel Pacing Threshold Pulse Width: 0.4 ms
Lead Channel Sensing Intrinsic Amplitude: 16 mV
Lead Channel Sensing Intrinsic Amplitude: 16 mV
Lead Channel Sensing Intrinsic Amplitude: 3.75 mV
Lead Channel Sensing Intrinsic Amplitude: 3.75 mV
Lead Channel Setting Pacing Amplitude: 2 V
Lead Channel Setting Pacing Amplitude: 2.5 V
Lead Channel Setting Pacing Pulse Width: 0.4 ms
Lead Channel Setting Sensing Sensitivity: 0.3 mV
Zone Setting Status: 755011

## 2024-02-04 NOTE — Progress Notes (Signed)
 Remote ICD Transmission

## 2024-05-02 ENCOUNTER — Ambulatory Visit: Payer: Medicare Other

## 2024-05-22 ENCOUNTER — Ambulatory Visit: Admitting: Cardiology

## 2024-08-01 ENCOUNTER — Ambulatory Visit: Payer: Medicare Other

## 2024-10-31 ENCOUNTER — Ambulatory Visit: Payer: Medicare Other

## 2025-01-30 ENCOUNTER — Ambulatory Visit: Payer: Medicare Other
